# Patient Record
Sex: Female | Born: 1990 | Race: Black or African American | Hispanic: No | Marital: Single | Smoking: Never smoker
Health system: Southern US, Community
[De-identification: ages and names within clinical notes are randomized; demographics above are authoritative.]

## PROBLEM LIST (undated history)

## (undated) ENCOUNTER — Ambulatory Visit (HOSPITAL_COMMUNITY): Admission: EM | Payer: Medicaid Other | Source: Home / Self Care

## (undated) ENCOUNTER — Ambulatory Visit (HOSPITAL_COMMUNITY): Admission: EM | Source: Home / Self Care

## (undated) ENCOUNTER — Inpatient Hospital Stay (HOSPITAL_COMMUNITY): Payer: Self-pay

## (undated) ENCOUNTER — Ambulatory Visit: Source: Home / Self Care

## (undated) ENCOUNTER — Emergency Department (HOSPITAL_BASED_OUTPATIENT_CLINIC_OR_DEPARTMENT_OTHER): Admission: EM | Payer: Medicaid Other | Source: Home / Self Care

## (undated) DIAGNOSIS — E876 Hypokalemia: Secondary | ICD-10-CM

## (undated) DIAGNOSIS — B9689 Other specified bacterial agents as the cause of diseases classified elsewhere: Secondary | ICD-10-CM

## (undated) DIAGNOSIS — A549 Gonococcal infection, unspecified: Secondary | ICD-10-CM

## (undated) DIAGNOSIS — Z9884 Bariatric surgery status: Secondary | ICD-10-CM

## (undated) DIAGNOSIS — R6889 Other general symptoms and signs: Secondary | ICD-10-CM

## (undated) DIAGNOSIS — K117 Disturbances of salivary secretion: Secondary | ICD-10-CM

## (undated) DIAGNOSIS — N390 Urinary tract infection, site not specified: Secondary | ICD-10-CM

## (undated) DIAGNOSIS — R002 Palpitations: Secondary | ICD-10-CM

## (undated) DIAGNOSIS — Z3490 Encounter for supervision of normal pregnancy, unspecified, unspecified trimester: Secondary | ICD-10-CM

## (undated) DIAGNOSIS — Z6841 Body Mass Index (BMI) 40.0 and over, adult: Secondary | ICD-10-CM

## (undated) DIAGNOSIS — O09899 Supervision of other high risk pregnancies, unspecified trimester: Secondary | ICD-10-CM

## (undated) DIAGNOSIS — A749 Chlamydial infection, unspecified: Secondary | ICD-10-CM

## (undated) DIAGNOSIS — N83209 Unspecified ovarian cyst, unspecified side: Secondary | ICD-10-CM

## (undated) DIAGNOSIS — G35 Multiple sclerosis: Secondary | ICD-10-CM

## (undated) DIAGNOSIS — R42 Dizziness and giddiness: Secondary | ICD-10-CM

## (undated) DIAGNOSIS — R200 Anesthesia of skin: Secondary | ICD-10-CM

## (undated) DIAGNOSIS — Z658 Other specified problems related to psychosocial circumstances: Secondary | ICD-10-CM

## (undated) DIAGNOSIS — N760 Acute vaginitis: Secondary | ICD-10-CM

## (undated) DIAGNOSIS — E669 Obesity, unspecified: Secondary | ICD-10-CM

## (undated) DIAGNOSIS — Z2839 Other underimmunization status: Secondary | ICD-10-CM

## (undated) HISTORY — DX: Disturbances of salivary secretion: K11.7

## (undated) HISTORY — DX: Hypokalemia: E87.6

## (undated) HISTORY — DX: Body Mass Index (BMI) 40.0 and over, adult: Z684

## (undated) HISTORY — PX: WISDOM TOOTH EXTRACTION: SHX21

## (undated) HISTORY — DX: Other specified problems related to psychosocial circumstances: Z65.8

## (undated) HISTORY — DX: Other underimmunization status: Z28.39

## (undated) HISTORY — DX: Anesthesia of skin: R20.0

## (undated) HISTORY — DX: Dizziness and giddiness: R42

## (undated) HISTORY — DX: Unspecified ovarian cyst, unspecified side: N83.209

## (undated) HISTORY — DX: Other general symptoms and signs: R68.89

## (undated) HISTORY — DX: Bariatric surgery status: Z98.84

## (undated) HISTORY — DX: Encounter for supervision of normal pregnancy, unspecified, unspecified trimester: Z34.90

## (undated) HISTORY — DX: Palpitations: R00.2

## (undated) HISTORY — DX: Supervision of other high risk pregnancies, unspecified trimester: O09.899

## (undated) HISTORY — PX: GASTRIC BYPASS: SHX52

## (undated) HISTORY — DX: Multiple sclerosis: G35

---
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
## 2011-02-07 LAB — URINE MICROSCOPIC-ADD ON

## 2011-02-07 LAB — CBC
Hemoglobin: 13.2
MCHC: 33.9
Platelets: 249
RDW: 13

## 2011-02-07 LAB — RAPID STREP SCREEN (MED CTR MEBANE ONLY): Streptococcus, Group A Screen (Direct): NEGATIVE

## 2011-02-07 LAB — INFLUENZA A+B VIRUS AG-DIRECT(RAPID)
Inflenza A Ag: NEGATIVE
Influenza B Ag: NEGATIVE

## 2011-02-07 LAB — URINALYSIS, ROUTINE W REFLEX MICROSCOPIC
Glucose, UA: NEGATIVE
Hgb urine dipstick: NEGATIVE
Ketones, ur: 40 — AB
Protein, ur: NEGATIVE
pH: 5.5

## 2011-02-07 LAB — I-STAT 8, (EC8 V) (CONVERTED LAB)
Acid-base deficit: 2
Bicarbonate: 21.6
HCT: 42
Hemoglobin: 14.3
Operator id: 234501
Potassium: 3.7
Sodium: 138
TCO2: 23

## 2011-02-07 LAB — POCT PREGNANCY, URINE
Operator id: 294501
Preg Test, Ur: NEGATIVE

## 2011-02-25 LAB — URINE MICROSCOPIC-ADD ON

## 2011-02-25 LAB — GC/CHLAMYDIA PROBE AMP, GENITAL
Chlamydia, DNA Probe: NEGATIVE
GC Probe Amp, Genital: NEGATIVE

## 2011-02-25 LAB — URINALYSIS, ROUTINE W REFLEX MICROSCOPIC
Bilirubin Urine: NEGATIVE
Ketones, ur: NEGATIVE
Nitrite: NEGATIVE
Specific Gravity, Urine: 1.014
Urobilinogen, UA: 1
pH: 8

## 2011-02-25 LAB — POCT PREGNANCY, URINE
Operator id: 146091
Preg Test, Ur: NEGATIVE

## 2011-02-25 LAB — WET PREP, GENITAL: Yeast Wet Prep HPF POC: NONE SEEN

## 2011-05-22 ENCOUNTER — Emergency Department (INDEPENDENT_AMBULATORY_CARE_PROVIDER_SITE_OTHER)
Admission: EM | Admit: 2011-05-22 | Discharge: 2011-05-22 | Disposition: A | Discharge status: Home / Self Care | Payer: Self-pay | Source: Home / Self Care | Attending: Emergency Medicine | Admitting: Emergency Medicine

## 2011-05-22 ENCOUNTER — Encounter: Payer: Self-pay | Admitting: Cardiology

## 2011-05-22 DIAGNOSIS — J029 Acute pharyngitis, unspecified: Secondary | ICD-10-CM

## 2011-05-22 NOTE — ED Notes (Signed)
Pt reports sore throat for the past 2 days. Hurts to swallow. Face swelling noted in the morning. Pt states she has had this before and it is caused smoking too much. Has been smoking daily for the past 2 weeks.

## 2011-05-22 NOTE — ED Provider Notes (Signed)
History     CSN: [id number]  Arrival date & time 05/22/11  1047   First MD Initiated Contact with Patient 05/22/11 1052      Chief Complaint  Patient presents with  . Sore Throat    (Consider location/radiation/quality/duration/timing/severity/associated sxs/prior treatment) HPI Comments: Pt with ST x 2 days. Notes whitish material on left side. No rhinorrhea, N/V, rash,cough, wheeze, SOB, abd pain, ear pain, known sick contacts. Also c/o facial swelling in am. No eye complaints. Similar sx before when she was smoking. Started smoking daily over past 2 weeks. Has not tried anything for this.  Patient is a 21 y.o. female presenting with pharyngitis. The history is provided by the patient.  Sore Throat This is a new problem. The current episode started more than 2 days ago. The problem occurs constantly. The problem has not changed since onset.Pertinent negatives include no chest pain, no abdominal pain, no headaches and no shortness of breath. The symptoms are aggravated by swallowing. The symptoms are relieved by nothing. She has tried nothing for the symptoms.    History reviewed. No pertinent past medical history.  History reviewed. No pertinent past surgical history.  History reviewed. No pertinent family history.  History  Substance Use Topics  . Smoking status: Current Everyday Smoker    Types: Cigarettes  . Smokeless tobacco: Not on file  . Alcohol Use: No    OB History    Grav Para Term Preterm Abortions TAB SAB Ect Mult Living                  Review of Systems  Constitutional: Negative for fever.  HENT: Negative for congestion, rhinorrhea, mouth sores, trouble swallowing, neck pain and voice change.   Eyes: Negative.   Respiratory: Negative for shortness of breath and wheezing.   Cardiovascular: Negative for chest pain.  Gastrointestinal: Negative for nausea, vomiting and abdominal pain.  Neurological: Negative for headaches.    Allergies   Penicillins  Home Medications  No current outpatient prescriptions on file.  BP 119/70  Pulse 82  Temp(Src) 99.1 F (37.3 C) (Oral)  Resp 18  SpO2 100%  LMP 04/30/2011  Physical Exam  Nursing note and vitals reviewed. Constitutional: She is oriented to person, place, and time. She appears well-developed and well-nourished. No distress.  HENT:  Head: Normocephalic and atraumatic.  Right Ear: Tympanic membrane normal.  Left Ear: Tympanic membrane normal.  Nose: Mucosal edema and rhinorrhea present. Right sinus exhibits no maxillary sinus tenderness and no frontal sinus tenderness. Left sinus exhibits no maxillary sinus tenderness and no frontal sinus tenderness.  Mouth/Throat: Uvula is midline, oropharynx is clear and moist and mucous membranes are normal. No tonsillar abscesses.       Irritated, erythematous tonsils, tonsillith on L side which was removed with sterile qtip.   Eyes: EOM are normal. Pupils are equal, round, and reactive to light.  Neck: Normal range of motion.  Cardiovascular: Normal rate and regular rhythm.   Pulmonary/Chest: Effort normal and breath sounds normal.  Abdominal: She exhibits no distension.  Musculoskeletal: Normal range of motion.  Lymphadenopathy:       Shotty LN bilaterally  Neurological: She is alert and oriented to person, place, and time.  Skin: Skin is warm and dry.  Psychiatric: She has a normal mood and affect. Her behavior is normal. Judgment and thought content normal.    ED Course  Procedures (including critical care time)   Labs Reviewed  POCT RAPID STREP A [facility] CARE  ONLY)   No results found.   1. Pharyngitis       MDM  RAS neg. centor criteria 0/4. Most likely viral pharyngitis with further irritation with smoking. Advised pt to stop smoking. Pt states ready to quit.   Luiz Blare, MD 05/22/11 1155

## 2011-12-29 ENCOUNTER — Emergency Department (HOSPITAL_COMMUNITY)
Admission: EM | Admit: 2011-12-29 | Discharge: 2011-12-29 | Disposition: A | Discharge status: Home / Self Care | Payer: Self-pay | Attending: Emergency Medicine | Admitting: Emergency Medicine

## 2011-12-29 ENCOUNTER — Emergency Department (HOSPITAL_COMMUNITY): Payer: Self-pay

## 2011-12-29 ENCOUNTER — Encounter (HOSPITAL_COMMUNITY): Payer: Self-pay | Admitting: Emergency Medicine

## 2011-12-29 DIAGNOSIS — F17200 Nicotine dependence, unspecified, uncomplicated: Secondary | ICD-10-CM | POA: Insufficient documentation

## 2011-12-29 DIAGNOSIS — Z880 Allergy status to penicillin: Secondary | ICD-10-CM | POA: Insufficient documentation

## 2011-12-29 DIAGNOSIS — J069 Acute upper respiratory infection, unspecified: Secondary | ICD-10-CM | POA: Insufficient documentation

## 2011-12-29 DIAGNOSIS — Z881 Allergy status to other antibiotic agents status: Secondary | ICD-10-CM | POA: Insufficient documentation

## 2011-12-29 MED ORDER — GUAIFENESIN 100 MG/5ML PO LIQD: 100.0000 mg | ORAL | Status: AC | PRN

## 2011-12-29 MED ORDER — AZITHROMYCIN 250 MG PO TABS: 250.0000 mg | ORAL_TABLET | Freq: Every day | ORAL | Status: AC

## 2011-12-29 NOTE — ED Notes (Signed)
PT. REPORTS DRY COUGH WITH CHEST CONGESTION AND SOB FOR SEVERAL DAYS .

## 2011-12-29 NOTE — ED Notes (Signed)
Name called no answer X 1 

## 2011-12-29 NOTE — ED Notes (Signed)
Pt denies any questions upon discharge. 

## 2011-12-29 NOTE — ED Notes (Signed)
Pt called back but no answer from both triage tech and xray. Pt came up to nurse first desk to ask if she had been called. Xray called and notified

## 2011-12-29 NOTE — ED Notes (Signed)
Called to place in exam room, no answer x1.

## 2012-01-02 NOTE — ED Provider Notes (Signed)
History     CSN: [id number]  Arrival date & time 12/29/11  0107   First MD Initiated Contact with Patient 12/29/11 0216      Chief Complaint  Patient presents with  . Cough    (Consider location/radiation/quality/duration/timing/severity/associated sxs/prior treatment) Patient is a 21 y.o. female presenting with cough. The history is provided by the patient.  Cough This is a new problem. The current episode started more than 2 days ago. The problem occurs constantly. The problem has been gradually worsening. The cough is productive of purulent sputum. There has been no fever. Associated symptoms include chills and rhinorrhea. Pertinent negatives include no chest pain, no sweats, no weight loss, no ear pain, no sore throat, no shortness of breath, no wheezing and no eye redness. She has tried decongestants for the symptoms. The treatment provided mild relief. She is a smoker. Her past medical history is significant for pneumonia (txed for community acquired pna 2 yrs ago).    History reviewed. No pertinent past medical history.  History reviewed. No pertinent past surgical history.  No family history on file.  History  Substance Use Topics  . Smoking status: Current Everyday Smoker    Types: Cigarettes  . Smokeless tobacco: Not on file  . Alcohol Use: No    OB History    Grav Para Term Preterm Abortions TAB SAB Ect Mult Living                  Review of Systems  Constitutional: Positive for chills. Negative for fever, weight loss and unexpected weight change.  HENT: Positive for rhinorrhea. Negative for ear pain, sore throat, neck pain and neck stiffness.   Eyes: Negative for redness and visual disturbance.  Respiratory: Positive for cough. Negative for chest tightness, shortness of breath and wheezing.   Cardiovascular: Negative for chest pain, palpitations and leg swelling.  Gastrointestinal: Negative for nausea, vomiting and abdominal pain.  Neurological: Negative for  dizziness, syncope and weakness.    Allergies  Amoxicillin and Penicillins  Home Medications   Current Outpatient Rx  Name Route Sig Dispense Refill  . ASPIRIN-ACETAMINOPHEN-CAFFEINE 250-250-65 MG PO TABS Oral Take 2 tablets by mouth every 6 (six) hours as needed. toothache    . CARBOXYMETHYLCELLUL-GLYCERIN 1-0.25 % OP SOLN Both Eyes Place 1-2 drops into both eyes 3 (three) times daily as needed. Eye soreness and redness    . AZITHROMYCIN 250 MG PO TABS Oral Take 1 tablet (250 mg total) by mouth daily. Take first 2 tablets together, then 1 every day until finished. 6 tablet 0  . GUAIFENESIN 100 MG/5ML PO LIQD Oral Take 5-10 mLs (100-200 mg total) by mouth every 4 (four) hours as needed for cough. 60 mL 0    BP 120/77  Pulse 77  Temp 98.9 F (37.2 C) (Oral)  Resp 18  SpO2 100%  LMP 12/21/2011  Physical Exam  Nursing note and vitals reviewed. Constitutional: She appears well-developed and well-nourished. No distress.  HENT:  Head: Normocephalic and atraumatic.  Mouth/Throat: Oropharynx is clear and moist. No oropharyngeal exudate.       TMs nl bilat Sinuses non ttp  Eyes: EOM are normal. Pupils are equal, round, and reactive to light.  Neck: Normal range of motion. Neck supple.  Cardiovascular: Normal rate, regular rhythm and normal heart sounds.   Pulmonary/Chest: Effort normal and breath sounds normal. She exhibits no tenderness.  Abdominal: Soft. Bowel sounds are normal. There is no tenderness. There is no rebound and no guarding.  Musculoskeletal: Normal range of motion.  Lymphadenopathy:    She has no cervical adenopathy.  Neurological: She is alert.  Skin: Skin is warm and dry. She is not diaphoretic.  Psychiatric: She has a normal mood and affect.    ED Course  Procedures (including critical care time)  Labs Reviewed - No data to display No results found.   1. URI (upper respiratory infection)       MDM  Pt with hx pneumonia previously presents with  several days of productive cough. Reports that she feels similar to how she felt when dxed with pneumonia. Pt is nontoxic in appearance, is not hypoxic, tachypenic or tachycardic. CXR = mild bibasilar atelectasis. Will give coverage with zpack, antitussives. Reasons to return discussed.       Grant Fontana, PA-C 01/02/12 2125

## 2012-01-03 NOTE — ED Provider Notes (Signed)
Medical screening examination/treatment/procedure(s) were performed by non-physician practitioner and as supervising physician I was immediately available for consultation/collaboration.   Keyle Doby, MD 01/03/12 0655 

## 2012-01-19 ENCOUNTER — Emergency Department (INDEPENDENT_AMBULATORY_CARE_PROVIDER_SITE_OTHER)
Admission: EM | Admit: 2012-01-19 | Discharge: 2012-01-19 | Disposition: A | Discharge status: Home / Self Care | Payer: Self-pay | Source: Home / Self Care

## 2012-01-19 ENCOUNTER — Encounter (HOSPITAL_COMMUNITY): Payer: Self-pay | Admitting: Emergency Medicine

## 2012-01-19 DIAGNOSIS — N760 Acute vaginitis: Secondary | ICD-10-CM

## 2012-01-19 LAB — POCT URINALYSIS DIP (DEVICE)
Nitrite: NEGATIVE
Specific Gravity, Urine: 1.02 (ref 1.005–1.030)
Urobilinogen, UA: 0.2 mg/dL (ref 0.0–1.0)
pH: 6.5 (ref 5.0–8.0)

## 2012-01-19 LAB — WET PREP, GENITAL

## 2012-01-19 MED ORDER — METRONIDAZOLE 500 MG PO TABS: 500.0000 mg | ORAL_TABLET | Freq: Two times a day (BID) | ORAL | Status: AC

## 2012-01-19 NOTE — ED Provider Notes (Signed)
History     CSN: [id number]  Arrival date & time 01/19/12  1028   First MD Initiated Contact with Patient 01/19/12 1049      Chief Complaint  Patient presents with  . Vaginal Pain    vaginal pain and irritation    (Consider location/radiation/quality/duration/timing/severity/associated sxs/prior treatment) HPI Comments: C/o vaginal pain for 3-5 days, mild pelvic pain, mucosal irritation and odor. Denies vaginal discharge or itching. Feels iritated in vagina. No fever or chills, Denies GU sx's. No trauma. Sexually active.   History reviewed. No pertinent past medical history.  History reviewed. No pertinent past surgical history.  History reviewed. No pertinent family history.  History  Substance Use Topics  . Smoking status: Current Everyday Smoker    Types: Cigarettes  . Smokeless tobacco: Not on file  . Alcohol Use: No    OB History    Grav Para Term Preterm Abortions TAB SAB Ect Mult Living                  Review of Systems  Constitutional: Negative.   Respiratory: Negative.   Cardiovascular: Negative.   Gastrointestinal: Negative.   Genitourinary: Positive for vaginal discharge, genital sores, vaginal pain and pelvic pain. Negative for dysuria, frequency, hematuria, flank pain, difficulty urinating and menstrual problem.  Musculoskeletal: Negative.   Neurological: Negative.     Allergies  Amoxicillin and Penicillins  Home Medications   Current Outpatient Rx  Name Route Sig Dispense Refill  . ASPIRIN-ACETAMINOPHEN-CAFFEINE 250-250-65 MG PO TABS Oral Take 2 tablets by mouth every 6 (six) hours as needed. toothache    . CARBOXYMETHYLCELLUL-GLYCERIN 1-0.25 % OP SOLN Both Eyes Place 1-2 drops into both eyes 3 (three) times daily as needed. Eye soreness and redness    . METRONIDAZOLE 500 MG PO TABS Oral Take 1 tablet (500 mg total) by mouth 2 (two) times daily. X 7 days 14 tablet 0    BP 122/92  Pulse 72  Temp 98.7 F (37.1 C) (Oral)  Resp 16  SpO2 100%   LMP 12/21/2011  Physical Exam  Constitutional: She appears well-developed and well-nourished. No distress.  Neck: Normal range of motion. Neck supple.  Cardiovascular: Normal rate and normal heart sounds.   Pulmonary/Chest: Effort normal and breath sounds normal.  Abdominal: Soft. She exhibits no distension and no mass. There is no tenderness. There is no rebound and no guarding.  Genitourinary:       Redundant vaginal walls and cervix is positioned posteriorly beyond the length of the speculum. Could not capture it or visualize the cervix. Scant spinn and cream colored discharge. Tenderness in introitus and vagina. There are several 1mm papules on the mucosal side of labia minora. No vessicles or crops.   Skin: Skin is warm and dry.  Psychiatric: She has a normal mood and affect.    ED Course  Procedures (including critical care time)  Labs Reviewed  POCT URINALYSIS DIP (DEVICE) - Abnormal; Notable for the following:    Hgb urine dipstick TRACE (*)     Leukocytes, UA SMALL (*)  Biochemical Testing Only. Please order routine urinalysis from main lab if confirmatory testing is needed.   All other components within normal limits  WET PREP, GENITAL - Abnormal; Notable for the following:    Yeast Wet Prep HPF POC FEW (*)     Trich, Wet Prep FEW (*)     Clue Cells Wet Prep HPF POC MANY (*)     WBC, Wet Prep HPF POC MODERATE (*)  All other components within normal limits  POCT PREGNANCY, URINE  HERPES SIMPLEX VIRUS CULTURE   No results found.   1. Vaginitis and vulvovaginitis       MDM  Vaginitis instructions Flagyl 500 bid for 7 . Obtained swabs including herpes and wet prep.  Will call results and treat any ppositives. Nothing in vagina for 1 week.        Hayden Rasmussen, NP 01/19/12 1330  Hayden Rasmussen, NP 01/19/12 [phone]

## 2012-01-19 NOTE — ED Provider Notes (Signed)
Medical screening examination/treatment/procedure(s) were performed by non-physician practitioner and as supervising physician I was immediately available for consultation/collaboration.  Raynald Blend, MD 01/19/12 [phone]

## 2012-01-19 NOTE — ED Notes (Addendum)
Pt c/o vaginal pain/irritation/pain with intercourse x 3 to 5 days. Pt states she felt bumps inside of vagina, ? Out break.

## 2012-01-20 ENCOUNTER — Telehealth (HOSPITAL_COMMUNITY): Payer: Self-pay | Admitting: *Deleted

## 2012-01-20 NOTE — ED Notes (Signed)
Herpes culture pending, Wet prep: few yeast, few trich, many clue cells, mod. WBC's.  Pt. adequately treated with Flagyl.  I called and left a message for pt. to call. Vassie Moselle 01/20/2012

## 2012-01-20 NOTE — ED Notes (Signed)
Pt. called back and verified x 2. Pt. given her wet prep results and herpes was still pending.  Pt. told she was adequately treated.  You need to notify your partner to be treated with Flagyl, no sex until you have finished your medication and your partner has been treated and to practice safe sex. You can get HIV testing at the [facility] STD clinic. I told pt. I would only call back if Herpes test was positive. Vassie Moselle 01/20/2012

## 2012-01-23 LAB — HERPES SIMPLEX VIRUS CULTURE

## 2012-04-01 ENCOUNTER — Encounter (HOSPITAL_COMMUNITY): Payer: Self-pay | Admitting: *Deleted

## 2012-04-01 ENCOUNTER — Emergency Department (HOSPITAL_COMMUNITY)
Admission: EM | Admit: 2012-04-01 | Discharge: 2012-04-02 | Disposition: A | Discharge status: Home / Self Care | Payer: Self-pay | Attending: Emergency Medicine | Admitting: Emergency Medicine

## 2012-04-01 DIAGNOSIS — F17200 Nicotine dependence, unspecified, uncomplicated: Secondary | ICD-10-CM | POA: Insufficient documentation

## 2012-04-01 DIAGNOSIS — F410 Panic disorder [episodic paroxysmal anxiety] without agoraphobia: Secondary | ICD-10-CM | POA: Insufficient documentation

## 2012-04-01 NOTE — ED Notes (Signed)
Pt states "I woke up out my sleep and I just don't feel right." pt reports hx of anxiety. Reports numbness to fingers and SOB. Reports occasional "sharp pains in my stomach."

## 2012-04-02 LAB — POCT PREGNANCY, URINE: Preg Test, Ur: NEGATIVE

## 2012-04-02 MED ORDER — LORAZEPAM 1 MG PO TABS: 0.5000 mg | ORAL_TABLET | Freq: Three times a day (TID) | ORAL | Status: DC | PRN

## 2012-04-02 NOTE — ED Provider Notes (Signed)
Medical screening examination/treatment/procedure(s) were performed by non-physician practitioner and as supervising physician I was immediately available for consultation/collaboration.   Orhan Mayorga H Jovian Lembcke, MD 04/02/12 0430 

## 2012-04-02 NOTE — ED Notes (Signed)
Pt requesting pregnancy test at time of discharge

## 2012-04-02 NOTE — ED Provider Notes (Signed)
History     CSN: [id number]  Arrival date & time 04/01/12  2303   First MD Initiated Contact with Patient 04/02/12 0018      Chief Complaint  Patient presents with  . Anxiety    (Consider location/radiation/quality/duration/timing/severity/associated sxs/prior treatment) HPI Comments: Patient with a history of anxiety was awakened with her heart going fast and feeling, numbness and tingling, to her fingers, and feeling as though she had to gasp for breath.  This has since resolved.  She is, concerned because she had an anxiety attack.  At work, approximately 2 weeks, ago, and she never gets from this close together, and she thinks that it may become a problem.  She has no primary care physician at this time is back to her baseline  Patient is a 21 y.o. female presenting with anxiety. The history is provided by the patient.  Anxiety This is a recurrent problem. The current episode started today. The problem has been resolved. Associated symptoms include numbness. Pertinent negatives include no chest pain, chills, coughing, fever, nausea, rash, vertigo, vomiting or weakness. Nothing aggravates the symptoms. She has tried nothing for the symptoms.    History reviewed. No pertinent past medical history.  History reviewed. No pertinent past surgical history.  History reviewed. No pertinent family history.  History  Substance Use Topics  . Smoking status: Current Every Day Smoker    Types: Cigarettes  . Smokeless tobacco: Not on file  . Alcohol Use: No    OB History    Grav Para Term Preterm Abortions TAB SAB Ect Mult Living                  Review of Systems  Constitutional: Negative for fever and chills.  Respiratory: Negative for cough and chest tightness.   Cardiovascular: Negative for chest pain.  Gastrointestinal: Negative for nausea and vomiting.  Genitourinary: Negative.   Skin: Negative for rash.  Neurological: Positive for numbness. Negative for dizziness,  vertigo and weakness.  Psychiatric/Behavioral: The patient is nervous/anxious.     Allergies  Amoxicillin and Penicillins  Home Medications  No current outpatient prescriptions on file.  BP 119/71  Pulse 96  Temp 98.6 F (37 C) (Oral)  Resp 18  Ht 5\' 5"  (1.651 m)  Wt 200 lb (90.719 kg)  BMI 33.28 kg/m2  SpO2 98%  LMP 03/16/2012  Physical Exam  Constitutional: She is oriented to person, place, and time. She appears well-developed and well-nourished.  HENT:  Head: Normocephalic.  Eyes: Pupils are equal, round, and reactive to light.  Neck: Normal range of motion.  Cardiovascular: Normal rate.   Pulmonary/Chest: Effort normal.  Musculoskeletal: Normal range of motion.  Neurological: She is alert and oriented to person, place, and time.  Skin: Skin is warm. No rash noted.    ED Course  Procedures (including critical care time)  Labs Reviewed - No data to display No results found.   1. Panic attack       MDM   Will Dc home with 5 .5mg  Ativan thatshe can use for further panic attacks until she can be seen by Mental Health         Arman Filter, NP 04/02/12 0037

## 2012-07-26 ENCOUNTER — Encounter (HOSPITAL_COMMUNITY): Payer: Self-pay | Admitting: Emergency Medicine

## 2012-07-26 ENCOUNTER — Emergency Department (HOSPITAL_COMMUNITY)
Admission: EM | Admit: 2012-07-26 | Discharge: 2012-07-26 | Disposition: A | Discharge status: Home / Self Care | Payer: Self-pay | Attending: Emergency Medicine | Admitting: Emergency Medicine

## 2012-07-26 DIAGNOSIS — F17200 Nicotine dependence, unspecified, uncomplicated: Secondary | ICD-10-CM | POA: Insufficient documentation

## 2012-07-26 DIAGNOSIS — R197 Diarrhea, unspecified: Secondary | ICD-10-CM | POA: Insufficient documentation

## 2012-07-26 DIAGNOSIS — N949 Unspecified condition associated with female genital organs and menstrual cycle: Secondary | ICD-10-CM | POA: Insufficient documentation

## 2012-07-26 DIAGNOSIS — R109 Unspecified abdominal pain: Secondary | ICD-10-CM | POA: Insufficient documentation

## 2012-07-26 DIAGNOSIS — R1110 Vomiting, unspecified: Secondary | ICD-10-CM | POA: Insufficient documentation

## 2012-07-26 LAB — URINALYSIS, ROUTINE W REFLEX MICROSCOPIC
Bilirubin Urine: NEGATIVE
Hgb urine dipstick: NEGATIVE
Ketones, ur: NEGATIVE mg/dL
Nitrite: NEGATIVE
Specific Gravity, Urine: 1.019 (ref 1.005–1.030)
pH: 5 (ref 5.0–8.0)

## 2012-07-26 LAB — WET PREP, GENITAL
Trich, Wet Prep: NONE SEEN
Yeast Wet Prep HPF POC: NONE SEEN

## 2012-07-26 MED ORDER — DICYCLOMINE HCL 20 MG PO TABS: 20.0000 mg | ORAL_TABLET | Freq: Two times a day (BID) | ORAL | Status: DC

## 2012-07-26 MED ORDER — TRAMADOL HCL 50 MG PO TABS: 50.0000 mg | ORAL_TABLET | Freq: Four times a day (QID) | ORAL | Status: DC | PRN

## 2012-07-26 NOTE — ED Provider Notes (Signed)
History     CSN: [id number]  Arrival date & time 07/26/12  1430   First MD Initiated Contact with Patient 07/26/12 1516      Chief Complaint  Patient presents with  . Abdominal Pain    (Consider location/radiation/quality/duration/timing/severity/associated sxs/prior treatment) HPI Comments: Patient comes to the ER for evaluation of lower abdominal and pelvic pain for 2 days. Patient reports that initially she had diarrhea, but that resolved and now she feels constipated. Patient says that she did have one episode of emesis, but has not had a continued nausea. She denies fever. There has not been any urinary frequency, dysuria. She denies vaginal bleeding and discharge.  Patient is a 22 y.o. female presenting with abdominal pain.  Abdominal Pain Associated symptoms: diarrhea and vomiting   Associated symptoms: no fever     History reviewed. No pertinent past medical history.  History reviewed. No pertinent past surgical history.  No family history on file.  History  Substance Use Topics  . Smoking status: Current Every Day Smoker    Types: Cigarettes  . Smokeless tobacco: Not on file  . Alcohol Use: No    OB History   Grav Para Term Preterm Abortions TAB SAB Ect Mult Living                  Review of Systems  Constitutional: Negative for fever.  Gastrointestinal: Positive for vomiting, abdominal pain and diarrhea.  Genitourinary: Negative.   All other systems reviewed and are negative.    Allergies  Amoxicillin and Penicillins  Home Medications  No current outpatient prescriptions on file.  BP 117/79  Pulse 60  Temp(Src) 98.1 F (36.7 C) (Oral)  Resp 16  SpO2 97%  Physical Exam  Constitutional: She is oriented to person, place, and time. She appears well-developed and well-nourished. No distress.  HENT:  Head: Normocephalic and atraumatic.  Right Ear: Hearing normal.  Nose: Nose normal.  Mouth/Throat: Oropharynx is clear and moist and mucous  membranes are normal.  Eyes: Conjunctivae and EOM are normal. Pupils are equal, round, and reactive to light.  Neck: Normal range of motion. Neck supple.  Cardiovascular: Normal rate, regular rhythm, S1 normal and S2 normal.  Exam reveals no gallop and no friction rub.   No murmur heard. Pulmonary/Chest: Effort normal and breath sounds normal. No respiratory distress. She exhibits no tenderness.  Abdominal: Soft. Normal appearance and bowel sounds are normal. There is no hepatosplenomegaly. There is no tenderness. There is no rebound, no guarding, no tenderness at McBurney's point and negative Murphy's sign. No hernia.  Genitourinary: Vagina normal and uterus normal. Cervix exhibits no motion tenderness and no friability. Right adnexum displays no mass and no tenderness. Left adnexum displays no mass and no tenderness.  Musculoskeletal: Normal range of motion.  Neurological: She is alert and oriented to person, place, and time. She has normal strength. No cranial nerve deficit or sensory deficit. Coordination normal. GCS eye subscore is 4. GCS verbal subscore is 5. GCS motor subscore is 6.  Skin: Skin is warm, dry and intact. No rash noted. No cyanosis.  Psychiatric: She has a normal mood and affect. Her speech is normal and behavior is normal. Thought content normal.    ED Course  Procedures (including critical care time)  Labs Reviewed  WET PREP, GENITAL - Abnormal; Notable for the following:    WBC, Wet Prep HPF POC MODERATE (*)    All other components within normal limits  GC/CHLAMYDIA PROBE AMP  URINALYSIS,  ROUTINE W REFLEX MICROSCOPIC  POCT PREGNANCY, URINE   No results found.   Diagnosis: Abdominal pain.    MDM  Patient comes to the ER for evaluation of lower abdominal pain. Patient's abdominal exam, however is benign. She is not expressing any tenderness. There is no sign of acute surgical process. Urinalysis was performed and there was no sign of urinary tract infection.  Pregnancy was negative. All of her vital signs are entirely normal. A pelvic exam was performed to further evaluate. She does not have cervical motion tenderness. Wet prep is negative, GC and Chlamydia pending. Patient did have some diarrhea at the onset of the symptoms, cramping likely secondary to this. Will treat symptomatically.        Gilda Crease, MD 07/26/12 [phone]

## 2012-07-26 NOTE — ED Notes (Signed)
2 days of lower abd pain states it feels like lower something stabbing her in her abd pain intermit. . No vaginal bleeding, no discharge, constipated, last bm 2 days ago.

## 2012-07-27 LAB — GC/CHLAMYDIA PROBE AMP
CT Probe RNA: NEGATIVE
GC Probe RNA: NEGATIVE

## 2012-08-15 ENCOUNTER — Encounter (HOSPITAL_COMMUNITY): Payer: Self-pay | Admitting: Emergency Medicine

## 2012-08-15 ENCOUNTER — Emergency Department (HOSPITAL_COMMUNITY)
Admission: EM | Admit: 2012-08-15 | Discharge: 2012-08-15 | Discharge status: Against Medical Advice | Payer: Self-pay | Attending: Emergency Medicine | Admitting: Emergency Medicine

## 2012-08-15 DIAGNOSIS — F17200 Nicotine dependence, unspecified, uncomplicated: Secondary | ICD-10-CM | POA: Insufficient documentation

## 2012-08-15 DIAGNOSIS — R109 Unspecified abdominal pain: Secondary | ICD-10-CM | POA: Insufficient documentation

## 2012-08-15 DIAGNOSIS — N898 Other specified noninflammatory disorders of vagina: Secondary | ICD-10-CM | POA: Insufficient documentation

## 2012-08-15 NOTE — ED Notes (Signed)
Pt states that she is not due to have her cycle until the 5th and this am she had a large amount of blood it soaked through, intermit pain to lower abd took 3 tylenol pm to help with pain,

## 2012-08-15 NOTE — ED Notes (Signed)
Pt not in waiting room x 1 

## 2012-08-15 NOTE — ED Notes (Signed)
Not in waiting room x 2 

## 2012-08-22 ENCOUNTER — Emergency Department (HOSPITAL_COMMUNITY): Payer: Self-pay

## 2012-08-22 ENCOUNTER — Encounter (HOSPITAL_COMMUNITY): Payer: Self-pay | Admitting: Family Medicine

## 2012-08-22 ENCOUNTER — Emergency Department (HOSPITAL_COMMUNITY)
Admission: EM | Admit: 2012-08-22 | Discharge: 2012-08-22 | Disposition: A | Discharge status: Home / Self Care | Payer: Self-pay | Attending: Emergency Medicine | Admitting: Emergency Medicine

## 2012-08-22 DIAGNOSIS — Z3202 Encounter for pregnancy test, result negative: Secondary | ICD-10-CM | POA: Insufficient documentation

## 2012-08-22 DIAGNOSIS — R110 Nausea: Secondary | ICD-10-CM | POA: Insufficient documentation

## 2012-08-22 DIAGNOSIS — R1013 Epigastric pain: Secondary | ICD-10-CM | POA: Insufficient documentation

## 2012-08-22 DIAGNOSIS — Z880 Allergy status to penicillin: Secondary | ICD-10-CM | POA: Insufficient documentation

## 2012-08-22 DIAGNOSIS — F17200 Nicotine dependence, unspecified, uncomplicated: Secondary | ICD-10-CM | POA: Insufficient documentation

## 2012-08-22 DIAGNOSIS — R059 Cough, unspecified: Secondary | ICD-10-CM | POA: Insufficient documentation

## 2012-08-22 DIAGNOSIS — J069 Acute upper respiratory infection, unspecified: Secondary | ICD-10-CM | POA: Insufficient documentation

## 2012-08-22 DIAGNOSIS — R52 Pain, unspecified: Secondary | ICD-10-CM | POA: Insufficient documentation

## 2012-08-22 DIAGNOSIS — Z8701 Personal history of pneumonia (recurrent): Secondary | ICD-10-CM | POA: Insufficient documentation

## 2012-08-22 DIAGNOSIS — IMO0001 Reserved for inherently not codable concepts without codable children: Secondary | ICD-10-CM | POA: Insufficient documentation

## 2012-08-22 DIAGNOSIS — R05 Cough: Secondary | ICD-10-CM | POA: Insufficient documentation

## 2012-08-22 NOTE — ED Notes (Signed)
Called lab sts POCT preg negative. System not crossing over. Radiology made aware.

## 2012-08-22 NOTE — ED Provider Notes (Signed)
History     CSN: [id number]  Arrival date & time 08/22/12  1650   First MD Initiated Contact with Patient 08/22/12 1727      Chief Complaint  Patient presents with  . Cough  . Chills  . Fever    (Consider location/radiation/quality/duration/timing/severity/associated sxs/prior treatment) HPI Comments: Patient comes in complaining of cough, fever, chills, and body aches for the past 5 days. It came on suddenly and is not improving. The fever is subjective. She states her cough is bad all the time - no worse at night. When asked if the cough is productive she said "yes, I'm bringing up cold" which she describes as clear spit. There is nothing that makes her symptoms better. She has tried children's tylenol and Nyquil which she took before going to work today. These were ineffective. No vomiting or diarrhea. She admits to abdominal pain and points to her epigastric area. When asked to describe it she states "it feels like I have a boo-boo". She has a history of pneumonia and is concerned she is going to decompensate into pneumonia again.   Patient is a 22 y.o. female presenting with cough and fever. The history is provided by the patient. No language interpreter was used.  Cough Cough characteristics:  Productive Sputum characteristics:  Clear and nondescript ("cold") Severity:  Moderate Onset quality:  Gradual Duration:  5 days Timing:  Constant Progression:  Worsening Chronicity:  New Smoker: yes   Context: not sick contacts   Relieved by:  Nothing Worsened by:  Nothing tried Ineffective treatments: nyquil taken before work  Associated symptoms: chills, fever (subjective) and myalgias   Associated symptoms: no chest pain and no shortness of breath   Fever Associated symptoms: chills, cough, myalgias and nausea   Associated symptoms: no chest pain, no diarrhea and no vomiting     History reviewed. No pertinent past medical history.  History reviewed. No pertinent past surgical  history.  History reviewed. No pertinent family history.  History  Substance Use Topics  . Smoking status: Current Every Day Smoker    Types: Cigarettes  . Smokeless tobacco: Not on file  . Alcohol Use: No    OB History   Grav Para Term Preterm Abortions TAB SAB Ect Mult Living                  Review of Systems  Constitutional: Positive for fever (subjective) and chills.  Respiratory: Positive for cough. Negative for shortness of breath.   Cardiovascular: Negative for chest pain.  Gastrointestinal: Positive for nausea and abdominal pain. Negative for vomiting, diarrhea and constipation.  Musculoskeletal: Positive for myalgias.  All other systems reviewed and are negative.    Allergies  Amoxicillin and Penicillins  Home Medications   Current Outpatient Rx  Name  Route  Sig  Dispense  Refill  . acetaminophen (TYLENOL) 325 MG tablet   Oral   Take 650 mg by mouth every 6 (six) hours as needed for pain.           BP 126/66  Pulse 94  Temp(Src) 98.3 F (36.8 C) (Oral)  SpO2 96%  LMP 08/15/2012  Physical Exam  Nursing note and vitals reviewed. Constitutional: She is oriented to person, place, and time. Vital signs are normal. She appears well-developed and well-nourished. No distress.  HENT:  Head: Normocephalic and atraumatic.  Right Ear: External ear normal.  Left Ear: External ear normal.  Nose: Nose normal.  Mouth/Throat: Oropharynx is clear and moist.  Eyes:  Conjunctivae are normal.  Neck: Normal range of motion.  Cardiovascular: Normal rate, regular rhythm and normal heart sounds.   Pulmonary/Chest: Effort normal and breath sounds normal. No stridor. No respiratory distress. She has no wheezes. She has no rales.  Abdominal: Soft. Normal appearance and bowel sounds are normal. She exhibits no distension. There is tenderness in the epigastric area. There is no rigidity, no rebound and no guarding.  Musculoskeletal: Normal range of motion.  Neurological:  She is alert and oriented to person, place, and time. She has normal strength.  Skin: Skin is warm and dry. She is not diaphoretic. No erythema.  Psychiatric: She has a normal mood and affect. Her behavior is normal.    ED Course  Procedures (including critical care time)  Labs Reviewed  POCT PREGNANCY, URINE   Dg Chest 2 View  08/22/2012  *RADIOLOGY REPORT*  Clinical Data: Cough, chills, fever  CHEST - 2 VIEW  Comparison: Prior chest x-ray 12/29/2011  Findings: The lungs are well-aerated and free from pulmonary edema, focal airspace consolidation or pulmonary nodule.  Cardiac and mediastinal contours are within normal limits.  Stable nodular opacity in the right mid lung compared to 12/29/2011.  No pneumothorax, or pleural effusion. No acute osseous findings.  IMPRESSION:  No acute cardiopulmonary disease.   Original Report Authenticated By: Malachy Moan, M.D.      1. Viral upper respiratory infection       MDM  Patient presents today with cough and body aches. She is non-toxic, afebrile. CXR negative for acute pathology. Lungs CTA. O2 saturation remained 96% or greater on RA through ED course. No concern for pneumonia. Discussed that this was likely viral and antibiotics are not indicated. Discussed different OTC cough medications she can try. Follow up with PCP. Strict return precautions given. Vital signs stable for discharge. Patient / Family / Caregiver informed of clinical course, understand medical decision-making process, and agree with plan.        Mora Bellman, PA-C 08/23/12 0151

## 2012-08-22 NOTE — ED Notes (Signed)
Per pt cough, fever, chills, body aches and fever for 5 days.

## 2012-08-24 NOTE — ED Provider Notes (Signed)
Medical screening examination/treatment/procedure(s) were conducted as a shared visit with non-physician practitioner(s) and myself.  I personally evaluated the patient during the encounter  Pt well appearing, and in no distress, no need for antibiotics, stable for d/c  Joya Gaskins, MD 08/24/12 1617

## 2012-12-10 ENCOUNTER — Encounter (HOSPITAL_COMMUNITY): Payer: Self-pay | Admitting: Cardiology

## 2012-12-10 ENCOUNTER — Emergency Department (HOSPITAL_COMMUNITY)
Admission: EM | Admit: 2012-12-10 | Discharge: 2012-12-10 | Disposition: A | Discharge status: Home / Self Care | Payer: Self-pay | Attending: Emergency Medicine | Admitting: Emergency Medicine

## 2012-12-10 DIAGNOSIS — F17200 Nicotine dependence, unspecified, uncomplicated: Secondary | ICD-10-CM | POA: Insufficient documentation

## 2012-12-10 DIAGNOSIS — Z880 Allergy status to penicillin: Secondary | ICD-10-CM | POA: Insufficient documentation

## 2012-12-10 DIAGNOSIS — N760 Acute vaginitis: Secondary | ICD-10-CM

## 2012-12-10 DIAGNOSIS — Z3202 Encounter for pregnancy test, result negative: Secondary | ICD-10-CM | POA: Insufficient documentation

## 2012-12-10 LAB — CBC WITH DIFFERENTIAL/PLATELET
Basophils Absolute: 0 10*3/uL (ref 0.0–0.1)
Basophils Relative: 0 % (ref 0–1)
Eosinophils Absolute: 0.2 10*3/uL (ref 0.0–0.7)
Eosinophils Relative: 2 % (ref 0–5)
MCH: 29.5 pg (ref 26.0–34.0)
MCHC: 35.2 g/dL (ref 30.0–36.0)
MCV: 84 fL (ref 78.0–100.0)
Neutrophils Relative %: 66 % (ref 43–77)
Platelets: 274 10*3/uL (ref 150–400)
RDW: 12.9 % (ref 11.5–15.5)

## 2012-12-10 LAB — COMPREHENSIVE METABOLIC PANEL
ALT: 16 U/L (ref 0–35)
AST: 19 U/L (ref 0–37)
Albumin: 3.7 g/dL (ref 3.5–5.2)
Alkaline Phosphatase: 99 U/L (ref 39–117)
Calcium: 9.3 mg/dL (ref 8.4–10.5)
GFR calc Af Amer: >90 mL/min (ref >90)
Glucose, Bld: 75 mg/dL (ref 70–99)
Potassium: 3.8 mEq/L (ref 3.5–5.1)
Sodium: 139 mEq/L (ref 135–145)
Total Protein: 7.2 g/dL (ref 6.0–8.3)

## 2012-12-10 LAB — WET PREP, GENITAL: Trich, Wet Prep: NONE SEEN

## 2012-12-10 LAB — URINALYSIS, ROUTINE W REFLEX MICROSCOPIC
Bilirubin Urine: NEGATIVE
Glucose, UA: NEGATIVE mg/dL
Nitrite: NEGATIVE
Specific Gravity, Urine: 1.017 (ref 1.005–1.030)
pH: 6 (ref 5.0–8.0)

## 2012-12-10 LAB — POCT PREGNANCY, URINE: Preg Test, Ur: NEGATIVE

## 2012-12-10 LAB — URINE MICROSCOPIC-ADD ON

## 2012-12-10 MED ORDER — AZITHROMYCIN 250 MG PO TABS
1000.0000 mg | ORAL_TABLET | Freq: Once | ORAL | Status: AC
  Administered 2012-12-10: 1000 mg via ORAL
  Filled 2012-12-10: qty 4

## 2012-12-10 MED ORDER — ONDANSETRON 4 MG PO TBDP
4.0000 mg | ORAL_TABLET | Freq: Once | ORAL | Status: AC
  Administered 2012-12-10: 4 mg via ORAL
  Filled 2012-12-10: qty 1

## 2012-12-10 MED ORDER — ONDANSETRON HCL 4 MG PO TABS: 4.0000 mg | ORAL_TABLET | Freq: Four times a day (QID) | ORAL | Status: DC

## 2012-12-10 MED ORDER — METRONIDAZOLE 500 MG PO TABS
2000.0000 mg | ORAL_TABLET | Freq: Once | ORAL | Status: AC
  Administered 2012-12-10: 2000 mg via ORAL
  Filled 2012-12-10: qty 4

## 2012-12-10 MED ORDER — CEFTRIAXONE SODIUM 250 MG IJ SOLR
250.0000 mg | Freq: Once | INTRAMUSCULAR | Status: AC
  Administered 2012-12-10: 250 mg via INTRAMUSCULAR
  Filled 2012-12-10: qty 250

## 2012-12-10 NOTE — ED Provider Notes (Signed)
CSN: [id number]     Arrival date & time 12/10/12  1739 History     First MD Initiated Contact with Patient 12/10/12 2027     Chief Complaint  Patient presents with  . Abdominal Pain  . Abdominal Cramping   (Consider location/radiation/quality/duration/timing/severity/associated sxs/prior Treatment) HPI  Melanie Cisneros is a 22 y.o.female without any significant PMH presents to the ER with complaints of lower quadrant abd pain that started a few days ago. She reports that her period has been irregular and that she is over a week late on her period. She says that she has done an at home pregnancy test that was negative but she would like a pregnancy test and to let us know why her vagina has been irritated and period irregular. Denies pain, excessive bleeding, dysuria, hematuria, fevers, n/v/d, weakness.   History reviewed. No pertinent past medical history. History reviewed. No pertinent past surgical history. History reviewed. No pertinent family history. History  Substance Use Topics  . Smoking status: Current Every Day Smoker    Types: Cigarettes  . Smokeless tobacco: Not on file  . Alcohol Use: Yes   OB History   Grav Para Term Preterm Abortions TAB SAB Ect Mult Living                 Review of Systems  ROS is negative unless otherwise stated in the HPI  Allergies  Amoxicillin and Penicillins  Home Medications   Current Outpatient Rx  Name  Route  Sig  Dispense  Refill  . ondansetron (ZOFRAN) 4 MG tablet   Oral   Take 1 tablet (4 mg total) by mouth every 6 (six) hours.   12 tablet   0    BP 121/71  Pulse 65  Temp(Src) 98.3 F (36.8 C) (Oral)  Resp 16  SpO2 100%  LMP 10/28/2012 Physical Exam  Nursing note and vitals reviewed. Constitutional: She appears well-developed and well-nourished. No distress.  HENT:  Head: Normocephalic and atraumatic.  Eyes: Pupils are equal, round, and reactive to light.  Neck: Normal range of motion. Neck supple.   Cardiovascular: Normal rate and regular rhythm.   Pulmonary/Chest: Effort normal.  Abdominal: Soft.  Genitourinary: Uterus normal. Cervix exhibits discharge. Cervix exhibits no motion tenderness and no friability. Right adnexum displays no mass and no tenderness. Left adnexum displays no mass and no tenderness. No erythema, tenderness or bleeding around the vagina. No foreign body around the vagina. Vaginal discharge found.  Neurological: She is alert.  Skin: Skin is warm and dry.    ED Course   Procedures (including critical care time)  Labs Reviewed  WET PREP, GENITAL - Abnormal; Notable for the following:    Clue Cells Wet Prep HPF POC MANY (*)    WBC, Wet Prep HPF POC MANY (*)    All other components within normal limits  CBC WITH DIFFERENTIAL - Abnormal; Notable for the following:    WBC 13.0 (*)    Neutro Abs 8.6 (*)    All other components within normal limits  COMPREHENSIVE METABOLIC PANEL - Abnormal; Notable for the following:    Total Bilirubin 0.1 (*)    All other components within normal limits  URINALYSIS, ROUTINE W REFLEX MICROSCOPIC - Abnormal; Notable for the following:    Leukocytes, UA TRACE (*)    All other components within normal limits  GC/CHLAMYDIA PROBE AMP  URINE MICROSCOPIC-ADD ON  POCT PREGNANCY, URINE   No results found. 1. Bacterial vaginosis  MDM  + WBC and + clue cells with treat with Axithromycin, Rocephin, and Metronidazole.  [facility]  22 y.o.Kalimah D Cisneros's evaluation in the Emergency Department is complete. It has been determined that no acute conditions requiring further emergency intervention are present at this time. The patient/guardian have been advised of the diagnosis and plan. We have discussed signs and symptoms that warrant return to the ED, such as changes or worsening in symptoms.  Vital signs are stable at discharge. Filed Vitals:   12/10/12 2031  BP: 121/71  Pulse: 65  Temp: 98.3 F (36.8 C)   Resp: 16    Patient/guardian has voiced understanding and agreed to follow-up with the PCP or specialist.   Dorthula Matas, PA-C 12/10/12 2244

## 2012-12-10 NOTE — ED Notes (Signed)
Pt given gingerale, graham crackers, and a [place] sandwich.

## 2012-12-10 NOTE — ED Notes (Signed)
Pt reports lower quadrant abd pain that started a couple of days ago. Reports that she is almost a month last on her period, states that she has taken home pregnancy test that have been negative. Wants a blood test for possible pregnancy. Denies any pain with urination.

## 2012-12-10 NOTE — ED Provider Notes (Signed)
Medical screening examination/treatment/procedure(s) were performed by non-physician practitioner and as supervising physician I was immediately available for consultation/collaboration.   Christon Gallaway B. Bernette Mayers, MD 12/10/12 [phone]

## 2012-12-12 LAB — GC/CHLAMYDIA PROBE AMP
CT Probe RNA: NEGATIVE
GC Probe RNA: POSITIVE — AB

## 2012-12-13 NOTE — ED Notes (Signed)
+   Gonorrhea Patient treated with Rocephin and Zithromax. DHHS letter faxed 

## 2012-12-14 ENCOUNTER — Telehealth (HOSPITAL_COMMUNITY): Payer: Self-pay | Admitting: *Deleted

## 2012-12-14 NOTE — ED Notes (Signed)
Patient informed of positive results after id'd x 2 and informed of need to notify partner to be treated. 

## 2012-12-27 ENCOUNTER — Other Ambulatory Visit (HOSPITAL_COMMUNITY)
Admission: RE | Admit: 2012-12-27 | Discharge: 2012-12-27 | Disposition: A | Discharge status: Home / Self Care | Payer: Self-pay | Source: Ambulatory Visit | Attending: Family Medicine | Admitting: Family Medicine

## 2012-12-27 ENCOUNTER — Encounter (HOSPITAL_COMMUNITY): Payer: Self-pay | Admitting: Emergency Medicine

## 2012-12-27 ENCOUNTER — Emergency Department (INDEPENDENT_AMBULATORY_CARE_PROVIDER_SITE_OTHER)
Admission: EM | Admit: 2012-12-27 | Discharge: 2012-12-27 | Disposition: A | Discharge status: Home / Self Care | Payer: Self-pay | Source: Home / Self Care

## 2012-12-27 DIAGNOSIS — N760 Acute vaginitis: Secondary | ICD-10-CM | POA: Insufficient documentation

## 2012-12-27 DIAGNOSIS — Z113 Encounter for screening for infections with a predominantly sexual mode of transmission: Secondary | ICD-10-CM | POA: Insufficient documentation

## 2012-12-27 MED ORDER — AZITHROMYCIN 250 MG PO TABS
1000.0000 mg | ORAL_TABLET | Freq: Once | ORAL | Status: AC
  Administered 2012-12-27: 1000 mg via ORAL

## 2012-12-27 MED ORDER — CEFTRIAXONE SODIUM 250 MG IJ SOLR
INTRAMUSCULAR | Status: AC
  Filled 2012-12-27: qty 250

## 2012-12-27 MED ORDER — AZITHROMYCIN 250 MG PO TABS
ORAL_TABLET | ORAL | Status: AC
  Filled 2012-12-27: qty 4

## 2012-12-27 MED ORDER — LIDOCAINE HCL (PF) 1 % IJ SOLN
INTRAMUSCULAR | Status: AC
  Filled 2012-12-27: qty 5

## 2012-12-27 MED ORDER — CEFTRIAXONE SODIUM 250 MG IJ SOLR
250.0000 mg | Freq: Once | INTRAMUSCULAR | Status: AC
  Administered 2012-12-27: 250 mg via INTRAMUSCULAR

## 2012-12-27 NOTE — ED Notes (Signed)
Pt reports she will not wait 20 minutes after the Rocephin inj due to she has to be at work at 1550... Reports she's had this inj in the past and no problems

## 2012-12-27 NOTE — ED Notes (Signed)
Pt is here for STD exposure... Hx gonorrhea and she believes this is what she has Sxs include: itchiness, irritation... Denies: vag d/c, urinary sxs, abd pain Alert w/no signs of acute distress.

## 2012-12-27 NOTE — ED Notes (Signed)
Dr. Denyse Amass has been notified of pt's departure

## 2012-12-27 NOTE — ED Provider Notes (Signed)
Melanie Cisneros is a 22 y.o. female who presents to Urgent Care today for vaginal irritation and discharge present for the last 3 days. Patient is concerned that she has gonorrhea as her symptoms are consistent with prior episodes of gonorrhea. She has had exposure to possible STD. She denies any fevers chills abdominal pain nausea vomiting or diarrhea. She has not tried any medications yet.   PMH reviewed. Healthy otherwise History  Substance Use Topics  . Smoking status: Current Every Day Smoker    Types: Cigarettes  . Smokeless tobacco: Not on file  . Alcohol Use: Yes   ROS as above Medications reviewed. Allergic to penicillin. However patient has had ceftriaxone before without any reaction No current facility-administered medications for this encounter.   Current Outpatient Prescriptions  Medication Sig Dispense Refill  . ondansetron (ZOFRAN) 4 MG tablet Take 1 tablet (4 mg total) by mouth every 6 (six) hours.  12 tablet  0    Exam:  BP 122/81  Pulse 83  Temp(Src) 98.6 F (37 C) (Oral)  Resp 20  SpO2 97%  LMP 10/28/2012 Gen: Well NAD HEENT: EOMI,  MMM Lungs: CTABL Nl WOB Heart: RRR no MRG Abd: NABS, NT, ND Exts: Non edematous BL  LE, warm and well perfused.  GYN: Normal external genitalia. Vaginal canal with white discharge. Normal-appearing cervix.  No results found for this or any previous visit (from the past 24 hour(s)). No results found.  Assessment and Plan: 22 y.o. female with vaginitis. Possible STD. Linda treat empirically with ceftriaxone and azithromycin. Vaginal secretions cytology for gonorrhea Chlamydia and wet prep. Followup as needed. Discussed warning signs or symptoms. Please see discharge instructions. Patient expresses understanding.    Rodolph Bong, MD 12/27/12 [phone]

## 2012-12-31 NOTE — ED Notes (Signed)
Patient c/o continued perinal Per MD authorization , pt may have Diflucan 150 mg, PO, x1 . Pt requested [facility], [address], left message for pharmacist

## 2012-12-31 NOTE — ED Notes (Signed)
Call from patient to discuss test reports

## 2013-02-09 ENCOUNTER — Other Ambulatory Visit (HOSPITAL_COMMUNITY)
Admission: RE | Admit: 2013-02-09 | Discharge: 2013-02-09 | Disposition: A | Discharge status: Home / Self Care | Payer: Medicaid Other | Source: Ambulatory Visit | Attending: Emergency Medicine | Admitting: Emergency Medicine

## 2013-02-09 ENCOUNTER — Encounter (HOSPITAL_COMMUNITY): Payer: Self-pay | Admitting: Emergency Medicine

## 2013-02-09 ENCOUNTER — Emergency Department (INDEPENDENT_AMBULATORY_CARE_PROVIDER_SITE_OTHER)
Admission: EM | Admit: 2013-02-09 | Discharge: 2013-02-09 | Disposition: A | Discharge status: Home / Self Care | Payer: Medicaid Other | Source: Home / Self Care

## 2013-02-09 DIAGNOSIS — Z113 Encounter for screening for infections with a predominantly sexual mode of transmission: Secondary | ICD-10-CM | POA: Insufficient documentation

## 2013-02-09 DIAGNOSIS — R102 Pelvic and perineal pain: Secondary | ICD-10-CM

## 2013-02-09 DIAGNOSIS — N760 Acute vaginitis: Secondary | ICD-10-CM | POA: Insufficient documentation

## 2013-02-09 DIAGNOSIS — N644 Mastodynia: Secondary | ICD-10-CM

## 2013-02-09 DIAGNOSIS — N949 Unspecified condition associated with female genital organs and menstrual cycle: Secondary | ICD-10-CM

## 2013-02-09 LAB — POCT URINALYSIS DIP (DEVICE)
Bilirubin Urine: NEGATIVE
Ketones, ur: NEGATIVE mg/dL
Leukocytes, UA: NEGATIVE
Nitrite: NEGATIVE
Protein, ur: NEGATIVE mg/dL

## 2013-02-09 LAB — POCT PREGNANCY, URINE: Preg Test, Ur: NEGATIVE

## 2013-02-09 MED ORDER — IBUPROFEN 600 MG PO TABS: 600.0000 mg | ORAL_TABLET | Freq: Four times a day (QID) | ORAL | Status: DC | PRN

## 2013-02-09 NOTE — ED Provider Notes (Signed)
CSN: [id number]     Arrival date & time 02/09/13  1746 History   None    Chief Complaint  Patient presents with  . Abdominal Pain  . Breast Pain   (Consider location/radiation/quality/duration/timing/severity/associated sxs/prior Treatment) HPI  "Just don't feel right": started 7 days ago. Breasts feels larger than L and larger than nml. Feel a little uncomfortable. Vagina irritated and maybe a little itchy but unable to explain any further. Last intercourse 2 days ago. Similar feeling vaginally as at last appt here. Just feels off. No change in recent sexual habits. Denies fevers, n/v/d, CP, SOB, Palp, Szr. LMP 4-5 wks ago. No protection w/ intercourse. No dysuria. Just started walking briskly 2 wks ago and does not wear a supportive bra. ABX in mid Aug   History reviewed. No pertinent past medical history. History reviewed. No pertinent past surgical history. History reviewed. No pertinent family history. History  Substance Use Topics  . Smoking status: Never Smoker   . Smokeless tobacco: Not on file  . Alcohol Use: Yes   OB History   Grav Para Term Preterm Abortions TAB SAB Ect Mult Living                 Review of Systems  Constitutional: Negative for fever and fatigue.  Cardiovascular: Negative for chest pain.  Skin: Negative for rash and wound.  All other systems reviewed and are negative.    Allergies  Amoxicillin and Penicillins  Home Medications   Current Outpatient Rx  Name  Route  Sig  Dispense  Refill  . ibuprofen (ADVIL,MOTRIN) 600 MG tablet   Oral   Take 1 tablet (600 mg total) by mouth every 6 (six) hours as needed for pain.   30 tablet   0   . ondansetron (ZOFRAN) 4 MG tablet   Oral   Take 1 tablet (4 mg total) by mouth every 6 (six) hours.   12 tablet   0    BP 112/74  Pulse 101  Temp(Src) 98.5 F (36.9 C) (Oral)  Resp 20  SpO2 98% Physical Exam  Constitutional: She appears well-developed and well-nourished. No distress.  HENT:  Head:  Normocephalic and atraumatic.  Eyes: Pupils are equal, round, and reactive to light.  Neck: Normal range of motion. Neck supple.  Cardiovascular: Normal rate, normal heart sounds and intact distal pulses.   Pulmonary/Chest: Effort normal and breath sounds normal. No respiratory distress.  Large pendulous breasts. R slightly greater than L. Nml breast tissue. No erythema, swelling, induration. No nipple discharge  Abdominal: Soft. Bowel sounds are normal. She exhibits no distension.  Genitourinary:  Milky/cheesy vaginal discharge no cervical motion tenderness  Musculoskeletal: Normal range of motion.  Neurological: She is alert.  Skin: Skin is warm and dry. No rash noted. She is not diaphoretic. No erythema. No pallor.  Psychiatric: She has a normal mood and affect. Her behavior is normal. Judgment and thought content normal.    ED Course  Procedures (including critical care time) Labs Review Labs Reviewed  POCT URINALYSIS DIP (DEVICE)  POCT PREGNANCY, URINE  CERVICOVAGINAL ANCILLARY ONLY   Imaging Review No results found.  MDM   1. Breast tenderness in female   2. Vaginal pain    22 yo AAF w/ breast tenderness and vaginal irritation. Breast tenderness likely from typical changes w/ given menstrual cycle, vs mild trauma from significant increase in exercise routine w/o supportive bra. Not pregnant. Vaginal irritation likely secondary to yeast (given recent ABX) vs BV vs  STD. Cultures pending. 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 This may be contributing to how pt is feeling. Pt very amenable to our discussion and conclusions tonight in clinic.  - recommended supportive sports bra - Motrin 600mg  Q6 for relief - will contact if cultures/results come back positive.  - precautions given and all questions answered.  Shelly Flatten, MD Family Medicine PGY-3 02/09/2013, 8:16  PM      Ozella Rocks, MD 02/09/13 2016

## 2013-02-09 NOTE — ED Provider Notes (Signed)
Medical screening examination/treatment/procedure(s) were performed by a resident physician and as supervising physician I was immediately available for consultation/collaboration.  Leslee Home, M.D.  Reuben Likes, MD 02/09/13 2103

## 2013-02-09 NOTE — ED Notes (Signed)
Pt c/o multiple concerns Abd pain onset 2 days w/constipation Denies: fevers, diarrhea, n/v Also c/o bilateral breast discomfort/irritating onset 1 week denies nipple d/c Also c/o vag irritation onset 2-3 days Denies: vag d/c... Seen here at the [facility] for similar sxs and had neg STD screening Alert w/no signs of acute distress.

## 2013-02-12 MED ORDER — FLUCONAZOLE 150 MG PO TABS: 150.0000 mg | ORAL_TABLET | Freq: Once | ORAL | Status: DC

## 2013-02-12 NOTE — Progress Notes (Signed)
ADDENDUM  Pt called w/ Wet prep results.  Candida noted. Rx for diflucan called in and pt to pick up today  Shelly Flatten, MD Family Medicine PGY-3 02/12/2013, 12:46 PM

## 2013-02-14 NOTE — ED Notes (Signed)
GC/Chlamydia neg., Affirm: Candida pos., Gardnerella and Trich neg.  Pt. adequately treated with Diflucan. Marchelle Rinella M 02/14/2013  

## 2013-04-18 ENCOUNTER — Encounter (HOSPITAL_COMMUNITY): Payer: Self-pay | Admitting: Emergency Medicine

## 2013-04-18 DIAGNOSIS — Z3202 Encounter for pregnancy test, result negative: Secondary | ICD-10-CM | POA: Insufficient documentation

## 2013-04-18 DIAGNOSIS — B9689 Other specified bacterial agents as the cause of diseases classified elsewhere: Secondary | ICD-10-CM | POA: Insufficient documentation

## 2013-04-18 DIAGNOSIS — N72 Inflammatory disease of cervix uteri: Secondary | ICD-10-CM | POA: Insufficient documentation

## 2013-04-18 DIAGNOSIS — R0789 Other chest pain: Secondary | ICD-10-CM | POA: Insufficient documentation

## 2013-04-18 DIAGNOSIS — Z880 Allergy status to penicillin: Secondary | ICD-10-CM | POA: Insufficient documentation

## 2013-04-18 DIAGNOSIS — N760 Acute vaginitis: Secondary | ICD-10-CM | POA: Insufficient documentation

## 2013-04-18 DIAGNOSIS — J209 Acute bronchitis, unspecified: Secondary | ICD-10-CM | POA: Insufficient documentation

## 2013-04-18 DIAGNOSIS — A499 Bacterial infection, unspecified: Secondary | ICD-10-CM | POA: Insufficient documentation

## 2013-04-18 NOTE — ED Notes (Signed)
Pt. reports persistent productive cough with chest congestion for several days , denies fever or chills.  

## 2013-04-19 ENCOUNTER — Emergency Department (HOSPITAL_COMMUNITY)
Admission: EM | Admit: 2013-04-19 | Discharge: 2013-04-19 | Disposition: A | Discharge status: Home / Self Care | Payer: Self-pay | Attending: Emergency Medicine | Admitting: Emergency Medicine

## 2013-04-19 ENCOUNTER — Emergency Department (HOSPITAL_COMMUNITY): Payer: Self-pay

## 2013-04-19 DIAGNOSIS — J40 Bronchitis, not specified as acute or chronic: Secondary | ICD-10-CM

## 2013-04-19 DIAGNOSIS — B9689 Other specified bacterial agents as the cause of diseases classified elsewhere: Secondary | ICD-10-CM

## 2013-04-19 DIAGNOSIS — N72 Inflammatory disease of cervix uteri: Secondary | ICD-10-CM

## 2013-04-19 LAB — URINALYSIS W MICROSCOPIC + REFLEX CULTURE
Glucose, UA: NEGATIVE mg/dL
Ketones, ur: NEGATIVE mg/dL
Leukocytes, UA: NEGATIVE
Nitrite: NEGATIVE
Specific Gravity, Urine: 1.015 (ref 1.005–1.030)
pH: 5.5 (ref 5.0–8.0)

## 2013-04-19 LAB — WET PREP, GENITAL: Yeast Wet Prep HPF POC: NONE SEEN

## 2013-04-19 MED ORDER — AZITHROMYCIN 250 MG PO TABS
1000.0000 mg | ORAL_TABLET | Freq: Once | ORAL | Status: AC
  Administered 2013-04-19: 1000 mg via ORAL
  Filled 2013-04-19: qty 4

## 2013-04-19 MED ORDER — CEFTRIAXONE SODIUM 250 MG IJ SOLR
250.0000 mg | Freq: Once | INTRAMUSCULAR | Status: AC
  Administered 2013-04-19: 250 mg via INTRAMUSCULAR
  Filled 2013-04-19: qty 250

## 2013-04-19 MED ORDER — BENZONATATE 100 MG PO CAPS: 100.0000 mg | ORAL_CAPSULE | Freq: Three times a day (TID) | ORAL | Status: DC

## 2013-04-19 MED ORDER — STERILE WATER FOR INJECTION IJ SOLN
INTRAMUSCULAR | Status: AC
  Filled 2013-04-19: qty 10

## 2013-04-19 MED ORDER — GUAIFENESIN 100 MG/5ML PO LIQD: 100.0000 mg | ORAL | Status: DC | PRN

## 2013-04-19 MED ORDER — LIDOCAINE HCL (PF) 1 % IJ SOLN
INTRAMUSCULAR | Status: AC
  Administered 2013-04-19: 5 mL
  Filled 2013-04-19: qty 5

## 2013-04-19 MED ORDER — METRONIDAZOLE 500 MG PO TABS: 500.0000 mg | ORAL_TABLET | Freq: Two times a day (BID) | ORAL | Status: DC

## 2013-04-19 NOTE — ED Provider Notes (Signed)
Medical screening examination/treatment/procedure(s) were performed by non-physician practitioner and as supervising physician I was immediately available for consultation/collaboration.  Sari Cogan T Freja Faro, MD 04/19/13 0527 

## 2013-04-19 NOTE — ED Provider Notes (Signed)
CSN: [id number]     Arrival date & time 04/18/13  2327 History   First MD Initiated Contact with Patient 04/19/13 [phone]     Chief Complaint  Patient presents with  . Cough   (Consider location/radiation/quality/duration/timing/severity/associated sxs/prior Treatment) HPI Melanie Cisneros is a 22 y.o. female who presents emergency department complaining of cough and congestion for several days as well as possible exposure to STD. States the cough started approximately 3-4 days ago. States is productive with green sputum. States has history of pneumonia and states it feels the same. She denies any fever, chills. She denies any chest pain or shortness of breath. She's not a smoker no history of asthma. Has been taking Robitussin with no relief. Patient also states that she believes that her boyfriend is cheating on her and she has had unprotected intercourse with him. She reports increased vaginal discharge. Denies any abdominal pain or vaginal pain. States once STD check.  History reviewed. No pertinent past medical history. History reviewed. No pertinent past surgical history. No family history on file. History  Substance Use Topics  . Smoking status: Never Smoker   . Smokeless tobacco: Not on file  . Alcohol Use: Yes   OB History   Grav Para Term Preterm Abortions TAB SAB Ect Mult Living                 Review of Systems  Constitutional: Negative for fever and chills.  HENT: Positive for congestion. Negative for ear pain and sore throat.   Respiratory: Positive for cough and chest tightness. Negative for shortness of breath and wheezing.   Cardiovascular: Negative for chest pain, palpitations and leg swelling.  Gastrointestinal: Negative for nausea, vomiting, abdominal pain and diarrhea.  Genitourinary: Positive for vaginal discharge. Negative for dysuria, flank pain, vaginal bleeding, genital sores, vaginal pain and pelvic pain.  Musculoskeletal: Negative for arthralgias, myalgias,  neck pain and neck stiffness.  Skin: Negative for rash.  Neurological: Negative for dizziness, weakness and headaches.  All other systems reviewed and are negative.    Allergies  Amoxicillin and Penicillins  Home Medications   Current Outpatient Rx  Name  Route  Sig  Dispense  Refill  . fluconazole (DIFLUCAN) 150 MG tablet   Oral   Take 1 tablet (150 mg total) by mouth once. Repeat dose in 3 days   2 tablet   0   . ibuprofen (ADVIL,MOTRIN) 600 MG tablet   Oral   Take 1 tablet (600 mg total) by mouth every 6 (six) hours as needed for pain.   30 tablet   0   . ondansetron (ZOFRAN) 4 MG tablet   Oral   Take 1 tablet (4 mg total) by mouth every 6 (six) hours.   12 tablet   0    BP 122/74  Pulse 72  Temp(Src) 98.5 F (36.9 C) (Oral)  Resp 18  Ht 5\' 5"  (1.651 m)  Wt 226 lb (102.513 kg)  BMI 37.61 kg/m2  SpO2 99%  LMP 03/20/2013 Physical Exam  Nursing note and vitals reviewed. Constitutional: She is oriented to person, place, and time. She appears well-developed and well-nourished. No distress.  HENT:  Head: Normocephalic.  Eyes: Conjunctivae are normal.  Neck: Neck supple.  Cardiovascular: Normal rate, regular rhythm and normal heart sounds.   Pulmonary/Chest: Effort normal and breath sounds normal. No respiratory distress. She has no wheezes. She has no rales.  Coughing  Abdominal: Soft. Bowel sounds are normal. She exhibits no distension. There is  no tenderness. There is no rebound.  Genitourinary:  Normal external genitalia. white vaginal discharge. Cervix is normal. No cervical motion tenderness, no uterine tenderness, no adnexal tenderness  Musculoskeletal: She exhibits no edema.  Neurological: She is alert and oriented to person, place, and time.  Skin: Skin is warm and dry.  Psychiatric: She has a normal mood and affect. Her behavior is normal.    ED Course  Procedures (including critical care time) Labs Review Labs Reviewed  WET PREP, GENITAL -  Abnormal; Notable for the following:    Clue Cells Wet Prep HPF POC MODERATE (*)    WBC, Wet Prep HPF POC MANY (*)    All other components within normal limits  GC/CHLAMYDIA PROBE AMP  URINALYSIS W MICROSCOPIC + REFLEX CULTURE  POCT PREGNANCY, URINE   Imaging Review Dg Chest 2 View  04/19/2013   CLINICAL DATA:  Cough, fever, and shortness of breath for 4 days.  EXAM: CHEST  2 VIEW  COMPARISON:  08/22/2012  FINDINGS: Midline trachea. Normal heart size and mediastinal contours. No pleural effusion or pneumothorax. Clear lungs.  IMPRESSION: Normal chest.   Electronically Signed   By: Jeronimo Greaves M.D.   On: 04/19/2013 02:25    EKG Interpretation   None       MDM   1. Cervicitis   2. Bronchitis   3. BV (bacterial vaginosis)      Many white blood cells wet prep. Given patient's symptoms and possible exposure testing I will cover for gonorrhea chlamydia. Rocephin 250 mg given IM. Zithromax 1 g given by mouth. Will treat with Flagyl for BV. Chest x-ray is negative. Suspect viral bronchitis. Will treat with Robitussin and outpatient followup.  Filed Vitals:   04/19/13 0024 04/19/13 0030 04/19/13 0130 04/19/13 0324  BP:  107/71 110/68 103/50  Pulse: 72 72 86 61  Temp:    98.7 F (37.1 C)  TempSrc:    Oral  Resp: 18   16  Height:      Weight:      SpO2: 99% 100% 99% 99%      Lottie Mussel, PA-C 04/19/13 [phone]

## 2013-04-19 NOTE — ED Notes (Signed)
Pt would also like to be checked for STDs and have a pregnancy test while she is here, but her main concern is to make sure that she does not have walking pneumonia.

## 2013-06-03 ENCOUNTER — Encounter (HOSPITAL_COMMUNITY): Payer: Self-pay | Admitting: Emergency Medicine

## 2013-06-03 ENCOUNTER — Emergency Department (HOSPITAL_COMMUNITY)
Admission: EM | Admit: 2013-06-03 | Discharge: 2013-06-03 | Disposition: A | Discharge status: Home / Self Care | Payer: Self-pay | Attending: Emergency Medicine | Admitting: Emergency Medicine

## 2013-06-03 ENCOUNTER — Emergency Department (HOSPITAL_COMMUNITY): Payer: Self-pay

## 2013-06-03 DIAGNOSIS — K029 Dental caries, unspecified: Secondary | ICD-10-CM | POA: Insufficient documentation

## 2013-06-03 DIAGNOSIS — K047 Periapical abscess without sinus: Secondary | ICD-10-CM | POA: Insufficient documentation

## 2013-06-03 DIAGNOSIS — Z880 Allergy status to penicillin: Secondary | ICD-10-CM | POA: Insufficient documentation

## 2013-06-03 DIAGNOSIS — R259 Unspecified abnormal involuntary movements: Secondary | ICD-10-CM | POA: Insufficient documentation

## 2013-06-03 DIAGNOSIS — Z881 Allergy status to other antibiotic agents status: Secondary | ICD-10-CM | POA: Insufficient documentation

## 2013-06-03 DIAGNOSIS — Z79899 Other long term (current) drug therapy: Secondary | ICD-10-CM | POA: Insufficient documentation

## 2013-06-03 LAB — POCT I-STAT CREATININE: CREATININE: 0.9 mg/dL (ref 0.50–1.10)

## 2013-06-03 MED ORDER — CLINDAMYCIN HCL 150 MG PO CAPS: 300.0000 mg | ORAL_CAPSULE | Freq: Three times a day (TID) | ORAL | Status: DC

## 2013-06-03 MED ORDER — IBUPROFEN 400 MG PO TABS: 800.0000 mg | ORAL_TABLET | Freq: Once | ORAL | Status: DC

## 2013-06-03 MED ORDER — IOHEXOL 300 MG/ML  SOLN
75.0000 mL | Freq: Once | INTRAMUSCULAR | Status: AC | PRN
  Administered 2013-06-03: 75 mL via INTRAVENOUS

## 2013-06-03 MED ORDER — CLINDAMYCIN HCL 150 MG PO CAPS
300.0000 mg | ORAL_CAPSULE | Freq: Once | ORAL | Status: AC
  Administered 2013-06-03: 300 mg via ORAL
  Filled 2013-06-03: qty 2

## 2013-06-03 MED ORDER — OXYCODONE-ACETAMINOPHEN 5-325 MG PO TABS: 1.0000 | ORAL_TABLET | Freq: Four times a day (QID) | ORAL | Status: DC | PRN

## 2013-06-03 MED ORDER — OXYCODONE-ACETAMINOPHEN 5-325 MG PO TABS
2.0000 | ORAL_TABLET | Freq: Once | ORAL | Status: AC
  Administered 2013-06-03: 2 via ORAL
  Filled 2013-06-03: qty 2

## 2013-06-03 NOTE — Progress Notes (Signed)
   CARE MANAGEMENT ED NOTE 06/03/2013  Patient:  Melanie Cisneros, Melanie Cisneros   Account Number:  [id number]  Date Initiated:  06/03/2013  Documentation initiated by:  Laurena Slimmer  Subjective/Objective Assessment:   Received call from [place] regarding medications assistance. Pt was seen at [facility] ED today with dental pain with a dental abscess and was  prescribed clindamycin antibiotic.     Subjective/Objective Assessment Detail:   Pt does not have health insurance and cannot afford the antibiotic drug to treat dental abscess as prescribed by the EDP     Action/Plan:   MATCH   Action/Plan Detail:   Anticipated DC Date:  06/03/2013     Status Recommendation to Physician:   Result of Recommendation:  Agreed    Young Program    Choice offered to / List presented to:            Status of service:    ED Comments:   ED Comments Detail:  ED CM received call from [facility] on [facility] [address]. Pt reports to Pharmacist she is unable to afford the prescription for Clindaymycin 150 MG capsule Take 2 capsules (300 mg total) by mouth 3 (three) times daily . Pt eligible for MATCH. Enrolled in program, Letter printed and faxed to [employer]  [phone]. Received fax confirmation. No further ED CM need identified.

## 2013-06-03 NOTE — ED Notes (Signed)
Pt refusing to take ibuprofen and wanting something stronger.

## 2013-06-03 NOTE — Discharge Instructions (Signed)
Take clindamycin as prescribed as your CT shows multiple dental abscesses. These will need to be drained by a dentist. Refer to the resource guide for dental services in the area. You may take 600 mg ibuprofen every 6 hours as needed for pain control. Take Percocet as needed for breakthrough pain. Do not take Tylenol in addition to Percocet as there is already Tylenol and this medication. You may take 600 mg ibuprofen every 6 hours as needed for pain control. Return if symptoms worsen.  Dental Abscess A dental abscess is a collection of infected fluid (pus) from a bacterial infection in the inner part of the tooth (pulp). It usually occurs at the end of the tooth's root.  CAUSES   Severe tooth decay.  Trauma to the tooth that allows bacteria to enter into the pulp, such as a broken or chipped tooth. SYMPTOMS   Severe pain in and around the infected tooth.  Swelling and redness around the abscessed tooth or in the mouth or face.  Tenderness.  Pus drainage.  Bad breath.  Bitter taste in the mouth.  Difficulty swallowing.  Difficulty opening the mouth.  Nausea.  Vomiting.  Chills.  Swollen neck glands. DIAGNOSIS   A medical and dental history will be taken.  An examination will be performed by tapping on the abscessed tooth.  X-rays may be taken of the tooth to identify the abscess. TREATMENT The goal of treatment is to eliminate the infection. You may be prescribed antibiotic medicine to stop the infection from spreading. A root canal may be performed to save the tooth. If the tooth cannot be saved, it may be pulled (extracted) and the abscess may be drained.  HOME CARE INSTRUCTIONS  Only take over-the-counter or prescription medicines for pain, fever, or discomfort as directed by your caregiver.  Rinse your mouth (gargle) often with salt water ( tsp salt in 8 oz [250 ml] of warm water) to relieve pain or swelling.  Do not drive after taking pain medicine  (narcotics).  Do not apply heat to the outside of your face.  Return to your dentist for further treatment as directed. SEEK MEDICAL CARE IF:  Your pain is not helped by medicine.  Your pain is getting worse instead of better. SEEK IMMEDIATE MEDICAL CARE IF:  You have a fever or persistent symptoms for more than 2 3 days.  You have a fever and your symptoms suddenly get worse.  You have chills or a very bad headache.  You have problems breathing or swallowing.  You have trouble opening your mouth.  You have swelling in the neck or around the eye. Document Released: 05/02/2005 Document Revised: 01/25/2012 Document Reviewed: 08/10/2010 [facility] Patient Information 2014 [place], [place].  Emergency Department Resource Guide 1) Find a Doctor and Pay Out of Pocket Although you won't have to find out who is covered by your insurance plan, it is a good idea to ask around and get recommendations. You will then need to call the office and see if the doctor you have chosen will accept you as a new patient and what types of options they offer for patients who are self-pay. Some doctors offer discounts or will set up payment plans for their patients who do not have insurance, but you will need to ask so you aren't surprised when you get to your appointment.  2) Contact Your Local Health Department Not all health departments have doctors that can see patients for sick visits, but many do, so it is  worth a call to see if yours does. If you don't know where your local health department is, you can check in your phone book. The CDC also has a tool to help you locate your state's health department, and many state websites also have listings of all of their local health departments.  3) Find a [facility] If your illness is not likely to be very severe or complicated, you may want to try a walk in clinic. These are popping up all over the country in pharmacies, drugstores, and shopping centers.  They're usually staffed by nurse practitioners or physician assistants that have been trained to treat common illnesses and complaints. They're usually fairly quick and inexpensive. However, if you have serious medical issues or chronic medical problems, these are probably not your best option.  No Primary Care Doctor: - Call Health Connect at  [phone] - they can help you locate a primary care doctor that  accepts your insurance, provides certain services, etc. - Physician Referral Service- [phone]  Chronic Pain Problems: Organization         Address  Phone   Notes  [facility]  [phone] Patients need to be referred by their primary care doctor.   Medication Assistance: Organization         Address  Phone   Notes  [facility] Medication [facility] [address] [place], [place] [address] [phone] --Must be a resident of [facility] -- Must have NO insurance coverage whatsoever (no Medicaid/ Medicare, etc.) -- The pt. MUST have a primary care doctor that directs their care regularly and follows them in the community   MedAssist  [phone]   [employer]  [phone]    Agencies that provide inexpensive medical care: Organization         Address  Phone   Notes  [place]  [phone]   [facility]    [phone]   [facility] [place], [place] [address] [phone]   [place] [address] [place] [phone]   Planned Parenthood    [phone]   [facility]    [phone]   [place] [address], [place] Phone:  [phone], Fax:  [phone] Hours of Operation:  9 am - 6 pm, M-F.  Also accepts Medicaid/Medicare and self-pay.  [facility] for [place] [address], [address], [place] Phone: [phone], Fax: [phone]. Hours of Operation:  8:30 am - 5:30 pm, M-F.  Also accepts Medicaid and self-pay.  [facility] [address] [place] Phone: [phone]   [place], [place], [place] [phone], Ext. [phone] Mondays & Thursdays: 7-9 AM.  First 15 patients are seen on a first come, first serve basis.    [place] Providers:  Organization         Address  Phone   Notes  [facility] [address], [place] [phone] Also accepts self-pay patients.  [place], [place]  [phone]   [place], [address], [place] [phone]   [facility] [address], [place] [phone]   Lucianne Lei [address], [place]   [phone] Only accepts [employer] patients after they have their name applied to their card.   Self-Pay (no insurance) in [facility]:  Organization         Address  Phone   Notes  Sickle Cell Patients, [facility] Internal [facility] [place] [phone]   [facility] Urgent Care [place] [phone]   [facility] [place]  [place], [place], [place] [phone]   [facility]/Dr. Osei-Bonsu  [address] [place] or [address], [place] [phone] Phone number for both [place] and [place] locations is the same.  Urgent Medical and [facility] [address], [place] [phone]   [facility] [address], [place] or [address] [phone]   [facility] [address] [place] [phone], phone; [phone], fax Sees patients 1st and 3rd Saturday of every month.  Must not qualify for public or private insurance (i.e.  Medicaid, Medicare, [place] Health Choice, Veterans' Benefits)  Household income should be no more than 200% of the poverty level The clinic cannot treat you if you are pregnant or think you are pregnant  Sexually transmitted diseases are not treated at the clinic.    Dental Care: Organization         Address  Phone  Notes  [facility] Department of [facility] [place] [phone] Accepts children up to age 23 who are enrolled in [place] or [place]; pregnant women with a Medicaid card; and children who have applied for Medicaid or [place] Health Choice, but were declined, whose parents can pay a reduced fee at time of service.  [facility] Department of [facility]  [address], [place] [phone] Accepts children up to age 26 who are enrolled in [place] or [place]; pregnant women with a Medicaid card; and children who have applied for Medicaid or [place] Health Choice, but were declined, whose parents can pay a reduced fee at time of service.  [place] Adult [facility] Access PROGRAM  [place] [phone] Patients are seen by appointment only. Walk-ins are not accepted. [place] will see patients 64 years of age and older. Monday - Tuesday (8am-5pm) Most Wednesdays (8:30-5pm) $30 per visit, cash only  [facility] Adult [facility] Access PRO[facility]  [address], [facility] [phone] Patients are seen by appointment only. Walk-ins are not accepted. [place] will see patients 62 years of age and older. One Wednesday Evening (Monthly: Volunteer Based).  $30 per visit, cash only  [place]  [phone] for adults; Children under age 74, call Graduate Pediatric Dentistry at [phone]. Children aged 42-14, please call [phone] to request a pediatric application.  Dental services are provided in all areas of dental care including fillings,  crowns and bridges, complete and partial dentures, implants, gum treatment, root canals, and extractions. Preventive care is also provided. Treatment is provided to both adults and children. Patients are selected via a lottery and there is often a waiting list.   [facility] [address] [place]  [phone] [email or web address]   [facility] [address] [place], [place] [phone], Ext. [phone] Second and Fourth Thursday of  each month, opens at 6:30 AM; Clinic ends at 9 AM.  Patients are seen on a first-come first-served basis, and a limited number are seen during each clinic.   [facility]  [address] Hillard Danker [place], [place] [phone]   Eligibility Requirements You must have lived in [place], [place], or [place] counties for at least the last three months.   You cannot be eligible for state or federal sponsored [employer], including [employer], [place], or [employer].   You generally cannot be eligible for healthcare insurance through your employer.    How to apply: Eligibility screenings are held every Tuesday and Wednesday afternoon from 1:00 pm until 4:00 pm. You do not need an appointment for the interview!  [facility] [address] [place], [place]   [place]  [place] Department  [place]  [phone]    Behavioral Health Resources in the Community: Intensive Outpatient Programs Organization         Address  Phone  Notes  [place]. [address] [place], [place] [phone]   [facility] Outpatient [address] [place], [place]   ADS: Alcohol & Drug Svcs [address], [place], [place]   [place] [address],  [place], [place] or [phone]   Substance Abuse  Resources Organization         Address  Phone  Notes  Alcohol and Drug Services  [phone]   [place]  [phone]   The [place]   Chinita Pester  [phone]   Residential & Outpatient Substance Abuse Program  [phone]   Psychological Services Organization         Address  Phone  Notes  [facility]  [place]  [phone]   [place] [address], [place] or [phone]    Mobile Crisis Teams Organization         Address  Phone  Notes  [facility], Mobile Crisis Care Unit  [phone]   [facility] Psychotherapeutic Services  [address] [place], [place]   [place] Levels [address], [place] [phone]    Self-Help/Support Groups Organization         Address  Phone             Notes  [place]. of [place] - variety of support groups  [place] Call for more information  Narcotics Anonymous (NA), [facility]aring [facility] [address], [place]  2 meetings at this location   Special educational needs teacher         Address  Phone  Notes  ASAP Residential Treatment [place],    [place]  [phone]   [facility]  [address] [place] [phone], [place], [place]   [place], [place] [phone] Admissions: 8am-3pm M-F  Incentives Substance [place] [address]    [place], [place] [phone]   [facility] [address] Jadene Pierini [place], [place]   [facility] [address]  [place], [place]   [facility] Programs - Intensive Outpatient [address], [place], [place]   [facility] ([place].) [place].,  [place],  [place] [phone] or [phone]   [facility] Services (RTS) [address], [place], [place] Accepts Medicaid  [facility] [place] [address],  [place] [phone] Substance Abuse/Addiction Treatment   [facility] Organization         Address  Phone  Notes  [facility]  [phone]   Domenic Schwab, PhD [address] Arlis [place], [place]   [phone] or [phone]   [place], [place] [phone]   [address], [place], [place] [phone] Insurance/Medicaid/sponsorship through [facility] [place] and Families [address] [place]                                    [place], [place] [phone] [place] [address], [place] [phone]    Dr. Adele Schilder  [phone]   [facility] Dept. 1) [address], [place] 2) [place] 3)  [place] [address], [place] [phone]  [phone]   [place] [phone] or [phone] (After Hours)

## 2013-06-03 NOTE — ED Provider Notes (Signed)
CSN: [id number]     Arrival date & time 06/03/13  1437 History  This chart was scribed for non-physician practitioner, Antonietta Breach, PA-C,working with Babette Relic, MD, by Marlowe Kays, ED Scribe.  This patient was seen in room TR06C/TR06C and the patient's care was started at 3:55 PM.  Chief Complaint  Patient presents with  . Dental Pain   The history is provided by the patient. No language interpreter was used.   HPI Comments:  Melanie Cisneros is a 23 y.o. female who presents to the Emergency Department complaining of severe sharp, stabbing, and throbbing upper left dental pain that started last night. She states she cannot open her mouth as wide as she normally can. Pt states she has taken Tylenol, Ibuprofen, and BC Powder with no relief. She denies any ear discharge, neck stiffness, or inability to swallow. She reports h/o dental abscesses. She states she does not have a dentist.   History reviewed. No pertinent past medical history. History reviewed. No pertinent past surgical history. No family history on file. History  Substance Use Topics  . Smoking status: Never Smoker   . Smokeless tobacco: Not on file  . Alcohol Use: Yes   OB History   Grav Para Term Preterm Abortions TAB SAB Ect Mult Living                 Review of Systems  Constitutional: Negative for fever.  HENT: Positive for dental problem. Negative for ear discharge and trouble swallowing.   Musculoskeletal: Negative for neck stiffness.  All other systems reviewed and are negative.    Allergies  Amoxicillin and Penicillins  Home Medications   Current Outpatient Rx  Name  Route  Sig  Dispense  Refill  . Ascorbic Acid Buffered (BUFFERED C POWDER PO)   Oral   Take 1 packet by mouth every 6 (six) hours as needed (pain).         Marland Kitchen ibuprofen (ADVIL,MOTRIN) 200 MG tablet   Oral   Take 600 mg by mouth every 6 (six) hours as needed for moderate pain.         . clindamycin (CLEOCIN) 150 MG capsule  Oral   Take 2 capsules (300 mg total) by mouth 3 (three) times daily. May dispense as 150mg  capsules   60 capsule   0   . oxyCODONE-acetaminophen (PERCOCET/ROXICET) 5-325 MG per tablet   Oral   Take 1-2 tablets by mouth every 6 (six) hours as needed for severe pain.   11 tablet   0    Triage Vitals: BP 146/99  Pulse 95  Temp(Src) 98 F (36.7 C)  Resp 22  SpO2 97%  Physical Exam  Nursing note and vitals reviewed. Constitutional: She is oriented to person, place, and time. She appears well-developed and well-nourished. No distress.  HENT:  Head: Normocephalic and atraumatic.  Right Ear: External ear normal. No mastoid tenderness.  Left Ear: External ear normal. No mastoid tenderness.  Nose: Nose normal.  Mouth/Throat: Uvula is midline, oropharynx is clear and moist and mucous membranes are normal. No oral lesions. There is trismus in the jaw. Abnormal dentition. No uvula swelling.  +gingival erythema of R upper dentition extending from 1st premolar to 1st molar without swelling with TTP. Swelling of R upper buccal mucosa. +Trismus. Uvula midline and oropharynx clear. Patient tolerating secretions without difficulty.  Eyes: Conjunctivae and EOM are normal. Pupils are equal, round, and reactive to light. No scleral icterus.  Neck: Normal range of motion.  Neck supple.  No nuchal rigidity or meningismus.  Pulmonary/Chest: Effort normal. No respiratory distress.  Musculoskeletal: Normal range of motion.  Lymphadenopathy:    She has no cervical adenopathy.  Neurological: She is alert and oriented to person, place, and time.  Skin: Skin is warm and dry. No rash noted. She is not diaphoretic. No erythema. No pallor.  Psychiatric: She has a normal mood and affect. Her behavior is normal.    ED Course  Procedures (including critical care time) DIAGNOSTIC STUDIES: Oxygen Saturation is 97% on RA, normal by my interpretation.   COORDINATION OF CARE: 3:58 PM- Will X-Ray mouth and give  pain medication. Pt verbalizes understanding and agrees to plan.  Medications  oxyCODONE-acetaminophen (PERCOCET/ROXICET) 5-325 MG per tablet 2 tablet (2 tablets Oral Given 06/03/13 1612)  clindamycin (CLEOCIN) capsule 300 mg (300 mg Oral Given 06/03/13 1612)  iohexol (OMNIPAQUE) 300 MG/ML solution 75 mL (75 mLs Intravenous Contrast Given 06/03/13 1645)    Labs Review Labs Reviewed  POCT I-STAT CREATININE   Imaging Review Ct Maxillofacial W/cm  06/03/2013   CLINICAL DATA:  Severe upper left dental pain beginning 1 day ago.  EXAM: CT MAXILLOFACIAL WITH CONTRAST  TECHNIQUE: Multidetector CT imaging of the maxillofacial structures was performed with intravenous contrast. Multiplanar CT image reconstructions were also generated. A small metallic BB was placed on the right temple in order to reliably differentiate right from left.  CONTRAST:  75 mL OMNIPAQUE IOHEXOL 300 MG/ML  SOLN  COMPARISON:  None.  FINDINGS: Periapical lucencies are seen about 3 upper left teeth, numbers 13, 14 and 15. There is dehiscence of bone overlying tooth number 14 with overlying mucosal thickening in the left maxillary sinus. No facial abscess is identified. There is no fracture. Mandibular condyles are located. Mastoid air cells and middle ears are clear. Visualized intracranial contents appear normal.  IMPRESSION: Periapical abscesses teeth number 13,14 and 15 with dehiscence of bone superior to tooth number 14. This is likely the cause the patient's left maxillary sinus disease.   Electronically Signed   By: Inge Rise M.D.   On: 06/03/2013 17:29    EKG Interpretation   None       MDM   1. Dental abscess    Uncomplicated dental abscesses. Patient well and nontoxic appearing, hemodynamically stable, and afebrile. No nuchal rigidity or meningismus appreciated. Uvula midline without evidence of peritonsillar abscess. Patient tolerating secretions without difficulty or drooling. No oral bleeding or lesions. CT  imaging obtained as patient with mild trismus on exam. CT imaging significant for periapical abscesses of teeth 13-15 without osteomyelitis. No mastoid swelling, erythema, or tenderness bilaterally. Patient stable for discharge with dental followup, course of clindamycin, and percocet for breakthrough pain control. Return precautions provided and patient agreeable to plan with no unaddressed concerns.  I personally performed the services described in this documentation, which was scribed in my presence. The recorded information has been reviewed and is accurate.    Antonietta Breach, PA-C 06/03/13 1754

## 2013-06-04 ENCOUNTER — Encounter (HOSPITAL_COMMUNITY): Payer: Self-pay | Admitting: Emergency Medicine

## 2013-06-04 DIAGNOSIS — R221 Localized swelling, mass and lump, neck: Principal | ICD-10-CM

## 2013-06-04 DIAGNOSIS — R220 Localized swelling, mass and lump, head: Secondary | ICD-10-CM | POA: Insufficient documentation

## 2013-06-04 DIAGNOSIS — R51 Headache: Secondary | ICD-10-CM | POA: Insufficient documentation

## 2013-06-04 DIAGNOSIS — K047 Periapical abscess without sinus: Secondary | ICD-10-CM | POA: Insufficient documentation

## 2013-06-04 NOTE — ED Notes (Addendum)
Pt. reports progressing left facial swelling with pain , pt. seen here yesterday CT maxillofacial shows dental abscess , discharged with prescription Clindamycin and Percocet with no improvement . Airway intact / respirations unlabored.

## 2013-06-05 ENCOUNTER — Emergency Department (HOSPITAL_COMMUNITY)
Admission: EM | Admit: 2013-06-05 | Discharge: 2013-06-05 | Disposition: A | Discharge status: Home / Self Care | Payer: Self-pay | Attending: Emergency Medicine | Admitting: Emergency Medicine

## 2013-06-05 ENCOUNTER — Emergency Department (HOSPITAL_COMMUNITY): Payer: Self-pay

## 2013-06-05 ENCOUNTER — Emergency Department (HOSPITAL_COMMUNITY)
Admission: EM | Admit: 2013-06-05 | Discharge: 2013-06-05 | Discharge status: Against Medical Advice | Payer: Medicaid Other | Attending: Emergency Medicine | Admitting: Emergency Medicine

## 2013-06-05 ENCOUNTER — Encounter (HOSPITAL_COMMUNITY): Payer: Self-pay | Admitting: Emergency Medicine

## 2013-06-05 DIAGNOSIS — R509 Fever, unspecified: Secondary | ICD-10-CM | POA: Insufficient documentation

## 2013-06-05 DIAGNOSIS — K047 Periapical abscess without sinus: Secondary | ICD-10-CM | POA: Insufficient documentation

## 2013-06-05 DIAGNOSIS — Z880 Allergy status to penicillin: Secondary | ICD-10-CM | POA: Insufficient documentation

## 2013-06-05 DIAGNOSIS — Z792 Long term (current) use of antibiotics: Secondary | ICD-10-CM | POA: Insufficient documentation

## 2013-06-05 LAB — CBC WITH DIFFERENTIAL/PLATELET
BASOS ABS: 0 10*3/uL (ref 0.0–0.1)
BASOS PCT: 0 % (ref 0–1)
Eosinophils Absolute: 0 10*3/uL (ref 0.0–0.7)
Eosinophils Relative: 0 % (ref 0–5)
HCT: 39.2 % (ref 36.0–46.0)
HEMOGLOBIN: 13.3 g/dL (ref 12.0–15.0)
Lymphocytes Relative: 10 % — ABNORMAL LOW (ref 12–46)
Lymphs Abs: 1.8 10*3/uL (ref 0.7–4.0)
MCH: 28.5 pg (ref 26.0–34.0)
MCHC: 33.9 g/dL (ref 30.0–36.0)
MCV: 83.9 fL (ref 78.0–100.0)
Monocytes Absolute: 2.7 10*3/uL — ABNORMAL HIGH (ref 0.1–1.0)
Monocytes Relative: 15 % — ABNORMAL HIGH (ref 3–12)
NEUTROS ABS: 13.6 10*3/uL — AB (ref 1.7–7.7)
NEUTROS PCT: 75 % (ref 43–77)
Platelets: 279 10*3/uL (ref 150–400)
RBC: 4.67 MIL/uL (ref 3.87–5.11)
RDW: 13 % (ref 11.5–15.5)
WBC: 18.2 10*3/uL — ABNORMAL HIGH (ref 4.0–10.5)

## 2013-06-05 LAB — POCT I-STAT, CHEM 8
BUN: 4 mg/dL — AB (ref 6–23)
CHLORIDE: 102 meq/L (ref 96–112)
Calcium, Ion: 1.16 mmol/L (ref 1.12–1.23)
Creatinine, Ser: 0.9 mg/dL (ref 0.50–1.10)
GLUCOSE: 105 mg/dL — AB (ref 70–99)
HCT: 44 % (ref 36.0–46.0)
HEMOGLOBIN: 15 g/dL (ref 12.0–15.0)
POTASSIUM: 3.5 meq/L — AB (ref 3.7–5.3)
Sodium: 139 mEq/L (ref 137–147)
TCO2: 24 mmol/L (ref 0–100)

## 2013-06-05 MED ORDER — SODIUM CHLORIDE 0.9 % IV SOLN
Freq: Once | INTRAVENOUS | Status: AC
Start: 2013-06-05 — End: 2013-06-05
  Administered 2013-06-05: 06:00:00 via INTRAVENOUS

## 2013-06-05 MED ORDER — KETOROLAC TROMETHAMINE 30 MG/ML IJ SOLN
30.0000 mg | Freq: Once | INTRAMUSCULAR | Status: AC
  Administered 2013-06-05: 30 mg via INTRAVENOUS
  Filled 2013-06-05: qty 1

## 2013-06-05 MED ORDER — HYDROMORPHONE HCL PF 1 MG/ML IJ SOLN
1.0000 mg | Freq: Once | INTRAMUSCULAR | Status: AC
  Administered 2013-06-05: 1 mg via INTRAVENOUS
  Filled 2013-06-05: qty 1

## 2013-06-05 MED ORDER — CLINDAMYCIN PHOSPHATE 900 MG/50ML IV SOLN
900.0000 mg | Freq: Once | INTRAVENOUS | Status: AC
  Administered 2013-06-05: 900 mg via INTRAVENOUS
  Filled 2013-06-05: qty 50

## 2013-06-05 MED ORDER — MICONAZOLE NITRATE 2 % EX CREA
TOPICAL_CREAM | Freq: Two times a day (BID) | CUTANEOUS | Status: DC
  Administered 2013-06-05: 09:00:00 via TOPICAL
  Filled 2013-06-05: qty 14

## 2013-06-05 MED ORDER — CLINDAMYCIN PHOSPHATE 300 MG/50ML IV SOLN
300.0000 mg | Freq: Once | INTRAVENOUS | Status: AC
  Administered 2013-06-05: 300 mg via INTRAVENOUS
  Filled 2013-06-05: qty 50

## 2013-06-05 MED ORDER — SODIUM CHLORIDE 0.9 % IV SOLN
Freq: Once | INTRAVENOUS | Status: AC
Start: 2013-06-05 — End: 2013-06-05
  Administered 2013-06-05: 02:00:00 via INTRAVENOUS

## 2013-06-05 NOTE — ED Provider Notes (Signed)
CSN: [id number]     Arrival date & time 06/05/13  0021 History   First MD Initiated Contact with Patient 06/05/13 0034     Chief Complaint  Patient presents with  . Dental Pain  . Facial Swelling   (Consider location/radiation/quality/duration/timing/severity/associated sxs/prior Treatment) HPI Comments: She was seen on January 19, and diagnosed with apical abscesses in 4 locations.  She was started on clindamycin and Percocet, with a dental referral.  Presents tonight with worsening swelling, and pain, despite the use of the prescribed antibiotic, and pain.  Regime. Denies any visual changes, or pain with movement of her eye  Patient is a 23 y.o. female presenting with tooth pain. The history is provided by the patient.  Dental Pain Location:  Upper Quality:  Pulsating Severity:  Severe Onset quality:  Gradual Timing:  Constant Progression:  Worsening Chronicity:  Recurrent Context: abscess   Relieved by:  Nothing Ineffective treatments:  None tried Associated symptoms: facial pain, facial swelling and fever   Associated symptoms: no headaches     History reviewed. No pertinent past medical history. History reviewed. No pertinent past surgical history. History reviewed. No pertinent family history. History  Substance Use Topics  . Smoking status: Never Smoker   . Smokeless tobacco: Not on file  . Alcohol Use: Yes   OB History   Grav Para Term Preterm Abortions TAB SAB Ect Mult Living                 Review of Systems  Constitutional: Positive for fever.  HENT: Positive for dental problem and facial swelling.   Eyes: Negative for pain and visual disturbance.  Skin: Negative for wound.  Neurological: Negative for dizziness and headaches.  All other systems reviewed and are negative.    Allergies  Amoxicillin and Penicillins  Home Medications   Current Outpatient Rx  Name  Route  Sig  Dispense  Refill  . Aspirin-Caffeine 845-65 MG PACK   Oral   Take 1  Package by mouth every 4 (four) hours as needed (pain).         . clindamycin (CLEOCIN) 150 MG capsule   Oral   Take 300 mg by mouth 3 (three) times daily.         Marland Kitchen ibuprofen (ADVIL,MOTRIN) 200 MG tablet   Oral   Take 400 mg by mouth every 6 (six) hours as needed for moderate pain.          Marland Kitchen oxyCODONE-acetaminophen (PERCOCET/ROXICET) 5-325 MG per tablet   Oral   Take 1 tablet by mouth every 4 (four) hours as needed for severe pain.          BP 127/76  Pulse 95  Temp(Src) 100.8 F (38.2 C) (Oral)  Resp 20  SpO2 99%  LMP 04/29/2013 Physical Exam  Nursing note and vitals reviewed. Constitutional: She is oriented to person, place, and time. She appears well-developed and well-nourished.  HENT:  Head: Normocephalic.    Right Ear: External ear normal.  Left Ear: External ear normal.  Erythema and swelling  Eyes: Pupils are equal, round, and reactive to light.  Neck: Normal range of motion.  Cardiovascular: Normal rate and regular rhythm.   Pulmonary/Chest: Effort normal and breath sounds normal.  Musculoskeletal: Normal range of motion.  Lymphadenopathy:    She has cervical adenopathy.  Neurological: She is alert and oriented to person, place, and time.  Skin: Skin is warm. There is erythema.    ED Course  Procedures (including critical care  time) Labs Review Labs Reviewed  CBC WITH DIFFERENTIAL - Abnormal; Notable for the following:    WBC 18.2 (*)    Neutro Abs 13.6 (*)    Lymphocytes Relative 10 (*)    Monocytes Relative 15 (*)    Monocytes Absolute 2.7 (*)    All other components within normal limits  POCT I-STAT, CHEM 8 - Abnormal; Notable for the following:    Potassium 3.5 (*)    BUN 4 (*)    Glucose, Bld 105 (*)    All other components within normal limits   Imaging Review Ct Maxillofacial W/cm  06/03/2013   CLINICAL DATA:  Severe upper left dental pain beginning 1 day ago.  EXAM: CT MAXILLOFACIAL WITH CONTRAST  TECHNIQUE: Multidetector CT  imaging of the maxillofacial structures was performed with intravenous contrast. Multiplanar CT image reconstructions were also generated. A small metallic BB was placed on the right temple in order to reliably differentiate right from left.  CONTRAST:  75 mL OMNIPAQUE IOHEXOL 300 MG/ML  SOLN  COMPARISON:  None.  FINDINGS: Periapical lucencies are seen about 3 upper left teeth, numbers 13, 14 and 15. There is dehiscence of bone overlying tooth number 14 with overlying mucosal thickening in the left maxillary sinus. No facial abscess is identified. There is no fracture. Mandibular condyles are located. Mastoid air cells and middle ears are clear. Visualized intracranial contents appear normal.  IMPRESSION: Periapical abscesses teeth number 13,14 and 15 with dehiscence of bone superior to tooth number 14. This is likely the cause the patient's left maxillary sinus disease.   Electronically Signed   By: Inge Rise M.D.   On: 06/03/2013 17:29   Ct Maxillofacial Wo Cm  06/05/2013   CLINICAL DATA:  Left facial swelling.  Known periapical abscesses.  EXAM: CT MAXILLOFACIAL WITHOUT CONTRAST  TECHNIQUE: Multidetector CT imaging of the maxillofacial structures was performed. Multiplanar CT image reconstructions were also generated. A small metallic BB was placed on the right temple in order to reliably differentiate right from left.  COMPARISON:  CT MAXILLOFACIAL W/CM dated 06/03/2013  FINDINGS: Significant soft tissue swelling is noted overlying the left side of the maxilla and mandible, with diffuse soft tissue edema. Additional soft tissue swelling is seen about the left lower eyelid. No underlying soft tissue abscess is seen. Mildly prominent bilateral cervical nodes are seen, nonspecific in appearance.  Periapical abscesses are again seen involving the left second maxillary premolar and left first maxillary molar, with mildly worsened significant mucosal thickening at the left maxillary sinus. No definite  cortical breakthrough is characterized.  There is no evidence of fracture or dislocation. The maxilla and mandible appear intact. The nasal bone is unremarkable in appearance.  The orbits are intact bilaterally. The remaining visualized paranasal sinuses and mastoid air cells are well-aerated.  No significant soft tissue abnormalities are seen. The parapharyngeal fat planes are preserved. The nasopharynx, oropharynx and hypopharynx are unremarkable in appearance. The visualized portions of the valleculae and piriform sinuses are grossly unremarkable. The parotid glands are grossly unremarkable in appearance. The visualized portions of the brain are unremarkable in appearance.  IMPRESSION: 1. Significant soft tissue swelling overlying the left side of the maxilla and mandible, with diffuse soft tissue edema. Additional soft tissue swelling is seen about the left lower eyelid. No soft tissue abscess seen. Mildly prominent bilateral cervical nodes seen. 2. Periapical abscess is again noted involving the left second maxillary premolar and left first maxillary molar, with mildly worsened significant mucosal thickening at  the left maxillary sinus. No definite cortical breakthrough characterized.   Electronically Signed   By: Garald Balding M.D.   On: 06/05/2013 05:01    EKG Interpretation   None       MDM   1. Dental abscess     I discussed this patient with Dr. Buelah Manis, her request, that she call the office, at 8 AM to be seen.  She is to be kept n.p.o.. I will keep the patient.  In the emergency department at his now 6 AM I will give her additional IV fluids, and a second dose of clindamycin IV.  Just prior to her discharge to Dr. Dorian Heckle office.    Garald Balding, NP 06/05/13 [phone]

## 2013-06-05 NOTE — Discharge Instructions (Signed)
Go directly to Dr. Dorian Heckle  office.  Do not have anything to eat or drink during transportation

## 2013-06-05 NOTE — ED Provider Notes (Signed)
Medical screening examination/treatment/procedure(s) were conducted as a shared visit with non-physician practitioner(s) and myself.  I personally evaluated the patient during the encounter.  EKG Interpretation   None       Worsening pain and swelling.  Repeat CT scan demonstrates possible worsening of the periapical abscess but now with evidence of facial swelling likely facial cellulitis.  Patient's case was discussed with Dr. Erline Hau of oral surgery.  He will see the patient promptly in the office this morning.  N.p.o.  Ct Maxillofacial W/cm  06/03/2013   CLINICAL DATA:  Severe upper left dental pain beginning 1 day ago.  EXAM: CT MAXILLOFACIAL WITH CONTRAST  TECHNIQUE: Multidetector CT imaging of the maxillofacial structures was performed with intravenous contrast. Multiplanar CT image reconstructions were also generated. A small metallic BB was placed on the right temple in order to reliably differentiate right from left.  CONTRAST:  75 mL OMNIPAQUE IOHEXOL 300 MG/ML  SOLN  COMPARISON:  None.  FINDINGS: Periapical lucencies are seen about 3 upper left teeth, numbers 13, 14 and 15. There is dehiscence of bone overlying tooth number 14 with overlying mucosal thickening in the left maxillary sinus. No facial abscess is identified. There is no fracture. Mandibular condyles are located. Mastoid air cells and middle ears are clear. Visualized intracranial contents appear normal.  IMPRESSION: Periapical abscesses teeth number 13,14 and 15 with dehiscence of bone superior to tooth number 14. This is likely the cause the patient's left maxillary sinus disease.   Electronically Signed   By: Inge Rise M.D.   On: 06/03/2013 17:29   Ct Maxillofacial Wo Cm  06/05/2013   CLINICAL DATA:  Left facial swelling.  Known periapical abscesses.  EXAM: CT MAXILLOFACIAL WITHOUT CONTRAST  TECHNIQUE: Multidetector CT imaging of the maxillofacial structures was performed. Multiplanar CT image reconstructions were also  generated. A small metallic BB was placed on the right temple in order to reliably differentiate right from left.  COMPARISON:  CT MAXILLOFACIAL W/CM dated 06/03/2013  FINDINGS: Significant soft tissue swelling is noted overlying the left side of the maxilla and mandible, with diffuse soft tissue edema. Additional soft tissue swelling is seen about the left lower eyelid. No underlying soft tissue abscess is seen. Mildly prominent bilateral cervical nodes are seen, nonspecific in appearance.  Periapical abscesses are again seen involving the left second maxillary premolar and left first maxillary molar, with mildly worsened significant mucosal thickening at the left maxillary sinus. No definite cortical breakthrough is characterized.  There is no evidence of fracture or dislocation. The maxilla and mandible appear intact. The nasal bone is unremarkable in appearance.  The orbits are intact bilaterally. The remaining visualized paranasal sinuses and mastoid air cells are well-aerated.  No significant soft tissue abnormalities are seen. The parapharyngeal fat planes are preserved. The nasopharynx, oropharynx and hypopharynx are unremarkable in appearance. The visualized portions of the valleculae and piriform sinuses are grossly unremarkable. The parotid glands are grossly unremarkable in appearance. The visualized portions of the brain are unremarkable in appearance.  IMPRESSION: 1. Significant soft tissue swelling overlying the left side of the maxilla and mandible, with diffuse soft tissue edema. Additional soft tissue swelling is seen about the left lower eyelid. No soft tissue abscess seen. Mildly prominent bilateral cervical nodes seen. 2. Periapical abscess is again noted involving the left second maxillary premolar and left first maxillary molar, with mildly worsened significant mucosal thickening at the left maxillary sinus. No definite cortical breakthrough characterized.   Electronically Signed   By:  Garald Balding M.D.   On: 06/05/2013 05:01  I personally reviewed the imaging tests through PACS system I reviewed available ER/hospitalization records through the [facility], MD 06/05/13 [phone]

## 2013-06-05 NOTE — ED Notes (Signed)
Clindamycin Abx will be hung immediately prior to patient transport to periodontist, per request.

## 2013-06-05 NOTE — ED Notes (Signed)
Pt waiting to go to CT

## 2013-06-05 NOTE — ED Notes (Signed)
Pt returned from CT °

## 2013-06-05 NOTE — ED Notes (Signed)
Pt transported to CT ?

## 2013-06-05 NOTE — ED Notes (Signed)
Patient is alert and oriented x3.  She is complaining of dental pain and facial swelling on the left  Side of her face.  Currently she rates her pain 10 of 10.

## 2013-06-06 NOTE — ED Provider Notes (Signed)
Medical screening examination/treatment/procedure(s) were performed by non-physician practitioner and as supervising physician I was immediately available for consultation/collaboration.  Babette Relic, MD 06/06/13 [phone]

## 2013-11-21 ENCOUNTER — Encounter (HOSPITAL_COMMUNITY): Payer: Self-pay | Admitting: Emergency Medicine

## 2013-11-21 ENCOUNTER — Emergency Department (HOSPITAL_COMMUNITY)
Admission: EM | Admit: 2013-11-21 | Discharge: 2013-11-21 | Discharge status: Against Medical Advice | Payer: Self-pay | Attending: Emergency Medicine | Admitting: Emergency Medicine

## 2013-11-21 DIAGNOSIS — L293 Anogenital pruritus, unspecified: Secondary | ICD-10-CM | POA: Insufficient documentation

## 2013-11-21 DIAGNOSIS — N898 Other specified noninflammatory disorders of vagina: Secondary | ICD-10-CM | POA: Insufficient documentation

## 2013-11-21 NOTE — ED Notes (Signed)
Patient called x 1, but no answer.

## 2013-11-21 NOTE — ED Notes (Signed)
Called second time.  No answer.

## 2013-11-21 NOTE — ED Notes (Addendum)
Pt wearing sunglasses and talking on the phone while this writer is talking.  When asked what brought her to the ER, pt states, "Ain't you read the note."  Pt states that she has been having vaginal itching x 3 days and "brown stuff" that came out on a tissue this morning. Pt's friend is listening to loud rap music on her phone.  States that she "gotta be [place] here by 1530".  Pt then started singing rap music.

## 2013-11-21 NOTE — ED Notes (Signed)
Pt nowhere to be found.

## 2014-03-01 ENCOUNTER — Emergency Department (HOSPITAL_COMMUNITY)
Admission: EM | Admit: 2014-03-01 | Discharge: 2014-03-01 | Disposition: A | Discharge status: Home / Self Care | Payer: Self-pay | Attending: Emergency Medicine | Admitting: Emergency Medicine

## 2014-03-01 ENCOUNTER — Encounter (HOSPITAL_COMMUNITY): Payer: Self-pay | Admitting: Emergency Medicine

## 2014-03-01 DIAGNOSIS — Z880 Allergy status to penicillin: Secondary | ICD-10-CM | POA: Insufficient documentation

## 2014-03-01 DIAGNOSIS — B9689 Other specified bacterial agents as the cause of diseases classified elsewhere: Secondary | ICD-10-CM

## 2014-03-01 DIAGNOSIS — N760 Acute vaginitis: Secondary | ICD-10-CM | POA: Insufficient documentation

## 2014-03-01 DIAGNOSIS — L259 Unspecified contact dermatitis, unspecified cause: Secondary | ICD-10-CM | POA: Insufficient documentation

## 2014-03-01 DIAGNOSIS — Z3202 Encounter for pregnancy test, result negative: Secondary | ICD-10-CM | POA: Insufficient documentation

## 2014-03-01 LAB — URINALYSIS, ROUTINE W REFLEX MICROSCOPIC
BILIRUBIN URINE: NEGATIVE
Glucose, UA: NEGATIVE mg/dL
Hgb urine dipstick: NEGATIVE
KETONES UR: NEGATIVE mg/dL
NITRITE: NEGATIVE
PROTEIN: NEGATIVE mg/dL
Specific Gravity, Urine: 1.015 (ref 1.005–1.030)
Urobilinogen, UA: 0.2 mg/dL (ref 0.0–1.0)
pH: 5.5 (ref 5.0–8.0)

## 2014-03-01 LAB — WET PREP, GENITAL
TRICH WET PREP: NONE SEEN
Yeast Wet Prep HPF POC: NONE SEEN

## 2014-03-01 LAB — URINE MICROSCOPIC-ADD ON

## 2014-03-01 LAB — POC URINE PREG, ED: Preg Test, Ur: NEGATIVE

## 2014-03-01 MED ORDER — METRONIDAZOLE 500 MG PO TABS: 500.0000 mg | ORAL_TABLET | Freq: Two times a day (BID) | ORAL | Status: DC

## 2014-03-01 MED ORDER — DIPHENHYDRAMINE HCL 25 MG PO TABS: 25.0000 mg | ORAL_TABLET | Freq: Four times a day (QID) | ORAL | Status: DC | PRN

## 2014-03-01 MED ORDER — CETIRIZINE HCL 10 MG PO TABS: 10.0000 mg | ORAL_TABLET | Freq: Every day | ORAL | Status: DC

## 2014-03-01 MED ORDER — DIPHENHYDRAMINE HCL 25 MG PO CAPS
25.0000 mg | ORAL_CAPSULE | Freq: Once | ORAL | Status: AC
  Administered 2014-03-01: 25 mg via ORAL
  Filled 2014-03-01: qty 1

## 2014-03-01 NOTE — ED Notes (Signed)
Pt reports itching rash to vaginal area with discharge x 2 days. Denies pain. Pt also concerned she could possibly be pregnant. Denies urinary symptoms. Denies abdominal pain. LMP 10/1.

## 2014-03-01 NOTE — ED Provider Notes (Signed)
CSN: [id number]     Arrival date & time 03/01/14  1555 History   First MD Initiated Contact with Patient 03/01/14 1745     Chief Complaint  Patient presents with  . Vaginal Discharge     (Consider location/radiation/quality/duration/timing/severity/associated sxs/prior Treatment) HPI Comments: Patient presents to the emergency department with chief complaint of rash. She states that she noticed a rash on her chest, back, and around her vagina that started approximately 2 days ago. Contrary nursing note, she denies new vaginal discharge, but states that she has normal vaginal discharge. She denies any fevers, chills, nausea, or vomiting. She denies any dysuria. She states that she recently changed her living situation, and borrowed a friend's soap and shampoo. There are no aggravating or alleviating factors. She has not tried taking anything to alleviate her symptoms.  The history is provided by the patient. No language interpreter was used.    No past medical history on file. History reviewed. No pertinent past surgical history. No family history on file. History  Substance Use Topics  . Smoking status: Never Smoker   . Smokeless tobacco: Not on file  . Alcohol Use: Yes   OB History   Grav Para Term Preterm Abortions TAB SAB Ect Mult Living                 Review of Systems  Constitutional: Negative for fever and chills.  Respiratory: Negative for shortness of breath.   Cardiovascular: Negative for chest pain.  Gastrointestinal: Negative for nausea, vomiting, diarrhea and constipation.  Genitourinary: Negative for dysuria.  Skin: Positive for rash.  All other systems reviewed and are negative.     Allergies  Amoxicillin and Penicillins  Home Medications   Prior to Admission medications   Not on File   BP 121/67  Pulse 96  Temp(Src) 99.5 F (37.5 C) (Oral)  Resp 18  Ht 5\' 5"  (1.651 m)  Wt 220 lb (99.791 kg)  BMI 36.61 kg/m2  SpO2 97%  LMP 02/13/2014 Physical  Exam  Nursing note and vitals reviewed. Constitutional: She is oriented to person, place, and time. She appears well-developed and well-nourished.  HENT:  Head: Normocephalic and atraumatic.  Eyes: Conjunctivae and EOM are normal. Pupils are equal, round, and reactive to light.  Neck: Normal range of motion. Neck supple.  Cardiovascular: Normal rate and regular rhythm.  Exam reveals no gallop and no friction rub.   No murmur heard. Pulmonary/Chest: Effort normal and breath sounds normal. No respiratory distress. She has no wheezes. She has no rales. She exhibits no tenderness.  Abdominal: Soft. She exhibits no distension and no mass. There is no tenderness. There is no rebound and no guarding.  Genitourinary:  Pelvic exam chaperoned by female ER tech, no right or left adnexal tenderness, no uterine tenderness, moderate vaginal discharge, no bleeding, no CMT or friability, no foreign body, no injury to the external genitalia, no other significant findings   Musculoskeletal: Normal range of motion. She exhibits no edema and no tenderness.  Neurological: She is alert and oriented to person, place, and time.  Skin: Skin is warm and dry.  Psychiatric: She has a normal mood and affect. Her behavior is normal. Judgment and thought content normal.    ED Course  Procedures (including critical care time) Results for orders placed during the hospital encounter of 03/01/14  WET PREP, GENITAL      Result Value Ref Range   Yeast Wet Prep HPF POC NONE SEEN  NONE SEEN  Trich, Wet Prep NONE SEEN  NONE SEEN   Clue Cells Wet Prep HPF POC MANY (*) NONE SEEN   WBC, Wet Prep HPF POC TOO NUMEROUS TO COUNT (*) NONE SEEN  URINALYSIS, ROUTINE W REFLEX MICROSCOPIC      Result Value Ref Range   Color, Urine YELLOW  YELLOW   APPearance CLOUDY (*) CLEAR   Specific Gravity, Urine 1.015  1.005 - 1.030   pH 5.5  5.0 - 8.0   Glucose, UA NEGATIVE  NEGATIVE mg/dL   Hgb urine dipstick NEGATIVE  NEGATIVE   Bilirubin  Urine NEGATIVE  NEGATIVE   Ketones, ur NEGATIVE  NEGATIVE mg/dL   Protein, ur NEGATIVE  NEGATIVE mg/dL   Urobilinogen, UA 0.2  0.0 - 1.0 mg/dL   Nitrite NEGATIVE  NEGATIVE   Leukocytes, UA MODERATE (*) NEGATIVE  URINE MICROSCOPIC-ADD ON      Result Value Ref Range   Squamous Epithelial / LPF MANY (*) RARE   WBC, UA 0-2  <3 WBC/hpf   RBC / HPF 0-2  <3 RBC/hpf   Bacteria, UA FEW (*) RARE  POC URINE PREG, ED      Result Value Ref Range   Preg Test, Ur NEGATIVE  NEGATIVE   No results found.    EKG Interpretation None      MDM   Final diagnoses:  Bacterial vaginosis  Contact dermatitis    Patient with probable contact dermatitis, likely from new shampoo or soap, which she part from a friend. I have advised that she return to her regular soap. Will give the patient Benadryl in the ED, and discharge with antihistamine. No sign of cellulitis or infection. Patient is well-appearing. She is not in apparent distress.  Wet prep remarkable for many clue cells. Will treat for bacterial vaginosis. Will treat rash conservatively with Benadryl and Zyrtec. Recommend changing back to her regular soap. Patient is stable and ready for discharge.    Montine Circle, PA-C 03/01/14 1901

## 2014-03-01 NOTE — ED Notes (Signed)
Pt reports vaginal rash and discharge for a couple days. Pt states discharge has an odor. Pt reports last intercourse was a week ago. Pt denies any cramping or pain.

## 2014-03-01 NOTE — Discharge Instructions (Signed)
Contact Dermatitis Contact dermatitis is a reaction to certain substances that touch the skin. Contact dermatitis can be either irritant contact dermatitis or allergic contact dermatitis. Irritant contact dermatitis does not require previous exposure to the substance for a reaction to occur.Allergic contact dermatitis only occurs if you have been exposed to the substance before. Upon a repeat exposure, your body reacts to the substance.  CAUSES  Many substances can cause contact dermatitis. Irritant dermatitis is most commonly caused by repeated exposure to mildly irritating substances, such as:  Makeup.  Soaps.  Detergents.  Bleaches.  Acids.  Metal salts, such as nickel. Allergic contact dermatitis is most commonly caused by exposure to:  Poisonous plants.  Chemicals (deodorants, shampoos).  Jewelry.  Latex.  Neomycin in triple antibiotic cream.  Preservatives in products, including clothing. SYMPTOMS  The area of skin that is exposed may develop:  Dryness or flaking.  Redness.  Cracks.  Itching.  Pain or a burning sensation.  Blisters. With allergic contact dermatitis, there may also be swelling in areas such as the eyelids, mouth, or genitals.  DIAGNOSIS  Your caregiver can usually tell what the problem is by doing a physical exam. In cases where the cause is uncertain and an allergic contact dermatitis is suspected, a patch skin test may be performed to help determine the cause of your dermatitis. TREATMENT Treatment includes protecting the skin from further contact with the irritating substance by avoiding that substance if possible. Barrier creams, powders, and gloves may be helpful. Your caregiver may also recommend:  Steroid creams or ointments applied 2 times daily. For best results, soak the rash area in cool water for 20 minutes. Then apply the medicine. Cover the area with a plastic wrap. You can store the steroid cream in the refrigerator for a "chilly"  effect on your rash. That may decrease itching. Oral steroid medicines may be needed in more severe cases.  Antibiotics or antibacterial ointments if a skin infection is present.  Antihistamine lotion or an antihistamine taken by mouth to ease itching.  Lubricants to keep moisture in your skin.  Burow's solution to reduce redness and soreness or to dry a weeping rash. Mix one packet or tablet of solution in 2 cups cool water. Dip a clean washcloth in the mixture, wring it out a bit, and put it on the affected area. Leave the cloth in place for 30 minutes. Do this as often as possible throughout the day.  Taking several cornstarch or baking soda baths daily if the area is too large to cover with a washcloth. Harsh chemicals, such as alkalis or acids, can cause skin damage that is like a burn. You should flush your skin for 15 to 20 minutes with cold water after such an exposure. You should also seek immediate medical care after exposure. Bandages (dressings), antibiotics, and pain medicine may be needed for severely irritated skin.  HOME CARE INSTRUCTIONS  Avoid the substance that caused your reaction.  Keep the area of skin that is affected away from hot water, soap, sunlight, chemicals, acidic substances, or anything else that would irritate your skin.  Do not scratch the rash. Scratching may cause the rash to become infected.  You may take cool baths to help stop the itching.  Only take over-the-counter or prescription medicines as directed by your caregiver.  See your caregiver for follow-up care as directed to make sure your skin is healing properly. SEEK MEDICAL CARE IF:   Your condition is not better after 3  days of treatment.  You seem to be getting worse.  You see signs of infection such as swelling, tenderness, redness, soreness, or warmth in the affected area.  You have any problems related to your medicines. Document Released: 04/29/2000 Document Revised: 07/25/2011  Document Reviewed: 10/05/2010 [facility] Patient Information 2015 [place], [place]. This information is not intended to replace advice given to you by your health care provider. Make sure you discuss any questions you have with your health care provider. Bacterial Vaginosis Bacterial vaginosis is a vaginal infection that occurs when the normal balance of bacteria in the vagina is disrupted. It results from an overgrowth of certain bacteria. This is the most common vaginal infection in women of childbearing age. Treatment is important to prevent complications, especially in pregnant women, as it can cause a premature delivery. CAUSES  Bacterial vaginosis is caused by an increase in harmful bacteria that are normally present in smaller amounts in the vagina. Several different kinds of bacteria can cause bacterial vaginosis. However, the reason that the condition develops is not fully understood. RISK FACTORS Certain activities or behaviors can put you at an increased risk of developing bacterial vaginosis, including:  Having a new sex partner or multiple sex partners.  Douching.  Using an intrauterine device (IUD) for contraception. Women do not get bacterial vaginosis from toilet seats, bedding, swimming pools, or contact with objects around them. SIGNS AND SYMPTOMS  Some women with bacterial vaginosis have no signs or symptoms. Common symptoms include:  Grey vaginal discharge.  A fishlike odor with discharge, especially after sexual intercourse.  Itching or burning of the vagina and vulva.  Burning or pain with urination. DIAGNOSIS  Your health care provider will take a medical history and examine the vagina for signs of bacterial vaginosis. A sample of vaginal fluid may be taken. Your health care provider will look at this sample under a microscope to check for bacteria and abnormal cells. A vaginal pH test may also be done.  TREATMENT  Bacterial vaginosis may be treated with antibiotic  medicines. These may be given in the form of a pill or a vaginal cream. A second round of antibiotics may be prescribed if the condition comes back after treatment.  HOME CARE INSTRUCTIONS   Only take over-the-counter or prescription medicines as directed by your health care provider.  If antibiotic medicine was prescribed, take it as directed. Make sure you finish it even if you start to feel better.  Do not have sex until treatment is completed.  Tell all sexual partners that you have a vaginal infection. They should see their health care provider and be treated if they have problems, such as a mild rash or itching.  Practice safe sex by using condoms and only having one sex partner. SEEK MEDICAL CARE IF:   Your symptoms are not improving after 3 days of treatment.  You have increased discharge or pain.  You have a fever. MAKE SURE YOU:   Understand these instructions.  Will watch your condition.  Will get help right away if you are not doing well or get worse. FOR MORE INFORMATION  Centers for Disease Control and Prevention, Division of STD Prevention: Appraiser[employer]raud[employer] American Sexual Health Association (ASHA): www.ashastd.org  Document Released: 05/02/2005 Document Revised: 02/20/2013 Document Reviewed: 12/12/2012 [facility] Patient Information 2015 [place], [place]. This information is not intended to replace advice given to you by your health care provider. Make sure you discuss any questions you have with your health care provider.

## 2014-03-01 NOTE — ED Notes (Signed)
Discharge instructions reviewed with pt. Verbalized understanding.

## 2014-03-02 NOTE — ED Provider Notes (Signed)
Medical screening examination/treatment/procedure(s) were conducted as a shared visit with non-physician practitioner(s) and myself.  I personally evaluated the patient during the encounter.   EKG Interpretation None      I interviewed and examined the patient. Lungs are CTAB. Cardiac exam wnl. Abdomen soft.  PA to perform pelvic. Will tx for BV.    Pamella Pert, MD 03/02/14 0002

## 2014-03-03 LAB — GC/CHLAMYDIA PROBE AMP
CT PROBE, AMP APTIMA: POSITIVE — AB
GC Probe RNA: NEGATIVE

## 2014-03-04 ENCOUNTER — Telehealth (HOSPITAL_COMMUNITY): Payer: Self-pay

## 2014-03-04 NOTE — ED Notes (Signed)
Positive for chlamydia. Chart sent to edp office for review

## 2014-03-06 ENCOUNTER — Telehealth (HOSPITAL_COMMUNITY): Payer: Self-pay

## 2014-03-10 ENCOUNTER — Telehealth (HOSPITAL_BASED_OUTPATIENT_CLINIC_OR_DEPARTMENT_OTHER): Payer: Self-pay | Admitting: Emergency Medicine

## 2014-03-27 ENCOUNTER — Telehealth (HOSPITAL_COMMUNITY): Payer: Self-pay

## 2014-03-27 NOTE — ED Notes (Signed)
Unable to contact pt by mail or telephone. Unable to communicate lab results or treatment changes. 

## 2014-07-17 ENCOUNTER — Encounter (HOSPITAL_COMMUNITY): Payer: Self-pay | Admitting: Emergency Medicine

## 2014-07-17 ENCOUNTER — Emergency Department (HOSPITAL_COMMUNITY)
Admission: EM | Admit: 2014-07-17 | Discharge: 2014-07-17 | Discharge status: Against Medical Advice | Payer: Self-pay | Attending: Emergency Medicine | Admitting: Emergency Medicine

## 2014-07-17 DIAGNOSIS — N898 Other specified noninflammatory disorders of vagina: Secondary | ICD-10-CM | POA: Insufficient documentation

## 2014-07-17 DIAGNOSIS — Z202 Contact with and (suspected) exposure to infections with a predominantly sexual mode of transmission: Secondary | ICD-10-CM | POA: Insufficient documentation

## 2014-07-17 DIAGNOSIS — Z3202 Encounter for pregnancy test, result negative: Secondary | ICD-10-CM | POA: Insufficient documentation

## 2014-07-17 LAB — URINALYSIS, ROUTINE W REFLEX MICROSCOPIC
Bilirubin Urine: NEGATIVE
Glucose, UA: NEGATIVE mg/dL
KETONES UR: NEGATIVE mg/dL
Nitrite: NEGATIVE
PH: 5.5 (ref 5.0–8.0)
Protein, ur: NEGATIVE mg/dL
Specific Gravity, Urine: 1.013 (ref 1.005–1.030)
UROBILINOGEN UA: 0.2 mg/dL (ref 0.0–1.0)

## 2014-07-17 LAB — URINE MICROSCOPIC-ADD ON

## 2014-07-17 LAB — PREGNANCY, URINE: PREG TEST UR: NEGATIVE

## 2014-07-17 LAB — WET PREP, GENITAL: Trich, Wet Prep: NONE SEEN

## 2014-07-17 MED ORDER — AZITHROMYCIN 250 MG PO TABS
1000.0000 mg | ORAL_TABLET | Freq: Once | ORAL | Status: AC
  Administered 2014-07-17: 1000 mg via ORAL
  Filled 2014-07-17: qty 4

## 2014-07-17 MED ORDER — CEFTRIAXONE SODIUM 250 MG IJ SOLR
250.0000 mg | Freq: Once | INTRAMUSCULAR | Status: AC
  Administered 2014-07-17: 250 mg via INTRAMUSCULAR
  Filled 2014-07-17: qty 250

## 2014-07-17 MED ORDER — LIDOCAINE HCL (PF) 1 % IJ SOLN
5.0000 mL | Freq: Once | INTRAMUSCULAR | Status: AC
  Administered 2014-07-17: 5 mL via INTRADERMAL
  Filled 2014-07-17: qty 5

## 2014-07-17 NOTE — ED Notes (Signed)
Pt presents with lower abdominal pain for the past 2 days that occasionally radiates into her back.  Denies pain with urination, admits to some vaginal discharge.  Pt is concerned about having an STI.

## 2014-07-17 NOTE — ED Provider Notes (Signed)
CSN: [id number]     Arrival date & time 07/17/14  2104 History   First MD Initiated Contact with Patient 07/17/14 2135     Chief Complaint  Patient presents with  . Abdominal Pain     (Consider location/radiation/quality/duration/timing/severity/associated sxs/prior Treatment) HPI Comments: Patient is a 24 year old female presenting to the emergency department for evaluation of lower abdominal pain with occasional radiation to her low back over the last 2 days. She endorses associated white vaginal discharge and is concerned about possibly having a sexually transmitted disease. No medications tried prior to arrival. No modifying factors identified. Denies any fevers, chills, nausea, vomiting, diarrhea, urinary symptoms. Last menstrual period was 07/09/2014. No abdominal surgical history.   History reviewed. No pertinent past medical history. History reviewed. No pertinent past surgical history. No family history on file. History  Substance Use Topics  . Smoking status: Never Smoker   . Smokeless tobacco: Not on file  . Alcohol Use: Yes   OB History    No data available     Review of Systems  Gastrointestinal: Positive for abdominal pain. Negative for nausea, vomiting and diarrhea.  Genitourinary: Positive for vaginal discharge. Negative for vaginal bleeding.  Musculoskeletal: Positive for back pain.  All other systems reviewed and are negative.     Allergies  Amoxicillin and Penicillins  Home Medications   Prior to Admission medications   Medication Sig Start Date End Date Taking? Authorizing Provider  cetirizine (ZYRTEC ALLERGY) 10 MG tablet Take 1 tablet (10 mg total) by mouth daily. Patient not taking: Reported on 07/17/2014 03/01/14   Montine Circle, PA-C  diphenhydrAMINE (BENADRYL) 25 MG tablet Take 1 tablet (25 mg total) by mouth every 6 (six) hours as needed for itching (Rash). Patient not taking: Reported on 07/17/2014 03/01/14   Montine Circle, PA-C  metroNIDAZOLE  (FLAGYL) 500 MG tablet Take 1 tablet (500 mg total) by mouth 2 (two) times daily. Patient not taking: Reported on 07/17/2014 03/01/14   Montine Circle, PA-C   BP 135/92 mmHg  Pulse 75  Temp(Src) 98.4 F (36.9 C) (Oral)  Resp 19  Ht 5\' 5"  (1.651 m)  Wt 210 lb (95.255 kg)  BMI 34.95 kg/m2  SpO2 98%  LMP 07/09/2014 Physical Exam  Constitutional: She is oriented to person, place, and time. She appears well-developed and well-nourished. No distress.  HENT:  Head: Normocephalic and atraumatic.  Right Ear: External ear normal.  Left Ear: External ear normal.  Nose: Nose normal.  Mouth/Throat: Oropharynx is clear and moist.  Eyes: Conjunctivae are normal.  Neck: Normal range of motion. Neck supple.  Cardiovascular: Normal rate, regular rhythm and normal heart sounds.   Pulmonary/Chest: Effort normal and breath sounds normal. No respiratory distress.  Abdominal: Soft. Bowel sounds are normal. There is no tenderness.  Musculoskeletal: Normal range of motion.  Neurological: She is alert and oriented to person, place, and time.  Skin: Skin is warm and dry. She is not diaphoretic.  Psychiatric: She has a normal mood and affect.  Nursing note and vitals reviewed.  Exam performed by Baron Sane L,  exam chaperoned Date: 07/17/2014 Pelvic exam: normal external genitalia without evidence of trauma. VULVA: normal appearing vulva with no masses, tenderness or lesion. VAGINA: normal appearing vagina with normal color and discharge, no lesions. CERVIX: normal appearing cervix without lesions, cervical motion tenderness absent, cervical os closed with out purulent discharge; vaginal discharge - white, Wet prep and DNA probe for chlamydia and GC obtained.   ADNEXA: normal adnexa in size,  nontender and no masses UTERUS: uterus is normal size, shape, consistency and nontender.   ED Course  Procedures (including critical care time) Medications  cefTRIAXone (ROCEPHIN) injection 250 mg (250  mg Intramuscular Given 07/17/14 2255)  azithromycin (ZITHROMAX) tablet 1,000 mg (1,000 mg Oral Given 07/17/14 2255)  lidocaine (PF) (XYLOCAINE) 1 % injection 5 mL (5 mLs Intradermal Given 07/17/14 2255)    Labs Review Labs Reviewed  WET PREP, GENITAL - Abnormal; Notable for the following:    Yeast Wet Prep HPF POC FEW (*)    Clue Cells Wet Prep HPF POC FEW (*)    WBC, Wet Prep HPF POC MODERATE (*)    All other components within normal limits  URINALYSIS, ROUTINE W REFLEX MICROSCOPIC - Abnormal; Notable for the following:    Hgb urine dipstick TRACE (*)    Leukocytes, UA MODERATE (*)    All other components within normal limits  URINE MICROSCOPIC-ADD ON - Abnormal; Notable for the following:    Squamous Epithelial / LPF FEW (*)    All other components within normal limits  PREGNANCY, URINE  GC/CHLAMYDIA PROBE AMP ([place])    Imaging Review No results found.   EKG Interpretation None      MDM   Final diagnoses:  STD exposure    Filed Vitals:   07/17/14 2109  BP: 135/92  Pulse: 75  Temp: 98.4 F (36.9 C)  Resp: 19   Afebrile, NAD, non-toxic appearing, AAOx4.  Patient to be discharged with instructions to follow up with OBGYN. Pt understands GC/Chlamydia cultures pending and that they will need to inform all sexual partners within the last 6 months if results return positive. Pt has been treated prophylacticly with azithromycin and rocephin due to pts history, pelvic exam, and wet prep with increased WBCs. Pt advised that she will receive a call in 48 hours if the test is positive and to refrain from sexual activity for 48 hours. If the test is positive, pt is advised to refrain from sexual activity for 10 days for the medicine to take effect.  Pt not concerning for PID because hemodynamically stable and no cervical motion tenderness on pelvic exam.   Patient demanding to leave the emergency department. She does not want to wait for her wet prep results would like to leave.  She is living prior to completion of her workup. She was advised to stay. Patient understands this and still wishes to leave. Patient is stable at time of discharge          Harlow Mares, PA-C 07/18/14 [place]. Alvino Chapel, MD 07/21/14 [phone]

## 2014-07-18 LAB — GC/CHLAMYDIA PROBE AMP (~~LOC~~) NOT AT ARMC
CHLAMYDIA, DNA PROBE: NEGATIVE
Neisseria Gonorrhea: NEGATIVE

## 2014-07-19 ENCOUNTER — Encounter (HOSPITAL_COMMUNITY): Payer: Self-pay | Admitting: Emergency Medicine

## 2014-07-19 ENCOUNTER — Emergency Department (HOSPITAL_COMMUNITY)
Admission: EM | Admit: 2014-07-19 | Discharge: 2014-07-19 | Discharge status: Against Medical Advice | Payer: Self-pay | Attending: Emergency Medicine | Admitting: Emergency Medicine

## 2014-07-19 ENCOUNTER — Emergency Department (INDEPENDENT_AMBULATORY_CARE_PROVIDER_SITE_OTHER)
Admission: EM | Admit: 2014-07-19 | Discharge: 2014-07-19 | Disposition: A | Discharge status: Home / Self Care | Payer: Self-pay | Source: Home / Self Care | Attending: Family Medicine | Admitting: Family Medicine

## 2014-07-19 ENCOUNTER — Other Ambulatory Visit (HOSPITAL_COMMUNITY)
Admission: RE | Admit: 2014-07-19 | Discharge: 2014-07-19 | Disposition: A | Discharge status: Home / Self Care | Payer: Self-pay | Source: Ambulatory Visit | Attending: Family Medicine | Admitting: Family Medicine

## 2014-07-19 ENCOUNTER — Telehealth (HOSPITAL_BASED_OUTPATIENT_CLINIC_OR_DEPARTMENT_OTHER): Payer: Self-pay | Admitting: Emergency Medicine

## 2014-07-19 ENCOUNTER — Encounter (HOSPITAL_COMMUNITY): Payer: Self-pay | Admitting: Nurse Practitioner

## 2014-07-19 DIAGNOSIS — N760 Acute vaginitis: Secondary | ICD-10-CM | POA: Insufficient documentation

## 2014-07-19 DIAGNOSIS — L293 Anogenital pruritus, unspecified: Secondary | ICD-10-CM | POA: Insufficient documentation

## 2014-07-19 MED ORDER — FLUCONAZOLE 150 MG PO TABS: 150.0000 mg | ORAL_TABLET | Freq: Once | ORAL | Status: DC

## 2014-07-19 MED ORDER — METRONIDAZOLE 500 MG PO TABS: 500.0000 mg | ORAL_TABLET | Freq: Two times a day (BID) | ORAL | Status: DC

## 2014-07-19 NOTE — ED Notes (Signed)
She reports she was just here for STD check and treated with abx but continues to have vaginal irritation and itching. She spoke with someone at the hospital about her test results and states they told her to come get tested for herpes.

## 2014-07-19 NOTE — Discharge Instructions (Signed)

## 2014-07-19 NOTE — ED Notes (Addendum)
Patient c/o yeast infection following her antibiotic treatment for STDs and a small outbreak of bumps that she is worried are herpes. She has a discharge and vaginal itching. Patient is in NAD. Has not been sexually active since her last exam.

## 2014-07-19 NOTE — ED Provider Notes (Signed)
CSN: [id number]     Arrival date & time 07/19/14  1414 History   First MD Initiated Contact with Patient 07/19/14 1516     Chief Complaint  Patient presents with  . Vaginitis   (Consider location/radiation/quality/duration/timing/severity/associated sxs/prior Treatment) HPI      24 year old female presents complaining of vaginal itching and discharge. She notes that she was seen in the emergency department 3 days ago for possible STD. All of her tests are negative. She was treated with ceftriaxone and azithromycin. Yesterday she started to get some vaginal itching. She called the emergency department the told her to come here recent she did have a yeast infection. She also has some bumps on her vagina, she is worried about possibility of herpes and will like to be tested for that. No abdominal or pelvic pain or vaginal bleeding. She has not been sexually active since she was tested in the emergency department a few days ago  History reviewed. No pertinent past medical history. History reviewed. No pertinent past surgical history. No family history on file. History  Substance Use Topics  . Smoking status: Never Smoker   . Smokeless tobacco: Not on file  . Alcohol Use: Yes   OB History    No data available     Review of Systems  Genitourinary: Positive for vaginal discharge (and itching ). Negative for vaginal bleeding, vaginal pain and pelvic pain.  All other systems reviewed and are negative.   Allergies  Amoxicillin and Penicillins  Home Medications   Prior to Admission medications   Medication Sig Start Date End Date Taking? Authorizing Provider  cetirizine (ZYRTEC ALLERGY) 10 MG tablet Take 1 tablet (10 mg total) by mouth daily. Patient not taking: Reported on 07/17/2014 03/01/14   Montine Circle, PA-C  diphenhydrAMINE (BENADRYL) 25 MG tablet Take 1 tablet (25 mg total) by mouth every 6 (six) hours as needed for itching (Rash). Patient not taking: Reported on 07/17/2014 03/01/14    Montine Circle, PA-C  fluconazole (DIFLUCAN) 150 MG tablet Take 1 tablet (150 mg total) by mouth once. Pick up the refill and and take second dose in 5 days if symptoms have not resolved 07/19/14   Liam Graham, PA-C  metroNIDAZOLE (FLAGYL) 500 MG tablet Take 1 tablet (500 mg total) by mouth 2 (two) times daily. Patient not taking: Reported on 07/17/2014 03/01/14   Montine Circle, PA-C  metroNIDAZOLE (FLAGYL) 500 MG tablet Take 1 tablet (500 mg total) by mouth 2 (two) times daily. 07/19/14   Freeman Caldron Annalyssa Thune, PA-C   BP 123/73 mmHg  Pulse 75  Temp(Src) 97.8 F (36.6 C) (Oral)  Resp 18  SpO2 100%  LMP 07/09/2014 Physical Exam  Constitutional: She is oriented to person, place, and time. Vital signs are normal. She appears well-developed and well-nourished. No distress.  HENT:  Head: Normocephalic and atraumatic.  Pulmonary/Chest: Effort normal. No respiratory distress.  Genitourinary: There is no lesion on the right labia. There is no lesion on the left labia. No erythema or bleeding in the vagina. Vaginal discharge (thin white with clumps) found.  Neurological: She is alert and oriented to person, place, and time. She has normal strength. Coordination normal.  Skin: Skin is warm and dry. No rash noted. She is not diaphoretic.  Psychiatric: She has a normal mood and affect. Judgment normal.  Nursing note and vitals reviewed.   ED Course  Procedures (including critical care time) Labs Review Labs Reviewed  CERVICOVAGINAL ANCILLARY ONLY    Imaging Review No  results found.   MDM   1. Vaginitis    Vaginitis, no signs of herpes infection. Treat with Diflucan and metronidazole. Follow-up when necessary  Meds ordered this encounter  Medications  . fluconazole (DIFLUCAN) 150 MG tablet    Sig: Take 1 tablet (150 mg total) by mouth once. Pick up the refill and and take second dose in 5 days if symptoms have not resolved    Dispense:  1 tablet    Refill:  1  . metroNIDAZOLE (FLAGYL)  500 MG tablet    Sig: Take 1 tablet (500 mg total) by mouth 2 (two) times daily.    Dispense:  14 tablet    Refill:  0       Liam Graham, PA-C 07/19/14 1540

## 2014-07-21 LAB — CERVICOVAGINAL ANCILLARY ONLY
WET PREP (BD AFFIRM): NEGATIVE
WET PREP (BD AFFIRM): NEGATIVE
WET PREP (BD AFFIRM): POSITIVE — AB

## 2014-07-22 NOTE — ED Notes (Addendum)
Affirm: Candida pos., Gardnerella and Trich neg.  Pt. adequately treated with Diflucan. .07/22/2014

## 2014-09-14 ENCOUNTER — Encounter (HOSPITAL_COMMUNITY): Payer: Self-pay | Admitting: Emergency Medicine

## 2014-09-14 ENCOUNTER — Emergency Department (HOSPITAL_COMMUNITY)
Admission: EM | Admit: 2014-09-14 | Discharge: 2014-09-14 | Disposition: A | Discharge status: Home / Self Care | Payer: Self-pay | Attending: Emergency Medicine | Admitting: Emergency Medicine

## 2014-09-14 DIAGNOSIS — Z7952 Long term (current) use of systemic steroids: Secondary | ICD-10-CM | POA: Insufficient documentation

## 2014-09-14 DIAGNOSIS — Z3202 Encounter for pregnancy test, result negative: Secondary | ICD-10-CM | POA: Insufficient documentation

## 2014-09-14 DIAGNOSIS — Z880 Allergy status to penicillin: Secondary | ICD-10-CM | POA: Insufficient documentation

## 2014-09-14 DIAGNOSIS — N760 Acute vaginitis: Secondary | ICD-10-CM | POA: Insufficient documentation

## 2014-09-14 DIAGNOSIS — Z792 Long term (current) use of antibiotics: Secondary | ICD-10-CM | POA: Insufficient documentation

## 2014-09-14 HISTORY — DX: Other specified bacterial agents as the cause of diseases classified elsewhere: B96.89

## 2014-09-14 HISTORY — DX: Other specified bacterial agents as the cause of diseases classified elsewhere: N76.0

## 2014-09-14 LAB — URINE MICROSCOPIC-ADD ON

## 2014-09-14 LAB — WET PREP, GENITAL
Clue Cells Wet Prep HPF POC: NONE SEEN
Trich, Wet Prep: NONE SEEN
YEAST WET PREP: NONE SEEN

## 2014-09-14 LAB — URINALYSIS, ROUTINE W REFLEX MICROSCOPIC
Bilirubin Urine: NEGATIVE
Glucose, UA: NEGATIVE mg/dL
HGB URINE DIPSTICK: NEGATIVE
Ketones, ur: NEGATIVE mg/dL
NITRITE: NEGATIVE
Protein, ur: NEGATIVE mg/dL
SPECIFIC GRAVITY, URINE: 1.016 (ref 1.005–1.030)
UROBILINOGEN UA: 0.2 mg/dL (ref 0.0–1.0)
pH: 5.5 (ref 5.0–8.0)

## 2014-09-14 LAB — POC URINE PREG, ED: PREG TEST UR: NEGATIVE

## 2014-09-14 MED ORDER — HYDROCORTISONE 1 % EX CREA: TOPICAL_CREAM | CUTANEOUS | Status: DC

## 2014-09-14 MED ORDER — HYDROCORTISONE 1 % EX CREA
TOPICAL_CREAM | Freq: Once | CUTANEOUS | Status: AC
  Administered 2014-09-14: 10:00:00 via TOPICAL
  Filled 2014-09-14: qty 28

## 2014-09-14 NOTE — ED Notes (Signed)
Left patient so she could get dressed, saw patient take washcloths out of the stocked pile in the cabinet and put them in her purse.

## 2014-09-14 NOTE — ED Notes (Signed)
Called lab to check on status of wet prep. Lab states it will be resulted in about 5 minutes.

## 2014-09-14 NOTE — Discharge Instructions (Signed)

## 2014-09-14 NOTE — ED Notes (Addendum)
PT had hx of bacterial and yeast infection. Pt reports she is having oral sex and then vaginal sex but was instructed by MD not to do so bc she is "sensitve". States very irritated and itchy. Denies discharge.

## 2014-09-14 NOTE — ED Provider Notes (Signed)
CSN: [id number]     Arrival date & time 09/14/14  9357 History   First MD Initiated Contact with Patient 09/14/14 [phone]     Chief Complaint  Patient presents with  . Vaginitis     (Consider location/radiation/quality/duration/timing/severity/associated sxs/prior Treatment) HPI   Melanie Cisneros is a 24 y.o. female presenting for vaginal itching and odor x 4 days. Pt engaged in oral and vaginal intercourse last night with her boyfriend before the itchiness worsened. Pt reports yeast and bacterial infections in March and doesn't feel like they cleared because she wasn't compliant with prescribed medications because she wanted to drink alcohol. Pt has had intermittent episodes of itching since she was last seen but never this intense. No vaginal discharge, vaginal pain, pelvic pain, vaginal bleeding, pain during vaginal intercourse, fever, nausea, vomiting or unexpected weight loss. Pt reports going to the health department every 6 months to be tested for STI's and declines testing for HIV and syphilis. Pt does not have a gynecologist. LMP beginning of April but pt would still like to be tested for possible pregnancy.   Past Medical History  Diagnosis Date  . Bacterial vaginosis    No past surgical history on file. No family history on file. History  Substance Use Topics  . Smoking status: Never Smoker   . Smokeless tobacco: Not on file  . Alcohol Use: Yes   OB History    No data available     Review of Systems  10 Systems reviewed and are negative for acute change except as noted in the HPI.     Allergies  Amoxicillin and Penicillins  Home Medications   Prior to Admission medications   Medication Sig Start Date End Date Taking? Authorizing Provider  cetirizine (ZYRTEC ALLERGY) 10 MG tablet Take 1 tablet (10 mg total) by mouth daily. Patient not taking: Reported on 07/17/2014 03/01/14   Montine Circle, PA-C  diphenhydrAMINE (BENADRYL) 25 MG tablet Take 1 tablet (25 mg total) by  mouth every 6 (six) hours as needed for itching (Rash). Patient not taking: Reported on 07/17/2014 03/01/14   Montine Circle, PA-C  fluconazole (DIFLUCAN) 150 MG tablet Take 1 tablet (150 mg total) by mouth once. Pick up the refill and and take second dose in 5 days if symptoms have not resolved 07/19/14   Liam Graham, PA-C  hydrocortisone cream 1 % Apply to affected area 3-4 times daily for itching. 09/14/14   Torry Istre Carlota Raspberry, PA-C  metroNIDAZOLE (FLAGYL) 500 MG tablet Take 1 tablet (500 mg total) by mouth 2 (two) times daily. Patient not taking: Reported on 07/17/2014 03/01/14   Montine Circle, PA-C  metroNIDAZOLE (FLAGYL) 500 MG tablet Take 1 tablet (500 mg total) by mouth 2 (two) times daily. 07/19/14   Freeman Caldron Baker, PA-C   BP 111/60 mmHg  Pulse 72  Temp(Src) 97.8 F (36.6 C) (Oral)  Resp 18  Ht 5\' 5"  (1.651 m)  Wt 215 lb (97.523 kg)  BMI 35.78 kg/m2  SpO2 99%  LMP 08/15/2014 Physical Exam  Constitutional: She appears well-developed and well-nourished. No distress.  HENT:  Head: Normocephalic and atraumatic.  Eyes: Pupils are equal, round, and reactive to light.  Neck: Normal range of motion. Neck supple.  Cardiovascular: Normal rate and regular rhythm.   Pulmonary/Chest: Effort normal.  Abdominal: Soft.  Genitourinary: Uterus normal. Cervix exhibits no motion tenderness. No tenderness or bleeding in the vagina.  Vaginal wall inflammation; thick white discharge  Neurological: She is alert.  Skin: Skin is warm  and dry.  Nursing note and vitals reviewed.   ED Course  Procedures (including critical care time) Labs Review Labs Reviewed  WET PREP, GENITAL - Abnormal; Notable for the following:    WBC, Wet Prep HPF POC FEW (*)    All other components within normal limits  URINALYSIS, ROUTINE W REFLEX MICROSCOPIC - Abnormal; Notable for the following:    Leukocytes, UA TRACE (*)    All other components within normal limits  URINE MICROSCOPIC-ADD ON - Abnormal; Notable for the  following:    Bacteria, UA FEW (*)    All other components within normal limits  WET PREP, GENITAL  POC URINE PREG, ED  GC/CHLAMYDIA PROBE AMP ([place])    Imaging Review No results found.   EKG Interpretation None      MDM   Final diagnoses:  Vaginitis    Pt given hydrocortisone cream with relief.  Neg urine pregnancy test  The patients wet prep does not show signs of infection. The patient is having vaginal irritation of unknown cause. She has been given patient education on appropriate hygiene, and ideas to reduce irritation. Referral to [facility], pt HIGHLY encouraged to follow-up.  24 y.o.Melanie Cisneros's evaluation in the Emergency Department is complete. It has been determined that no acute conditions requiring further emergency intervention are present at this time. The patient/guardian have been advised of the diagnosis and plan. We have discussed signs and symptoms that warrant return to the ED, such as changes or worsening in symptoms.  Vital signs are stable at discharge. Filed Vitals:   09/14/14 0957  BP: 111/60  Pulse: 72  Temp:   Resp:     Patient/guardian has voiced understanding and agreed to follow-up with the PCP or specialist.   Delos Haring, PA-C 09/14/14 [place], MD 09/14/14 [phone]

## 2014-09-14 NOTE — ED Notes (Signed)
Lab results Negative of upreg reported to [facility].

## 2014-09-15 LAB — GC/CHLAMYDIA PROBE AMP (~~LOC~~) NOT AT ARMC
Chlamydia: NEGATIVE
Neisseria Gonorrhea: NEGATIVE

## 2014-09-16 ENCOUNTER — Encounter (HOSPITAL_COMMUNITY): Payer: Self-pay | Admitting: Emergency Medicine

## 2014-09-16 ENCOUNTER — Emergency Department (HOSPITAL_COMMUNITY)
Admission: EM | Admit: 2014-09-16 | Discharge: 2014-09-16 | Disposition: A | Discharge status: Home / Self Care | Payer: Self-pay | Attending: Emergency Medicine | Admitting: Emergency Medicine

## 2014-09-16 DIAGNOSIS — N898 Other specified noninflammatory disorders of vagina: Secondary | ICD-10-CM | POA: Insufficient documentation

## 2014-09-16 DIAGNOSIS — Z792 Long term (current) use of antibiotics: Secondary | ICD-10-CM | POA: Insufficient documentation

## 2014-09-16 DIAGNOSIS — Z8742 Personal history of other diseases of the female genital tract: Secondary | ICD-10-CM | POA: Insufficient documentation

## 2014-09-16 DIAGNOSIS — Z880 Allergy status to penicillin: Secondary | ICD-10-CM | POA: Insufficient documentation

## 2014-09-16 DIAGNOSIS — Z8619 Personal history of other infectious and parasitic diseases: Secondary | ICD-10-CM | POA: Insufficient documentation

## 2014-09-16 DIAGNOSIS — Z7952 Long term (current) use of systemic steroids: Secondary | ICD-10-CM | POA: Insufficient documentation

## 2014-09-16 MED ORDER — CEFTRIAXONE SODIUM 250 MG IJ SOLR
250.0000 mg | Freq: Once | INTRAMUSCULAR | Status: AC
  Administered 2014-09-16: 250 mg via INTRAMUSCULAR
  Filled 2014-09-16: qty 250

## 2014-09-16 MED ORDER — METRONIDAZOLE 500 MG PO TABS: 500.0000 mg | ORAL_TABLET | Freq: Two times a day (BID) | ORAL | Status: DC

## 2014-09-16 MED ORDER — FLUCONAZOLE 100 MG PO TABS
200.0000 mg | ORAL_TABLET | Freq: Once | ORAL | Status: AC
  Administered 2014-09-16: 200 mg via ORAL
  Filled 2014-09-16: qty 2

## 2014-09-16 MED ORDER — IBUPROFEN 800 MG PO TABS: 800.0000 mg | ORAL_TABLET | Freq: Three times a day (TID) | ORAL | Status: DC | PRN

## 2014-09-16 MED ORDER — OXYCODONE-ACETAMINOPHEN 5-325 MG PO TABS
1.0000 | ORAL_TABLET | Freq: Once | ORAL | Status: AC
  Administered 2014-09-16: 1 via ORAL
  Filled 2014-09-16: qty 1

## 2014-09-16 MED ORDER — DOXYCYCLINE HYCLATE 100 MG PO CAPS
100.0000 mg | ORAL_CAPSULE | Freq: Two times a day (BID) | ORAL | Status: DC
Start: 2014-09-16 — End: 2015-09-27

## 2014-09-16 MED ORDER — STERILE WATER FOR INJECTION IJ SOLN
INTRAMUSCULAR | Status: AC
  Administered 2014-09-16: 0.9 mL
  Filled 2014-09-16: qty 10

## 2014-09-16 NOTE — ED Provider Notes (Signed)
CSN: [id number]     Arrival date & time 09/16/14  0617 History   First MD Initiated Contact with Patient 09/16/14 [phone]     Chief Complaint  Patient presents with  . Vaginal Pain     (Consider location/radiation/quality/duration/timing/severity/associated sxs/prior Treatment) HPI Patient presents to the emergency department with worsening vaginal irritation and discharge.  The patient states she was told to use hydrocortisone cream in the vaginal area and she has feels like this is maintained her symptoms worse.  The patient's complaining of pain in the vaginal area.  The patient denies nausea, vomiting, weakness, dizziness, headache, blurred vision, back pain, dysuria, incontinence, bloody stool, hematuria, pain with urination, abdominal pain, rash, or syncope.  The patient states that she was told that her results from the other day were negative.  Patient states nothing seems make her condition better or worse Past Medical History  Diagnosis Date  . Bacterial vaginosis    History reviewed. No pertinent past surgical history. No family history on file. History  Substance Use Topics  . Smoking status: Never Smoker   . Smokeless tobacco: Not on file  . Alcohol Use: Yes   OB History    No data available     Review of Systems All other systems negative except as documented in the HPI. All pertinent positives and negatives as reviewed in the HPI.   Allergies  Amoxicillin and Penicillins  Home Medications   Prior to Admission medications   Medication Sig Start Date End Date Taking? Authorizing Provider  cetirizine (ZYRTEC ALLERGY) 10 MG tablet Take 1 tablet (10 mg total) by mouth daily. Patient not taking: Reported on 07/17/2014 03/01/14   Montine Circle, PA-C  diphenhydrAMINE (BENADRYL) 25 MG tablet Take 1 tablet (25 mg total) by mouth every 6 (six) hours as needed for itching (Rash). Patient not taking: Reported on 07/17/2014 03/01/14   Montine Circle, PA-C  fluconazole  (DIFLUCAN) 150 MG tablet Take 1 tablet (150 mg total) by mouth once. Pick up the refill and and take second dose in 5 days if symptoms have not resolved 07/19/14   Liam Graham, PA-C  hydrocortisone cream 1 % Apply to affected area 3-4 times daily for itching. 09/14/14   Tiffany Carlota Raspberry, PA-C  metroNIDAZOLE (FLAGYL) 500 MG tablet Take 1 tablet (500 mg total) by mouth 2 (two) times daily. Patient not taking: Reported on 07/17/2014 03/01/14   Montine Circle, PA-C  metroNIDAZOLE (FLAGYL) 500 MG tablet Take 1 tablet (500 mg total) by mouth 2 (two) times daily. 07/19/14   Freeman Caldron Baker, PA-C   BP 103/67 mmHg  Pulse 71  Temp(Src) 97.7 F (36.5 C) (Oral)  Resp 18  SpO2 98%  LMP 08/15/2014 (Exact Date) Physical Exam  Constitutional: She is oriented to person, place, and time. She appears well-developed and well-nourished. No distress.  HENT:  Head: Normocephalic and atraumatic.  Mouth/Throat: Oropharynx is clear and moist.  Eyes: Pupils are equal, round, and reactive to light.  Neck: Normal range of motion. Neck supple.  Cardiovascular: Normal rate, regular rhythm and normal heart sounds.  Exam reveals no gallop and no friction rub.   No murmur heard. Pulmonary/Chest: Effort normal and breath sounds normal. No respiratory distress.  Genitourinary: Vaginal discharge found.  Neurological: She is alert and oriented to person, place, and time.  Skin: Skin is warm and dry. No rash noted. No erythema.  Nursing note and vitals reviewed.   ED Course  Procedures (including critical care time)  I reviewed the  patient's laboratory testing from 2 days ago when she had a vaginal exam at that time.  I will treat her as though she has an STD.  She is told to return here as needed.  She does have a large amount of thick discharge.  Patient is advised to return here as needed.  Told to follow up with a primary care doctor.  She agrees to plan and all questions were answered MDM   Final diagnoses:  None        Dalia Heading, PA-C 09/16/14 [place], MD 09/16/14 [phone]

## 2014-09-16 NOTE — Discharge Instructions (Signed)
Return here as needed.  [facility]

## 2014-09-16 NOTE — ED Notes (Signed)
Pt seen Sunday for vaginal irriataion and given hydrocorisone, states itching/pain has gotten worse since. Positive discharge.

## 2015-01-07 ENCOUNTER — Encounter (HOSPITAL_COMMUNITY): Payer: Self-pay

## 2015-01-07 ENCOUNTER — Emergency Department (HOSPITAL_COMMUNITY)
Admission: EM | Admit: 2015-01-07 | Discharge: 2015-01-07 | Disposition: A | Discharge status: Home / Self Care | Payer: Self-pay | Attending: Emergency Medicine | Admitting: Emergency Medicine

## 2015-01-07 DIAGNOSIS — Z792 Long term (current) use of antibiotics: Secondary | ICD-10-CM | POA: Insufficient documentation

## 2015-01-07 DIAGNOSIS — Z3202 Encounter for pregnancy test, result negative: Secondary | ICD-10-CM | POA: Insufficient documentation

## 2015-01-07 DIAGNOSIS — N760 Acute vaginitis: Secondary | ICD-10-CM | POA: Insufficient documentation

## 2015-01-07 DIAGNOSIS — B9689 Other specified bacterial agents as the cause of diseases classified elsewhere: Secondary | ICD-10-CM

## 2015-01-07 DIAGNOSIS — Z880 Allergy status to penicillin: Secondary | ICD-10-CM | POA: Insufficient documentation

## 2015-01-07 DIAGNOSIS — Z7951 Long term (current) use of inhaled steroids: Secondary | ICD-10-CM | POA: Insufficient documentation

## 2015-01-07 LAB — URINALYSIS, ROUTINE W REFLEX MICROSCOPIC
Bilirubin Urine: NEGATIVE
GLUCOSE, UA: NEGATIVE mg/dL
KETONES UR: NEGATIVE mg/dL
Nitrite: NEGATIVE
Protein, ur: NEGATIVE mg/dL
Specific Gravity, Urine: 1.014 (ref 1.005–1.030)
Urobilinogen, UA: 0.2 mg/dL (ref 0.0–1.0)
pH: 5.5 (ref 5.0–8.0)

## 2015-01-07 LAB — URINE MICROSCOPIC-ADD ON

## 2015-01-07 LAB — POC URINE PREG, ED: Preg Test, Ur: NEGATIVE

## 2015-01-07 LAB — WET PREP, GENITAL
TRICH WET PREP: NONE SEEN
Yeast Wet Prep HPF POC: NONE SEEN

## 2015-01-07 MED ORDER — METRONIDAZOLE 500 MG PO TABS: 500.0000 mg | ORAL_TABLET | Freq: Two times a day (BID) | ORAL | Status: DC

## 2015-01-07 MED ORDER — METRONIDAZOLE 500 MG PO TABS
500.0000 mg | ORAL_TABLET | Freq: Once | ORAL | Status: AC
  Administered 2015-01-07: 500 mg via ORAL
  Filled 2015-01-07: qty 1

## 2015-01-07 NOTE — ED Provider Notes (Signed)
CSN: [id number]     Arrival date & time 01/07/15  2012 History  This chart was scribed for non-physician practitioner, Evalee Jefferson, PA-C, working with Orpah Greek, MD, by Helane Gunther ED Scribe. This patient was seen in room TR01C/TR01C and the patient's care was started at 9:14 PM    Chief Complaint  Patient presents with  . Vaginal Itching  . Vaginal Discharge   The history is provided by the patient. No language interpreter was used.   HPI Comments: Melanie Cisneros is a 24 y.o. female who presents to the Emergency Department complaining of vaginal itching and vaginal discharge onset 4 days ago, mild dysuria. Pt states she was wearing jean shorts without undergarments 5 days ago and states she was very sweaty that day. She theorizes this probably gave her a uti.  She has a PMHx of bacterial vaginosis, but states this does not feel quite the same. She is sexually active with 1 partner who does not have any symptoms. Her LNMP was on 7/19, but she states that they are always irregular. She denies smoking. Pt denies urinary frequency, hematuria, n/v, fever, abdominal pain, and back pain.  Past Medical History  Diagnosis Date  . Bacterial vaginosis    History reviewed. No pertinent past surgical history. History reviewed. No pertinent family history. Social History  Substance Use Topics  . Smoking status: Never Smoker   . Smokeless tobacco: None  . Alcohol Use: Yes   OB History    No data available     Review of Systems  Constitutional: Negative for fever.  HENT: Negative for congestion and sore throat.   Eyes: Negative.   Respiratory: Negative for chest tightness and shortness of breath.   Cardiovascular: Negative for chest pain.  Gastrointestinal: Negative for nausea, vomiting and abdominal pain.  Genitourinary: Positive for dysuria, vaginal discharge and vaginal pain (itching). Negative for frequency.  Musculoskeletal: Negative for back pain, joint swelling,  arthralgias and neck pain.  Skin: Negative.  Negative for rash and wound.  Neurological: Negative for dizziness, weakness, light-headedness, numbness and headaches.  Psychiatric/Behavioral: Negative.     Allergies  Amoxicillin and Penicillins  Home Medications   Prior to Admission medications   Medication Sig Start Date End Date Taking? Authorizing Provider  cetirizine (ZYRTEC ALLERGY) 10 MG tablet Take 1 tablet (10 mg total) by mouth daily. Patient not taking: Reported on 07/17/2014 03/01/14   Montine Circle, PA-C  diphenhydrAMINE (BENADRYL) 25 MG tablet Take 1 tablet (25 mg total) by mouth every 6 (six) hours as needed for itching (Rash). Patient not taking: Reported on 07/17/2014 03/01/14   Montine Circle, PA-C  doxycycline (VIBRAMYCIN) 100 MG capsule Take 1 capsule (100 mg total) by mouth 2 (two) times daily. 09/16/14   Dalia Heading, PA-C  fluconazole (DIFLUCAN) 150 MG tablet Take 1 tablet (150 mg total) by mouth once. Pick up the refill and and take second dose in 5 days if symptoms have not resolved 07/19/14   Liam Graham, PA-C  hydrocortisone cream 1 % Apply to affected area 3-4 times daily for itching. 09/14/14   Tiffany Carlota Raspberry, PA-C  ibuprofen (ADVIL,MOTRIN) 800 MG tablet Take 1 tablet (800 mg total) by mouth every 8 (eight) hours as needed. 09/16/14   Dalia Heading, PA-C  metroNIDAZOLE (FLAGYL) 500 MG tablet Take 1 tablet (500 mg total) by mouth 2 (two) times daily. 01/07/15   Evalee Jefferson, PA-C   BP 116/82 mmHg  Pulse 80  Temp(Src) 97.6 F (36.4 C) (Oral)  Resp 18  Ht 5\' 5"  (1.651 m)  Wt 215 lb (97.523 kg)  BMI 35.78 kg/m2  SpO2 99%  LMP 11/28/2014 Physical Exam  Constitutional: She appears well-developed and well-nourished.  HENT:  Head: Normocephalic and atraumatic.  Eyes: Conjunctivae are normal.  Neck: Normal range of motion.  Cardiovascular: Normal rate, regular rhythm, normal heart sounds and intact distal pulses.   Pulmonary/Chest: Effort normal and breath  sounds normal. She has no wheezes.  Abdominal: Soft. Bowel sounds are normal. There is no tenderness.  Genitourinary: Vaginal discharge found.  Pt has a moderate, white vaginal discharge. She has no bleeding and no cervical motion tenderness. No adnexal tenderness or mass. No rash or lesions.  Musculoskeletal: Normal range of motion.  Neurological: She is alert.  Skin: Skin is warm and dry.  Psychiatric: She has a normal mood and affect.  Nursing note and vitals reviewed.   ED Course  Procedures  DIAGNOSTIC STUDIES: Oxygen Saturation is 99% on RA, normal by my interpretation.    COORDINATION OF CARE: 9:19 PM - Discussed plans to order diagnostic studies and possibly a pelvic exam. Pt advised of plan for treatment and pt agrees.  9:36 PM - Performed pelvic exam with chaperone present. Pt advised of plan for treatment and pt agrees.   Labs Review Labs Reviewed  WET PREP, GENITAL - Abnormal; Notable for the following:    Clue Cells Wet Prep HPF POC MANY (*)    WBC, Wet Prep HPF POC MANY (*)    All other components within normal limits  URINALYSIS, ROUTINE W REFLEX MICROSCOPIC (NOT AT [facility]) - Abnormal; Notable for the following:    APPearance CLOUDY (*)    Hgb urine dipstick TRACE (*)    Leukocytes, UA SMALL (*)    All other components within normal limits  URINE MICROSCOPIC-ADD ON - Abnormal; Notable for the following:    Squamous Epithelial / LPF FEW (*)    Bacteria, UA FEW (*)    All other components within normal limits  URINE CULTURE  POC URINE PREG, ED  GC/CHLAMYDIA PROBE AMP ([place]) NOT AT [facility]      MDM   Final diagnoses:  Bacterial vaginosis    Patients labs reviewed.   Results were also discussed with patient.  Urine cx sent,  Pt tx for bacterial vaginosis. Discussed coverage for gc/chlamydia.  Pt defers pending cultures.  Pt also deferred on blood testing today for syphilis, hiv testing. Prn f/u anticipated. Referral to women's health to establish gyn  care.  The patient appears reasonably screened and/or stabilized for discharge and I doubt any other medical condition or other [facility] requiring further screening, evaluation, or treatment in the ED at this time prior to discharge.   I personally performed the services described in this documentation, which was scribed in my presence. The recorded information has been reviewed and is accurate.   Evalee Jefferson, PA-C 01/08/15 1413  Orpah Greek, MD 01/09/15 1550

## 2015-01-07 NOTE — Discharge Instructions (Signed)
Bacterial Vaginosis Bacterial vaginosis is a vaginal infection that occurs when the normal balance of bacteria in the vagina is disrupted. It results from an overgrowth of certain bacteria. This is the most common vaginal infection in women of childbearing age. Treatment is important to prevent complications, especially in pregnant women, as it can cause a premature delivery. CAUSES  Bacterial vaginosis is caused by an increase in harmful bacteria that are normally present in smaller amounts in the vagina. Several different kinds of bacteria can cause bacterial vaginosis. However, the reason that the condition develops is not fully understood. RISK FACTORS Certain activities or behaviors can put you at an increased risk of developing bacterial vaginosis, including:  Having a new sex partner or multiple sex partners.  Douching.  Using an intrauterine device (IUD) for contraception. Women do not get bacterial vaginosis from toilet seats, bedding, swimming pools, or contact with objects around them. SIGNS AND SYMPTOMS  Some women with bacterial vaginosis have no signs or symptoms. Common symptoms include:  Grey vaginal discharge.  A fishlike odor with discharge, especially after sexual intercourse.  Itching or burning of the vagina and vulva.  Burning or pain with urination. DIAGNOSIS  Your health care provider will take a medical history and examine the vagina for signs of bacterial vaginosis. A sample of vaginal fluid may be taken. Your health care provider will look at this sample under a microscope to check for bacteria and abnormal cells. A vaginal pH test may also be done.  TREATMENT  Bacterial vaginosis may be treated with antibiotic medicines. These may be given in the form of a pill or a vaginal cream. A second round of antibiotics may be prescribed if the condition comes back after treatment.  HOME CARE INSTRUCTIONS   Only take over-the-counter or prescription medicines as  directed by your health care provider.  If antibiotic medicine was prescribed, take it as directed. Make sure you finish it even if you start to feel better.  Do not have sex until treatment is completed.  Tell all sexual partners that you have a vaginal infection. They should see their health care provider and be treated if they have problems, such as a mild rash or itching.  Practice safe sex by using condoms and only having one sex partner. SEEK MEDICAL CARE IF:   Your symptoms are not improving after 3 days of treatment.  You have increased discharge or pain.  You have a fever. MAKE SURE YOU:   Understand these instructions.  Will watch your condition.  Will get help right away if you are not doing well or get worse. FOR MORE INFORMATION  Centers for Disease Control and Prevention, Division of STD Prevention: www.cdc.gov/std American Sexual Health Association (ASHA): www.ashastd.org  Document Released: 05/02/2005 Document Revised: 02/20/2013 Document Reviewed: 12/12/2012 Exit[employer] Patient Information 2015 [employer]. This information is not intended to replace advice given to you by your health care provider. Make sure you discuss any questions you have with your health care provider.  

## 2015-01-07 NOTE — ED Notes (Signed)
Pt refused labdraw

## 2015-01-07 NOTE — ED Notes (Signed)
Called for triage, no answer in lobby. 

## 2015-01-07 NOTE — ED Notes (Signed)
Pt here for vaginal itching about 3 days ago. Some discharge with odor. No urinary sx.

## 2015-01-08 LAB — GC/CHLAMYDIA PROBE AMP (~~LOC~~) NOT AT ARMC
Chlamydia: NEGATIVE
Neisseria Gonorrhea: NEGATIVE

## 2015-01-09 LAB — URINE CULTURE

## 2015-06-26 DIAGNOSIS — Z8742 Personal history of other diseases of the female genital tract: Secondary | ICD-10-CM | POA: Insufficient documentation

## 2015-06-26 DIAGNOSIS — Z880 Allergy status to penicillin: Secondary | ICD-10-CM | POA: Insufficient documentation

## 2015-06-26 DIAGNOSIS — E860 Dehydration: Secondary | ICD-10-CM | POA: Insufficient documentation

## 2015-06-26 DIAGNOSIS — Z7952 Long term (current) use of systemic steroids: Secondary | ICD-10-CM | POA: Insufficient documentation

## 2015-06-26 DIAGNOSIS — R5383 Other fatigue: Secondary | ICD-10-CM | POA: Insufficient documentation

## 2015-06-26 DIAGNOSIS — Z792 Long term (current) use of antibiotics: Secondary | ICD-10-CM | POA: Insufficient documentation

## 2015-06-27 ENCOUNTER — Encounter (HOSPITAL_COMMUNITY): Payer: Self-pay | Admitting: *Deleted

## 2015-06-27 ENCOUNTER — Emergency Department (HOSPITAL_COMMUNITY)
Admission: EM | Admit: 2015-06-27 | Discharge: 2015-06-27 | Disposition: A | Discharge status: Home / Self Care | Payer: Self-pay | Attending: Emergency Medicine | Admitting: Emergency Medicine

## 2015-06-27 DIAGNOSIS — R5383 Other fatigue: Secondary | ICD-10-CM

## 2015-06-27 DIAGNOSIS — E860 Dehydration: Secondary | ICD-10-CM

## 2015-06-27 NOTE — ED Notes (Signed)
Symptoms started a week ago and tonight she came in because of ringing in her ears and when she got in car earlier today she felt like it was going backwards but it was in park

## 2015-06-27 NOTE — ED Provider Notes (Signed)
CSN: [id number]     Arrival date & time 06/26/15  2359 History   First MD Initiated Contact with Patient 06/27/15 0029     Chief Complaint  Patient presents with  . Headache     (Consider location/radiation/quality/duration/timing/severity/associated sxs/prior Treatment) HPI Comments: This is a young woman who states that she started working 2 jobs.  She works from [employer] comes home rest for several hours and then works from 3:49 in the morning, at which time she comes home rest for a few hours and goes back to work.  She been doing this for the past month.  She works consistently and his pattern for 4 days out of the week.  The other 3 days.  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  Patient is a 25 y.o. female presenting with headaches. The history is provided by the patient.  Headache Pain location:  Frontal Quality:  Dull Severity currently:  2/10 Severity at highest:  4/10 Onset quality:  Unable to specify Timing:  Intermittent Progression:  Unchanged Chronicity:  Recurrent Similar to prior headaches: yes   Relieved by:  Aspirin Worsened by:  Nothing Ineffective treatments:  None tried Associated symptoms: no diarrhea, no dizziness, no eye pain, no fever, no nausea, no photophobia, no sinus pressure, no sore throat, no vomiting and no weakness     Past Medical History  Diagnosis Date  . Bacterial vaginosis    History reviewed. No pertinent past surgical history. No family history on file. Social History  Substance Use Topics  . Smoking status: Never Smoker   . Smokeless tobacco: None  . Alcohol Use: Yes   OB History    No data available     Review of Systems  Constitutional: Negative for fever and chills.  HENT: Negative for rhinorrhea, sinus pressure and sore throat.   Eyes: Negative for photophobia and pain.  Respiratory: Negative for shortness of breath.   Cardiovascular: Negative for chest pain.   Gastrointestinal: Negative for nausea, vomiting and diarrhea.  Neurological: Positive for headaches. Negative for dizziness and weakness.  All other systems reviewed and are negative.     Allergies  Amoxicillin and Penicillins  Home Medications   Prior to Admission medications   Medication Sig Start Date End Date Taking? Authorizing Provider  cetirizine (ZYRTEC ALLERGY) 10 MG tablet Take 1 tablet (10 mg total) by mouth daily. Patient not taking: Reported on 07/17/2014 03/01/14   Montine Circle, PA-C  diphenhydrAMINE (BENADRYL) 25 MG tablet Take 1 tablet (25 mg total) by mouth every 6 (six) hours as needed for itching (Rash). Patient not taking: Reported on 07/17/2014 03/01/14   Montine Circle, PA-C  doxycycline (VIBRAMYCIN) 100 MG capsule Take 1 capsule (100 mg total) by mouth 2 (two) times daily. 09/16/14   Dalia Heading, PA-C  fluconazole (DIFLUCAN) 150 MG tablet Take 1 tablet (150 mg total) by mouth once. Pick up the refill and and take second dose in 5 days if symptoms have not resolved 07/19/14   Liam Graham, PA-C  hydrocortisone cream 1 % Apply to affected area 3-4 times daily for itching. 09/14/14   Tiffany Carlota Raspberry, PA-C  ibuprofen (ADVIL,MOTRIN) 800 MG tablet Take 1 tablet (800 mg total) by mouth every 8 (eight) hours as needed. 09/16/14   Dalia Heading, PA-C  metroNIDAZOLE (FLAGYL) 500 MG tablet Take 1 tablet (500 mg total) by mouth 2 (two) times daily. 01/07/15   Evalee Jefferson, PA-C  BP 131/91 mmHg  Pulse 67  Temp(Src) 97.5 F (36.4 C) (Oral)  Resp 14  Ht 5\' 5"  (1.651 m)  Wt 99.791 kg  BMI 36.61 kg/m2  SpO2 99%  LMP 05/29/2015 Physical Exam  Constitutional: She appears well-developed and well-nourished.  HENT:  Head: Normocephalic.  Right Ear: External ear normal.  Left Ear: External ear normal.  Eyes: Pupils are equal, round, and reactive to light.  Cardiovascular: Normal rate and regular rhythm.   Pulmonary/Chest: Effort normal.  Abdominal: Soft.   Musculoskeletal: Normal range of motion. She exhibits no edema or tenderness.  Neurological: She is alert.  Skin: Skin is warm and dry.  Nursing note and vitals reviewed.   ED Course  Procedures (including critical care time) Labs Review Labs Reviewed - No data to display  Imaging Review No results found. I have personally reviewed and evaluated these images and lab results as part of my medical decision-making.   EKG Interpretation None     Long talk with this patient about consistency.  She needs to make sure that she is sleeping for longer periods of time.  She can adjust her work hours, which she states she will try.  She is to eat a regular basis and I also feel that she has slightly dehydrated.  She states that she will go entire shaft without urinating or drinking any fluids.  I think this is contributing to most of her symptoms MDM   Final diagnoses:  Dehydration  Exhaustion         Junius Creamer, NP 06/27/15 [id number]  Malvin Johns, MD 06/27/15 [id number]

## 2015-06-27 NOTE — Discharge Instructions (Signed)
It appears as though your exhausted ED to create a stable pattern for your sleep wake routine as well as regular meals.  He also appears to be slightly dehydrated which can give you symptom to experience of headache and slight weakness.  He should be drinking enough water, juice that you're urinating every 2-3 hours.

## 2015-09-27 ENCOUNTER — Emergency Department (HOSPITAL_COMMUNITY)
Admission: EM | Admit: 2015-09-27 | Discharge: 2015-09-27 | Disposition: A | Discharge status: Home / Self Care | Payer: Medicaid Other | Attending: Emergency Medicine | Admitting: Emergency Medicine

## 2015-09-27 ENCOUNTER — Encounter (HOSPITAL_COMMUNITY): Payer: Self-pay | Admitting: *Deleted

## 2015-09-27 DIAGNOSIS — Z792 Long term (current) use of antibiotics: Secondary | ICD-10-CM | POA: Insufficient documentation

## 2015-09-27 DIAGNOSIS — Z331 Pregnant state, incidental: Secondary | ICD-10-CM | POA: Diagnosis not present

## 2015-09-27 DIAGNOSIS — Z880 Allergy status to penicillin: Secondary | ICD-10-CM | POA: Insufficient documentation

## 2015-09-27 DIAGNOSIS — B9689 Other specified bacterial agents as the cause of diseases classified elsewhere: Secondary | ICD-10-CM

## 2015-09-27 DIAGNOSIS — Z7952 Long term (current) use of systemic steroids: Secondary | ICD-10-CM | POA: Insufficient documentation

## 2015-09-27 DIAGNOSIS — N898 Other specified noninflammatory disorders of vagina: Secondary | ICD-10-CM | POA: Diagnosis present

## 2015-09-27 DIAGNOSIS — Z3200 Encounter for pregnancy test, result unknown: Secondary | ICD-10-CM

## 2015-09-27 DIAGNOSIS — N760 Acute vaginitis: Secondary | ICD-10-CM | POA: Insufficient documentation

## 2015-09-27 LAB — BASIC METABOLIC PANEL
Anion gap: 11 (ref 5–15)
BUN: 10 mg/dL (ref 6–20)
CO2: 21 mmol/L — ABNORMAL LOW (ref 22–32)
Calcium: 8.9 mg/dL (ref 8.9–10.3)
Chloride: 106 mmol/L (ref 101–111)
Creatinine, Ser: 0.69 mg/dL (ref 0.44–1.00)
GFR calc Af Amer: >60 mL/min (ref >60)
GFR calc non Af Amer: >60 mL/min (ref >60)
Glucose, Bld: 93 mg/dL (ref 65–99)
Potassium: 4 mmol/L (ref 3.5–5.1)
Sodium: 138 mmol/L (ref 135–145)

## 2015-09-27 LAB — URINALYSIS, ROUTINE W REFLEX MICROSCOPIC
Bilirubin Urine: NEGATIVE
Glucose, UA: NEGATIVE mg/dL
Hgb urine dipstick: NEGATIVE
Ketones, ur: NEGATIVE mg/dL
Leukocytes, UA: NEGATIVE
Nitrite: NEGATIVE
Protein, ur: NEGATIVE mg/dL
Specific Gravity, Urine: 1.015 (ref 1.005–1.030)
pH: 6 (ref 5.0–8.0)

## 2015-09-27 LAB — CBC
HCT: 37.9 % (ref 36.0–46.0)
Hemoglobin: 12.3 g/dL (ref 12.0–15.0)
MCH: 27.6 pg (ref 26.0–34.0)
MCHC: 32.5 g/dL (ref 30.0–36.0)
MCV: 85 fL (ref 78.0–100.0)
Platelets: 298 10*3/uL (ref 150–400)
RBC: 4.46 MIL/uL (ref 3.87–5.11)
RDW: 13.5 % (ref 11.5–15.5)
WBC: 14.4 10*3/uL — ABNORMAL HIGH (ref 4.0–10.5)

## 2015-09-27 LAB — I-STAT BETA HCG BLOOD, ED (MC, WL, AP ONLY): I-stat hCG, quantitative: 18.1 m[IU]/mL — ABNORMAL HIGH (ref <5)

## 2015-09-27 LAB — WET PREP, GENITAL
SPERM: NONE SEEN
TRICH WET PREP: NONE SEEN
Yeast Wet Prep HPF POC: NONE SEEN

## 2015-09-27 MED ORDER — METRONIDAZOLE 500 MG PO TABS: 500.0000 mg | ORAL_TABLET | Freq: Two times a day (BID) | ORAL | Status: DC

## 2015-09-27 MED ORDER — LIDOCAINE HCL (PF) 1 % IJ SOLN
INTRAMUSCULAR | Status: AC
  Administered 2015-09-27: 5 mL
  Filled 2015-09-27: qty 5

## 2015-09-27 MED ORDER — AZITHROMYCIN 250 MG PO TABS
1000.0000 mg | ORAL_TABLET | Freq: Once | ORAL | Status: AC
  Administered 2015-09-27: 1000 mg via ORAL
  Filled 2015-09-27: qty 4

## 2015-09-27 MED ORDER — CEFTRIAXONE SODIUM 250 MG IJ SOLR
250.0000 mg | Freq: Once | INTRAMUSCULAR | Status: AC
  Administered 2015-09-27: 250 mg via INTRAMUSCULAR
  Filled 2015-09-27: qty 250

## 2015-09-27 NOTE — Discharge Instructions (Signed)
Please read and follow all provided instructions.  Your diagnoses today include:  1. BV (bacterial vaginosis)   2. Possible pregnancy    Tests performed today include:  Vital signs. See below for your results today.   Medications prescribed:   Take as prescribed   Home care instructions:  Follow any educational materials contained in this packet.  Follow-up instructions: Please follow-up with [facility] in 48 hours for further evaluation of pregnancy. Tell them your hCG quant in the ED on 09-27-15 was "18"  Return instructions:   Please return to the Emergency Department if you do not get better, if you get worse, or new symptoms OR  - Fever (temperature greater than 101.13F)  - Bleeding that does not stop with holding pressure to the area    -Severe pain (please note that you may be more sore the day after your accident)  - Chest Pain  - Difficulty breathing  - Severe nausea or vomiting  - Inability to tolerate food and liquids  - Passing out  - Skin becoming red around your wounds  - Change in mental status (confusion or lethargy)  - New numbness or weakness     Please return if you have any other emergent concerns.  Additional Information:  Your vital signs today were: BP 124/84 mmHg   Pulse 63   Temp(Src) 97.3 F (36.3 C) (Oral)   Resp 20   Ht 5\' 5"  (1.651 m)   Wt 95.255 kg   BMI 34.95 kg/m2   SpO2 100%   LMP 08/14/2015 (LMP Unknown) If your blood pressure (BP) was elevated above 135/85 this visit, please have this repeated by your doctor within one month. ---------------

## 2015-09-27 NOTE — ED Notes (Signed)
Pt verbalized understanding of d/c instructions, prescriptions, and follow-up care. No further questions/concerns, VSS, ambulatory w/ steady gait (refused wheelchair) 

## 2015-09-27 NOTE — ED Provider Notes (Signed)
CSN: [id number]     Arrival date & time 09/27/15  [id number] History   First MD Initiated Contact with Patient 09/27/15 0745     Chief Complaint  Patient presents with  . Dizziness  . Vaginal Discharge   (Consider location/radiation/quality/duration/timing/severity/associated sxs/prior Treatment) HPI 25 y.o. female  presents to the Emergency Department today with two complaints: 1) Pt has been having white vaginal discharge x 4 days. Notes no pain. No vaginal bleeding. States she thinks it might be a yeast infection. Notes similar in the past. Has been taking Monistat on and off for the past few days. No dysuria. LMP end of March. 2) Pt also notes chest tightness for the past 3 months. Notes discomfort without aggravating factors. States pain is sharp and last less that a few seconds. Has been intermittent. No risk factors for ACS. No hx MI. No FH. Has tried no OTC medication. No fevers. No SOB/ABD pain. No N/V/D. No diaphoresis. No headaches. No numbness/tingling. No other symptoms noted   Past Medical History  Diagnosis Date  . Bacterial vaginosis    History reviewed. No pertinent past surgical history. No family history on file. Social History  Substance Use Topics  . Smoking status: Never Smoker   . Smokeless tobacco: None  . Alcohol Use: Yes   OB History    No data available     Review of Systems ROS reviewed and all are negative for acute change except as noted in the HPI.  Allergies  Amoxicillin and Penicillins  Home Medications   Prior to Admission medications   Medication Sig Start Date End Date Taking? Authorizing Provider  cetirizine (ZYRTEC ALLERGY) 10 MG tablet Take 1 tablet (10 mg total) by mouth daily. Patient not taking: Reported on 07/17/2014 03/01/14   Montine Circle, PA-C  diphenhydrAMINE (BENADRYL) 25 MG tablet Take 1 tablet (25 mg total) by mouth every 6 (six) hours as needed for itching (Rash). Patient not taking: Reported on 07/17/2014 03/01/14   Montine Circle, PA-C  doxycycline (VIBRAMYCIN) 100 MG capsule Take 1 capsule (100 mg total) by mouth 2 (two) times daily. 09/16/14   Dalia Heading, PA-C  fluconazole (DIFLUCAN) 150 MG tablet Take 1 tablet (150 mg total) by mouth once. Pick up the refill and and take second dose in 5 days if symptoms have not resolved 07/19/14   Liam Graham, PA-C  hydrocortisone cream 1 % Apply to affected area 3-4 times daily for itching. 09/14/14   Tiffany Carlota Raspberry, PA-C  ibuprofen (ADVIL,MOTRIN) 800 MG tablet Take 1 tablet (800 mg total) by mouth every 8 (eight) hours as needed. 09/16/14   Dalia Heading, PA-C  metroNIDAZOLE (FLAGYL) 500 MG tablet Take 1 tablet (500 mg total) by mouth 2 (two) times daily. 01/07/15   Evalee Jefferson, PA-C   BP 124/84 mmHg  Pulse 63  Temp(Src) 97.3 F (36.3 C) (Oral)  Resp 20  Ht 5\' 5"  (1.651 m)  Wt 95.255 kg  BMI 34.95 kg/m2  SpO2 100%  LMP 08/14/2015 (LMP Unknown)   Physical Exam  Constitutional: She is oriented to person, place, and time. She appears well-developed and well-nourished.  HENT:  Head: Normocephalic and atraumatic.  Eyes: EOM are normal. Pupils are equal, round, and reactive to light.  Neck: Normal range of motion. Neck supple. No tracheal deviation present.  Cardiovascular: Normal rate, regular rhythm, normal heart sounds and intact distal pulses.   No murmur heard. Pulmonary/Chest: Effort normal and breath sounds normal. No respiratory distress. She has no wheezes.  She has no rales. She exhibits no tenderness.  Abdominal: Soft. Normal appearance and bowel sounds are normal. There is no tenderness.  Musculoskeletal: Normal range of motion.  Neurological: She is alert and oriented to person, place, and time.  Skin: Skin is warm and dry.  Psychiatric: She has a normal mood and affect. Her behavior is normal. Thought content normal.  Nursing note and vitals reviewed.  Exam performed by Ozella Rocks,  exam chaperoned Date: 09/27/2015 Pelvic exam: normal  external genitalia without evidence of trauma. VULVA: normal appearing vulva with no masses, tenderness or lesion. VAGINA: normal appearing vagina with normal color and discharge, no lesions. CERVIX: normal appearing cervix without lesions, cervical motion tenderness absent, cervical os closed with out purulent discharge; vaginal discharge - clear, white and thin, Wet prep and DNA probe for chlamydia and GC obtained.   ADNEXA: normal adnexa in size, nontender and no masses UTERUS: uterus is normal size, shape, consistency and nontender.    ED Course  Procedures (including critical care time)  Labs Review Labs Reviewed  WET PREP, GENITAL - Abnormal; Notable for the following:    Clue Cells Wet Prep HPF POC PRESENT (*)    WBC, Wet Prep HPF POC MANY (*)    All other components within normal limits  BASIC METABOLIC PANEL - Abnormal; Notable for the following:    CO2 21 (*)    All other components within normal limits  CBC - Abnormal; Notable for the following:    WBC 14.4 (*)    All other components within normal limits  I-STAT BETA HCG BLOOD, ED ([facility], [facility], [facility] ONLY) - Abnormal; Notable for the following:    I-stat hCG, quantitative 18.1 (*)    All other components within normal limits  URINALYSIS, ROUTINE W REFLEX MICROSCOPIC (NOT AT [facility])  GC/CHLAMYDIA PROBE AMP ([place]) NOT AT [facility]   Imaging Review No results found. I have personally reviewed and evaluated these images and lab results as part of my medical decision-making.   EKG Interpretation None      MDM  I have reviewed and evaluated the relevant laboratory values I have reviewed the relevant previous healthcare records. I obtained HPI from historian.  ED Course:  Assessment: Pt is a 25yF who presents with vaginal discharge and chest discomfort. Discharge for the past 4 days. No bleeding. No abdominal pain. Chest discomfort for that past 3 months. No risk factors for ACS. No FH. No Hx MI. Episodes are less than a few  seconds and intermittent with no inciting factors. On exam, pt in NAD. Nontoxic/nonseptic appearing. VSS. Afebrile. Lungs CTA. Heart RRR. Abdomen nontender soft. Pelvic exam with clearish white discharge noted. No CMT. No Adnexal. hCG Quant 18. Clue cells present on wet prep with WBC. Given Rocephin and Azithro. Plan is to DC home with Flagyl. Will have pt follow up in 48 hours for recheck on hCG quant. CP most likely anxiety related. Pt endorses increase stress at work. Labs unremarkable today. At time of discharge, Patient is in no acute distress. Vital Signs are stable. Patient is able to ambulate. Patient able to tolerate PO.    Disposition/Plan:  DC Home Additional Verbal discharge instructions given and discussed with patient.  Pt Instructed to f/u with PCP in the next week for evaluation and treatment of symptoms. Return precautions given Pt acknowledges and agrees with plan  Supervising Physician Virgel Manifold, MD   Final diagnoses:  BV (bacterial vaginosis)  Possible pregnancy  Shary Decamp, PA-C 09/27/15 [id number]  Virgel Manifold, MD 09/27/15 [phone]

## 2015-09-27 NOTE — ED Notes (Addendum)
Pt has multiple complaints.  Initially c/o vaginal dryness and white, non-foul smelling discharge.  Then she stated also she's been dizzy for 2 days and has been experiencing chest heaviness for 1 week.  Denies cough.  Denies abdominal pian.  LMP end of march.

## 2015-09-28 ENCOUNTER — Inpatient Hospital Stay (HOSPITAL_COMMUNITY)
Admission: AD | Admit: 2015-09-28 | Discharge: 2015-09-28 | Disposition: A | Discharge status: Home / Self Care | Payer: Self-pay | Source: Ambulatory Visit | Attending: Family Medicine | Admitting: Family Medicine

## 2015-09-28 ENCOUNTER — Telehealth (HOSPITAL_BASED_OUTPATIENT_CLINIC_OR_DEPARTMENT_OTHER): Payer: Self-pay | Admitting: Emergency Medicine

## 2015-09-28 DIAGNOSIS — Z3200 Encounter for pregnancy test, result unknown: Secondary | ICD-10-CM | POA: Insufficient documentation

## 2015-09-28 LAB — GC/CHLAMYDIA PROBE AMP (~~LOC~~) NOT AT ARMC
CHLAMYDIA, DNA PROBE: POSITIVE — AB
Neisseria Gonorrhea: NEGATIVE

## 2015-09-29 ENCOUNTER — Inpatient Hospital Stay (HOSPITAL_COMMUNITY)
Admission: AD | Admit: 2015-09-29 | Discharge: 2015-09-29 | Disposition: A | Discharge status: Home / Self Care | Payer: Medicaid Other | Source: Ambulatory Visit | Attending: Obstetrics & Gynecology | Admitting: Obstetrics & Gynecology

## 2015-09-29 ENCOUNTER — Telehealth (HOSPITAL_BASED_OUTPATIENT_CLINIC_OR_DEPARTMENT_OTHER): Payer: Self-pay | Admitting: Emergency Medicine

## 2015-09-29 DIAGNOSIS — O0281 Inappropriate change in quantitative human chorionic gonadotropin (hCG) in early pregnancy: Secondary | ICD-10-CM

## 2015-09-29 DIAGNOSIS — O26891 Other specified pregnancy related conditions, first trimester: Secondary | ICD-10-CM | POA: Insufficient documentation

## 2015-09-29 DIAGNOSIS — O3680X0 Pregnancy with inconclusive fetal viability, not applicable or unspecified: Secondary | ICD-10-CM

## 2015-09-29 LAB — HCG, QUANTITATIVE, PREGNANCY: hCG, Beta Chain, Quant, S: 55 m[IU]/mL — ABNORMAL HIGH (ref <5)

## 2015-09-29 NOTE — MAU Note (Signed)
Was seen at [facility] ED on the 14th.  +blood preg test, was told to follow up in 48 hrs.  Pt denies pain or bleeding.  Is very nervous

## 2015-09-29 NOTE — MAU Provider Note (Signed)
Melanie Cisneros  is a 25 y.o. No obstetric history on file. at Unknown who presents to the [facility] today for follow-up quant hCG after 48 hours. The patient was seen in ED on 5/14 and had quant hCG of 18. She denies pain, vaginal bleeding or fever today.  OB History  No data available    Past Medical History  Diagnosis Date  . Bacterial vaginosis      BP 139/82 mmHg  Pulse 80  Temp(Src) 97.8 F (36.6 C)  Resp 18  Ht 5\' 5"  (1.651 m)  Wt 241 lb 9.6 oz (109.589 kg)  BMI 40.20 kg/m2  LMP 08/14/2015 (LMP Unknown)  CONSTITUTIONAL: Well-developed, well-nourished female in no acute distress.  ENT: External right and left ear normal.  EYES: EOM intact, conjunctivae normal.  MUSCULOSKELETAL: Normal range of motion.  CARDIOVASCULAR: Regular heart rate RESPIRATORY: Normal effort NEUROLOGICAL: Alert and oriented to person, place, and time.  SKIN: Skin is warm and dry. No rash noted. Not diaphoretic. No erythema. No pallor. PSYCH: Normal mood and affect. Normal behavior. Normal judgment and thought content.  Results for orders placed or performed during the hospital encounter of 09/29/15 (from the past 24 hour(s))  hCG, quantitative, pregnancy     Status: Abnormal   Collection Time: 09/29/15  7:30 AM  Result Value Ref Range   hCG, Beta Chain, Quant, S 55 (H) <5 mIU/mL    A: Appropriate rise in quant hCG after 48 hours  P: Discharge home First trimester/ectopic precautions discussed Patient will return for follow-up US in 1 week. Order placed and RN to schedule. Patient will return to [facility] for results following [place].  Patient may return to MAU as needed or if her condition were to change or worsen   Larey Days, CNM 09/29/2015 9:38 AM

## 2015-09-29 NOTE — Discharge Instructions (Signed)
First Trimester of Pregnancy  The first trimester of pregnancy is from week 1 until the end of week 12 (months 1 through 3). A week after a sperm fertilizes an egg, the egg will implant on the wall of the uterus. This embryo will begin to develop into a baby. Genes from you and your partner are forming the baby. The female genes determine whether the baby is a boy or a girl. At 6-8 weeks, the eyes and face are formed, and the heartbeat can be seen on ultrasound. At the end of 12 weeks, all the baby's organs are formed.   Now that you are pregnant, you will want to do everything you can to have a healthy baby. Two of the most important things are to get good prenatal care and to follow your health care provider's instructions. Prenatal care is all the medical care you receive before the baby's birth. This care will help prevent, find, and treat any problems during the pregnancy and childbirth.  BODY CHANGES  Your body goes through many changes during pregnancy. The changes vary from woman to woman.   · You may gain or lose a couple of pounds at first.  · You may feel sick to your stomach (nauseous) and throw up (vomit). If the vomiting is uncontrollable, call your health care provider.  · You may tire easily.  · You may develop headaches that can be relieved by medicines approved by your health care provider.  · You may urinate more often. Painful urination may mean you have a bladder infection.  · You may develop heartburn as a result of your pregnancy.  · You may develop constipation because certain hormones are causing the muscles that push waste through your intestines to slow down.  · You may develop hemorrhoids or swollen, bulging veins (varicose veins).  · Your breasts may begin to grow larger and become tender. Your nipples may stick out more, and the tissue that surrounds them (areola) may become darker.  · Your gums may bleed and may be sensitive to brushing and flossing.   · Dark spots or blotches (chloasma, mask of pregnancy) may develop on your face. This will likely fade after the baby is born.  · Your menstrual periods will stop.  · You may have a loss of appetite.  · You may develop cravings for certain kinds of food.  · You may have changes in your emotions from day to day, such as being excited to be pregnant or being concerned that something may go wrong with the pregnancy and baby.  · You may have more vivid and strange dreams.  · You may have changes in your hair. These can include thickening of your hair, rapid growth, and changes in texture. Some women also have hair loss during or after pregnancy, or hair that feels dry or thin. Your hair will most likely return to normal after your baby is born.  WHAT TO EXPECT AT YOUR PRENATAL VISITS  During a routine prenatal visit:  · You will be weighed to make sure you and the baby are growing normally.  · Your blood pressure will be taken.  · Your abdomen will be measured to track your baby's growth.  · The fetal heartbeat will be listened to starting around week 10 or 12 of your pregnancy.  · Test results from any previous visits will be discussed.  Your health care provider may ask you:  · How you are feeling.  · If you   including cigarettes, chewing tobacco, and electronic cigarettes.  If you have any questions. Other tests that may be performed during your first trimester include:  Blood tests to find your blood type and to check for the presence of any previous infections. They will also be used to check for low iron levels (anemia) and Rh antibodies. Later in the pregnancy, blood tests for diabetes will be done along with other tests if problems develop.  Urine tests to check for infections, diabetes, or protein in the urine.  An ultrasound to confirm the proper growth  and development of the baby.  An amniocentesis to check for possible genetic problems.  Fetal screens for spina bifida and Down syndrome.  You may need other tests to make sure you and the baby are doing well.  HIV (human immunodeficiency virus) testing. Routine prenatal testing includes screening for HIV, unless you choose not to have this test. HOME CARE INSTRUCTIONS  Medicines  Follow your health care provider's instructions regarding medicine use. Specific medicines may be either safe or unsafe to take during pregnancy.  Take your prenatal vitamins as directed.  If you develop constipation, try taking a stool softener if your health care provider approves. Diet  Eat regular, well-balanced meals. Choose a variety of foods, such as meat or vegetable-based protein, fish, milk and low-fat dairy products, vegetables, fruits, and whole grain breads and cereals. Your health care provider will help you determine the amount of weight gain that is right for you.  Avoid raw meat and uncooked cheese. These carry germs that can cause birth defects in the baby.  Eating four or five small meals rather than three large meals a day may help relieve nausea and vomiting. If you start to feel nauseous, eating a few soda crackers can be helpful. Drinking liquids between meals instead of during meals also seems to help nausea and vomiting.  If you develop constipation, eat more high-fiber foods, such as fresh vegetables or fruit and whole grains. Drink enough fluids to keep your urine clear or pale yellow. Activity and Exercise  Exercise only as directed by your health care provider. Exercising will help you:  Control your weight.  Stay in shape.  Be prepared for labor and delivery.  Experiencing pain or cramping in the lower abdomen or low back is a good sign that you should stop exercising. Check with your health care provider before continuing normal exercises.  Try to avoid standing for long  periods of time. Move your legs often if you must stand in one place for a long time.  Avoid heavy lifting.  Wear low-heeled shoes, and practice good posture.  You may continue to have sex unless your health care provider directs you otherwise. Relief of Pain or Discomfort  Wear a good support bra for breast tenderness.   Take warm sitz baths to soothe any pain or discomfort caused by hemorrhoids. Use hemorrhoid cream if your health care provider approves.   Rest with your legs elevated if you have leg cramps or low back pain.  If you develop varicose veins in your legs, wear support hose. Elevate your feet for 15 minutes, 3-4 times a day. Limit salt in your diet. Prenatal Care  Schedule your prenatal visits by the twelfth week of pregnancy. They are usually scheduled monthly at first, then more often in the last 2 months before delivery.  Write down your questions. Take them to your prenatal visits.  Keep all your prenatal visits as directed by your  health care provider. Safety  Wear your seat belt at all times when driving.  Make a list of emergency phone numbers, including numbers for family, friends, the hospital, and police and fire departments. General Tips  Ask your health care provider for a referral to a local prenatal education class. Begin classes no later than at the beginning of month 6 of your pregnancy.  Ask for help if you have counseling or nutritional needs during pregnancy. Your health care provider can offer advice or refer you to specialists for help with various needs.  Do not use hot tubs, steam rooms, or saunas.  Do not douche or use tampons or scented sanitary pads.  Do not cross your legs for long periods of time.  Avoid cat litter boxes and soil used by cats. These carry germs that can cause birth defects in the baby and possibly loss of the fetus by miscarriage or stillbirth.  Avoid all smoking, herbs, alcohol, and medicines  not prescribed by your health care provider. Chemicals in these affect the formation and growth of the baby.  Do not use any tobacco products, including cigarettes, chewing tobacco, and electronic cigarettes. If you need help quitting, ask your health care provider. You may receive counseling support and other resources to help you quit.  Schedule a dentist appointment. At home, brush your teeth with a soft toothbrush and be gentle when you floss. SEEK MEDICAL CARE IF:   You have dizziness.  You have mild pelvic cramps, pelvic pressure, or nagging pain in the abdominal area.  You have persistent nausea, vomiting, or diarrhea.  You have a bad smelling vaginal discharge.  You have pain with urination.  You notice increased swelling in your face, hands, legs, or ankles. SEEK IMMEDIATE MEDICAL CARE IF:   You have a fever.  You are leaking fluid from your vagina.  You have spotting or bleeding from your vagina.  You have severe abdominal cramping or pain.  You have rapid weight gain or loss.  You vomit blood or material that looks like coffee grounds.  You are exposed to German measles and have never had them.  You are exposed to fifth disease or chickenpox.  You develop a severe headache.  You have shortness of breath.  You have any kind of trauma, such as from a fall or a car accident.   This information is not intended to replace advice given to you by your health care provider. Make sure you discuss any questions you have with your health care provider.   Document Released: 04/26/2001 Document Revised: 05/23/2014 Document Reviewed: 03/12/2013 Elsevier Interactive Patient Education 2016 [employer]  Safe Medications in Pregnancy   Acne: Benzoyl Peroxide Salicylic Acid  Backache/Headache: Tylenol: 2 regular strength every 4 hours OR              2 Extra strength every 6 hours  Colds/Coughs/Allergies: Benadryl (alcohol free) 25 mg every 6 hours as  needed Breath right strips Claritin Cepacol throat lozenges Chloraseptic throat spray Cold-Eeze- up to three times per day Cough drops, alcohol free Flonase (by prescription only) Guaifenesin Mucinex Robitussin DM (plain only, alcohol free) Saline nasal spray/drops Sudafed (pseudoephedrine) & Actifed ** use only after [redacted] weeks gestation and if you do not have high blood pressure Tylenol Vicks Vaporub Zinc lozenges Zyrtec   Constipation: Colace Ducolax suppositories Fleet enema Glycerin suppositories Metamucil Milk of magnesia Miralax Senokot Smooth move tea  Diarrhea: Kaopectate Imodium A-D  *NO pepto Bismol  Hemorrhoids: Anusol Anusol   Preparation H Tucks  Indigestion: Tums Maalox Mylanta Zantac  Pepcid  Insomnia: Benadryl (alcohol free) 25mg  every 6 hours as needed Tylenol PM Unisom, no Gelcaps  Leg Cramps: Tums MagGel  Nausea/Vomiting:  Bonine Dramamine Emetrol Ginger extract Sea bands Meclizine  Nausea medication to take during pregnancy:  Unisom (doxylamine succinate 25 mg tablets) Take one tablet daily at bedtime. If symptoms are not adequately controlled, the dose can be increased to a maximum recommended dose of two tablets daily (1/2 tablet in the morning, 1/2 tablet mid-afternoon and one at bedtime). Vitamin B6 100mg  tablets. Take one tablet twice a day (up to 200 mg per day).  Skin Rashes: Aveeno products Benadryl cream or 25mg  every 6 hours as needed Calamine Lotion 1% cortisone cream  Yeast infection: Gyne-lotrimin 7 Monistat 7   **If taking multiple medications, please check labels to avoid duplicating the same active ingredients **take medication as directed on the label ** Do not exceed 4000 mg of tylenol in 24 hours **Do not take medications that contain aspirin or ibuprofen

## 2015-10-01 ENCOUNTER — Encounter (HOSPITAL_COMMUNITY): Payer: Self-pay | Admitting: *Deleted

## 2015-10-01 ENCOUNTER — Inpatient Hospital Stay (HOSPITAL_COMMUNITY)
Admission: AD | Admit: 2015-10-01 | Discharge: 2015-10-01 | Disposition: A | Discharge status: Home / Self Care | Payer: Medicaid Other | Source: Ambulatory Visit | Attending: Obstetrics & Gynecology | Admitting: Obstetrics & Gynecology

## 2015-10-01 DIAGNOSIS — E349 Endocrine disorder, unspecified: Secondary | ICD-10-CM

## 2015-10-01 DIAGNOSIS — O26891 Other specified pregnancy related conditions, first trimester: Secondary | ICD-10-CM | POA: Diagnosis not present

## 2015-10-01 DIAGNOSIS — O0281 Inappropriate change in quantitative human chorionic gonadotropin (hCG) in early pregnancy: Secondary | ICD-10-CM | POA: Diagnosis not present

## 2015-10-01 DIAGNOSIS — Z3A01 Less than 8 weeks gestation of pregnancy: Secondary | ICD-10-CM | POA: Insufficient documentation

## 2015-10-01 DIAGNOSIS — O3680X0 Pregnancy with inconclusive fetal viability, not applicable or unspecified: Secondary | ICD-10-CM

## 2015-10-01 LAB — HCG, QUANTITATIVE, PREGNANCY: hCG, Beta Chain, Quant, S: 116 m[IU]/mL — ABNORMAL HIGH (ref <5)

## 2015-10-01 NOTE — MAU Note (Signed)
Pt presents to MAU for repeat BHCG. Denies any pain of bleeding

## 2015-10-01 NOTE — Discharge Instructions (Signed)

## 2015-10-01 NOTE — MAU Provider Note (Signed)
S:  Ms.Melanie Cisneros is a 25 y.o. female G1P0 @ [redacted]w[redacted]d presenting to MAU for a follow up beta hcg level. She denies pain or bleeding at this time.    O:  GENERAL: Well-developed, well-nourished female in no acute distress.  LUNGS: Effort normal SKIN: Warm, dry and without erythema PSYCH: Normal mood and affect  Filed Vitals:   10/01/15 0914  BP: 135/80  Pulse: 70  Temp: 98.3 F (36.8 C)  Resp: 18   MDM:  Beta hcg level 5/14: 18 Beta hcg level 5/16: 55 Beta hcg level 5/18: 116   A:  1. Elevated serum human chorionic gonadotropin (hCG) level   2. Pregnancy of unknown anatomic location     P:  Discharge home in stable condition  [place] in 7-10 days- [place] to call and schedule Ectopic precautions Return to MAU if symptoms worsen  Pelvic rest  Lezlie Lye, NP 10/01/2015 10:22 AM

## 2015-10-06 ENCOUNTER — Encounter (HOSPITAL_COMMUNITY): Payer: Self-pay | Admitting: *Deleted

## 2015-10-06 ENCOUNTER — Inpatient Hospital Stay (HOSPITAL_COMMUNITY)
Admission: AD | Admit: 2015-10-06 | Discharge: 2015-10-06 | Disposition: A | Discharge status: Home / Self Care | Payer: Medicaid Other | Source: Ambulatory Visit | Attending: Family Medicine | Admitting: Family Medicine

## 2015-10-06 DIAGNOSIS — Z202 Contact with and (suspected) exposure to infections with a predominantly sexual mode of transmission: Secondary | ICD-10-CM

## 2015-10-06 DIAGNOSIS — Z331 Pregnant state, incidental: Secondary | ICD-10-CM | POA: Insufficient documentation

## 2015-10-06 DIAGNOSIS — Z880 Allergy status to penicillin: Secondary | ICD-10-CM | POA: Insufficient documentation

## 2015-10-06 LAB — URINALYSIS, ROUTINE W REFLEX MICROSCOPIC
BILIRUBIN URINE: NEGATIVE
Glucose, UA: NEGATIVE mg/dL
Hgb urine dipstick: NEGATIVE
KETONES UR: NEGATIVE mg/dL
NITRITE: NEGATIVE
Protein, ur: NEGATIVE mg/dL
Specific Gravity, Urine: 1.02 (ref 1.005–1.030)
pH: 5.5 (ref 5.0–8.0)

## 2015-10-06 LAB — URINE MICROSCOPIC-ADD ON: WBC UA: NONE SEEN WBC/hpf (ref 0–5)

## 2015-10-06 MED ORDER — AZITHROMYCIN 250 MG PO TABS
1000.0000 mg | ORAL_TABLET | Freq: Every day | ORAL | Status: DC
  Administered 2015-10-06: 1000 mg via ORAL
  Filled 2015-10-06: qty 4

## 2015-10-06 NOTE — MAU Note (Addendum)
Had some pains this weekend during and after dancing.  Got scared, went to the ER (in [facility])  Was told she had a UTI.  Denies any urinary symptoms-unsure if she really needs to take the rx- is currently finishing rx for BV.  Also had relations with her FOB, did use a condom; but was treated last week for Chlamydia.

## 2015-10-06 NOTE — Discharge Instructions (Signed)
Pregnancy and Sexually Transmitted Diseases A sexually transmitted disease (STD) is a disease or infection that may be passed (transmitted) from person to person, usually during sexual activity. This may happen by way of saliva, semen, blood, vaginal mucus, or urine. An STD can be caused by bacteria, viruses, or parasites.  During pregnancy, STDs can be dangerous for both you and your unborn baby. It is important to take steps to reduce your chances of getting an STD. Also, you need to be looked at by your health care provider right away if you think you may have an STD or may have been exposed to an STD. Diagnosis and treatment will depend on the type of STD. If you are already pregnant, you will be screened for HIV (human immunodeficiency virus) early in your pregnancy. If you are at high risk for HIV, this test may be repeated during your third trimester of pregnancy. WHAT ARE SOME COMMON STDs? There are different types of STDs. Some STDs that cause problems in pregnancy include:  Gonorrhea.   Chlamydia.   Syphilis.   HIV and AIDS.   Genital herpes.   Hepatitis.   Genital warts.   Human papillomavirus (HPV). STDs that do not affect the baby include:   Trichomonas.   Pubic lice.  WHAT ARE THE POSSIBLE EFFECTS OF STDs DURING PREGNANCY? STDs can have various effects during pregnancy. STDs can cause:   Stillbirth.   Miscarriage.   Premature labor.   Premature rupture of the membranes.   Serious birth defects or deformities.   Infection of the amniotic sac.   Infections that occur after birth (postpartum) in you and the baby.   Slowed growth of the baby before birth.   Illnesses in newborns.  WHAT ARE COMMON SYMPTOMS OF STDs? Different STDs have different symptoms. Some women may not have any symptoms. If symptoms are present, they may include:  Painful or bloody urination.   Pain in the pelvis, abdomen, vagina, anus, throat, or eyes.  A skin  rash, itching, or irritation.   Growths, ulcerations, blisters, or sores in the genital and anal areas.   Fever.   Abnormal vaginal discharge with or without bad odor.   Pain or bleeding during sexual intercourse.   Yellow skin and eyes (jaundice). This is seen with hepatitis.   Swollen glands in the groin area.  Even if symptoms are not present, an STD can still be passed to another person during sexual contact.  HOW ARE STDs DIAGNOSED? Your health care provider can determine if you have an STD through different tests. These can include blood tests, urine tests, and tests performed during a pelvic exam. You should be screened for sexually transmitted illnesses (STIs), including gonorrhea and chlamydia if:   You are sexually active and are younger than 24 years old.  You are older than 24 years old and your health care provider tells you that you are at risk for this type of infection.  Your sexual activity has changed since you were last screened, and you are at an increased risk for chlamydia or gonorrhea. Ask your health care provider if you are at risk. HOW CAN I REDUCE MY RISK OF GETTING AN STD?  Take these steps to reduce your risk of getting an STD:  Use a latex condom or female condom during sexual intercourse.   Use dental dams and water-soluble lubricants during sexual activity. Do not use petroleum jelly or oils.  Avoid having multiple sex partners.  Do not have sex  with someone who has other sex partners.  Do not have sex with anyone you do not know or who is at high risk for an STD.   Avoid risky sex acts that can break the skin.  Do not have sex if you have open sores on your mouth or skin.  Avoid engaging in oral and anal sex acts.   Get the hepatitis vaccine. It is safe for pregnant women.  WHAT SHOULD I DO IF I THINK I HAVE AN STD?  See your health care provider.  Tell your sexual partner(s). They should be tested and treated for any  STDs.  Do not have sex until your health care provider says it is okay. WHEN SHOULD I GET IMMEDIATE MEDICAL CARE? Contact your health care provider right away if:   You have any symptoms of an STD.  You think you or your sex partner has an STD, even if there are no symptoms.  You think you may have been exposed to an STD.   This information is not intended to replace advice given to you by your health care provider. Make sure you discuss any questions you have with your health care provider.   Document Released: 06/09/2004 Document Revised: 05/23/2014 Document Reviewed: 11/29/2012 Elsevier Interactive Patient Education [employer].

## 2015-10-06 NOTE — MAU Provider Note (Signed)
Chief Complaint: Abdominal Pain   First Provider Initiated Contact with Patient 10/06/15 1048     SUBJECTIVE HPI: Melanie Cisneros is a 25 y.o. G1P0 at [redacted]w[redacted]d by LMP who presents to [facility] concerned that she may have been reexposed to chlamydia and with questions about whether she needs to take an antibiotic for bladder infection diagnosed by an urgent care outside of [facility].   States she had intercourse last night with her partner who recently gave her chlamydia. States they were both treated last week and they used condom last night after intercourse he was spitting in her vaginal area and she is worried that she may have been reexposed.  Patient is also concerned because she was told that she had a UTI because she had white blood cells in her urine, but she is not having any urinary complaints and doesn't want to take any unnecessary medicine.  Associated signs and symptoms: Negative for fever, chills, dysuria, urgency, frequency, hematuria, flank pain, vaginal discharge or vaginal bleeding.  Has had [facility] followed in ED in MAU since 09/27/2015, but has not had any pain or vaginal bleeding. Viability scan has been ordered. Patient plans to get prenatal care at [facility] outpatient clinic.  Past Medical History  Diagnosis Date  . Bacterial vaginosis    OB History  Gravida Para Term Preterm AB SAB TAB Ectopic Multiple Living  1             # Outcome Date GA Lbr Len/2nd Weight Sex Delivery Anes PTL Lv  1 Current              Past Surgical History  Procedure Laterality Date  . No past surgeries     Social History   Social History  . Marital Status: Single    Spouse Name: N/A  . Number of Children: N/A  . Years of Education: N/A   Occupational History  . Not on file.   Social History Main Topics  . Smoking status: Never Smoker   . Smokeless tobacco: Not on file  . Alcohol Use: Yes  . Drug Use: No  . Sexual Activity: Yes    Birth Control/  Protection: None   Other Topics Concern  . Not on file   Social History Narrative   No current facility-administered medications on file prior to encounter.   Current Outpatient Prescriptions on File Prior to Encounter  Medication Sig Dispense Refill  . metroNIDAZOLE (FLAGYL) 500 MG tablet Take 1 tablet (500 mg total) by mouth 2 (two) times daily. 14 tablet 0   Allergies  Allergen Reactions  . Amoxicillin Hives  . Penicillins Hives    Has patient had a PCN reaction causing immediate rash, facial/tongue/throat swelling, SOB or lightheadedness with hypotension: yes Has patient had a PCN reaction causing severe rash involving mucus membranes or skin necrosis: Yes Has patient had a PCN reaction that required hospitalization Yes Has patient had a PCN reaction occurring within the last 10 years: No If all of the above answers are "NO", then may proceed with Cephalosporin use.     I have reviewed the past Medical Hx, Surgical Hx, Social Hx, Allergies and Medications.   Review of Systems  Constitutional: Negative for fever and chills.  Gastrointestinal: Negative for abdominal pain.  Genitourinary: Negative for dysuria, urgency, frequency, hematuria, flank pain, vaginal bleeding and vaginal discharge.    OBJECTIVE Patient Vitals for the past 24 hrs:  BP Temp Temp src Pulse Resp  10/06/15  0901 124/81 mmHg 98.2 F (36.8 C) Oral 79 18   Constitutional: Well-developed, well-nourished female in no acute distress.  Cardiovascular: normal rate Respiratory: normal rate and effort.  GI: Abd soft, non-tender Neurologic: Alert and oriented x 4.  GU: Deferred  LAB RESULTS Results for orders placed or performed during the hospital encounter of 10/06/15 (from the past 24 hour(s))  Urinalysis, Routine w reflex microscopic (not at [facility])     Status: Abnormal   Collection Time: 10/06/15  9:05 AM  Result Value Ref Range   Color, Urine YELLOW YELLOW   APPearance CLEAR CLEAR   Specific  Gravity, Urine 1.020 1.005 - 1.030   pH 5.5 5.0 - 8.0   Glucose, UA NEGATIVE NEGATIVE mg/dL   Hgb urine dipstick NEGATIVE NEGATIVE   Bilirubin Urine NEGATIVE NEGATIVE   Ketones, ur NEGATIVE NEGATIVE mg/dL   Protein, ur NEGATIVE NEGATIVE mg/dL   Nitrite NEGATIVE NEGATIVE   Leukocytes, UA TRACE (A) NEGATIVE  Urine microscopic-add on     Status: Abnormal   Collection Time: 10/06/15  9:05 AM  Result Value Ref Range   Squamous Epithelial / LPF 0-5 (A) NONE SEEN   WBC, UA NONE SEEN 0 - 5 WBC/hpf   RBC / HPF 0-5 0 - 5 RBC/hpf   Bacteria, UA RARE (A) NONE SEEN   Urine-Other MUCOUS PRESENT     IMAGING No results found.  MAU COURSE/MDM UA repeated. Migraine negative. Will send culture, but instructed patient that she does not need to take additional antibiotic for UTI at this time. Will call if culture comes back positive. Discussed that it is unlikely that she would contract Chlamydia from her partner given the type of contact and limited time since treatment, but will offer retreatment to be sure and at patient's request. Azithromycin given.  ASSESSMENT 1. STD exposure   2. Pregnancy, incidental     PLAN Discharge home in stable condition. First trimester Precautions Monistat when necessary if you develop yeast infection symptoms. Discussed safe sex practices. Follow-up Information    Follow up with [facility].   Specialty:  Radiology   Why:  Will call you to schedule a follow-up ultrasound   Contact information:   [id number] [place] [phone]      Follow up with [facility].   Why:  As needed in emergencies   Contact information:   [id number] [place] [phone]      Follow up with [facility].   Specialty:  Obstetrics and Gynecology   Why:  Routine prenatal visit after  ultrasound   Contact information:   [place] [phone]       Medication List    TAKE these medications        HAIR/SKIN/NAILS 1250-7.5-7.5 MCG-MG-UNT Chew  Generic drug:  Biotin w/ Vitamins C & E  Chew 1 tablet by mouth 2 (two) times daily.     metroNIDAZOLE 500 MG tablet  Commonly known as:  FLAGYL  Take 1 tablet (500 mg total) by mouth 2 (two) times daily.     prenatal multivitamin Tabs tablet  Take 1 tablet by mouth daily at 12 noon.        [place], CNM 10/06/2015  11:01 AM

## 2015-10-08 ENCOUNTER — Other Ambulatory Visit (HOSPITAL_COMMUNITY): Payer: Self-pay | Admitting: Obstetrics and Gynecology

## 2015-10-14 ENCOUNTER — Ambulatory Visit (HOSPITAL_COMMUNITY)
Admission: RE | Admit: 2015-10-14 | Discharge: 2015-10-14 | Disposition: A | Discharge status: Home / Self Care | Payer: Medicaid Other | Source: Ambulatory Visit | Attending: Obstetrics and Gynecology | Admitting: Obstetrics and Gynecology

## 2015-10-14 ENCOUNTER — Ambulatory Visit (INDEPENDENT_AMBULATORY_CARE_PROVIDER_SITE_OTHER): Payer: Medicaid Other | Admitting: Certified Nurse Midwife

## 2015-10-14 DIAGNOSIS — Z3A01 Less than 8 weeks gestation of pregnancy: Secondary | ICD-10-CM | POA: Diagnosis not present

## 2015-10-14 DIAGNOSIS — O3680X0 Pregnancy with inconclusive fetal viability, not applicable or unspecified: Secondary | ICD-10-CM | POA: Insufficient documentation

## 2015-10-14 DIAGNOSIS — E349 Endocrine disorder, unspecified: Secondary | ICD-10-CM

## 2015-10-14 DIAGNOSIS — O3680X1 Pregnancy with inconclusive fetal viability, fetus 1: Secondary | ICD-10-CM

## 2015-10-14 NOTE — Patient Instructions (Signed)
First Trimester of Pregnancy The first trimester of pregnancy is from week 1 until the end of week 12 (months 1 through 3). A week after a sperm fertilizes an egg, the egg will implant on the wall of the uterus. This embryo will begin to develop into a baby. Genes from you and your partner are forming the baby. The female genes determine whether the baby is a boy or a girl. At 6-8 weeks, the eyes and face are formed, and the heartbeat can be seen on ultrasound. At the end of 12 weeks, all the baby's organs are formed.  Now that you are pregnant, you will want to do everything you can to have a healthy baby. Two of the most important things are to get good prenatal care and to follow your health care provider's instructions. Prenatal care is all the medical care you receive before the baby's birth. This care will help prevent, find, and treat any problems during the pregnancy and childbirth. BODY CHANGES Your body goes through many changes during pregnancy. The changes vary from woman to woman.   You may gain or lose a couple of pounds at first.  You may feel sick to your stomach (nauseous) and throw up (vomit). If the vomiting is uncontrollable, call your health care provider.  You may tire easily.  You may develop headaches that can be relieved by medicines approved by your health care provider.  You may urinate more often. Painful urination may mean you have a bladder infection.  You may develop heartburn as a result of your pregnancy.  You may develop constipation because certain hormones are causing the muscles that push waste through your intestines to slow down.  You may develop hemorrhoids or swollen, bulging veins (varicose veins).  Your breasts may begin to grow larger and become tender. Your nipples may stick out more, and the tissue that surrounds them (areola) may become darker.  Your gums may bleed and may be sensitive to brushing and flossing.  Dark spots or blotches (chloasma,  mask of pregnancy) may develop on your face. This will likely fade after the baby is born.  Your menstrual periods will stop.  You may have a loss of appetite.  You may develop cravings for certain kinds of food.  You may have changes in your emotions from day to day, such as being excited to be pregnant or being concerned that something may go wrong with the pregnancy and baby.  You may have more vivid and strange dreams.  You may have changes in your hair. These can include thickening of your hair, rapid growth, and changes in texture. Some women also have hair loss during or after pregnancy, or hair that feels dry or thin. Your hair will most likely return to normal after your baby is born. WHAT TO EXPECT AT YOUR PRENATAL VISITS During a routine prenatal visit:  You will be weighed to make sure you and the baby are growing normally.  Your blood pressure will be taken.  Your abdomen will be measured to track your baby's growth.  The fetal heartbeat will be listened to starting around week 10 or 12 of your pregnancy.  Test results from any previous visits will be discussed. Your health care provider may ask you:  How you are feeling.  If you are feeling the baby move.  If you have had any abnormal symptoms, such as leaking fluid, bleeding, severe headaches, or abdominal cramping.  If you are using any tobacco products,   including cigarettes, chewing tobacco, and electronic cigarettes.  If you have any questions. Other tests that may be performed during your first trimester include:  Blood tests to find your blood type and to check for the presence of any previous infections. They will also be used to check for low iron levels (anemia) and Rh antibodies. Later in the pregnancy, blood tests for diabetes will be done along with other tests if problems develop.  Urine tests to check for infections, diabetes, or protein in the urine.  An ultrasound to confirm the proper growth  and development of the baby.  An amniocentesis to check for possible genetic problems.  Fetal screens for spina bifida and Down syndrome.  You may need other tests to make sure you and the baby are doing well.  HIV (human immunodeficiency virus) testing. Routine prenatal testing includes screening for HIV, unless you choose not to have this test. HOME CARE INSTRUCTIONS  Medicines  Follow your health care provider's instructions regarding medicine use. Specific medicines may be either safe or unsafe to take during pregnancy.  Take your prenatal vitamins as directed.  If you develop constipation, try taking a stool softener if your health care provider approves. Diet  Eat regular, well-balanced meals. Choose a variety of foods, such as meat or vegetable-based protein, fish, milk and low-fat dairy products, vegetables, fruits, and whole grain breads and cereals. Your health care provider will help you determine the amount of weight gain that is right for you.  Avoid raw meat and uncooked cheese. These carry germs that can cause birth defects in the baby.  Eating four or five small meals rather than three large meals a day may help relieve nausea and vomiting. If you start to feel nauseous, eating a few soda crackers can be helpful. Drinking liquids between meals instead of during meals also seems to help nausea and vomiting.  If you develop constipation, eat more high-fiber foods, such as fresh vegetables or fruit and whole grains. Drink enough fluids to keep your urine clear or pale yellow. Activity and Exercise  Exercise only as directed by your health care provider. Exercising will help you:  Control your weight.  Stay in shape.  Be prepared for labor and delivery.  Experiencing pain or cramping in the lower abdomen or low back is a good sign that you should stop exercising. Check with your health care provider before continuing normal exercises.  Try to avoid standing for long  periods of time. Move your legs often if you must stand in one place for a long time.  Avoid heavy lifting.  Wear low-heeled shoes, and practice good posture.  You may continue to have sex unless your health care provider directs you otherwise. Relief of Pain or Discomfort  Wear a good support bra for breast tenderness.   Take warm sitz baths to soothe any pain or discomfort caused by hemorrhoids. Use hemorrhoid cream if your health care provider approves.   Rest with your legs elevated if you have leg cramps or low back pain.  If you develop varicose veins in your legs, wear support hose. Elevate your feet for 15 minutes, 3-4 times a day. Limit salt in your diet. Prenatal Care  Schedule your prenatal visits by the twelfth week of pregnancy. They are usually scheduled monthly at first, then more often in the last 2 months before delivery.  Write down your questions. Take them to your prenatal visits.  Keep all your prenatal visits as directed by your   health care provider. Safety  Wear your seat belt at all times when driving.  Make a list of emergency phone numbers, including numbers for family, friends, the hospital, and police and fire departments. General Tips  Ask your health care provider for a referral to a local prenatal education class. Begin classes no later than at the beginning of month 6 of your pregnancy.  Ask for help if you have counseling or nutritional needs during pregnancy. Your health care provider can offer advice or refer you to specialists for help with various needs.  Do not use hot tubs, steam rooms, or saunas.  Do not douche or use tampons or scented sanitary pads.  Do not cross your legs for long periods of time.  Avoid cat litter boxes and soil used by cats. These carry germs that can cause birth defects in the baby and possibly loss of the fetus by miscarriage or stillbirth.  Avoid all smoking, herbs, alcohol, and medicines not prescribed by  your health care provider. Chemicals in these affect the formation and growth of the baby.  Do not use any tobacco products, including cigarettes, chewing tobacco, and electronic cigarettes. If you need help quitting, ask your health care provider. You may receive counseling support and other resources to help you quit.  Schedule a dentist appointment. At home, brush your teeth with a soft toothbrush and be gentle when you floss. SEEK MEDICAL CARE IF:   You have dizziness.  You have mild pelvic cramps, pelvic pressure, or nagging pain in the abdominal area.  You have persistent nausea, vomiting, or diarrhea.  You have a bad smelling vaginal discharge.  You have pain with urination.  You notice increased swelling in your face, hands, legs, or ankles. SEEK IMMEDIATE MEDICAL CARE IF:   You have a fever.  You are leaking fluid from your vagina.  You have spotting or bleeding from your vagina.  You have severe abdominal cramping or pain.  You have rapid weight gain or loss.  You vomit blood or material that looks like coffee grounds.  You are exposed to German measles and have never had them.  You are exposed to fifth disease or chickenpox.  You develop a severe headache.  You have shortness of breath.  You have any kind of trauma, such as from a fall or a car accident.   This information is not intended to replace advice given to you by your health care provider. Make sure you discuss any questions you have with your health care provider.   Document Released: 04/26/2001 Document Revised: 05/23/2014 Document Reviewed: 03/12/2013 Elsevier Interactive Patient Education 2016 [employer]  

## 2015-10-14 NOTE — Progress Notes (Signed)
Ultrasounds Results Note  SUBJECTIVE HPI: Pt presents to clinic for ultrasound results Melanie Cisneros is a 25 y.o. G1P0 at [redacted]w[redacted]d by LMP who presents to the [facility] for follow up ultrasound results. The patient denies abdominal pain or vaginal bleeding.  Ultrasound was performed earlier today.   Past Medical History  Diagnosis Date  . Bacterial vaginosis    Past Surgical History  Procedure Laterality Date  . No past surgeries     Social History   Social History  . Marital Status: Single    Spouse Name: N/A  . Number of Children: N/A  . Years of Education: N/A   Occupational History  . Not on file.   Social History Main Topics  . Smoking status: Never Smoker   . Smokeless tobacco: Not on file  . Alcohol Use: Yes  . Drug Use: No  . Sexual Activity: Yes    Birth Control/ Protection: None   Other Topics Concern  . Not on file   Social History Narrative   Current Outpatient Prescriptions on File Prior to Visit  Medication Sig Dispense Refill  . Biotin w/ Vitamins C & E (HAIR/SKIN/NAILS) 1250-7.5-7.5 MCG-MG-UNT CHEW Chew 1 tablet by mouth 2 (two) times daily.    . metroNIDAZOLE (FLAGYL) 500 MG tablet Take 1 tablet (500 mg total) by mouth 2 (two) times daily. 14 tablet 0  . Prenatal Vit-Fe Fumarate-FA (PRENATAL MULTIVITAMIN) TABS tablet Take 1 tablet by mouth daily at 12 noon.     No current facility-administered medications on file prior to visit.   Allergies  Allergen Reactions  . Amoxicillin Hives  . Penicillins Hives    Has patient had a PCN reaction causing immediate rash, facial/tongue/throat swelling, SOB or lightheadedness with hypotension: yes Has patient had a PCN reaction causing severe rash involving mucus membranes or skin necrosis: Yes Has patient had a PCN reaction that required hospitalization Yes Has patient had a PCN reaction occurring within the last 10 years: No If all of the above answers are "NO", then may proceed with  Cephalosporin use.     I have reviewed patient's Past Medical Hx, Surgical Hx, Family Hx, Social Hx, medications and allergies.   Review of Systems Review of Systems  Constitutional: Negative for fever and chills.  Gastrointestinal: Negative for nausea, vomiting, abdominal pain, diarrhea and constipation.  Genitourinary: Negative for dysuria.  Musculoskeletal: Negative for back pain.  Neurological: Negative for dizziness and weakness.    Physical Exam  LMP 08/23/2015  GENERAL: Well-developed, well-nourished female in no acute distress.  HEENT: Normocephalic, atraumatic.   LUNGS: Effort normal ABDOMEN: soft, non-tender HEART: Regular rate  SKIN: Warm, dry and without erythema PSYCH: Normal mood and affect NEURO: Alert and oriented x 4  LAB RESULTS                  08/22/2012 17:51 12/10/2012 18:12 12/10/2012 18:40 02/09/2013 19:47 04/19/2013 01:25 06/03/2013 16:25 06/05/2013 02:00 06/05/2013 02:10 03/01/2014 16:24 07/17/2014 21:12 09/14/2014 09:21 01/07/2015 20:48 09/27/2015 07:54 09/29/2015 07:30 10/01/2015 09:14                               55 (H) 116 (H)   IMAGING US Ob Comp Less 14 Wks  10/14/2015  CLINICAL DATA:  Early pregnancy.  Evaluate viability. EXAM: OBSTETRIC <14 WK [place] AND TRANSVAGINAL OB US TECHNIQUE: Both transabdominal and transvaginal ultrasound examinations were performed for complete evaluation of the gestation as  well as the maternal uterus, adnexal regions, and pelvic cul-de-sac. Transvaginal technique was performed to assess early pregnancy. COMPARISON:  None. FINDINGS: Intrauterine gestational sac: Single Yolk sac:  Yes Embryo:  No Cardiac Activity: No Heart Rate:   bpm MSD: 12.9  mm   6 w   1  d Maternal uterus/adnexae: Subchorionic hemorrhage: None Right ovary: Normal Left ovary: Normal Other :None Free fluid:  None IMPRESSION: 1. Single intrauterine gestational sac containing yolk sac but no fetal pole, or cardiac activity yet visualized. Recommend follow-up quantitative  B-HCG levels and follow-up US in 14 days to confirm and assess viability. This recommendation follows SRU consensus guidelines: Diagnostic Criteria for Nonviable Pregnancy Early in the First Trimester. [place] Med [id number]. Electronically Signed   By: Kerby Moors M.D.   On: 10/14/2015 09:39   US Ob Transvaginal  10/14/2015  CLINICAL DATA:  Early pregnancy.  Evaluate viability. EXAM: OBSTETRIC <14 WK [place] AND TRANSVAGINAL OB US TECHNIQUE: Both transabdominal and transvaginal ultrasound examinations were performed for complete evaluation of the gestation as well as the maternal uterus, adnexal regions, and pelvic cul-de-sac. Transvaginal technique was performed to assess early pregnancy. COMPARISON:  None. FINDINGS: Intrauterine gestational sac: Single Yolk sac:  Yes Embryo:  No Cardiac Activity: No Heart Rate:   bpm MSD: 12.9  mm   6 w   1  d Maternal uterus/adnexae: Subchorionic hemorrhage: None Right ovary: Normal Left ovary: Normal Other :None Free fluid:  None IMPRESSION: 1. Single intrauterine gestational sac containing yolk sac but no fetal pole, or cardiac activity yet visualized. Recommend follow-up quantitative B-HCG levels and follow-up US in 14 days to confirm and assess viability. This recommendation follows SRU consensus guidelines: Diagnostic Criteria for Nonviable Pregnancy Early in the First Trimester. [employer] Med [id number]. Electronically Signed   By: Kerby Moors M.D.   On: 10/14/2015 09:39    ASSESSMENT No diagnosis found.  PLAN  Patient advised to start/continue taking prenatal vitamins Pregnancy confirmation letter given Scheduled viability ultrasound in 10 days Larey Days, CNM  10/14/2015  10:28 AM

## 2015-10-20 ENCOUNTER — Inpatient Hospital Stay (HOSPITAL_COMMUNITY)
Admission: AD | Admit: 2015-10-20 | Discharge: 2015-10-20 | Disposition: A | Discharge status: Home / Self Care | Payer: Medicaid Other | Source: Ambulatory Visit | Attending: Obstetrics and Gynecology | Admitting: Obstetrics and Gynecology

## 2015-10-20 ENCOUNTER — Inpatient Hospital Stay (HOSPITAL_COMMUNITY): Payer: Medicaid Other

## 2015-10-20 ENCOUNTER — Encounter (HOSPITAL_COMMUNITY): Payer: Self-pay | Admitting: *Deleted

## 2015-10-20 DIAGNOSIS — R102 Pelvic and perineal pain: Secondary | ICD-10-CM | POA: Insufficient documentation

## 2015-10-20 DIAGNOSIS — Z3A08 8 weeks gestation of pregnancy: Secondary | ICD-10-CM | POA: Insufficient documentation

## 2015-10-20 DIAGNOSIS — Z880 Allergy status to penicillin: Secondary | ICD-10-CM | POA: Insufficient documentation

## 2015-10-20 DIAGNOSIS — R109 Unspecified abdominal pain: Secondary | ICD-10-CM | POA: Diagnosis present

## 2015-10-20 DIAGNOSIS — O219 Vomiting of pregnancy, unspecified: Secondary | ICD-10-CM

## 2015-10-20 DIAGNOSIS — O210 Mild hyperemesis gravidarum: Secondary | ICD-10-CM | POA: Insufficient documentation

## 2015-10-20 DIAGNOSIS — Z3491 Encounter for supervision of normal pregnancy, unspecified, first trimester: Secondary | ICD-10-CM

## 2015-10-20 DIAGNOSIS — O26891 Other specified pregnancy related conditions, first trimester: Secondary | ICD-10-CM

## 2015-10-20 HISTORY — DX: Urinary tract infection, site not specified: N39.0

## 2015-10-20 HISTORY — DX: Chlamydial infection, unspecified: A74.9

## 2015-10-20 HISTORY — DX: Gonococcal infection, unspecified: A54.9

## 2015-10-20 LAB — URINALYSIS, ROUTINE W REFLEX MICROSCOPIC
BILIRUBIN URINE: NEGATIVE
Glucose, UA: NEGATIVE mg/dL
Hgb urine dipstick: NEGATIVE
KETONES UR: 15 mg/dL — AB
LEUKOCYTES UA: NEGATIVE
NITRITE: NEGATIVE
PROTEIN: NEGATIVE mg/dL
Specific Gravity, Urine: 1.025 (ref 1.005–1.030)
pH: 5.5 (ref 5.0–8.0)

## 2015-10-20 LAB — COMPREHENSIVE METABOLIC PANEL
ALK PHOS: 78 U/L (ref 38–126)
ALT: 21 U/L (ref 14–54)
ANION GAP: 8 (ref 5–15)
AST: 17 U/L (ref 15–41)
Albumin: 4.2 g/dL (ref 3.5–5.0)
BUN: 7 mg/dL (ref 6–20)
CALCIUM: 9.2 mg/dL (ref 8.9–10.3)
CO2: 22 mmol/L (ref 22–32)
CREATININE: 0.59 mg/dL (ref 0.44–1.00)
Chloride: 106 mmol/L (ref 101–111)
Glucose, Bld: 80 mg/dL (ref 65–99)
Potassium: 3.6 mmol/L (ref 3.5–5.1)
SODIUM: 136 mmol/L (ref 135–145)
TOTAL PROTEIN: 7.4 g/dL (ref 6.5–8.1)
Total Bilirubin: 0.8 mg/dL (ref 0.3–1.2)

## 2015-10-20 LAB — CBC
HCT: 38.3 % (ref 36.0–46.0)
HEMOGLOBIN: 13.2 g/dL (ref 12.0–15.0)
MCH: 27.8 pg (ref 26.0–34.0)
MCHC: 34.5 g/dL (ref 30.0–36.0)
MCV: 80.6 fL (ref 78.0–100.0)
PLATELETS: 313 10*3/uL (ref 150–400)
RBC: 4.75 MIL/uL (ref 3.87–5.11)
RDW: 13.2 % (ref 11.5–15.5)
WBC: 15.2 10*3/uL — AB (ref 4.0–10.5)

## 2015-10-20 MED ORDER — PROMETHAZINE HCL 25 MG PO TABS: 12.5000 mg | ORAL_TABLET | Freq: Four times a day (QID) | ORAL | Status: DC | PRN

## 2015-10-20 MED ORDER — DEXTROSE IN LACTATED RINGERS 5 % IV SOLN
25.0000 mg | Freq: Once | INTRAVENOUS | Status: AC
  Administered 2015-10-20: 25 mg via INTRAVENOUS
  Filled 2015-10-20: qty 1

## 2015-10-20 MED ORDER — DOXYLAMINE-PYRIDOXINE 10-10 MG PO TBEC: DELAYED_RELEASE_TABLET | ORAL | Status: DC

## 2015-10-20 NOTE — MAU Provider Note (Signed)
Chief Complaint: Emesis During Pregnancy and Abdominal Pain   First Provider Initiated Contact with Patient 10/20/15 1202      SUBJECTIVE HPI: PARMIS Cisneros is a 25 y.o. G1P0 at [redacted]w[redacted]d by LMP who presents to maternity admissions reporting abdominal pain and nausea with vomiting x 5 days, reporting she cannot keep down any food or fluids.  Her pain is intermittent, made worse with coughing, sharp and low in her pelvis radiating into her rectum.  She has not tried any treatments for her pain.  For nausea, she has tried ginger ale and saltine crackers which helped at first but she is not keeping these down now.  She is not taking any medications except her prenatal vitamin.  She had [place] on 5/31 showing gestational sac and yolk sac but no fetal pole.  She is concerned about the pregnancy because of her vomiting and pain. She denies vaginal bleeding, vaginal itching/burning, urinary symptoms, h/a, dizziness, or fever/chills.     HPI  Past Medical History  Diagnosis Date  . Bacterial vaginosis   . Chlamydia   . Gonorrhea   . UTI (lower urinary tract infection)    Past Surgical History  Procedure Laterality Date  . Wisdom tooth extraction     Social History   Social History  . Marital Status: Single    Spouse Name: N/A  . Number of Children: N/A  . Years of Education: N/A   Occupational History  . Not on file.   Social History Main Topics  . Smoking status: Never Smoker   . Smokeless tobacco: Not on file  . Alcohol Use: No  . Drug Use: No  . Sexual Activity: Yes    Birth Control/ Protection: None   Other Topics Concern  . Not on file   Social History Narrative   No current facility-administered medications on file prior to encounter.   Current Outpatient Prescriptions on File Prior to Encounter  Medication Sig Dispense Refill  . metroNIDAZOLE (FLAGYL) 500 MG tablet Take 1 tablet (500 mg total) by mouth 2 (two) times daily. (Patient not taking: Reported on 10/20/2015) 14  tablet 0  . Prenatal Vit-Fe Fumarate-FA (PRENATAL MULTIVITAMIN) TABS tablet Take 1 tablet by mouth daily at 12 noon.     Allergies  Allergen Reactions  . Amoxicillin Hives    Has patient had a PCN reaction causing immediate rash, facial/tongue/throat swelling, SOB or lightheadedness with hypotension: Yes Has patient had a PCN reaction causing severe rash involving mucus membranes or skin necrosis: Yes Has patient had a PCN reaction that required hospitalization Yes Has patient had a PCN reaction occurring within the last 10 years: No If all of the above answers are "NO", then may proceed with Cephalosporin use.  Marland Kitchen Penicillins Hives    Has patient had a PCN reaction causing immediate rash, facial/tongue/throat swelling, SOB or lightheadedness with hypotension: yes Has patient had a PCN reaction causing severe rash involving mucus membranes or skin necrosis: Yes Has patient had a PCN reaction that required hospitalization Yes Has patient had a PCN reaction occurring within the last 10 years: No If all of the above answers are "NO", then may proceed with Cephalosporin use.     ROS:  Review of Systems  Constitutional: Negative for fever, chills and fatigue.  Respiratory: Negative for shortness of breath.   Cardiovascular: Negative for chest pain.  Gastrointestinal: Positive for nausea, vomiting and abdominal pain.  Genitourinary: Positive for pelvic pain. Negative for dysuria, flank pain, vaginal bleeding, vaginal  discharge, difficulty urinating and vaginal pain.  Neurological: Negative for dizziness and headaches.  Psychiatric/Behavioral: Negative.      I have reviewed patient's Past Medical Hx, Surgical Hx, Family Hx, Social Hx, medications and allergies.   Physical Exam   Patient Vitals for the past 24 hrs:  BP Temp Temp src Pulse Resp Height Weight  10/20/15 1028 121/75 mmHg 98.3 F (36.8 C) Oral 84 18 5\' 5"  (1.651 m) 236 lb (107.049 kg)   Constitutional: Well-developed,  well-nourished female in no acute distress.  Cardiovascular: normal rate Respiratory: normal effort GI: Abd soft, non-tender. Pos BS x 4 MS: Extremities nontender, no edema, normal ROM Neurologic: Alert and oriented x 4.  GU: Neg CVAT.  LAB RESULTS Results for orders placed or performed during the hospital encounter of 10/20/15 (from the past 24 hour(s))  Urinalysis, Routine w reflex microscopic (not at [facility])     Status: Abnormal   Collection Time: 10/20/15 10:33 AM  Result Value Ref Range   Color, Urine YELLOW YELLOW   APPearance CLEAR CLEAR   Specific Gravity, Urine 1.025 1.005 - 1.030   pH 5.5 5.0 - 8.0   Glucose, UA NEGATIVE NEGATIVE mg/dL   Hgb urine dipstick NEGATIVE NEGATIVE   Bilirubin Urine NEGATIVE NEGATIVE   Ketones, ur 15 (A) NEGATIVE mg/dL   Protein, ur NEGATIVE NEGATIVE mg/dL   Nitrite NEGATIVE NEGATIVE   Leukocytes, UA NEGATIVE NEGATIVE  CBC     Status: Abnormal   Collection Time: 10/20/15 12:42 PM  Result Value Ref Range   WBC 15.2 (H) 4.0 - 10.5 K/uL   RBC 4.75 3.87 - 5.11 MIL/uL   Hemoglobin 13.2 12.0 - 15.0 g/dL   HCT 38.3 36.0 - 46.0 %   MCV 80.6 78.0 - 100.0 fL   MCH 27.8 26.0 - 34.0 pg   MCHC 34.5 30.0 - 36.0 g/dL   RDW 13.2 11.5 - 15.5 %   Platelets 313 150 - 400 K/uL  Comprehensive metabolic panel     Status: None   Collection Time: 10/20/15 12:42 PM  Result Value Ref Range   Sodium 136 135 - 145 mmol/L   Potassium 3.6 3.5 - 5.1 mmol/L   Chloride 106 101 - 111 mmol/L   CO2 22 22 - 32 mmol/L   Glucose, Bld 80 65 - 99 mg/dL   BUN 7 6 - 20 mg/dL   Creatinine, Ser 0.59 0.44 - 1.00 mg/dL   Calcium 9.2 8.9 - 10.3 mg/dL   Total Protein 7.4 6.5 - 8.1 g/dL   Albumin 4.2 3.5 - 5.0 g/dL   AST 17 15 - 41 U/L   ALT 21 14 - 54 U/L   Alkaline Phosphatase 78 38 - 126 U/L   Total Bilirubin 0.8 0.3 - 1.2 mg/dL   GFR calc non Af Amer >60 >60 mL/min   GFR calc Af Amer >60 >60 mL/min   Anion gap 8 5 - 15       IMAGING US Ob Comp Less 14 Wks  10/14/2015   CLINICAL DATA:  Early pregnancy.  Evaluate viability. EXAM: OBSTETRIC <14 WK [place] AND TRANSVAGINAL OB US TECHNIQUE: Both transabdominal and transvaginal ultrasound examinations were performed for complete evaluation of the gestation as well as the maternal uterus, adnexal regions, and pelvic cul-de-sac. Transvaginal technique was performed to assess early pregnancy. COMPARISON:  None. FINDINGS: Intrauterine gestational sac: Single Yolk sac:  Yes Embryo:  No Cardiac Activity: No Heart Rate:   bpm MSD: 12.9  mm   6 w  1  d Maternal uterus/adnexae: Subchorionic hemorrhage: None Right ovary: Normal Left ovary: Normal Other :None Free fluid:  None IMPRESSION: 1. Single intrauterine gestational sac containing yolk sac but no fetal pole, or cardiac activity yet visualized. Recommend follow-up quantitative B-HCG levels and follow-up US in 14 days to confirm and assess viability. This recommendation follows SRU consensus guidelines: Diagnostic Criteria for Nonviable Pregnancy Early in the First Trimester. [place] Med [id number]. Electronically Signed   By: Kerby Moors M.D.   On: 10/14/2015 09:39   US Ob Transvaginal  10/20/2015  CLINICAL DATA:  Pregnant, cramping x5 days EXAM: TRANSVAGINAL OB ULTRASOUND TECHNIQUE: Transvaginal ultrasound was performed for complete evaluation of the gestation as well as the maternal uterus, adnexal regions, and pelvic cul-de-sac. COMPARISON:  None. FINDINGS: Intrauterine gestational sac: Single Yolk sac:  Present Embryo:  Present Cardiac Activity: Present Heart Rate: 123 bpm CRL:   8  mm   6 w 5 d                  [place] EDC: 06/09/2016 Subchorionic hemorrhage:  None visualized. Maternal uterus/adnexae: Bilateral ovaries are within normal limits. No free fluid. IMPRESSION: Single live intrauterine gestation, with estimated gestational age [redacted] weeks 5 days by crown-rump length. Electronically Signed   By: Julian Hy M.D.   On: 10/20/2015 17:10   US Ob Transvaginal  10/14/2015   CLINICAL DATA:  Early pregnancy.  Evaluate viability. EXAM: OBSTETRIC <14 WK [place] AND TRANSVAGINAL OB US TECHNIQUE: Both transabdominal and transvaginal ultrasound examinations were performed for complete evaluation of the gestation as well as the maternal uterus, adnexal regions, and pelvic cul-de-sac. Transvaginal technique was performed to assess early pregnancy. COMPARISON:  None. FINDINGS: Intrauterine gestational sac: Single Yolk sac:  Yes Embryo:  No Cardiac Activity: No Heart Rate:   bpm MSD: 12.9  mm   6 w   1  d Maternal uterus/adnexae: Subchorionic hemorrhage: None Right ovary: Normal Left ovary: Normal Other :None Free fluid:  None IMPRESSION: 1. Single intrauterine gestational sac containing yolk sac but no fetal pole, or cardiac activity yet visualized. Recommend follow-up quantitative B-HCG levels and follow-up US in 14 days to confirm and assess viability. This recommendation follows SRU consensus guidelines: Diagnostic Criteria for Nonviable Pregnancy Early in the First Trimester. [employer] Med [id number]. Electronically Signed   By: Kerby Moors M.D.   On: 10/14/2015 09:39    MAU Management/MDM: Ordered labs and reviewed results.  Pt reports improvement after Phenergan 25 mg in 1000 ml of D5.  Viable IUP on [place].  D/C home with Phenergan Rx and Diclegis Rx.  Keep scheduled appt in [facility].  Pt stable at time of discharge.  ASSESSMENT 1. Nausea and vomiting during pregnancy prior to [redacted] weeks gestation   2. Pelvic pain affecting pregnancy in first trimester, antepartum   3. Normal IUP (intrauterine pregnancy) on prenatal ultrasound, first trimester     PLAN Discharge home   Medication List    STOP taking these medications        metroNIDAZOLE 500 MG tablet  Commonly known as:  FLAGYL      TAKE these medications        Doxylamine-Pyridoxine 10-10 MG Tbec  Commonly known as:  DICLEGIS  Take 2 tabs at bedtime. If needed, add another tab in the morning. If needed, add another  tab in the afternoon, up to 4 tabs/day.     prenatal multivitamin Tabs tablet  Take 1 tablet by  mouth daily at 12 noon.     promethazine 25 MG tablet  Commonly known as:  PHENERGAN  Take 0.5-1 tablets (12.5-25 mg total) by mouth every 6 (six) hours as needed for nausea.       Follow-up Information    Follow up with [facility].   Specialty:  Obstetrics and Gynecology   Why:  As scheduled on Friday, Return to MAU as needed for emergencies   Contact information:   [place] Certified Nurse-Midwife 10/20/2015  5:47 PM

## 2015-10-20 NOTE — Discharge Instructions (Signed)

## 2015-10-20 NOTE — MAU Note (Addendum)
Pt states she hasn't been able to eat for the past 5 days, unable to hold liquids down.  States lower abd pain continues, also pelvic pain.  Denies bleeding, no diarrhea.

## 2015-10-20 NOTE — MAU Note (Signed)
Pt insisting on having ice chips.  Explained to pt that she should not take anything by mouth @ this time due to her C/O vomiting everything, including liquids.  Pt states she has to have something, she cannot do this.  Ice chips given.

## 2015-10-25 ENCOUNTER — Inpatient Hospital Stay (HOSPITAL_COMMUNITY)
Admission: AD | Admit: 2015-10-25 | Discharge: 2015-10-25 | Disposition: A | Discharge status: Home / Self Care | Payer: Medicaid Other | Source: Ambulatory Visit | Attending: Obstetrics and Gynecology | Admitting: Obstetrics and Gynecology

## 2015-10-25 ENCOUNTER — Encounter (HOSPITAL_COMMUNITY): Payer: Self-pay | Admitting: *Deleted

## 2015-10-25 DIAGNOSIS — O210 Mild hyperemesis gravidarum: Secondary | ICD-10-CM | POA: Diagnosis not present

## 2015-10-25 DIAGNOSIS — Z91199 Patient's noncompliance with other medical treatment and regimen due to unspecified reason: Secondary | ICD-10-CM

## 2015-10-25 DIAGNOSIS — Z9119 Patient's noncompliance with other medical treatment and regimen: Secondary | ICD-10-CM | POA: Insufficient documentation

## 2015-10-25 DIAGNOSIS — E876 Hypokalemia: Secondary | ICD-10-CM | POA: Insufficient documentation

## 2015-10-25 DIAGNOSIS — Z3A01 Less than 8 weeks gestation of pregnancy: Secondary | ICD-10-CM | POA: Diagnosis not present

## 2015-10-25 DIAGNOSIS — O09891 Supervision of other high risk pregnancies, first trimester: Secondary | ICD-10-CM

## 2015-10-25 DIAGNOSIS — Z880 Allergy status to penicillin: Secondary | ICD-10-CM | POA: Diagnosis not present

## 2015-10-25 LAB — COMPREHENSIVE METABOLIC PANEL
ALT: 15 U/L (ref 14–54)
AST: 14 U/L — ABNORMAL LOW (ref 15–41)
Albumin: 4.1 g/dL (ref 3.5–5.0)
Alkaline Phosphatase: 76 U/L (ref 38–126)
Anion gap: 9 (ref 5–15)
BUN: 6 mg/dL (ref 6–20)
CHLORIDE: 106 mmol/L (ref 101–111)
CO2: 22 mmol/L (ref 22–32)
CREATININE: 0.61 mg/dL (ref 0.44–1.00)
Calcium: 9.5 mg/dL (ref 8.9–10.3)
Glucose, Bld: 83 mg/dL (ref 65–99)
Potassium: 3.4 mmol/L — ABNORMAL LOW (ref 3.5–5.1)
Sodium: 137 mmol/L (ref 135–145)
Total Bilirubin: 0.9 mg/dL (ref 0.3–1.2)
Total Protein: 8.2 g/dL — ABNORMAL HIGH (ref 6.5–8.1)

## 2015-10-25 LAB — URINALYSIS, ROUTINE W REFLEX MICROSCOPIC
GLUCOSE, UA: NEGATIVE mg/dL
HGB URINE DIPSTICK: NEGATIVE
KETONES UR: 40 mg/dL — AB
Nitrite: NEGATIVE
PROTEIN: NEGATIVE mg/dL
Specific Gravity, Urine: 1.025 (ref 1.005–1.030)
pH: 6 (ref 5.0–8.0)

## 2015-10-25 LAB — URINE MICROSCOPIC-ADD ON

## 2015-10-25 MED ORDER — PROMETHAZINE HCL 25 MG PO TABS: 12.5000 mg | ORAL_TABLET | Freq: Four times a day (QID) | ORAL | Status: DC | PRN

## 2015-10-25 MED ORDER — POTASSIUM CHLORIDE 2 MEQ/ML IV SOLN
Freq: Once | INTRAVENOUS | Status: AC
  Administered 2015-10-25: 11:00:00 via INTRAVENOUS
  Filled 2015-10-25: qty 1000

## 2015-10-25 MED ORDER — FAMOTIDINE IN NACL 20-0.9 MG/50ML-% IV SOLN
20.0000 mg | Freq: Once | INTRAVENOUS | Status: AC
  Administered 2015-10-25: 20 mg via INTRAVENOUS
  Filled 2015-10-25: qty 50

## 2015-10-25 MED ORDER — RANITIDINE HCL 150 MG PO TABS
150.0000 mg | ORAL_TABLET | Freq: Two times a day (BID) | ORAL | Status: DC
Start: 2015-10-25 — End: 2016-03-30

## 2015-10-25 MED ORDER — DEXTROSE IN LACTATED RINGERS 5 % IV SOLN
25.0000 mg | Freq: Once | INTRAVENOUS | Status: AC
  Administered 2015-10-25: 25 mg via INTRAVENOUS
  Filled 2015-10-25: qty 1

## 2015-10-25 NOTE — Discharge Instructions (Signed)
Eating Plan for Hyperemesis Gravidarum Severe cases of hyperemesis gravidarum can lead to dehydration and malnutrition. The hyperemesis eating plan is one way to lessen the symptoms of nausea and vomiting. It is often used with prescribed medicines to control your symptoms.  WHAT CAN I DO TO RELIEVE MY SYMPTOMS? Listen to your body. Everyone is different and has different preferences. Find what works best for you. Some of the following things may help:  Eat and drink slowly.  Eat 5-6 small meals daily instead of 3 large meals.   Eat crackers before you get out of bed in the morning.   Starchy foods are usually well tolerated (such as cereal, toast, bread, potatoes, pasta, rice, and pretzels).   Ginger may help with nausea. Add  tsp ground ginger to hot tea or choose ginger tea.   Try drinking 100% fruit juice or an electrolyte drink.  Continue to take your prenatal vitamins as directed by your health care provider. If you are having trouble taking your prenatal vitamins, talk with your health care provider about different options.  Include at least 1 serving of protein with your meals and snacks (such as meats or poultry, beans, nuts, eggs, or yogurt). Try eating a protein-rich snack before bed (such as cheese and crackers or a half [place] or peanut butter sandwich). WHAT THINGS SHOULD I AVOID TO REDUCE MY SYMPTOMS? The following things may help reduce your symptoms:  Avoid foods with strong smells. Try eating meals in well-ventilated areas that are free of odors.  Avoid drinking water or other beverages with meals. Try not to drink anything less than 30 minutes before and after meals.  Avoid drinking more than 1 cup of fluid at a time.  Avoid fried or high-fat foods, such as butter and cream sauces.  Avoid spicy foods.  Avoid skipping meals the best you can. Nausea can be more intense on an empty stomach. If you cannot tolerate food at that time, do not force it. Try sucking on  ice chips or other frozen items and make up the calories later.  Avoid lying down within 2 hours after eating.   This information is not intended to replace advice given to you by your health care provider. Make sure you discuss any questions you have with your health care provider.   Document Released: 02/27/2007 Document Revised: 05/07/2013 Document Reviewed: 03/06/2013 Elsevier Interactive Patient Education 2016 [place] Medications in Pregnancy   Acne:  Benzoyl Peroxide  Salicylic Acid   Backache/Headache:  Tylenol: 2 regular strength every 4 hours OR        2 Extra strength every 6 hours   Colds/Coughs/Allergies:  Benadryl (alcohol free) 25 mg every 6 hours as needed  Breath right strips  Claritin  Cepacol throat lozenges  Chloraseptic throat spray  Cold-Eeze- up to three times per day  Cough drops, alcohol free  Flonase (by prescription only)  Guaifenesin  Mucinex  Robitussin DM (plain only, alcohol free)  Saline nasal spray/drops  Sudafed (pseudoephedrine) & Actifed * use only after [redacted] weeks gestation and if you do not have high blood pressure  Tylenol  Vicks Vaporub  Zinc lozenges  Zyrtec   Constipation:  Colace  Ducolax suppositories  Fleet enema  Glycerin suppositories  Metamucil  Milk of magnesia  Miralax  Senokot  Smooth move tea   Diarrhea:  Kaopectate  Imodium A-D   *NO pepto Bismol   Hemorrhoids:  Anusol  Anusol HC  Preparation H  Tucks  Indigestion:  Tums  Maalox  Mylanta  Zantac  Pepcid   Insomnia:  Benadryl (alcohol free) 25mg  every 6 hours as needed  Tylenol PM  Unisom, no Gelcaps   Leg Cramps:  Tums  MagGel   Nausea/Vomiting:  Bonine  Dramamine  Emetrol  Ginger extract  Sea bands  Meclizine  Nausea medication to take during pregnancy:  Unisom (doxylamine succinate 25 mg tablets) Take one tablet daily at bedtime. If symptoms are not adequately controlled, the dose can be increased to a  maximum recommended dose of two tablets daily (1/2 tablet in the morning, 1/2 tablet mid-afternoon and one at bedtime).  Vitamin B6 100mg  tablets. Take one tablet twice a day (up to 200 mg per day).   Skin Rashes:  Aveeno products  Benadryl cream or 25mg  every 6 hours as needed  Calamine Lotion  1% cortisone cream   Yeast infection:  Gyne-lotrimin 7  Monistat 7    **If taking multiple medications, please check labels to avoid duplicating the same active ingredients  **take medication as directed on the label  ** Do not exceed 4000 mg of tylenol in 24 hours  **Do not take medications that contain aspirin or ibuprofen

## 2015-10-25 NOTE — MAU Provider Note (Signed)
History     CSN: [id number]  Arrival date and time: 10/25/15 [id number]   First Provider Initiated Contact with Patient 10/25/15 [phone]      Chief Complaint  Patient presents with  . Emesis During Pregnancy   HPI   Ms.Melanie Cisneros is a 25 y.o. female G1P0 @ [redacted]w[redacted]d presenting to MAU with worsening N/V/ She was seen here on 6/6 for the same symptoms and sent home with Diglegis and phenergan. She did not fill these medications due to no insurance, says she cannot afford to pay for them. She has applied for medicaid, however she has not received her card.   Patient had recent US with confirmed IUP.  She has vomited 3-5 times in the last 24 hours.  Chart review: Weight 6/6: 236 lbs  6/11: 228 lbs  OB History    Gravida Para Term Preterm AB TAB SAB Ectopic Multiple Living   1               Past Medical History  Diagnosis Date  . Bacterial vaginosis   . Chlamydia   . Gonorrhea   . UTI (lower urinary tract infection)     Past Surgical History  Procedure Laterality Date  . Wisdom tooth extraction      History reviewed. No pertinent family history.  Social History  Substance Use Topics  . Smoking status: Never Smoker   . Smokeless tobacco: None  . Alcohol Use: No    Allergies:  Allergies  Allergen Reactions  . Amoxicillin Hives    Has patient had a PCN reaction causing immediate rash, facial/tongue/throat swelling, SOB or lightheadedness with hypotension: Yes Has patient had a PCN reaction causing severe rash involving mucus membranes or skin necrosis: Yes Has patient had a PCN reaction that required hospitalization Yes Has patient had a PCN reaction occurring within the last 10 years: No If all of the above answers are "NO", then may proceed with Cephalosporin use.  Marland Kitchen Penicillins Hives    Has patient had a PCN reaction causing immediate rash, facial/tongue/throat swelling, SOB or lightheadedness with hypotension: yes Has patient had a PCN reaction causing severe rash  involving mucus membranes or skin necrosis: Yes Has patient had a PCN reaction that required hospitalization Yes Has patient had a PCN reaction occurring within the last 10 years: No If all of the above answers are "NO", then may proceed with Cephalosporin use.     Prescriptions prior to admission  Medication Sig Dispense Refill Last Dose  . Doxylamine-Pyridoxine (DICLEGIS) 10-10 MG TBEC Take 2 tabs at bedtime. If needed, add another tab in the morning. If needed, add another tab in the afternoon, up to 4 tabs/day. 100 tablet 5   . promethazine (PHENERGAN) 25 MG tablet Take 0.5-1 tablets (12.5-25 mg total) by mouth every 6 (six) hours as needed for nausea. 30 tablet 0    No results found for this or any previous visit (from the past 48 hour(s)).   Review of Systems  Constitutional: Negative for fever.  Gastrointestinal: Positive for heartburn, nausea and vomiting.   Physical Exam   Blood pressure 117/70, pulse 57, temperature 97.3 F (36.3 C), temperature source Oral, resp. rate 18, last menstrual period 08/23/2015.  Physical Exam  Constitutional: She is oriented to person, place, and time. She appears well-developed and well-nourished.  Respiratory: Effort normal.  Musculoskeletal: Normal range of motion.  Neurological: She is alert and oriented to person, place, and time.  Skin: Skin is warm. She is not  diaphoretic.  Psychiatric: Her behavior is normal.    MAU Course  Procedures  None  MDM  MVI X 1 Phenergan infusion in D5 X 1 Potassium 10 meq X 1 Pepcid 20 mg X 1 bag  PO challenge: pass.    Assessment and Plan   A:  1. Hyperemesis arising during pregnancy   2. Hypokalemia   3. Non-compliant pregnant patient, first trimester     P:  Discharge home in stable condition Changed diclegis to Vitamin B6 and unisome Rx: Phenergan ($4 at [facility]) Small, frequent meals No fried foods.  Return to MAU if symptoms worsen  Start prenatal care.     Lezlie Lye, NP 10/25/2015 8:44 AM

## 2015-10-25 NOTE — MAU Note (Signed)
Pt came in by EMS with C/O nausea & vomiting, states she has some abd pain with vomiting.  Feels lightheaded & dehydrated.  Was prescribed meds at last visit, has not been able to get them filled because of issues with medicaid.

## 2015-10-28 ENCOUNTER — Encounter: Payer: Self-pay | Admitting: *Deleted

## 2015-11-02 ENCOUNTER — Inpatient Hospital Stay (HOSPITAL_COMMUNITY)
Admission: AD | Admit: 2015-11-02 | Discharge: 2015-11-02 | Disposition: A | Discharge status: Home / Self Care | Payer: Medicaid Other | Source: Ambulatory Visit | Attending: Obstetrics & Gynecology | Admitting: Obstetrics & Gynecology

## 2015-11-02 ENCOUNTER — Encounter (HOSPITAL_COMMUNITY): Payer: Self-pay | Admitting: *Deleted

## 2015-11-02 DIAGNOSIS — O99611 Diseases of the digestive system complicating pregnancy, first trimester: Secondary | ICD-10-CM | POA: Diagnosis not present

## 2015-11-02 DIAGNOSIS — Z880 Allergy status to penicillin: Secondary | ICD-10-CM | POA: Insufficient documentation

## 2015-11-02 DIAGNOSIS — O99281 Endocrine, nutritional and metabolic diseases complicating pregnancy, first trimester: Secondary | ICD-10-CM | POA: Insufficient documentation

## 2015-11-02 DIAGNOSIS — O219 Vomiting of pregnancy, unspecified: Secondary | ICD-10-CM

## 2015-11-02 DIAGNOSIS — O26891 Other specified pregnancy related conditions, first trimester: Secondary | ICD-10-CM

## 2015-11-02 DIAGNOSIS — E876 Hypokalemia: Secondary | ICD-10-CM | POA: Diagnosis not present

## 2015-11-02 DIAGNOSIS — O210 Mild hyperemesis gravidarum: Secondary | ICD-10-CM | POA: Diagnosis not present

## 2015-11-02 DIAGNOSIS — Z3A08 8 weeks gestation of pregnancy: Secondary | ICD-10-CM | POA: Diagnosis not present

## 2015-11-02 DIAGNOSIS — R12 Heartburn: Secondary | ICD-10-CM | POA: Diagnosis not present

## 2015-11-02 DIAGNOSIS — K117 Disturbances of salivary secretion: Secondary | ICD-10-CM | POA: Diagnosis not present

## 2015-11-02 LAB — COMPREHENSIVE METABOLIC PANEL
ALT: 13 U/L — AB (ref 14–54)
AST: 16 U/L (ref 15–41)
Albumin: 4.5 g/dL (ref 3.5–5.0)
Alkaline Phosphatase: 74 U/L (ref 38–126)
Anion gap: 10 (ref 5–15)
BILIRUBIN TOTAL: 0.9 mg/dL (ref 0.3–1.2)
BUN: 6 mg/dL (ref 6–20)
CHLORIDE: 104 mmol/L (ref 101–111)
CO2: 24 mmol/L (ref 22–32)
CREATININE: 0.65 mg/dL (ref 0.44–1.00)
Calcium: 9.9 mg/dL (ref 8.9–10.3)
Glucose, Bld: 87 mg/dL (ref 65–99)
POTASSIUM: 3.2 mmol/L — AB (ref 3.5–5.1)
Sodium: 138 mmol/L (ref 135–145)
TOTAL PROTEIN: 8.5 g/dL — AB (ref 6.5–8.1)

## 2015-11-02 LAB — CBC
HCT: 41.4 % (ref 36.0–46.0)
Hemoglobin: 15.1 g/dL — ABNORMAL HIGH (ref 12.0–15.0)
MCH: 28.8 pg (ref 26.0–34.0)
MCHC: 36.5 g/dL — ABNORMAL HIGH (ref 30.0–36.0)
MCV: 78.9 fL (ref 78.0–100.0)
PLATELETS: 304 10*3/uL (ref 150–400)
RBC: 5.25 MIL/uL — ABNORMAL HIGH (ref 3.87–5.11)
RDW: 12.9 % (ref 11.5–15.5)
WBC: 15 10*3/uL — ABNORMAL HIGH (ref 4.0–10.5)

## 2015-11-02 LAB — URINALYSIS, ROUTINE W REFLEX MICROSCOPIC
Glucose, UA: NEGATIVE mg/dL
HGB URINE DIPSTICK: NEGATIVE
Ketones, ur: >80 mg/dL — AB
Nitrite: NEGATIVE
PH: 5.5 (ref 5.0–8.0)
Protein, ur: NEGATIVE mg/dL
SPECIFIC GRAVITY, URINE: 1.02 (ref 1.005–1.030)

## 2015-11-02 LAB — URINE MICROSCOPIC-ADD ON

## 2015-11-02 MED ORDER — DEXTROSE IN LACTATED RINGERS 5 % IV SOLN
Freq: Once | INTRAVENOUS | Status: AC
  Administered 2015-11-02: 16:00:00 via INTRAVENOUS
  Filled 2015-11-02: qty 10

## 2015-11-02 MED ORDER — DOCUSATE SODIUM 100 MG PO CAPS: 100.0000 mg | ORAL_CAPSULE | Freq: Two times a day (BID) | ORAL | Status: DC

## 2015-11-02 MED ORDER — DOXYLAMINE-PYRIDOXINE 10-10 MG PO TBEC: DELAYED_RELEASE_TABLET | ORAL | Status: DC

## 2015-11-02 MED ORDER — POTASSIUM CHLORIDE CRYS ER 20 MEQ PO TBCR
30.0000 meq | EXTENDED_RELEASE_TABLET | Freq: Once | ORAL | Status: AC
  Administered 2015-11-02: 30 meq via ORAL
  Filled 2015-11-02: qty 1

## 2015-11-02 MED ORDER — GLYCOPYRROLATE 0.2 MG/ML IJ SOLN
0.1000 mg | Freq: Once | INTRAMUSCULAR | Status: AC
  Administered 2015-11-02: 0.2 mg via INTRAVENOUS
  Filled 2015-11-02: qty 0.5

## 2015-11-02 MED ORDER — FAMOTIDINE IN NACL 20-0.9 MG/50ML-% IV SOLN
20.0000 mg | Freq: Once | INTRAVENOUS | Status: AC
  Administered 2015-11-02: 20 mg via INTRAVENOUS
  Filled 2015-11-02: qty 50

## 2015-11-02 MED ORDER — LACTATED RINGERS IV BOLUS (SEPSIS)
1000.0000 mL | Freq: Once | INTRAVENOUS | Status: AC
  Administered 2015-11-02: 1000 mL via INTRAVENOUS

## 2015-11-02 MED ORDER — GLYCOPYRROLATE 1 MG PO TABS: 1.0000 mg | ORAL_TABLET | Freq: Three times a day (TID) | ORAL | Status: DC

## 2015-11-02 MED ORDER — PROMETHAZINE HCL 25 MG/ML IJ SOLN
25.0000 mg | Freq: Once | INTRAMUSCULAR | Status: AC
  Administered 2015-11-02: 25 mg via INTRAVENOUS
  Filled 2015-11-02: qty 1

## 2015-11-02 NOTE — Discharge Instructions (Signed)
Eating Plan for Hyperemesis Gravidarum Severe cases of hyperemesis gravidarum can lead to dehydration and malnutrition. The hyperemesis eating plan is one way to lessen the symptoms of nausea and vomiting. It is often used with prescribed medicines to control your symptoms.  WHAT CAN I DO TO RELIEVE MY SYMPTOMS? Listen to your body. Everyone is different and has different preferences. Find what works best for you. Some of the following things may help:  Eat and drink slowly.  Eat 5-6 small meals daily instead of 3 large meals.   Eat crackers before you get out of bed in the morning.   Starchy foods are usually well tolerated (such as cereal, toast, bread, potatoes, pasta, rice, and pretzels).   Ginger may help with nausea. Add  tsp ground ginger to hot tea or choose ginger tea.   Try drinking 100% fruit juice or an electrolyte drink.  Continue to take your prenatal vitamins as directed by your health care provider. If you are having trouble taking your prenatal vitamins, talk with your health care provider about different options.  Include at least 1 serving of protein with your meals and snacks (such as meats or poultry, beans, nuts, eggs, or yogurt). Try eating a protein-rich snack before bed (such as cheese and crackers or a half [place] or peanut butter sandwich). WHAT THINGS SHOULD I AVOID TO REDUCE MY SYMPTOMS? The following things may help reduce your symptoms:  Avoid foods with strong smells. Try eating meals in well-ventilated areas that are free of odors.  Avoid drinking water or other beverages with meals. Try not to drink anything less than 30 minutes before and after meals.  Avoid drinking more than 1 cup of fluid at a time.  Avoid fried or high-fat foods, such as butter and cream sauces.  Avoid spicy foods.  Avoid skipping meals the best you can. Nausea can be more intense on an empty stomach. If you cannot tolerate food at that time, do not force it. Try sucking on  ice chips or other frozen items and make up the calories later.  Avoid lying down within 2 hours after eating.   This information is not intended to replace advice given to you by your health care provider. Make sure you discuss any questions you have with your health care provider.   Document Released: 02/27/2007 Document Revised: 05/07/2013 Document Reviewed: 03/06/2013 Elsevier Interactive Patient Education 2016 [place] Medications in Pregnancy   Acne: Benzoyl Peroxide Salicylic Acid  Backache/Headache: Tylenol: 2 regular strength every 4 hours OR              2 Extra strength every 6 hours  Colds/Coughs/Allergies: Benadryl (alcohol free) 25 mg every 6 hours as needed Breath right strips Claritin Cepacol throat lozenges Chloraseptic throat spray Cold-Eeze- up to three times per day Cough drops, alcohol free Flonase (by prescription only) Guaifenesin Mucinex Robitussin DM (plain only, alcohol free) Saline nasal spray/drops Sudafed (pseudoephedrine) & Actifed ** use only after [redacted] weeks gestation and if you do not have high blood pressure Tylenol Vicks Vaporub Zinc lozenges Zyrtec   Constipation: Colace Ducolax suppositories Fleet enema Glycerin suppositories Metamucil Milk of magnesia Miralax Senokot Smooth move tea  Diarrhea: Kaopectate Imodium A-D  *NO pepto Bismol  Hemorrhoids: Anusol Anusol HC Preparation H Tucks  Indigestion: Tums Maalox Mylanta Zantac  Pepcid  Insomnia: Benadryl (alcohol free) 25mg  every 6 hours as needed Tylenol PM Unisom, no Gelcaps  Leg Cramps: Tums MagGel  Nausea/Vomiting:  Bonine Dramamine Emetrol Ginger extract Sea bands Meclizine  Nausea medication to take during pregnancy:  Unisom (doxylamine succinate 25 mg tablets) Take one tablet daily at bedtime. If symptoms are not adequately controlled, the dose can be increased to a maximum recommended dose of two tablets daily (1/2 tablet  in the morning, 1/2 tablet mid-afternoon and one at bedtime). Vitamin B6 100mg  tablets. Take one tablet twice a day (up to 200 mg per day).  Skin Rashes: Aveeno products Benadryl cream or 25mg  every 6 hours as needed Calamine Lotion 1% cortisone cream  Yeast infection: Gyne-lotrimin 7 Monistat 7  Gum/tooth pain: Anbesol  **If taking multiple medications, please check labels to avoid duplicating the same active ingredients **take medication as directed on the label ** Do not exceed 4000 mg of tylenol in 24 hours **Do not take medications that contain aspirin or ibuprofen

## 2015-11-02 NOTE — MAU Note (Signed)
Pt was unable to void even after pt states she got up and squatted at bedside.  Pt states she can not urinate at this time.  She states is hurts to bad.  Informed pt she needs to use bathroom next to her room to obtain clean catch specimen and wash her hands.  Treatment plan discussed with Pt and Mom.

## 2015-11-02 NOTE — MAU Note (Signed)
Arrived by EMS continuous N/V without relief with antiemetics.  Last taken last night.  Can't keep anything down.

## 2015-11-02 NOTE — MAU Provider Note (Signed)
History     CSN: [id number]  Arrival date and time: 11/02/15 1233   First Provider Initiated Contact with Patient 11/02/15 1316      Chief Complaint  Patient presents with  . Emesis During Pregnancy   HPI  Melanie Cisneros is a 25 y.o. G1P0 at [redacted]w[redacted]d who presents with n/v. Has had n/v throughout pregnancy. Taking phenergan & zantac at home. States the phenergan is not working. Last took the phenergan yesterday morning; has been taking orally. Reports vomiting 5 times today. Reports increase salivation & heartburn.  Denies abdominal pain or vaginal bleeding.   OB History    Gravida Para Term Preterm AB TAB SAB Ectopic Multiple Living   1         0      Past Medical History  Diagnosis Date  . Bacterial vaginosis   . Chlamydia   . Gonorrhea   . UTI (lower urinary tract infection)     Past Surgical History  Procedure Laterality Date  . Wisdom tooth extraction      History reviewed. No pertinent family history.  Social History  Substance Use Topics  . Smoking status: Never Smoker   . Smokeless tobacco: None  . Alcohol Use: No    Allergies:  Allergies  Allergen Reactions  . Amoxicillin Hives    Has patient had a PCN reaction causing immediate rash, facial/tongue/throat swelling, SOB or lightheadedness with hypotension: Yes Has patient had a PCN reaction causing severe rash involving mucus membranes or skin necrosis: Yes Has patient had a PCN reaction that required hospitalization Yes Has patient had a PCN reaction occurring within the last 10 years: No If all of the above answers are "NO", then may proceed with Cephalosporin use.  Marland Kitchen Penicillins Hives    Has patient had a PCN reaction causing immediate rash, facial/tongue/throat swelling, SOB or lightheadedness with hypotension: yes Has patient had a PCN reaction causing severe rash involving mucus membranes or skin necrosis: Yes Has patient had a PCN reaction that required hospitalization Yes Has patient had a PCN  reaction occurring within the last 10 years: No If all of the above answers are "NO", then may proceed with Cephalosporin use.     Prescriptions prior to admission  Medication Sig Dispense Refill Last Dose  . promethazine (PHENERGAN) 25 MG tablet Take 0.5-1 tablets (12.5-25 mg total) by mouth every 6 (six) hours as needed for nausea. 30 tablet 0   . ranitidine (ZANTAC) 150 MG tablet Take 1 tablet (150 mg total) by mouth 2 (two) times daily. 60 tablet 1     Review of Systems  Constitutional: Positive for weight loss. Negative for fever and chills.  HENT: Negative.   Gastrointestinal: Positive for heartburn, nausea, vomiting and constipation. Negative for abdominal pain and diarrhea.  Genitourinary: Negative.   Neurological: Negative for weakness.   Physical Exam   Blood pressure 128/80, pulse 99, temperature 98.3 F (36.8 C), temperature source Oral, resp. rate 18, weight 220 lb 3.2 oz (99.882 kg), last menstrual period 08/23/2015, SpO2 100 %.  Physical Exam  Nursing note and vitals reviewed. Constitutional: She is oriented to person, place, and time. She appears well-developed and well-nourished. No distress.  HENT:  Head: Normocephalic and atraumatic.  Eyes: Conjunctivae are normal. Right eye exhibits no discharge. Left eye exhibits no discharge. No scleral icterus.  Neck: Normal range of motion.  Cardiovascular: Normal rate, regular rhythm and normal heart sounds.   No murmur heard. Respiratory: Effort normal and breath sounds  normal. No respiratory distress. She has no wheezes.  GI: Soft. Bowel sounds are normal. She exhibits no distension. There is no tenderness. There is no rebound.  Neurological: She is alert and oriented to person, place, and time.  Skin: Skin is warm and dry. She is not diaphoretic.  Psychiatric: She has a normal mood and affect. Her behavior is normal. Judgment and thought content normal.    MAU Course  Procedures Results for orders placed or  performed during the hospital encounter of 11/02/15 (from the past 24 hour(s))  Urinalysis, Routine w reflex microscopic (not at [facility])     Status: Abnormal   Collection Time: 11/02/15  1:24 PM  Result Value Ref Range   Color, Urine YELLOW YELLOW   APPearance CLEAR CLEAR   Specific Gravity, Urine 1.020 1.005 - 1.030   pH 5.5 5.0 - 8.0   Glucose, UA NEGATIVE NEGATIVE mg/dL   Hgb urine dipstick NEGATIVE NEGATIVE   Bilirubin Urine SMALL (A) NEGATIVE   Ketones, ur >80 (A) NEGATIVE mg/dL   Protein, ur NEGATIVE NEGATIVE mg/dL   Nitrite NEGATIVE NEGATIVE   Leukocytes, UA MODERATE (A) NEGATIVE  Urine microscopic-add on     Status: Abnormal   Collection Time: 11/02/15  1:24 PM  Result Value Ref Range   Squamous Epithelial / LPF 0-5 (A) NONE SEEN   WBC, UA 0-5 0 - 5 WBC/hpf   RBC / HPF 0-5 0 - 5 RBC/hpf   Bacteria, UA FEW (A) NONE SEEN  CBC     Status: Abnormal   Collection Time: 11/02/15  1:54 PM  Result Value Ref Range   WBC 15.0 (H) 4.0 - 10.5 K/uL   RBC 5.25 (H) 3.87 - 5.11 MIL/uL   Hemoglobin 15.1 (H) 12.0 - 15.0 g/dL   HCT 41.4 36.0 - 46.0 %   MCV 78.9 78.0 - 100.0 fL   MCH 28.8 26.0 - 34.0 pg   MCHC 36.5 (H) 30.0 - 36.0 g/dL   RDW 12.9 11.5 - 15.5 %   Platelets 304 150 - 400 K/uL  Comprehensive metabolic panel     Status: Abnormal   Collection Time: 11/02/15  1:54 PM  Result Value Ref Range   Sodium 138 135 - 145 mmol/L   Potassium 3.2 (L) 3.5 - 5.1 mmol/L   Chloride 104 101 - 111 mmol/L   CO2 24 22 - 32 mmol/L   Glucose, Bld 87 65 - 99 mg/dL   BUN 6 6 - 20 mg/dL   Creatinine, Ser 0.65 0.44 - 1.00 mg/dL   Calcium 9.9 8.9 - 10.3 mg/dL   Total Protein 8.5 (H) 6.5 - 8.1 g/dL   Albumin 4.5 3.5 - 5.0 g/dL   AST 16 15 - 41 U/L   ALT 13 (L) 14 - 54 U/L   Alkaline Phosphatase 74 38 - 126 U/L   Total Bilirubin 0.9 0.3 - 1.2 mg/dL   GFR calc non Af Amer >60 >60 mL/min   GFR calc Af Amer >60 >60 mL/min   Anion gap 10 5 - 15    MDM IV fluids -- LR bolus x 1 liter followed by  MVI in D5LR x 1 liter Phenergan 25 mg IV Pepcid 20 mg IV Robinul 0.1 mg IV Pt able to tolerate crackers & ginger ale K dur 30 meq PO Pt requesting to be discharged home  Assessment and Plan  A: 1. Nausea and vomiting during pregnancy prior to [redacted] weeks gestation   2. Ptyalism   3. Heartburn  during pregnancy in first trimester   4. Hypokalemia     P: Discharge home Prenatal care scheduled in [place] Rx diclegis, colace, & robinul Prior auth for diclegis faxed in [facility] diet emphasized Discussed taking medications on schedule Discussed reasons to return to [place] 11/02/2015, 1:15 PM

## 2015-11-02 NOTE — MAU Note (Signed)
Precious Gilding, RN Registered Nurse Signed  MAU Note 11/02/2015 12:54 PM    Expand All Collapse All   Asked pt to get clean catch urine. Pt states she needs water to urinate. NT informed pt we only needed a small sample to see how dehydrated she is and can not give her oral po at this time since she hasn't been tolerated them. Pt given cup and pt urinated in bed to obtain non clean sample before tech could say something.

## 2015-11-04 ENCOUNTER — Encounter: Payer: Self-pay | Admitting: General Practice

## 2015-11-09 ENCOUNTER — Telehealth: Payer: Self-pay

## 2015-11-09 NOTE — Telephone Encounter (Signed)
Patient left a message stating she is still waiting on her medication to be approved. [facility] has sent a prescription refill request as well. According to the chart a message was sent to my chart for patient to contact the company for more information.  Please advise

## 2015-11-10 NOTE — Telephone Encounter (Signed)
Called patient stating I am returning your phone call. Provided [place] to patient for her to call. Patient verbalized understanding & states someone told her since she couldn't keep pills down she could insert the phenergan into her vagina. Patient states since doing so she has an odor and a thick clumpy d/c. Recommended to patient she go to her pharmacy & try monistat 7 OTC. Patient verbalized understanding & asked how long it would take to get the new nausea medication. Told patient the pharmacy usually moves pretty quickly we have found. Told patient to call us back if she has any issues. Patient verbalized understanding & had no questions

## 2015-11-11 ENCOUNTER — Telehealth: Payer: Self-pay | Admitting: General Practice

## 2015-11-11 DIAGNOSIS — B9689 Other specified bacterial agents as the cause of diseases classified elsewhere: Secondary | ICD-10-CM

## 2015-11-11 DIAGNOSIS — N760 Acute vaginitis: Principal | ICD-10-CM

## 2015-11-11 MED ORDER — METRONIDAZOLE 500 MG PO TABS: 500.0000 mg | ORAL_TABLET | Freq: Two times a day (BID) | ORAL | Status: DC

## 2015-11-11 NOTE — Telephone Encounter (Signed)
Received call from patient that she has one pill of phenergan left & she called the pharmacy but was told they cant help her and she needed to contact us. Called medicaid and pre-auth diclegis. Called patient & informed her. Patient verbalized understanding & states her d/c has increased and is now green & she occasional has sharp pains in her belly. Told patient to not insert any more pills in her vagina & informed her of flagyl Rx for BV. Reviewed medication directions for flagyl & diclegis with patient. Patient verbalized understanding to all & had no questions

## 2015-11-20 ENCOUNTER — Other Ambulatory Visit (HOSPITAL_COMMUNITY)
Admission: RE | Admit: 2015-11-20 | Discharge: 2015-11-20 | Disposition: A | Discharge status: Home / Self Care | Payer: Medicaid Other | Source: Ambulatory Visit | Attending: Family Medicine | Admitting: Family Medicine

## 2015-11-20 ENCOUNTER — Encounter: Payer: Self-pay | Admitting: Family Medicine

## 2015-11-20 ENCOUNTER — Ambulatory Visit (INDEPENDENT_AMBULATORY_CARE_PROVIDER_SITE_OTHER): Payer: Medicaid Other | Admitting: Family Medicine

## 2015-11-20 VITALS — BP 113/72 | HR 113 | Wt 227.4 lb

## 2015-11-20 DIAGNOSIS — Z01419 Encounter for gynecological examination (general) (routine) without abnormal findings: Secondary | ICD-10-CM | POA: Diagnosis present

## 2015-11-20 DIAGNOSIS — Z3401 Encounter for supervision of normal first pregnancy, first trimester: Secondary | ICD-10-CM | POA: Diagnosis not present

## 2015-11-20 DIAGNOSIS — D126 Benign neoplasm of colon, unspecified: Secondary | ICD-10-CM | POA: Diagnosis present

## 2015-11-20 DIAGNOSIS — O210 Mild hyperemesis gravidarum: Secondary | ICD-10-CM

## 2015-11-20 DIAGNOSIS — O0993 Supervision of high risk pregnancy, unspecified, third trimester: Secondary | ICD-10-CM | POA: Insufficient documentation

## 2015-11-20 LAB — POCT URINALYSIS DIP (DEVICE)
Glucose, UA: NEGATIVE mg/dL
HGB URINE DIPSTICK: NEGATIVE
Ketones, ur: >=160 mg/dL — AB
NITRITE: NEGATIVE
PH: 5.5 (ref 5.0–8.0)
Protein, ur: NEGATIVE mg/dL
SPECIFIC GRAVITY, URINE: 1.025 (ref 1.005–1.030)
Urobilinogen, UA: 0.2 mg/dL (ref 0.0–1.0)

## 2015-11-20 NOTE — Progress Notes (Signed)
Pt feels very tired. She has a lot of cramps in her calves. Pts nausea and vomiting are getting better on Diclegis. Needs rx for PNV.  Pt could not do glucose test unable to finish in time.

## 2015-11-20 NOTE — Progress Notes (Signed)
   Subjective:    Melanie Cisneros is a G65P0 [redacted]w[redacted]d being seen today for her first obstetrical visit.  Her obstetrical history is significant for obesity. Patient does intend to breast feed. Pregnancy history fully reviewed.  Patient reports nausea and vomiting.  Filed Vitals:   11/20/15 0925  BP: 113/72  Pulse: 113  Weight: 227 lb 6.4 oz (103.148 kg)    HISTORY: OB History  Gravida Para Term Preterm AB SAB TAB Ectopic Multiple Living  1         0    # Outcome Date GA Lbr Len/2nd Weight Sex Delivery Anes PTL Lv  1 Current              Past Medical History  Diagnosis Date  . Bacterial vaginosis   . Chlamydia   . Gonorrhea   . UTI (lower urinary tract infection)    Past Surgical History  Procedure Laterality Date  . Wisdom tooth extraction     History reviewed. No pertinent family history.   Exam    Uterus:     Pelvic Exam:    Perineum: No Hemorrhoids, Normal Perineum   Vulva: normal, Bartholin's, Urethra, Skene's normal   Vagina:  normal mucosa, normal discharge   Cervix: no cervical motion tenderness, no lesions and nulliparous appearance   Adnexa: normal adnexa and no mass, fullness, tenderness   Bony Pelvis: gynecoid  System:     Skin: normal coloration and turgor, no rashes    Neurologic: gait normal; reflexes normal and symmetric   Extremities: normal strength, tone, and muscle mass   HEENT PERRLA and extra ocular movement intact   Mouth/Teeth mucous membranes moist, pharynx normal without lesions   Neck supple and no masses   Cardiovascular: regular rate and rhythm, no murmurs or gallops   Respiratory:  appears well, vitals normal, no respiratory distress, acyanotic, normal RR, ear and throat exam is normal, neck free of mass or lymphadenopathy, chest clear, no wheezing, crepitations, rhonchi, normal symmetric air entry   Abdomen: soft, non-tender; bowel sounds normal; no masses,  no organomegaly   Urinary: urethral meatus normal      Assessment:     Pregnancy: G1P0 Patient Active Problem List   Diagnosis Date Noted  . Supervision of normal first pregnancy 11/20/2015        Plan:      1. Encounter for supervision of normal first pregnancy in first trimester Quad screen between 14-23 weeks.   [place] - Prenatal Profile - Culture, OB Urine - GC/Chlamydia probe amp ([place])not at [facility] - Pain Mgmt, Profile 6 Conf w/o mM, U - Cytology - PAP - Glucose Tolerance, 1 HR (50g) w/o Fasting  2.  Hyperemesis -  Continue diclygis, robinul.   100% of 30 min visit spent on counseling and coordination of care.    Melanie Cisneros 11/20/2015

## 2015-11-22 LAB — CULTURE, OB URINE
Colony Count: NO GROWTH
Organism ID, Bacteria: NO GROWTH

## 2015-11-23 LAB — PRENATAL PROFILE (SOLSTAS)
Antibody Screen: NEGATIVE
BASOS PCT: 0 %
Basophils Absolute: 0 cells/uL (ref 0–200)
Eosinophils Absolute: 0 cells/uL — ABNORMAL LOW (ref 15–500)
Eosinophils Relative: 0 %
HEMATOCRIT: 36 % (ref 35.0–45.0)
HEMOGLOBIN: 12 g/dL (ref 11.7–15.5)
HEP B S AG: NEGATIVE
HIV: NONREACTIVE
LYMPHS ABS: 1416 {cells}/uL (ref 850–3900)
Lymphocytes Relative: 12 %
MCH: 27.5 pg (ref 27.0–33.0)
MCHC: 33.3 g/dL (ref 32.0–36.0)
MCV: 82.6 fL (ref 80.0–100.0)
MONO ABS: 826 {cells}/uL (ref 200–950)
MPV: 8.8 fL (ref 7.5–12.5)
Monocytes Relative: 7 %
NEUTROS PCT: 81 %
Neutro Abs: 9558 cells/uL — ABNORMAL HIGH (ref 1500–7800)
Platelets: 312 10*3/uL (ref 140–400)
RBC: 4.36 MIL/uL (ref 3.80–5.10)
RDW: 13.9 % (ref 11.0–15.0)
Rh Type: POSITIVE
Rubella: <0.9 Index (ref <0.90)
WBC: 11.8 10*3/uL — AB (ref 3.8–10.8)

## 2015-11-23 LAB — GC/CHLAMYDIA PROBE AMP (~~LOC~~) NOT AT ARMC
CHLAMYDIA, DNA PROBE: NEGATIVE
NEISSERIA GONORRHEA: NEGATIVE

## 2015-11-23 LAB — CYTOLOGY - PAP

## 2015-11-24 LAB — PAIN MGMT, PROFILE 6 CONF W/O MM, U
6 Acetylmorphine: NEGATIVE ng/mL (ref <10)
AMPHETAMINES: NEGATIVE ng/mL (ref <500)
Alcohol Metabolites: NEGATIVE ng/mL (ref <500)
BENZODIAZEPINES: NEGATIVE ng/mL (ref <100)
Barbiturates: NEGATIVE ng/mL (ref <300)
CREATININE: 255 mg/dL (ref >=20.0)
Cocaine Metabolite: NEGATIVE ng/mL (ref <150)
Marijuana Metabolite: 15 ng/mL — ABNORMAL HIGH (ref <5)
Marijuana Metabolite: POSITIVE ng/mL — AB (ref <20)
Methadone Metabolite: NEGATIVE ng/mL (ref <100)
OPIATES: NEGATIVE ng/mL (ref <100)
OXIDANT: NEGATIVE ug/mL (ref <200)
OXYCODONE: NEGATIVE ng/mL (ref <100)
Phencyclidine: NEGATIVE ng/mL (ref <25)
Please note:: 0
pH: 6.32 (ref 4.5–9.0)

## 2015-12-12 ENCOUNTER — Inpatient Hospital Stay (HOSPITAL_COMMUNITY)
Admission: AD | Admit: 2015-12-12 | Discharge: 2015-12-12 | Disposition: A | Discharge status: Home / Self Care | Payer: Medicaid Other | Source: Ambulatory Visit | Attending: Family Medicine | Admitting: Family Medicine

## 2015-12-12 ENCOUNTER — Encounter (HOSPITAL_COMMUNITY): Payer: Self-pay

## 2015-12-12 DIAGNOSIS — O219 Vomiting of pregnancy, unspecified: Secondary | ICD-10-CM | POA: Diagnosis not present

## 2015-12-12 DIAGNOSIS — Z3A14 14 weeks gestation of pregnancy: Secondary | ICD-10-CM | POA: Insufficient documentation

## 2015-12-12 DIAGNOSIS — K117 Disturbances of salivary secretion: Secondary | ICD-10-CM

## 2015-12-12 LAB — URINALYSIS, ROUTINE W REFLEX MICROSCOPIC
BILIRUBIN URINE: NEGATIVE
Glucose, UA: NEGATIVE mg/dL
Hgb urine dipstick: NEGATIVE
NITRITE: NEGATIVE
PH: 6 (ref 5.0–8.0)
Protein, ur: NEGATIVE mg/dL
Specific Gravity, Urine: 1.02 (ref 1.005–1.030)

## 2015-12-12 LAB — URINE MICROSCOPIC-ADD ON: RBC / HPF: NONE SEEN RBC/hpf (ref 0–5)

## 2015-12-12 MED ORDER — ONDANSETRON 8 MG PO TBDP: 8.0000 mg | ORAL_TABLET | Freq: Two times a day (BID) | ORAL | 3 refills | Status: DC

## 2015-12-12 MED ORDER — HYDROXYZINE PAMOATE 50 MG PO CAPS: 50.0000 mg | ORAL_CAPSULE | Freq: Three times a day (TID) | ORAL | 3 refills | Status: DC | PRN

## 2015-12-12 MED ORDER — ONDANSETRON 8 MG PO TBDP
8.0000 mg | ORAL_TABLET | Freq: Two times a day (BID) | ORAL | Status: DC
  Administered 2015-12-12: 8 mg via ORAL
  Filled 2015-12-12: qty 1

## 2015-12-12 MED ORDER — HYDROXYZINE HCL 50 MG/ML IM SOLN
50.0000 mg | Freq: Once | INTRAMUSCULAR | Status: AC
  Administered 2015-12-12: 50 mg via INTRAMUSCULAR
  Filled 2015-12-12: qty 1

## 2015-12-12 NOTE — MAU Note (Signed)
Pt arrived via [facility].  Pt states she has been having some nausea and vomiting her entire pregnancy.  Pt states the medications she has been prescribed are not working.  Pt states she felt like she needed to come in because today she started being unable to keep any water down.  Pt is spitting up in a water bottle.  Pt states she has not eaten in 48 hours.  Pt states she started feeling depressed during her first month and a half of pregnancy when her morning sickness got ready bad.  She states she feels down and out and is crying all the time.  Pt states she does not think about hurting herself she is just real sad.  Pt states that she just doesn't want to be in denial about how she is feeling because she wants to deal with it now before she has the baby and ends up with post-partum depressions.  Pt states she has a lot to be thankful for and her mom is a big part of her support system.  Pt states that even though she has a supportive family she feels like she is all alone.  Pt asked if maybe this can be part of the reason she is not eating.  Pt states that a few weeks ago she bought a coloring book and it allowed her to focus on the book and it helped keep her mind occupied so she could feel a little better.  Pt states she is not working right now because of how she is feeling so she is not used to not being busy.  Pt states when she has to cry she just allows herself to cry it out and she starts to feel better.  Pt is in tearing crying while talking to the nurse.

## 2015-12-12 NOTE — Discharge Instructions (Signed)
Hyperemesis Gravidarum  Hyperemesis gravidarum is a severe form of nausea and vomiting that happens during pregnancy. Hyperemesis is worse than morning sickness. It may cause you to have nausea or vomiting all day for many days. It may keep you from eating and drinking enough food and liquids. Hyperemesis usually occurs during the first half (the first 20 weeks) of pregnancy. It often goes away once a woman is in her second half of pregnancy. However, sometimes hyperemesis continues through an entire pregnancy.   CAUSES   The cause of this condition is not completely known but is thought to be related to changes in the body's hormones when pregnant. It could be from the high level of the pregnancy hormone or an increase in estrogen in the body.   SIGNS AND SYMPTOMS    Severe nausea and vomiting.   Nausea that does not go away.   Vomiting that does not allow you to keep any food down.   Weight loss and body fluid loss (dehydration).   Having no desire to eat or not liking food you have previously enjoyed.  DIAGNOSIS   Your health care provider will do a physical exam and ask you about your symptoms. He or she may also order blood tests and urine tests to make sure something else is not causing the problem.   TREATMENT   You may only need medicine to control the problem. If medicines do not control the nausea and vomiting, you will be treated in the hospital to prevent dehydration, increased acid in the blood (acidosis), weight loss, and changes in the electrolytes in your body that may harm the unborn baby (fetus). You may need IV fluids.   HOME CARE INSTRUCTIONS    Only take over-the-counter or prescription medicines as directed by your health care provider.   Try eating a couple of dry crackers or toast in the morning before getting out of bed.   Avoid foods and smells that upset your stomach.   Avoid fatty and spicy foods.   Eat 5-6 small meals a day.   Do not drink when eating meals. Drink between  meals.   For snacks, eat high-protein foods, such as cheese.   Eat or suck on things that have ginger in them. Ginger helps nausea.   Avoid food preparation. The smell of food can spoil your appetite.   Avoid iron pills and iron in your multivitamins until after 3-4 months of being pregnant. However, consult with your health care provider before stopping any prescribed iron pills.  SEEK MEDICAL CARE IF:    Your abdominal pain increases.   You have a severe headache.   You have vision problems.   You are losing weight.  SEEK IMMEDIATE MEDICAL CARE IF:    You are unable to keep fluids down.   You vomit blood.   You have constant nausea and vomiting.   You have excessive weakness.   You have extreme thirst.   You have dizziness or fainting.   You have a fever or persistent symptoms for more than 2-3 days.   You have a fever and your symptoms suddenly get worse.  MAKE SURE YOU:    Understand these instructions.   Will watch your condition.   Will get help right away if you are not doing well or get worse.     This information is not intended to replace advice given to you by your health care provider. Make sure you discuss any questions you have with 

## 2015-12-12 NOTE — MAU Provider Note (Signed)
History   G1 @ 14.2 wks in with c/o nausea and vomiting x 2 days and persistent spitting that started with the pregancy.  CSN: [id number]  Arrival date & time 12/12/15  [id number]   First Provider Initiated Contact with Patient 12/12/15 1908      Chief Complaint  Patient presents with  . Nausea  . Emesis    HPI  Past Medical History:  Diagnosis Date  . Bacterial vaginosis   . Chlamydia   . Gonorrhea   . UTI (lower urinary tract infection)     Past Surgical History:  Procedure Laterality Date  . WISDOM TOOTH EXTRACTION      No family history on file.  Social History  Substance Use Topics  . Smoking status: Never Smoker  . Smokeless tobacco: Not on file  . Alcohol use No    OB History    Gravida Para Term Preterm AB Living   1         0   SAB TAB Ectopic Multiple Live Births                  Review of Systems  Constitutional: Negative.   HENT: Negative.   Eyes: Negative.   Respiratory: Negative.   Cardiovascular: Negative.   Gastrointestinal: Positive for nausea and vomiting.  Endocrine: Negative.   Genitourinary: Negative.   Musculoskeletal: Negative.   Skin: Negative.   Allergic/Immunologic: Negative.   Neurological: Negative.   Hematological: Negative.   Psychiatric/Behavioral: Negative.     Allergies  Amoxicillin and Penicillins  Home Medications    BP 117/85 (BP Location: Right Arm)   Pulse 87   Temp 98.8 F (37.1 C) (Oral)   Resp 16   LMP 08/23/2015   Physical Exam  Constitutional: She is oriented to person, place, and time. She appears well-developed and well-nourished.  HENT:  Head: Normocephalic.  Neck: Normal range of motion.  Cardiovascular: Normal rate, regular rhythm, normal heart sounds and intact distal pulses.   Pulmonary/Chest: Effort normal and breath sounds normal.  Abdominal: Soft. Bowel sounds are normal.  Musculoskeletal: Normal range of motion.  Neurological: She is alert and oriented to person, place, and time. She  has normal reflexes.  Skin: Skin is warm and dry.  Psychiatric: She has a normal mood and affect. Her behavior is normal. Judgment and thought content normal.    MAU Course  Procedures (including critical care time)  Labs Reviewed  URINALYSIS, [place] (NOT AT [facility])   No results found.   No diagnosis found.  Dx: nausea and vomiting of pregnancy    MDM  FHR st and reg with doppler. Pt toleration po's well and requesting d/c home. Will d/c home

## 2015-12-23 ENCOUNTER — Ambulatory Visit (INDEPENDENT_AMBULATORY_CARE_PROVIDER_SITE_OTHER): Payer: Medicaid Other | Admitting: Obstetrics & Gynecology

## 2015-12-23 VITALS — BP 107/63 | HR 82 | Wt 230.6 lb

## 2015-12-23 DIAGNOSIS — Z3401 Encounter for supervision of normal first pregnancy, first trimester: Secondary | ICD-10-CM

## 2015-12-23 DIAGNOSIS — Z3402 Encounter for supervision of normal first pregnancy, second trimester: Secondary | ICD-10-CM

## 2015-12-23 DIAGNOSIS — Z1389 Encounter for screening for other disorder: Secondary | ICD-10-CM

## 2015-12-23 LAB — POCT URINALYSIS DIP (DEVICE)
Bilirubin Urine: NEGATIVE
GLUCOSE, UA: NEGATIVE mg/dL
HGB URINE DIPSTICK: NEGATIVE
Ketones, ur: NEGATIVE mg/dL
NITRITE: NEGATIVE
Protein, ur: NEGATIVE mg/dL
SPECIFIC GRAVITY, URINE: 1.015 (ref 1.005–1.030)
UROBILINOGEN UA: 0.2 mg/dL (ref 0.0–1.0)
pH: 5.5 (ref 5.0–8.0)

## 2015-12-23 NOTE — Progress Notes (Signed)
Subjective:  Melanie Cisneros is a 25 y.o. S AA  G1P0 at [redacted]w[redacted]d being seen today for ongoing prenatal care.  She is currently monitored for the following issues for this low-risk pregnancy and has Supervision of normal first pregnancy and Hyperemesis affecting pregnancy, antepartum on her problem list.  Patient reports no complaints.  Contractions: Not present. Vag. Bleeding: None.  Movement: Present. Denies leaking of fluid.   The following portions of the patient's history were reviewed and updated as appropriate: allergies, current medications, past family history, past medical history, past social history, past surgical history and problem list. Problem list updated.  Objective:   Vitals:   12/23/15 1244  BP: 107/63  Pulse: 82  Weight: 230 lb 9.6 oz (104.6 kg)    Fetal Status: Fetal Heart Rate (bpm): 142   Movement: Present     General:  Alert, oriented and cooperative. Patient is in no acute distress.  Skin: Skin is warm and dry. No rash noted.   Cardiovascular: Normal heart rate noted  Respiratory: Normal respiratory effort, no problems with respiration noted  Abdomen: Soft, gravid, appropriate for gestational age. Pain/Pressure: Absent     Pelvic:  Cervical exam deferred        Extremities: Normal range of motion.  Edema: None  Mental Status: Normal mood and affect. Normal behavior. Normal judgment and thought content.   Urinalysis:      Assessment and Plan:  Pregnancy: G1P0 at [redacted]w[redacted]d  1. Encounter for routine screening for malformation using ultrasonics  - [place] MFM OB COMP + 14 WK; Future  2. Encounter for supervision of normal first pregnancy in first trimester - Early glucola in 1 week  Preterm labor symptoms and general obstetric precautions including but not limited to vaginal bleeding, contractions, leaking of fluid and fetal movement were reviewed in detail with the patient. Please refer to After Visit Summary for other counseling recommendations.  Return in about  1 week (around 12/30/2015), or for glucola, 4 weeks for provider visit.   Emily Filbert, MD

## 2015-12-24 ENCOUNTER — Encounter: Payer: Self-pay | Admitting: Family Medicine

## 2015-12-29 ENCOUNTER — Other Ambulatory Visit: Payer: Medicaid Other

## 2015-12-31 ENCOUNTER — Encounter: Payer: Self-pay | Admitting: Family Medicine

## 2016-01-11 ENCOUNTER — Encounter (HOSPITAL_COMMUNITY): Payer: Self-pay | Admitting: Obstetrics & Gynecology

## 2016-01-20 ENCOUNTER — Ambulatory Visit (HOSPITAL_COMMUNITY)
Admission: RE | Admit: 2016-01-20 | Discharge: 2016-01-20 | Disposition: A | Discharge status: Home / Self Care | Payer: Medicaid Other | Source: Ambulatory Visit | Attending: Obstetrics & Gynecology | Admitting: Obstetrics & Gynecology

## 2016-01-20 ENCOUNTER — Other Ambulatory Visit: Payer: Self-pay | Admitting: Obstetrics & Gynecology

## 2016-01-20 DIAGNOSIS — Z36 Encounter for antenatal screening of mother: Secondary | ICD-10-CM | POA: Insufficient documentation

## 2016-01-20 DIAGNOSIS — Z3A19 19 weeks gestation of pregnancy: Secondary | ICD-10-CM | POA: Insufficient documentation

## 2016-01-20 DIAGNOSIS — Z1389 Encounter for screening for other disorder: Secondary | ICD-10-CM

## 2016-01-20 NOTE — Addendum Note (Signed)
Encounter addended by: Hilda Lias, RT on: 01/20/2016  3:30 PM<BR>    Actions taken: Imaging Exam ended

## 2016-01-27 ENCOUNTER — Ambulatory Visit (INDEPENDENT_AMBULATORY_CARE_PROVIDER_SITE_OTHER): Payer: Self-pay | Admitting: Obstetrics and Gynecology

## 2016-01-27 VITALS — BP 120/78 | HR 91 | Wt 242.5 lb

## 2016-01-27 DIAGNOSIS — Z283 Underimmunization status: Secondary | ICD-10-CM

## 2016-01-27 DIAGNOSIS — Z3402 Encounter for supervision of normal first pregnancy, second trimester: Secondary | ICD-10-CM

## 2016-01-27 DIAGNOSIS — O9989 Other specified diseases and conditions complicating pregnancy, childbirth and the puerperium: Secondary | ICD-10-CM

## 2016-01-27 DIAGNOSIS — O09899 Supervision of other high risk pregnancies, unspecified trimester: Secondary | ICD-10-CM

## 2016-01-27 DIAGNOSIS — O99322 Drug use complicating pregnancy, second trimester: Secondary | ICD-10-CM | POA: Insufficient documentation

## 2016-01-27 LAB — POCT URINALYSIS DIP (DEVICE)
BILIRUBIN URINE: NEGATIVE
GLUCOSE, UA: NEGATIVE mg/dL
Hgb urine dipstick: NEGATIVE
KETONES UR: NEGATIVE mg/dL
Nitrite: NEGATIVE
PH: 7 (ref 5.0–8.0)
PROTEIN: NEGATIVE mg/dL
SPECIFIC GRAVITY, URINE: 1.02 (ref 1.005–1.030)
Urobilinogen, UA: 0.2 mg/dL (ref 0.0–1.0)

## 2016-01-27 NOTE — Progress Notes (Signed)
Declined Flu 

## 2016-01-27 NOTE — Progress Notes (Signed)
    PRENATAL VISIT NOTE  Subjective:  Melanie Cisneros is a 25 y.o. G1P0 at [redacted]w[redacted]d being seen today for ongoing prenatal care.  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  Patient reports no complaints.  Contractions: Not present. Vag. Bleeding: None.  Movement: Present. Denies leaking of fluid.   Patient concerned about food sources. Says she doesn't have a lot of access to food at home. She would like some resources for local food banks.   The following portions of the patient's history were reviewed and updated as appropriate: allergies, current medications, past family history, past medical history, past social history, past surgical history and problem list. Problem list updated.  Objective:   Vitals:   01/27/16 1036  BP: 120/78  Pulse: 91  Weight: 242 lb 8 oz (110 kg)    Fetal Status: Fetal Heart Rate (bpm): 140   Movement: Present     General:  Alert, oriented and cooperative. Patient is in no acute distress.  Skin: Skin is warm and dry. No rash noted.   Cardiovascular: Normal heart rate noted  Respiratory: Normal respiratory effort, no problems with respiration noted  Abdomen: Soft, gravid, appropriate for gestational age. Pain/Pressure: Absent     Pelvic:  Cervical exam deferred        Extremities: Normal range of motion.  Edema: None  Mental Status: Normal mood and affect. Normal behavior. Normal judgment and thought content.   Urinalysis:      Assessment and Plan:  Pregnancy: G1P0 at [redacted]w[redacted]d  1. 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 Encounter for supervision of normal first pregnancy in second trimester  - 1 hour GTT today  - Quad screen; patient called back and would like  Quad screening. Message sent     to clinical pool for lab draw appointment only. Patient aware that the [facility] will    call her for an appointment.  -  social work to see patient for [employer] bank resources.   Preterm labor symptoms and general obstetric precautions including but not limited to vaginal bleeding, contractions, leaking of fluid and fetal movement were reviewed in detail with the patient. Please refer to After Visit Summary for other counseling recommendations.  Return in about 4 weeks (around 02/24/2016).  Lezlie Lye, NP

## 2016-01-28 LAB — GLUCOSE TOLERANCE, 1 HOUR (50G) W/O FASTING: GLUCOSE, 1 HR, GESTATIONAL: 74 mg/dL (ref <140)

## 2016-02-02 ENCOUNTER — Other Ambulatory Visit: Payer: Medicaid Other

## 2016-02-25 ENCOUNTER — Ambulatory Visit: Payer: Medicaid Other | Admitting: Clinical

## 2016-02-25 ENCOUNTER — Encounter: Payer: Self-pay | Admitting: Family Medicine

## 2016-02-25 ENCOUNTER — Ambulatory Visit (INDEPENDENT_AMBULATORY_CARE_PROVIDER_SITE_OTHER): Payer: Medicaid Other | Admitting: Obstetrics and Gynecology

## 2016-02-25 VITALS — BP 116/78 | HR 89 | Wt 251.6 lb

## 2016-02-25 DIAGNOSIS — Z658 Other specified problems related to psychosocial circumstances: Secondary | ICD-10-CM

## 2016-02-25 DIAGNOSIS — Z3402 Encounter for supervision of normal first pregnancy, second trimester: Secondary | ICD-10-CM

## 2016-02-25 DIAGNOSIS — O210 Mild hyperemesis gravidarum: Secondary | ICD-10-CM

## 2016-02-25 DIAGNOSIS — O9989 Other specified diseases and conditions complicating pregnancy, childbirth and the puerperium: Secondary | ICD-10-CM

## 2016-02-25 DIAGNOSIS — K117 Disturbances of salivary secretion: Secondary | ICD-10-CM | POA: Insufficient documentation

## 2016-02-25 DIAGNOSIS — Z283 Underimmunization status: Secondary | ICD-10-CM

## 2016-02-25 DIAGNOSIS — Z2839 Other underimmunization status: Secondary | ICD-10-CM

## 2016-02-25 NOTE — Progress Notes (Signed)
rou

## 2016-02-25 NOTE — BH Specialist Note (Signed)
Session Start time: 3:30   End Time: 3:40pm Total Time:  1min Type of Service: [place]: No.   Interpreter Name & Language: n/a # [facility] Visits July 2017-June 2018: 1st   SUBJECTIVE: Melanie Cisneros is a 25 y.o. female  Pt. was referred by Arlina Robes, MD for:  psychosocial Pt. reports the following symptoms/concerns: Pt states that she does not need resources for food, she just needs to eat now; transportation sometimes an issue. Duration of problem:  Today Severity: mild Previous treatment: none   OBJECTIVE: Mood: Appropriate & Affect: Appropriate Risk of harm to self or others: no known risk of harm to self or others Assessments administered: none   GOALS ADDRESSED:  -Educate about community resources  INTERVENTIONS: Supportive   ASSESSMENT:  Pt currently experiencing Psychosocial stressors.  Pt may benefit from community resources.   PLAN: 1. F/U with behavioral health clinician: as needed 2. Behavioral Health meds: none 3. Behavioral recommendations:  -Consider Medicaid transportation and/or [place] bus [place] reduced fare transportation 4. Referral: [facility]  Geraldine Contras: .Geraldine Contras

## 2016-02-25 NOTE — Progress Notes (Signed)
Subjective:  Melanie Cisneros is a 25 y.o. G1P0 at [redacted]w[redacted]d being seen today for ongoing prenatal care.  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  Patient reports no complaints.  Contractions: Not present. Vag. Bleeding: None.  Movement: Present. Denies leaking of fluid.   The following portions of the patient's history were reviewed and updated as appropriate: allergies, current medications, past family history, past medical history, past social history, past surgical history and problem list. Problem list updated.  Objective:   Vitals:   02/25/16 1430  BP: 116/78  Pulse: 89  Weight: 251 lb 9.6 oz (114.1 kg)    Fetal Status: Fetal Heart Rate (bpm): 140 Fundal Height: 25 cm Movement: Present     General:  Alert, oriented and cooperative. Patient is in no acute distress.  Skin: Skin is warm and dry. No rash noted.   Cardiovascular: Normal heart rate noted  Respiratory: Normal respiratory effort, no problems with respiration noted  Abdomen: Soft, gravid, appropriate for gestational age. Pain/Pressure: Absent     Pelvic:  Cervical exam deferred        Extremities: Normal range of motion.  Edema: Trace  Mental Status: Normal mood and affect. Normal behavior. Normal judgment and thought content.   Urinalysis:      Assessment and Plan:  Pregnancy: G1P0 at [redacted]w[redacted]d  1. Encounter for supervision of normal first pregnancy in second trimester Decline flu vaccine  2. Hyperemesis affecting pregnancy, antepartum Stable. PRN Phenergan  3. Rubella non-immune status, antepartum Vaccine postpartum  4. Psychosocial stressors SW to see today  5. Ptyalism Stable  Preterm labor symptoms and general obstetric  precautions including but not limited to vaginal bleeding, contractions, leaking of fluid and fetal movement were reviewed in detail with the patient. Please refer to After Visit Summary for other counseling recommendations.  No Follow-up on file.   Chancy Milroy, MD

## 2016-03-17 ENCOUNTER — Encounter: Payer: Self-pay | Admitting: Obstetrics and Gynecology

## 2016-03-30 ENCOUNTER — Ambulatory Visit (INDEPENDENT_AMBULATORY_CARE_PROVIDER_SITE_OTHER): Payer: Medicaid Other | Admitting: Family Medicine

## 2016-03-30 VITALS — BP 129/73 | HR 84 | Wt 267.0 lb

## 2016-03-30 DIAGNOSIS — R12 Heartburn: Secondary | ICD-10-CM

## 2016-03-30 DIAGNOSIS — O26893 Other specified pregnancy related conditions, third trimester: Secondary | ICD-10-CM

## 2016-03-30 DIAGNOSIS — Z3403 Encounter for supervision of normal first pregnancy, third trimester: Secondary | ICD-10-CM

## 2016-03-30 MED ORDER — FAMOTIDINE 20 MG PO TABS: 20.0000 mg | ORAL_TABLET | Freq: Two times a day (BID) | ORAL | 3 refills | Status: DC

## 2016-03-30 NOTE — Progress Notes (Signed)
   PRENATAL VISIT NOTE  Subjective:  Melanie Cisneros is a 25 y.o. G1P0 at [redacted]w[redacted]d being seen today for ongoing prenatal care.  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  Patient reports heartburn.  Contractions: Not present. Vag. Bleeding: None.  Movement: Present. Denies leaking of fluid.   The following portions of the patient's history were reviewed and updated as appropriate: allergies, current medications, past family history, past medical history, past social history, past surgical history and problem list. Problem list updated.  Objective:   Vitals:   03/30/16 1528  BP: 129/73  Pulse: 84  Weight: 267 lb (121.1 kg)    Fetal Status: Fetal Heart Rate (bpm): 142   Movement: Present     General:  Alert, oriented and cooperative. Patient is in no acute distress.  Skin: Skin is warm and dry. No rash noted.   Cardiovascular: Normal heart rate noted  Respiratory: Normal respiratory effort, no problems with respiration noted  Abdomen: Soft, gravid, appropriate for gestational age. Pain/Pressure: Absent     Pelvic:  Cervical exam deferred        Extremities: Normal range of motion.  Edema: Trace  Mental Status: Normal mood and affect. Normal behavior. Normal judgment and thought content.   Assessment and Plan:  Pregnancy: G1P0 at [redacted]w[redacted]d  1. Encounter for supervision of normal first pregnancy in third trimester FHT and FH normal. Repeat US to complete fetal survey. Had moved to [place] and was there for a few weeks. Had GTT there. Will request records.   2. Heartburn in pregnancy in third trimester Pepcid prescribed.  Preterm labor symptoms and general obstetric precautions including but not limited to vaginal bleeding, contractions, leaking  of fluid and fetal movement were reviewed in detail with the patient. Please refer to After Visit Summary for other counseling recommendations.  Return in about 2 weeks (around 04/13/2016) for OB f/u.   Truett Mainland, DO

## 2016-03-30 NOTE — Progress Notes (Signed)
Pt had glucose test in [place] on 03/16/16 per patient was normal.

## 2016-04-04 ENCOUNTER — Ambulatory Visit (HOSPITAL_COMMUNITY)
Admission: RE | Admit: 2016-04-04 | Discharge: 2016-04-04 | Disposition: A | Discharge status: Home / Self Care | Payer: Medicaid Other | Source: Ambulatory Visit | Attending: Family Medicine | Admitting: Family Medicine

## 2016-04-04 ENCOUNTER — Other Ambulatory Visit: Payer: Self-pay | Admitting: Family Medicine

## 2016-04-04 DIAGNOSIS — Z362 Encounter for other antenatal screening follow-up: Secondary | ICD-10-CM

## 2016-04-04 DIAGNOSIS — Z363 Encounter for antenatal screening for malformations: Secondary | ICD-10-CM | POA: Diagnosis not present

## 2016-04-04 DIAGNOSIS — Z3A20 20 weeks gestation of pregnancy: Secondary | ICD-10-CM | POA: Diagnosis not present

## 2016-04-04 DIAGNOSIS — Z3403 Encounter for supervision of normal first pregnancy, third trimester: Secondary | ICD-10-CM

## 2016-04-04 DIAGNOSIS — Z3A30 30 weeks gestation of pregnancy: Secondary | ICD-10-CM

## 2016-04-13 ENCOUNTER — Other Ambulatory Visit: Payer: Self-pay | Admitting: Obstetrics and Gynecology

## 2016-04-14 ENCOUNTER — Ambulatory Visit (INDEPENDENT_AMBULATORY_CARE_PROVIDER_SITE_OTHER): Payer: Medicaid Other | Admitting: Advanced Practice Midwife

## 2016-04-14 VITALS — BP 130/69 | HR 108 | Wt 266.4 lb

## 2016-04-14 DIAGNOSIS — Z3403 Encounter for supervision of normal first pregnancy, third trimester: Secondary | ICD-10-CM

## 2016-04-14 DIAGNOSIS — O210 Mild hyperemesis gravidarum: Secondary | ICD-10-CM

## 2016-04-14 MED ORDER — ONDANSETRON 8 MG PO TBDP: 8.0000 mg | ORAL_TABLET | Freq: Three times a day (TID) | ORAL | 3 refills | Status: DC | PRN

## 2016-04-14 MED ORDER — HYDROXYZINE PAMOATE 50 MG PO CAPS: 50.0000 mg | ORAL_CAPSULE | Freq: Three times a day (TID) | ORAL | 3 refills | Status: DC | PRN

## 2016-04-14 MED ORDER — DOCUSATE SODIUM 100 MG PO CAPS: 100.0000 mg | ORAL_CAPSULE | Freq: Two times a day (BID) | ORAL | 2 refills | Status: DC | PRN

## 2016-04-14 NOTE — Patient Instructions (Addendum)
For Childbirth Classes: Childbirth Education at [phone] or [phone]  All classes at [facility]: [email or web address]  Third Trimester of Pregnancy The third trimester is from week 29 through week 40 (months 7 through 9). The third trimester is a time when the unborn baby (fetus) is growing rapidly. At the end of the ninth month, the fetus is about 20 inches in length and weighs 6-10 pounds. Body changes during your third trimester Your body goes through many changes during pregnancy. The changes vary from woman to woman. During the third trimester:  Your weight will continue to increase. You can expect to gain 25-35 pounds (11-16 kg) by the end of the pregnancy.  You may begin to get stretch marks on your hips, abdomen, and breasts.  You may urinate more often because the fetus is moving lower into your pelvis and pressing on your bladder.  You may develop or continue to have heartburn. This is caused by increased hormones that slow down muscles in the digestive tract.  You may develop or continue to have constipation because increased hormones slow digestion and cause the muscles that push waste through your intestines to relax.  You may develop hemorrhoids. These are swollen veins (varicose veins) in the rectum that can itch or be painful.  You may develop swollen, bulging veins (varicose veins) in your legs.  You may have increased body aches in the pelvis, back, or thighs. This is due to weight gain and increased hormones that are relaxing your joints.  You may have changes in your hair. These can include thickening of your hair, rapid growth, and changes in texture. Some women also have hair loss during or after pregnancy, or hair that feels dry or thin. Your hair will most likely return to normal after your baby is born.  Your breasts will continue to grow and they will continue to become tender.  A yellow fluid (colostrum) may leak from your breasts. This is the first milk you are producing for your baby.  Your belly button may stick out.  You may notice more swelling in your hands, face, or ankles.  You may have increased tingling or numbness in your hands, arms, and legs. The skin on your belly may also feel numb.  You may feel short of breath because of your expanding uterus.  You may have more problems sleeping. This can be caused by the size of your belly, increased need to urinate, and an increase in your body's metabolism.  You may notice the fetus "dropping," or moving lower in your abdomen.  You may have increased vaginal discharge.  Your cervix becomes thin and soft (effaced) near your due date. What to expect at prenatal visits You will have prenatal exams every 2 weeks until week 36. Then you will have weekly prenatal exams. During a routine prenatal visit:  You will be weighed to make sure you and the fetus are growing normally.  Your blood pressure will be taken.  Your abdomen will be measured to track your baby's growth.  The fetal heartbeat will be listened to.  Any test results from the previous visit will be discussed.  You may have a cervical check near your due date to see if you have effaced. At around 36 weeks, your health care provider will check your cervix. At the same time, your health care provider will also perform a test on the secretions of the vaginal tissue. This test is to determine if a type of bacteria, Group B streptococcus,  is present. Your health care provider will explain this further. Your health care provider may ask you:  What your birth plan is.  How you are feeling.  If you are feeling the baby move.  If you have had any abnormal symptoms, such as leaking fluid, bleeding, severe headaches, or abdominal cramping.  If you are using any tobacco products, including cigarettes, chewing tobacco, and electronic cigarettes.  If  you have any questions. Other tests or screenings that may be performed during your third trimester include:  Blood tests that check for low iron levels (anemia).  Fetal testing to check the health, activity level, and growth of the fetus. Testing is done if you have certain medical conditions or if there are problems during the pregnancy.  Nonstress test (NST). This test checks the health of your baby to make sure there are no signs of problems, such as the baby not getting enough oxygen. During this test, a belt is placed around your belly. The baby is made to move, and its heart rate is monitored during movement. What is false labor? False labor is a condition in which you feel small, irregular tightenings of the muscles in the womb (contractions) that eventually go away. These are called Braxton Hicks contractions. Contractions may last for hours, days, or even weeks before true labor sets in. If contractions come at regular intervals, become more frequent, increase in intensity, or become painful, you should see your health care provider. What are the signs of labor?  Abdominal cramps.  Regular contractions that start at 10 minutes apart and become stronger and more frequent with time.  Contractions that start on the top of the uterus and spread down to the lower abdomen and back.  Increased pelvic pressure and dull back pain.  A watery or bloody mucus discharge that comes from the vagina.  Leaking of amniotic fluid. This is also known as your "water breaking." It could be a slow trickle or a gush. Let your doctor know if it has a color or strange odor. If you have any of these signs, call your health care provider right away, even if it is before your due date. Follow these instructions at home: Eating and drinking  Continue to eat regular, healthy meals.  Do not eat:  Raw meat or meat spreads.  Unpasteurized milk or cheese.  Unpasteurized juice.  Store-made  salad.  Refrigerated smoked seafood.  Hot dogs or deli meat, unless they are piping hot.  More than 6 ounces of albacore tuna a week.  Shark, swordfish, king mackerel, or tile fish.  Store-made salads.  Raw sprouts, such as mung bean or alfalfa sprouts.  Take prenatal vitamins as told by your health care provider.  Take 1000 mg of calcium daily as told by your health care provider.  If you develop constipation:  Take over-the-counter or prescription medicines.  Drink enough fluid to keep your urine clear or pale yellow.  Eat foods that are high in fiber, such as fresh fruits and vegetables, whole grains, and beans.  Limit foods that are high in fat and processed sugars, such as fried and sweet foods. Activity  Exercise only as directed by your health care provider. Healthy pregnant women should aim for 2 hours and 30 minutes of moderate exercise per week. If you experience any pain or discomfort while exercising, stop.  Avoid heavy lifting.  Do not exercise in extreme heat or humidity, or at high altitudes.  Wear low-heel, comfortable shoes.  Practice  good posture.  Do not travel far distances unless it is absolutely necessary and only with the approval of your health care provider.  Wear your seat belt at all times while in a car, on a bus, or on a plane.  Take frequent breaks and rest with your legs elevated if you have leg cramps or low back pain.  Do not use hot tubs, steam rooms, or saunas.  You may continue to have sex unless your health care provider tells you otherwise. Lifestyle  Do not use any products that contain nicotine or tobacco, such as cigarettes and e-cigarettes. If you need help quitting, ask your health care provider.  Do not drink alcohol.  Do not use any medicinal herbs or unprescribed drugs. These chemicals affect the formation and growth of the baby.  If you develop varicose veins:  Wear support pantyhose or compression stockings as  told by your healthcare provider.  Elevate your feet for 15 minutes, 3-4 times a day.  Wear a supportive maternity bra to help with breast tenderness. General instructions  Take over-the-counter and prescription medicines only as told by your health care provider. There are medicines that are either safe or unsafe to take during pregnancy.  Take warm sitz baths to soothe any pain or discomfort caused by hemorrhoids. Use hemorrhoid cream or witch hazel if your health care provider approves.  Avoid cat litter boxes and soil used by cats. These carry germs that can cause birth defects in the baby. If you have a cat, ask someone to clean the litter box for you.  To prepare for the arrival of your baby:  Take prenatal classes to understand, practice, and ask questions about the labor and delivery.  Make a trial run to the hospital.  Visit the hospital and tour the maternity area.  Arrange for maternity or paternity leave through employers.  Arrange for family and friends to take care of pets while you are in the hospital.  Purchase a rear-facing car seat and make sure you know how to install it in your car.  Pack your hospital bag.  Prepare the baby's nursery. Make sure to remove all pillows and stuffed animals from the baby's crib to prevent suffocation.  Visit your dentist if you have not gone during your pregnancy. Use a soft toothbrush to brush your teeth and be gentle when you floss.  Keep all prenatal follow-up visits as told by your health care provider. This is important. Contact a health care provider if:  You are unsure if you are in labor or if your water has broken.  You become dizzy.  You have mild pelvic cramps, pelvic pressure, or nagging pain in your abdominal area.  You have lower back pain.  You have persistent nausea, vomiting, or diarrhea.  You have an unusual or bad smelling vaginal discharge.  You have pain when you urinate. Get help right away  if:  You have a fever.  You are leaking fluid from your vagina.  You have spotting or bleeding from your vagina.  You have severe abdominal pain or cramping.  You have rapid weight loss or weight gain.  You have shortness of breath with chest pain.  You notice sudden or extreme swelling of your face, hands, ankles, feet, or legs.  Your baby makes fewer than 10 movements in 2 hours.  You have severe headaches that do not go away with medicine.  You have vision changes. Summary  The third trimester is from week 29  through week 40, months 7 through 9. The third trimester is a time when the unborn baby (fetus) is growing rapidly.  During the third trimester, your discomfort may increase as you and your baby continue to gain weight. You may have abdominal, leg, and back pain, sleeping problems, and an increased need to urinate.  During the third trimester your breasts will keep growing and they will continue to become tender. A yellow fluid (colostrum) may leak from your breasts. This is the first milk you are producing for your baby.  False labor is a condition in which you feel small, irregular tightenings of the muscles in the womb (contractions) that eventually go away. These are called Braxton Hicks contractions. Contractions may last for hours, days, or even weeks before true labor sets in.  Signs of labor can include: abdominal cramps; regular contractions that start at 10 minutes apart and become stronger and more frequent with time; watery or bloody mucus discharge that comes from the vagina; increased pelvic pressure and dull back pain; and leaking of amniotic fluid. This information is not intended to replace advice given to you by your health care provider. Make sure you discuss any questions you have with your health care provider.  Eating Plan for Hyperemesis Gravidarum Hyperemesis gravidarum is a severe form of morning sickness. Because this condition causes severe nausea  and vomiting, it can lead to dehydration, malnutrition, and weight loss. One way to lessen the symptoms of nausea and vomiting is to follow the eating plan for hyperemesis gravidarum. It is often used along with prescribed medicines to control your symptoms. What can I do to relieve my symptoms? Listen to your body. Everyone is different and has different preferences. Find what works best for you. Take any of the following actions that are helpful to you:  Eat and drink slowly.  Eat 5-6 small meals daily instead of 3 large meals.  Eat crackers before you get out of bed in the morning.  Try having a snack in the middle of the night.  Starchy foods are usually tolerated well. Examples include cereal, toast, bread, potatoes, pasta, rice, and pretzels.  Ginger may help with nausea. Add  tsp ground ginger to hot tea or choose ginger tea.  Try drinking 100% fruit juice or an electrolyte drink. An electrolyte drink contains sodium, potassium, and chloride.  Continue to take your prenatal vitamins as told by your health care provider. If you are having trouble taking your prenatal vitamins, talk with your health care provider about different options.  Include at least 1 serving of protein with your meals and snacks. Protein options include meats or poultry, beans, nuts, eggs, and yogurt. Try eating a protein-rich snack before bed. Examples of these snacks include cheese and crackers or half of a peanut butter or [place] sandwich.  Consider eliminating foods that trigger your symptoms. These may include spicy foods, coffee, high-fat foods, very sweet foods, and acidic foods.  Try meals that have more protein combined with bland, salty, lower-fat, and dry foods, such as nuts, seeds, pretzels, crackers, and cereal.  Talk with your healthcare provider about starting a supplement of vitamin B6.  Have fluids that are cold, clear, and carbonated or sour. Examples include lemonade, ginger ale, lemon-lime  soda, ice water, and sparkling water.  Try lemon or mint tea.  Try brushing your teeth or using a mouth rinse after meals. What should I avoid to reduce my symptoms? Avoiding some of the following things may help reduce  your symptoms.  Foods with strong smells. Try eating meals in well-ventilated areas that are free of odors.  Drinking water or other beverages with meals. Try not to drink anything during the 30 minutes before and after your meals.  Drinking more than 1 cup of fluid at a time. Sometimes using a straw helps.  Fried or high-fat foods, such as butter and cream sauces.  Spicy foods.  Skipping meals as best as you can. Nausea can be more intense on an empty stomach. If you cannot tolerate food at that time, do not force it. Try sucking on ice chips or other frozen items, and make up for missed calories later.  Lying down within 2 hours after eating.  Environmental triggers. These may include smoky rooms, closed spaces, rooms with strong smells, warm or humid places, overly loud and noisy rooms, and rooms with motion or flickering lights.  Quick and sudden changes in your movement. This information is not intended to replace advice given to you by your health care provider. Make sure you discuss any questions you have with your health care provider. Document Released: 02/27/2007 Document Revised: 12/30/2015 Document Reviewed: 12/01/2015 Elsevier Interactive Patient Education  2017 [employer] Released: 04/26/2001 Document Revised: 10/08/2015 Document Reviewed: 07/03/2012 Elsevier Interactive Patient Education  2017 Else[employer] Inc.   High-Fiber Diet Fiber, also called dietary fiber, is a type of carbohydrate found in fruits, vegetables, whole grains, and beans. A high-fiber diet can have many health benefits. Your health care provider may recommend a high-fiber diet to help:  Prevent constipation. Fiber can make your bowel movements more regular.  Lower your  cholesterol.  Relieve hemorrhoids, uncomplicated diverticulosis, or irritable bowel syndrome.  Prevent overeating as part of a weight-loss plan.  Prevent heart disease, type 2 diabetes, and certain cancers. What is my plan? The recommended daily intake of fiber includes:  38 grams for men under age 34.  48 grams for men over age 38.  61 grams for women under age 53.  56 grams for women over age 80. You can get the recommended daily intake of dietary fiber by eating a variety of fruits, vegetables, grains, and beans. Your health care provider may also recommend a fiber supplement if it is not possible to get enough fiber through your diet. What do I need to know about a high-fiber diet?  Fiber supplements have not been widely studied for their effectiveness, so it is better to get fiber through food sources.  Always check the fiber content on thenutrition facts label of any prepackaged food. Look for foods that contain at least 5 grams of fiber per serving.  Ask your dietitian if you have questions about specific foods that are related to your condition, especially if those foods are not listed in the following section.  Increase your daily fiber consumption gradually. Increasing your intake of dietary fiber too quickly may cause bloating, cramping, or gas.  Drink plenty of water. Water helps you to digest fiber. What foods can I eat? Grains  Whole-grain breads. Multigrain cereal. Oats and oatmeal. Brown rice. Barley. Bulgur wheat. [place]. Bran muffins. Popcorn. Rye wafer crackers. Vegetables  Sweet potatoes. Spinach. Kale. Artichokes. Cabbage. Broccoli. Green peas. Carrots. Squash. Fruits  Berries. Pears. Apples. Oranges. Avocados. Prunes and raisins. Dried figs. Meats and Other Protein Sources  [place], kidney, pinto, and soy beans. Split peas. Lentils. Nuts and seeds. Dairy  Fiber-fortified yogurt. Beverages  Fiber-fortified soy milk. Fiber-fortified orange juice. Other    Fiber bars.  The items listed above may not be a complete list of recommended foods or beverages. Contact your dietitian for more options.  What foods are not recommended? Grains  White bread. Pasta made with refined flour. White rice. Vegetables  Fried potatoes. Canned vegetables. Well-cooked vegetables. Fruits  Fruit juice. Cooked, strained fruit. Meats and Other Protein Sources  Fatty cuts of meat. Fried Sales executive or fried fish. Dairy  Milk. Yogurt. Cream cheese. Sour cream. Beverages  Soft drinks. Other  Cakes and pastries. Butter and oils. The items listed above may not be a complete list of foods and beverages to avoid. Contact your dietitian for more information.  What are some tips for including high-fiber foods in my diet?  Eat a wide variety of high-fiber foods.  Make sure that half of all grains consumed each day are whole grains.  Replace breads and cereals made from refined flour or white flour with whole-grain breads and cereals.  Replace white rice with brown rice, bulgur wheat, or millet.  Start the day with a breakfast that is high in fiber, such as a cereal that contains at least 5 grams of fiber per serving.  Use beans in place of meat in soups, salads, or pasta.  Eat high-fiber snacks, such as berries, raw vegetables, nuts, or popcorn. This information is not intended to replace advice given to you by your health care provider. Make sure you discuss any questions you have with your health care provider. Document Released: 05/02/2005 Document Revised: 10/08/2015 Document Reviewed: 10/15/2013 Elsevier Interactive Patient Education  2017 [employer].

## 2016-04-14 NOTE — Progress Notes (Signed)
   PRENATAL VISIT NOTE  Subjective:  Melanie Cisneros is a 25 y.o. G1P0 at [redacted]w[redacted]d being seen today for ongoing prenatal care.  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  Patient reports nausea, vomiting, and spitting, improved with medications.  Contractions: Not present. Vag. Bleeding: None.  Movement: Present. Denies leaking of fluid.   The following portions of the patient's history were reviewed and updated as appropriate: allergies, current medications, past family history, past medical history, past social history, past surgical history and problem list. Problem list updated.  Objective:   Vitals:   04/14/16 1554  BP: 130/69  Pulse: (!) 108  Weight: 266 lb 6.4 oz (120.8 kg)    Fetal Status: Fetal Heart Rate (bpm): 150 Fundal Height: 34 cm Movement: Present     General:  Alert, oriented and cooperative. Patient is in no acute distress.  Skin: Skin is warm and dry. No rash noted.   Cardiovascular: Normal heart rate noted  Respiratory: Normal respiratory effort, no problems with respiration noted  Abdomen: Soft, gravid, appropriate for gestational age. Pain/Pressure: Present     Pelvic:  Cervical exam deferred        Extremities: Normal range of motion.  Edema: None  Mental Status: Normal mood and affect. Normal behavior. Normal judgment and thought content.   Assessment and Plan:  Pregnancy: G1P0 at [redacted]w[redacted]d  1. Encounter for supervision of normal first pregnancy in third trimester --Anticipatory guidance about next visits and next weeks of pregnancy given. --Info given on signing up for childbirth classes/breastfeeding classes.  2. Hyperemesis affecting pregnancy, antepartum --Renewed Rx for Vistaril and Zofran. Pt tried Robinul in past  and did not help spitting.  Preterm labor symptoms and general obstetric precautions including but not limited to vaginal bleeding, contractions, leaking of fluid and fetal movement were reviewed in detail with the patient. Please refer to After Visit Summary for other counseling recommendations.  Return in about 2 weeks (around 04/28/2016).   Elvera Maria, CNM

## 2016-04-14 NOTE — Progress Notes (Signed)
Pt wants refills on hydroxizine, and zofran

## 2016-04-20 ENCOUNTER — Inpatient Hospital Stay (HOSPITAL_COMMUNITY)
Admission: AD | Admit: 2016-04-20 | Discharge: 2016-04-20 | Disposition: A | Discharge status: Home / Self Care | Payer: Medicaid Other | Source: Ambulatory Visit | Attending: Family Medicine | Admitting: Family Medicine

## 2016-04-20 ENCOUNTER — Encounter (HOSPITAL_COMMUNITY): Payer: Self-pay | Admitting: *Deleted

## 2016-04-20 DIAGNOSIS — Z880 Allergy status to penicillin: Secondary | ICD-10-CM | POA: Diagnosis not present

## 2016-04-20 DIAGNOSIS — R0981 Nasal congestion: Secondary | ICD-10-CM | POA: Insufficient documentation

## 2016-04-20 DIAGNOSIS — J029 Acute pharyngitis, unspecified: Secondary | ICD-10-CM | POA: Insufficient documentation

## 2016-04-20 DIAGNOSIS — Z3A32 32 weeks gestation of pregnancy: Secondary | ICD-10-CM | POA: Insufficient documentation

## 2016-04-20 DIAGNOSIS — J069 Acute upper respiratory infection, unspecified: Secondary | ICD-10-CM | POA: Diagnosis not present

## 2016-04-20 DIAGNOSIS — O26893 Other specified pregnancy related conditions, third trimester: Secondary | ICD-10-CM | POA: Diagnosis not present

## 2016-04-20 HISTORY — DX: Obesity, unspecified: E66.9

## 2016-04-20 LAB — URINALYSIS, ROUTINE W REFLEX MICROSCOPIC
Bilirubin Urine: NEGATIVE
GLUCOSE, UA: NEGATIVE mg/dL
HGB URINE DIPSTICK: NEGATIVE
Ketones, ur: NEGATIVE mg/dL
NITRITE: NEGATIVE
PH: 8 (ref 5.0–8.0)
Protein, ur: NEGATIVE mg/dL
SPECIFIC GRAVITY, URINE: 1.011 (ref 1.005–1.030)

## 2016-04-20 NOTE — Discharge Instructions (Signed)
SAFE MEDICATIONS IN PREGNANCY  Acne:  Benzoyl Peroxide  Salicylic Acid   Backache/Headache:  Tylenol: 2 regular strength every 4 hours OR        2 Extra strength every 6 hours   Colds/Coughs/Allergies:  Benadryl (alcohol free) 25 mg every 6 hours as needed  Breath right strips  Claritin  Cepacol throat lozenges  Chloraseptic throat spray  Cold-Eeze- up to three times per day  Cough drops, alcohol free  Flonase (by prescription only)  Guaifenesin  Mucinex  Robitussin DM (plain only, alcohol free)  Saline nasal spray/drops  Sudafed (pseudoephedrine) & Actifed * use only after [redacted] weeks gestation and if you do not have high blood pressure  Tylenol  Vicks Vaporub  Zinc lozenges  Zyrtec   Constipation:  Colace  Ducolax suppositories  Fleet enema  Glycerin suppositories  Metamucil  Milk of magnesia  Miralax  Senokot  Smooth move tea   Diarrhea:  Kaopectate  Imodium A-D   *NO pepto Bismol   Hemorrhoids:  Anusol  Anusol HC  Preparation H  Tucks   Indigestion:  Tums  Maalox  Mylanta  Zantac  Pepcid   Insomnia:  Benadryl (alcohol free) 25mg  every 6 hours as needed  Tylenol PM  Unisom, no Gelcaps   Leg Cramps:  Tums  MagGel   Nausea/Vomiting:  Bonine  Dramamine  Emetrol  Ginger extract  Sea bands  Meclizine  Nausea medication to take during pregnancy:  Unisom (doxylamine succinate 25 mg tablets) Take one tablet daily at bedtime. If symptoms are not adequately controlled, the dose can be increased to a maximum recommended dose of two tablets daily (1/2 tablet in the morning, 1/2 tablet mid-afternoon and one at bedtime).  Vitamin B6 100mg  tablets. Take one tablet twice a day (up to 200 mg per day).   Skin Rashes:  Aveeno products  Benadryl cream or 25mg  every 6 hours as needed  Calamine Lotion  1% cortisone cream   Yeast infection:  Gyne-lotrimin 7  Monistat 7    **If taking multiple medications, please check labels to avoid  duplicating the same active ingredients  **take medication as directed on the label  ** Do not exceed 4000 mg of tylenol in 24 hours  **Do not take medications that contain aspirin or ibuprofen          Upper Respiratory Infection, Adult Most upper respiratory infections (URIs) are caused by a virus. A URI affects the nose, throat, and upper air passages. The most common type of URI is often called "the common cold." Follow these instructions at home:  Take medicines only as told by your doctor.  Gargle warm saltwater or take cough drops to comfort your throat as told by your doctor.  Use a warm mist humidifier or inhale steam from a shower to increase air moisture. This may make it easier to breathe.  Drink enough fluid to keep your pee (urine) clear or pale yellow.  Eat soups and other clear broths.  Have a healthy diet.  Rest as needed.  Go back to work when your fever is gone or your doctor says it is okay.  You may need to stay home longer to avoid giving your URI to others.  You can also wear a face mask and wash your hands often to prevent spread of the virus.  Use your inhaler more if you have asthma.  Do not use any tobacco products, including cigarettes, chewing tobacco, or electronic cigarettes. If you need help quitting, ask  your doctor. Contact a doctor if:  You are getting worse, not better.  Your symptoms are not helped by medicine.  You have chills.  You are getting more short of breath.  You have brown or red mucus.  You have yellow or brown discharge from your nose.  You have pain in your face, especially when you bend forward.  You have a fever.  You have puffy (swollen) neck glands.  You have pain while swallowing.  You have white areas in the back of your throat. Get help right away if:  You have very bad or constant:  Headache.  Ear pain.  Pain in your forehead, behind your eyes, and over your cheekbones (sinus pain).  Chest  pain.  You have long-lasting (chronic) lung disease and any of the following:  Wheezing.  Long-lasting cough.  Coughing up blood.  A change in your usual mucus.  You have a stiff neck.  You have changes in your:  Vision.  Hearing.  Thinking.  Mood. This information is not intended to replace advice given to you by your health care provider. Make sure you discuss any questions you have with your health care provider. Document Released: 10/19/2007 Document Revised: 01/03/2016 Document Reviewed: 08/07/2013 Elsevier Interactive Patient Education  2017 [employer].

## 2016-04-20 NOTE — MAU Provider Note (Signed)
History    CSN: [id number]  Arrival date and time: 04/20/16 [id number]   First Provider Initiated Contact with Patient 04/20/16 1028      Chief Complaint  Patient presents with  . "Not feeling well"    Patient is a 25 year old G1PO at 32+6 who presents with malaise, sore throat, and nasal congestion starting last night. No fevers. Denies cough or SOB. Denies ear pain. Nasal congestion has some yellowish color to it. She sought care because it is her first pregnancy and she was concerned out her illness affecting baby. She denies contractions, vaginal bleeding, and loss of fluid.     OB History    Gravida Para Term Preterm AB Living   1         0   SAB TAB Ectopic Multiple Live Births                  Past Medical History:  Diagnosis Date  . Bacterial vaginosis   . Chlamydia   . Gonorrhea   . Obesity   . UTI (lower urinary tract infection)     Past Surgical History:  Procedure Laterality Date  . WISDOM TOOTH EXTRACTION      Family History  Problem Relation Age of Onset  . Hypertension Mother     Social History  Substance Use Topics  . Smoking status: Never Smoker  . Smokeless tobacco: Never Used  . Alcohol use No    Allergies:  Allergies  Allergen Reactions  . Amoxicillin Hives    Has patient had a PCN reaction causing immediate rash, facial/tongue/throat swelling, SOB or lightheadedness with hypotension: Yes Has patient had a PCN reaction causing severe rash involving mucus membranes or skin necrosis: Yes Has patient had a PCN reaction that required hospitalization Yes Has patient had a PCN reaction occurring within the last 10 years: No If all of the above answers are "NO", then may proceed with Cephalosporin use.  Marland Kitchen Penicillins Hives    Has patient had a PCN reaction causing immediate rash, facial/tongue/throat swelling, SOB or lightheadedness with hypotension: yes Has patient had a PCN reaction causing severe rash involving mucus membranes or skin necrosis:  Yes Has patient had a PCN reaction that required hospitalization Yes Has patient had a PCN reaction occurring within the last 10 years: No If all of the above answers are "NO", then may proceed with Cephalosporin use.     Prescriptions Prior to Admission  Medication Sig Dispense Refill Last Dose  . docusate sodium (COLACE) 100 MG capsule Take 1 capsule (100 mg total) by mouth 2 (two) times daily as needed for mild constipation. 60 capsule 2   . famotidine (PEPCID) 20 MG tablet Take 1 tablet (20 mg total) by mouth 2 (two) times daily. (Patient not taking: Reported on 04/14/2016) 60 tablet 3 Not Taking  . hydrOXYzine (VISTARIL) 50 MG capsule Take 1 capsule (50 mg total) by mouth 3 (three) times daily as needed. 90 capsule 3   . ondansetron (ZOFRAN-ODT) 8 MG disintegrating tablet Take 1 tablet (8 mg total) by mouth every 8 (eight) hours as needed for nausea or vomiting. 40 tablet 3   . Prenatal Vit-Fe Fumarate-FA (PRENATAL MULTIVITAMIN) TABS tablet Take 1 tablet by mouth daily at 12 noon.   Taking    Review of Systems  Constitutional: Positive for malaise/fatigue. Negative for chills and fever.  HENT: Positive for congestion and sore throat. Negative for ear discharge and ear pain.   Eyes: Positive for redness.  Respiratory: Negative for cough and sputum production.   Gastrointestinal: Negative for nausea and vomiting.  Genitourinary: Positive for frequency. Negative for dysuria.   Physical Exam   Blood pressure 105/76, pulse 110, temperature 98.2 F (36.8 C), temperature source Oral, resp. rate 18, height 5\' 5"  (1.651 m), weight 269 lb (122 kg), last menstrual period 08/23/2015.  Physical Exam  Constitutional: No distress.  HENT:  Head: Normocephalic and atraumatic.  Right Ear: Tympanic membrane, external ear and ear canal normal.  Left Ear: Tympanic membrane, external ear and ear canal normal.  Mouth/Throat: Oropharynx is clear and moist. No oropharyngeal exudate.  Eyes: Right eye  exhibits no discharge. Left eye exhibits no discharge. No scleral icterus.  Neck: No thyromegaly present.  Cardiovascular: Normal rate, regular rhythm and normal heart sounds.   No murmur heard. Respiratory: Effort normal and breath sounds normal. No respiratory distress. She has no wheezes.  GI: She exhibits no distension. There is no tenderness. There is no guarding.  Lymphadenopathy:    She has no cervical adenopathy.  Skin: Skin is warm and dry. No rash noted. No erythema.    MAU Course  Procedures EFM showed baseline of 135 with moderate variability. No decels. No contractions.    Assessment and Plan   Patient is a 25 year old G1PO at 32+6 who presents with malaise, sore throat, and nasal congestion starting last night. This likely represents a viral URI. Patient was offered reassurance and guidance to rest and stay hydrated, using conservative measures such as warm saltwater gargle for sore throat.   Melanie Cisneros 04/20/2016, 11:09 AM    OB FELLOW MAU DISCHARGE ATTESTATION  I have seen and examined this patient; I agree with above documentation in the resident's note. Examined the patient myself after the resident. Agree that this is likely URI, encouraged increase fluid intake, symptomatic relief (safe meds in pregnancy list given). Return if fevers begin and worsening symptoms.    I personally reviewed the patient's NST today, found to be REACTIVE. 135 bpm, mod var, +accels, no decels. CTX: None.   Melanie Basset, DO OB Fellow

## 2016-04-20 NOTE — MAU Note (Signed)
States she woke up with a sore throat, eyes burning and "I feel like when you know you are getting sick."

## 2016-04-27 ENCOUNTER — Ambulatory Visit (INDEPENDENT_AMBULATORY_CARE_PROVIDER_SITE_OTHER): Payer: Medicaid Other | Admitting: Medical

## 2016-04-27 VITALS — BP 110/77 | HR 105 | Wt 269.6 lb

## 2016-04-27 DIAGNOSIS — Z3403 Encounter for supervision of normal first pregnancy, third trimester: Secondary | ICD-10-CM

## 2016-04-27 NOTE — Patient Instructions (Signed)
Introduction Patient Name: ________________________________________________ Patient Due Date: ____________________ What is a fetal movement count? A fetal movement count is the number of times that you feel your baby move during a certain amount of time. This may also be called a fetal kick count. A fetal movement count is recommended for every pregnant woman. You may be asked to start counting fetal movements as early as week 28 of your pregnancy. Pay attention to when your baby is most active. You may notice your baby's sleep and wake cycles. You may also notice things that make your baby move more. You should do a fetal movement count:  When your baby is normally most active.  At the same time each day. A good time to count movements is while you are resting, after having something to eat and drink. How do I count fetal movements? 1. Find a quiet, comfortable area. Sit, or lie down on your side. 2. Write down the date, the start time and stop time, and the number of movements that you felt between those two times. Take this information with you to your health care visits. 3. For 2 hours, count kicks, flutters, swishes, rolls, and jabs. You should feel at least 10 movements during 2 hours. 4. You may stop counting after you have felt 10 movements. 5. If you do not feel 10 movements in 2 hours, have something to eat and drink. Then, keep resting and counting for 1 hour. If you feel at least 4 movements during that hour, you may stop counting. Contact a health care provider if:  You feel fewer than 4 movements in 2 hours.  Your baby is not moving like he or she usually does. Date: ____________ Start time: ____________ Stop time: ____________ Movements: ____________ Date: ____________ Start time: ____________ Stop time: ____________ Movements: ____________ Date: ____________ Start time: ____________ Stop time: ____________ Movements: ____________ Date: ____________ Start time: ____________  Stop time: ____________ Movements: ____________ Date: ____________ Start time: ____________ Stop time: ____________ Movements: ____________ Date: ____________ Start time: ____________ Stop time: ____________ Movements: ____________ Date: ____________ Start time: ____________ Stop time: ____________ Movements: ____________ Date: ____________ Start time: ____________ Stop time: ____________ Movements: ____________ Date: ____________ Start time: ____________ Stop time: ____________ Movements: ____________ This information is not intended to replace advice given to you by your health care provider. Make sure you discuss any questions you have with your health care provider. Document Released: 06/01/2006 Document Revised: 12/30/2015 Document Reviewed: 06/11/2015 Elsevier Interactive Patient Education  2017 [employer] of the uterus can occur throughout pregnancy. Contractions are not always a sign that you are in labor.  WHAT ARE BRAXTON HICKS CONTRACTIONS?  Contractions that occur before labor are called Braxton Hicks contractions, or false labor. Toward the end of pregnancy (32-34 weeks), these contractions can develop more often and may become more forceful. This is not true labor because these contractions do not result in opening (dilatation) and thinning of the cervix. They are sometimes difficult to tell apart from true labor because these contractions can be forceful and people have different pain tolerances. You should not feel embarrassed if you go to the hospital with false labor. Sometimes, the only way to tell if you are in true labor is for your health care provider to look for changes in the cervix. If there are no prenatal problems or other health problems associated with the pregnancy, it is completely safe to be sent home with false labor and await the onset of true labor.  HOW CAN YOU TELL THE DIFFERENCE BETWEEN TRUE AND FALSE LABOR? False Labor     The contractions of false labor are usually shorter and not as hard as those of true labor.   The contractions are usually irregular.   The contractions are often felt in the front of the lower abdomen and in the groin.   The contractions may go away when you walk around or change positions while lying down.   The contractions get weaker and are shorter lasting as time goes on.   The contractions do not usually become progressively stronger, regular, and closer together as with true labor.  True Labor   Contractions in true labor last 30-70 seconds, become very regular, usually become more intense, and increase in frequency.   The contractions do not go away with walking.   The discomfort is usually felt in the top of the uterus and spreads to the lower abdomen and low back.   True labor can be determined by your health care provider with an exam. This will show that the cervix is dilating and getting thinner.  WHAT TO REMEMBER  Keep up with your usual exercises and follow other instructions given by your health care provider.   Take medicines as directed by your health care provider.   Keep your regular prenatal appointments.   Eat and drink lightly if you think you are going into labor.   If Braxton Hicks contractions are making you uncomfortable:   Change your position from lying down or resting to walking, or from walking to resting.   Sit and rest in a tub of warm water.   Drink 2-3 glasses of water. Dehydration may cause these contractions.   Do slow and deep breathing several times an hour.  WHEN SHOULD I SEEK IMMEDIATE MEDICAL CARE? Seek immediate medical care if:  Your contractions become stronger, more regular, and closer together.   You have fluid leaking or gushing from your vagina.   You have a fever.   You pass blood-tinged mucus.   You have vaginal bleeding.   You have continuous abdominal pain.   You have low back pain  that you never had before.   You feel your baby's head pushing down and causing pelvic pressure.   Your baby is not moving as much as it used to.  This information is not intended to replace advice given to you by your health care provider. Make sure you discuss any questions you have with your health care provider. Document Released: 05/02/2005 Document Revised: 08/24/2015 Document Reviewed: 02/11/2013 Elsevier Interactive Patient Education  2017 [employer].

## 2016-04-27 NOTE — Progress Notes (Signed)
   PRENATAL VISIT NOTE  Subjective:  Melanie Cisneros is a 25 y.o. G1P0 at [redacted]w[redacted]d being seen today for ongoing prenatal care.  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  Patient reports suprapubic pain with ambulation.  Contractions: Not present. Vag. Bleeding: None.  Movement: Present. Denies leaking of fluid.   The following portions of the patient's history were reviewed and updated as appropriate: allergies, current medications, past family history, past medical history, past social history, past surgical history and problem list. Problem list updated.  Objective:   Vitals:   04/27/16 1620  BP: 110/77  Pulse: (!) 105  Weight: 269 lb 9.6 oz (122.3 kg)    Fetal Status: Fetal Heart Rate (bpm): 140 Fundal Height: 34 cm Movement: Present     General:  Alert, oriented and cooperative. Patient is in no acute distress.  Skin: Skin is warm and dry. No rash noted.   Cardiovascular: Normal heart rate noted  Respiratory: Normal respiratory effort, no problems with respiration noted  Abdomen: Soft, gravid, appropriate for gestational age. Pain/Pressure: Present     Pelvic:  Cervical exam deferred        Extremities: Normal range of motion.  Edema: None  Mental Status: Normal mood and affect. Normal behavior. Normal judgment and thought content.   Assessment and Plan:  Pregnancy: G1P0 at [redacted]w[redacted]d  1. Encounter for supervision of normal first pregnancy in third trimester - Advised to trial abdominal binder for pelvic pain  Preterm labor symptoms and general obstetric precautions including but not limited to vaginal bleeding, contractions, leaking of fluid and fetal movement were reviewed in detail with the patient. Please refer to After Visit Summary for  other counseling recommendations.  Return in about 2 weeks (around 05/11/2016) for LOB.   Luvenia Redden, PA-C

## 2016-05-11 ENCOUNTER — Other Ambulatory Visit (HOSPITAL_COMMUNITY)
Admission: RE | Admit: 2016-05-11 | Discharge: 2016-05-11 | Disposition: A | Discharge status: Home / Self Care | Payer: Medicaid Other | Source: Ambulatory Visit | Attending: Obstetrics & Gynecology | Admitting: Obstetrics & Gynecology

## 2016-05-11 ENCOUNTER — Ambulatory Visit (INDEPENDENT_AMBULATORY_CARE_PROVIDER_SITE_OTHER): Payer: Medicaid Other | Admitting: Obstetrics & Gynecology

## 2016-05-11 VITALS — BP 112/78 | HR 110 | Wt 274.8 lb

## 2016-05-11 DIAGNOSIS — Z3403 Encounter for supervision of normal first pregnancy, third trimester: Secondary | ICD-10-CM

## 2016-05-11 DIAGNOSIS — O99213 Obesity complicating pregnancy, third trimester: Secondary | ICD-10-CM

## 2016-05-11 DIAGNOSIS — O368130 Decreased fetal movements, third trimester, not applicable or unspecified: Secondary | ICD-10-CM | POA: Diagnosis not present

## 2016-05-11 DIAGNOSIS — E669 Obesity, unspecified: Secondary | ICD-10-CM

## 2016-05-11 DIAGNOSIS — O99323 Drug use complicating pregnancy, third trimester: Secondary | ICD-10-CM

## 2016-05-11 DIAGNOSIS — O99322 Drug use complicating pregnancy, second trimester: Secondary | ICD-10-CM

## 2016-05-11 DIAGNOSIS — O99210 Obesity complicating pregnancy, unspecified trimester: Secondary | ICD-10-CM | POA: Insufficient documentation

## 2016-05-11 DIAGNOSIS — Z113 Encounter for screening for infections with a predominantly sexual mode of transmission: Secondary | ICD-10-CM | POA: Diagnosis present

## 2016-05-11 LAB — OB RESULTS CONSOLE GC/CHLAMYDIA: Gonorrhea: NEGATIVE

## 2016-05-11 LAB — OB RESULTS CONSOLE GBS: GBS: NEGATIVE

## 2016-05-11 NOTE — Progress Notes (Signed)
   PRENATAL VISIT NOTE  Subjective:  Melanie Cisneros is a 25 y.o.  AA G1P0 at [redacted]w[redacted]d being seen today for ongoing prenatal care.  She is currently monitored for the following issues for this low-risk pregnancy and has Supervision of normal first pregnancy; Drug use affecting pregnancy in second trimester; Rubella non-immune status, antepartum; Psychosocial stressors; and Ptyalism on her problem list.  Patient reports decreased fetal movement for the last 2 days.   .  .   . Denies leaking of fluid.   The following portions of the patient's history were reviewed and updated as appropriate: allergies, current medications, past family history, past medical history, past social history, past surgical history and problem list. Problem list updated.  Objective:  There were no vitals filed for this visit.  Fetal Status:           General:  Alert, oriented and cooperative. Patient is in no acute distress.  Skin: Skin is warm and dry. No rash noted.   Cardiovascular: Normal heart rate noted  Respiratory: Normal respiratory effort, no problems with respiration noted  Abdomen: Soft, gravid, appropriate for gestational age.       Pelvic:  Cervical exam performed       cl/th/high  Extremities: Normal range of motion.     Mental Status: Normal mood and affect. Normal behavior. Normal judgment and thought content.   Assessment and Plan:  Pregnancy: G1P0 at [redacted]w[redacted]d  1. Encounter for supervision of normal first pregnancy in third trimester - NST today for decreased FM  2. Drug use affecting pregnancy in second trimester   Preterm labor symptoms and general obstetric precautions including but not limited to vaginal bleeding, contractions, leaking of fluid and fetal movement were reviewed in detail with the patient. Please refer to After Visit Summary for other counseling recommendations.  No Follow-up on file.   Emily Filbert, MD

## 2016-05-11 NOTE — Progress Notes (Signed)
36 wk cultures

## 2016-05-12 LAB — GC/CHLAMYDIA PROBE AMP (~~LOC~~) NOT AT ARMC
CHLAMYDIA, DNA PROBE: NEGATIVE
Neisseria Gonorrhea: NEGATIVE

## 2016-05-12 LAB — CULTURE, BETA STREP (GROUP B ONLY)

## 2016-05-16 NOTE — L&D Delivery Note (Addendum)
Operative Delivery Note FHR decels with pushing to 60s.   There was good recovery between, but deep variables persisted.  Consulted Dr Roselie Awkward for Vacuum assisted delivery.  At  a viable and healthy female was delivered via .  Presentation: Direct OP with Long Arc Rotation as head delivered to LOA; Position: Left,, Occiput,, Anterior; Station: +4.  Verbal consent: obtained from patient.  Risks and benefits discussed in detail.  Risks include, but are not limited to the risks of anesthesia, bleeding, infection, damage to maternal tissues, fetal cephalhematoma.  There is also the risk of inability to effect vaginal delivery of the head, or shoulder dystocia that cannot be resolved by established maneuvers, leading to the need for emergency cesarean section.  APGAR: , ; weight  .   Placenta status: spontaneous and grossly intact with 3VC , .   with the following complications: none  Anesthesia:  epidural Instruments: Kiwi Vacuum Episiotomy:  none Lacerations:  2nd degree midline perineal Suture Repair: 3.0 Monocryl Est. Blood Loss (mL):    Mom to postpartum.  Baby to Couplet care / Skin to Skin.  Hansel Feinstein 06/13/2016, 1:40 PM

## 2016-05-23 ENCOUNTER — Encounter: Payer: Self-pay | Admitting: Family Medicine

## 2016-05-29 ENCOUNTER — Inpatient Hospital Stay (HOSPITAL_COMMUNITY)
Admission: AD | Admit: 2016-05-29 | Discharge: 2016-05-29 | Disposition: A | Discharge status: Home / Self Care | Payer: Medicaid Other | Source: Ambulatory Visit | Attending: Obstetrics & Gynecology | Admitting: Obstetrics & Gynecology

## 2016-05-29 ENCOUNTER — Encounter (HOSPITAL_COMMUNITY): Payer: Self-pay

## 2016-05-29 DIAGNOSIS — R10819 Abdominal tenderness, unspecified site: Secondary | ICD-10-CM | POA: Insufficient documentation

## 2016-05-29 DIAGNOSIS — R109 Unspecified abdominal pain: Secondary | ICD-10-CM | POA: Diagnosis present

## 2016-05-29 DIAGNOSIS — Z8249 Family history of ischemic heart disease and other diseases of the circulatory system: Secondary | ICD-10-CM | POA: Insufficient documentation

## 2016-05-29 DIAGNOSIS — Z3A38 38 weeks gestation of pregnancy: Secondary | ICD-10-CM | POA: Diagnosis not present

## 2016-05-29 DIAGNOSIS — R10815 Periumbilic abdominal tenderness: Secondary | ICD-10-CM | POA: Diagnosis not present

## 2016-05-29 DIAGNOSIS — Z880 Allergy status to penicillin: Secondary | ICD-10-CM | POA: Diagnosis not present

## 2016-05-29 DIAGNOSIS — Z283 Underimmunization status: Secondary | ICD-10-CM

## 2016-05-29 DIAGNOSIS — O99210 Obesity complicating pregnancy, unspecified trimester: Secondary | ICD-10-CM

## 2016-05-29 DIAGNOSIS — O26893 Other specified pregnancy related conditions, third trimester: Secondary | ICD-10-CM | POA: Diagnosis not present

## 2016-05-29 DIAGNOSIS — O9989 Other specified diseases and conditions complicating pregnancy, childbirth and the puerperium: Secondary | ICD-10-CM

## 2016-05-29 DIAGNOSIS — O99322 Drug use complicating pregnancy, second trimester: Secondary | ICD-10-CM

## 2016-05-29 DIAGNOSIS — O09899 Supervision of other high risk pregnancies, unspecified trimester: Secondary | ICD-10-CM

## 2016-05-29 LAB — URINALYSIS, ROUTINE W REFLEX MICROSCOPIC
Bilirubin Urine: NEGATIVE
GLUCOSE, UA: NEGATIVE mg/dL
HGB URINE DIPSTICK: NEGATIVE
Ketones, ur: NEGATIVE mg/dL
Nitrite: NEGATIVE
Protein, ur: NEGATIVE mg/dL
SPECIFIC GRAVITY, URINE: 1.01 (ref 1.005–1.030)
pH: 7 (ref 5.0–8.0)

## 2016-05-29 NOTE — Discharge Instructions (Signed)
Braxton Hicks Contractions °Contractions of the uterus can occur throughout pregnancy. Contractions are not always a sign that you are in labor.  °WHAT ARE BRAXTON HICKS CONTRACTIONS?  °Contractions that occur before labor are called Braxton Hicks contractions, or false labor. Toward the end of pregnancy (32-34 weeks), these contractions can develop more often and may become more forceful. This is not true labor because these contractions do not result in opening (dilatation) and thinning of the cervix. They are sometimes difficult to tell apart from true labor because these contractions can be forceful and people have different pain tolerances. You should not feel embarrassed if you go to the hospital with false labor. Sometimes, the only way to tell if you are in true labor is for your health care provider to look for changes in the cervix. °If there are no prenatal problems or other health problems associated with the pregnancy, it is completely safe to be sent home with false labor and await the onset of true labor. °HOW CAN YOU TELL THE DIFFERENCE BETWEEN TRUE AND FALSE LABOR? °False Labor  °· The contractions of false labor are usually shorter and not as hard as those of true labor.   °· The contractions are usually irregular.   °· The contractions are often felt in the front of the lower abdomen and in the groin.   °· The contractions may go away when you walk around or change positions while lying down.   °· The contractions get weaker and are shorter lasting as time goes on.   °· The contractions do not usually become progressively stronger, regular, and closer together as with true labor.   °True Labor  °· Contractions in true labor last 30-70 seconds, become very regular, usually become more intense, and increase in frequency.   °· The contractions do not go away with walking.   °· The discomfort is usually felt in the top of the uterus and spreads to the lower abdomen and low back.   °· True labor can be  determined by your health care provider with an exam. This will show that the cervix is dilating and getting thinner.   °WHAT TO REMEMBER °· Keep up with your usual exercises and follow other instructions given by your health care provider.   °· Take medicines as directed by your health care provider.   °· Keep your regular prenatal appointments.   °· Eat and drink lightly if you think you are going into labor.   °· If Braxton Hicks contractions are making you uncomfortable:   °¨ Change your position from lying down or resting to walking, or from walking to resting.   °¨ Sit and rest in a tub of warm water.   °¨ Drink 2-3 glasses of water. Dehydration may cause these contractions.   °¨ Do slow and deep breathing several times an hour.   °WHEN SHOULD I SEEK IMMEDIATE MEDICAL CARE? °Seek immediate medical care if: °· Your contractions become stronger, more regular, and closer together.   °· You have fluid leaking or gushing from your vagina.   °· You have a fever.   °· You pass blood-tinged mucus.   °· You have vaginal bleeding.   °· You have continuous abdominal pain.   °· You have low back pain that you never had before.   °· You feel your baby's head pushing down and causing pelvic pressure.   °· Your baby is not moving as much as it used to.   °This information is not intended to replace advice given to you by your health care provider. Make sure you discuss any questions you have with your health care   provider. °Document Released: 05/02/2005 Document Revised: 08/24/2015 Document Reviewed: 02/11/2013 °Elsevier Interactive Patient Education © 2017 [employer] ° °

## 2016-05-29 NOTE — MAU Provider Note (Signed)
History     CSN: [id number]  Arrival date and time: 05/29/16 1336   None     Chief Complaint  Patient presents with  . Abdominal Pain   HPI Melanie Cisneros is a 26yo G1 @ 38.3wks who presents for eval of tenderness around umbilicus and low abd swelling that began this weekend. Denies leaking or bldg; no reg ctx. Her preg has been followed by the [facility] and has been remarkable for 1) social stressors  Pt mainly concerned because she thought that the baby's umbilical cord was internally attached to her umbilicus, and since her abd was tender she thought it indicated a fetal problem. Rev'd anatomy of the umbilical cord.  OB History    Gravida Para Term Preterm AB Living   1         0   SAB TAB Ectopic Multiple Live Births                  Past Medical History:  Diagnosis Date  . Bacterial vaginosis   . Chlamydia   . Gonorrhea   . Obesity   . UTI (lower urinary tract infection)     Past Surgical History:  Procedure Laterality Date  . WISDOM TOOTH EXTRACTION      Family History  Problem Relation Age of Onset  . Hypertension Mother     Social History  Substance Use Topics  . Smoking status: Never Smoker  . Smokeless tobacco: Never Used  . Alcohol use No    Allergies:  Allergies  Allergen Reactions  . Amoxicillin Anaphylaxis, Hives and Other (See Comments)    Has patient had a PCN reaction causing immediate rash, facial/tongue/throat swelling, SOB or lightheadedness with hypotension: Yes Has patient had a PCN reaction causing severe rash involving mucus membranes or skin necrosis: No Has patient had a PCN reaction that required hospitalization No Has patient had a PCN reaction occurring within the last 10 years: No If all of the above answers are "NO", then may proceed with Cephalosporin use.  Marland Kitchen Penicillins Anaphylaxis, Hives and Other (See Comments)    Has patient had a PCN reaction causing immediate rash, facial/tongue/throat swelling, SOB or lightheadedness  with hypotension: Yes Has patient had a PCN reaction causing severe rash involving mucus membranes or skin necrosis: No Has patient had a PCN reaction that required hospitalization No Has patient had a PCN reaction occurring within the last 10 years: No If all of the above answers are "NO", then may proceed with Cephalosporin use.    Prescriptions Prior to Admission  Medication Sig Dispense Refill Last Dose  . calcium carbonate (TUMS EX) 750 MG chewable tablet Chew 1 tablet by mouth as needed for heartburn.   05/29/2016 at Unknown time  . docusate sodium (COLACE) 100 MG capsule Take 1 capsule (100 mg total) by mouth 2 (two) times daily as needed for mild constipation. 60 capsule 2 05/28/2016 at Unknown time  . hydrOXYzine (VISTARIL) 50 MG capsule Take 50 mg by mouth 3 (three) times daily.   05/29/2016 at Unknown time  . ondansetron (ZOFRAN-ODT) 8 MG disintegrating tablet Take 1 tablet (8 mg total) by mouth every 8 (eight) hours as needed for nausea or vomiting. 40 tablet 3 05/28/2016 at Unknown time  . Prenatal MV-Min-FA-Omega-3 (PRENATAL GUMMIES/DHA & FA) 0.4-32.5 MG CHEW Chew 2 each by mouth daily.   05/28/2016 at Unknown time    Review of Systems No other pertinents other than what is listed in HPI Physical Exam  Blood pressure 107/87, pulse 104, temperature 98 F (36.7 C), temperature source Oral, resp. rate 18, last menstrual period 08/23/2015.  Physical Exam  Constitutional: She is oriented to person, place, and time. She appears well-developed.  HENT:  Head: Normocephalic.  Neck: Normal range of motion.  Cardiovascular: Normal rate.   Sl tachycardic  Respiratory: Effort normal.  GI:  EFM 130s, +accels, no decels Irreg mild ctx  At base of pannus skin has a peau d' orange appearance Around umbilicus: tender to touch No evidence of hernia  Genitourinary:  Genitourinary Comments: Cx C/L  Musculoskeletal: Normal range of motion.  Neurological: She is alert and oriented to  person, place, and time.  Skin: Skin is warm and dry.  Psychiatric: She has a normal mood and affect. Her behavior is normal. Thought content normal.   Urinalysis    Component Value Date/Time   COLORURINE YELLOW 05/29/2016 1341   APPEARANCEUR HAZY (A) 05/29/2016 1341   LABSPEC 1.010 05/29/2016 1341   PHURINE 7.0 05/29/2016 1341   GLUCOSEU NEGATIVE 05/29/2016 1341   HGBUR NEGATIVE 05/29/2016 1341   BILIRUBINUR NEGATIVE 05/29/2016 1341   KETONESUR NEGATIVE 05/29/2016 1341   PROTEINUR NEGATIVE 05/29/2016 1341   UROBILINOGEN 0.2 01/27/2016 1050   NITRITE NEGATIVE 05/29/2016 1341   LEUKOCYTESUR LARGE (A) 05/29/2016 1341   Micro: rare bacteria, 6-30 SE  MAU Course  Procedures  MDM UA ordered  Assessment and Plan  IUP@38 .3wks Abd tenderness  D/C home with labor precautions Reassured re tender abd skin F/U at next scheduled OB visit  Serita Grammes CNM 05/29/2016, 2:32 PM

## 2016-05-29 NOTE — MAU Note (Signed)
Pt presents to MAU with pain and brusing around her belly button that began about a month ago. Reports pain has increasingly gotten worse. Reports pain increases with fetal movement, getting up and getting down. Has not taken anything for pain, states she puts cold water on it and waits for the pain to stop. Pt denies any bleeding or leaking of fluid. Reports good fetal movement.

## 2016-05-30 ENCOUNTER — Encounter: Payer: Self-pay | Admitting: Family Medicine

## 2016-05-31 ENCOUNTER — Encounter: Payer: Self-pay | Admitting: Advanced Practice Midwife

## 2016-05-31 ENCOUNTER — Ambulatory Visit (INDEPENDENT_AMBULATORY_CARE_PROVIDER_SITE_OTHER): Payer: Medicaid Other | Admitting: Advanced Practice Midwife

## 2016-05-31 VITALS — BP 129/80 | HR 108 | Wt 288.3 lb

## 2016-05-31 DIAGNOSIS — Z3403 Encounter for supervision of normal first pregnancy, third trimester: Secondary | ICD-10-CM

## 2016-05-31 DIAGNOSIS — O26843 Uterine size-date discrepancy, third trimester: Secondary | ICD-10-CM

## 2016-05-31 DIAGNOSIS — Z3A39 39 weeks gestation of pregnancy: Secondary | ICD-10-CM

## 2016-05-31 NOTE — Patient Instructions (Signed)
Braxton Hicks Contractions °Contractions of the uterus can occur throughout pregnancy. Contractions are not always a sign that you are in labor.  °WHAT ARE BRAXTON HICKS CONTRACTIONS?  °Contractions that occur before labor are called Braxton Hicks contractions, or false labor. Toward the end of pregnancy (32-34 weeks), these contractions can develop more often and may become more forceful. This is not true labor because these contractions do not result in opening (dilatation) and thinning of the cervix. They are sometimes difficult to tell apart from true labor because these contractions can be forceful and people have different pain tolerances. You should not feel embarrassed if you go to the hospital with false labor. Sometimes, the only way to tell if you are in true labor is for your health care provider to look for changes in the cervix. °If there are no prenatal problems or other health problems associated with the pregnancy, it is completely safe to be sent home with false labor and await the onset of true labor. °HOW CAN YOU TELL THE DIFFERENCE BETWEEN TRUE AND FALSE LABOR? °False Labor  °· The contractions of false labor are usually shorter and not as hard as those of true labor.   °· The contractions are usually irregular.   °· The contractions are often felt in the front of the lower abdomen and in the groin.   °· The contractions may go away when you walk around or change positions while lying down.   °· The contractions get weaker and are shorter lasting as time goes on.   °· The contractions do not usually become progressively stronger, regular, and closer together as with true labor.   °True Labor  °· Contractions in true labor last 30-70 seconds, become very regular, usually become more intense, and increase in frequency.   °· The contractions do not go away with walking.   °· The discomfort is usually felt in the top of the uterus and spreads to the lower abdomen and low back.   °· True labor can be  determined by your health care provider with an exam. This will show that the cervix is dilating and getting thinner.   °WHAT TO REMEMBER °· Keep up with your usual exercises and follow other instructions given by your health care provider.   °· Take medicines as directed by your health care provider.   °· Keep your regular prenatal appointments.   °· Eat and drink lightly if you think you are going into labor.   °· If Braxton Hicks contractions are making you uncomfortable:   °¨ Change your position from lying down or resting to walking, or from walking to resting.   °¨ Sit and rest in a tub of warm water.   °¨ Drink 2-3 glasses of water. Dehydration may cause these contractions.   °¨ Do slow and deep breathing several times an hour.   °WHEN SHOULD I SEEK IMMEDIATE MEDICAL CARE? °Seek immediate medical care if: °· Your contractions become stronger, more regular, and closer together.   °· You have fluid leaking or gushing from your vagina.   °· You have a fever.   °· You pass blood-tinged mucus.   °· You have vaginal bleeding.   °· You have continuous abdominal pain.   °· You have low back pain that you never had before.   °· You feel your baby's head pushing down and causing pelvic pressure.   °· Your baby is not moving as much as it used to.   °This information is not intended to replace advice given to you by your health care provider. Make sure you discuss any questions you have with your health care   provider. °Document Released: 05/02/2005 Document Revised: 08/24/2015 Document Reviewed: 02/11/2013 °Elsevier Interactive Patient Education © 2017 [employer] ° °

## 2016-05-31 NOTE — Progress Notes (Signed)
   PRENATAL VISIT NOTE  Subjective:  Melanie Cisneros is a 26 y.o. G1P0 at [redacted]w[redacted]d being seen today for ongoing prenatal care.  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  Patient reports no complaints.  Contractions: Not present.  .  Movement: Present. Denies leaking of fluid.   The following portions of the patient's history were reviewed and updated as appropriate: allergies, current medications, past family history, past medical history, past social history, past surgical history and problem list. Problem list updated.  Objective:   Vitals:   05/31/16 1427  BP: 129/80  Pulse: (!) 108  Weight: 288 lb 4.8 oz (130.8 kg)    Fetal Status: Fetal Heart Rate (bpm): 154 Fundal Height: 42 cm Movement: Present     Vtx by [place] TWG 58 lb  General:  Alert, oriented and cooperative. Patient is in no acute distress.  Skin: Skin is warm and dry. No rash noted.   Cardiovascular: Normal heart rate noted  Respiratory: Normal respiratory effort, no problems with respiration noted  Abdomen: Soft, gravid, appropriate for gestational age. Pain/Pressure: Present     Pelvic:  Cervical exam deferred        Extremities: Normal range of motion.  Edema: None  Mental Status: Normal mood and affect. Normal behavior. Normal judgment and thought content.   Assessment and Plan:  Pregnancy: G1P0 at [redacted]w[redacted]d  1. Uterine size date discrepancy, antepartum, third trimester  - [place] MFM OB FOLLOW UP; Future  2. [redacted] weeks gestation of pregnancy  - [place] MFM OB FOLLOW UP; Future  3. Encounter for supervision of normal first pregnancy in third trimester   Term labor symptoms and general obstetric precautions including but not limited to vaginal bleeding,  contractions, leaking of fluid and fetal movement were reviewed in detail with the patient. Please refer to After Visit Summary for other counseling recommendations.  Return in 1 week (on 06/07/2016).     Manya Silvas, CNM

## 2016-06-06 ENCOUNTER — Ambulatory Visit (HOSPITAL_COMMUNITY)
Admission: RE | Admit: 2016-06-06 | Discharge: 2016-06-06 | Disposition: A | Discharge status: Home / Self Care | Payer: Medicaid Other | Source: Ambulatory Visit | Attending: Advanced Practice Midwife | Admitting: Advanced Practice Midwife

## 2016-06-06 ENCOUNTER — Ambulatory Visit (INDEPENDENT_AMBULATORY_CARE_PROVIDER_SITE_OTHER): Payer: Medicaid Other | Admitting: Obstetrics and Gynecology

## 2016-06-06 VITALS — BP 131/80 | HR 103 | Wt 290.3 lb

## 2016-06-06 DIAGNOSIS — Z3A39 39 weeks gestation of pregnancy: Secondary | ICD-10-CM | POA: Diagnosis present

## 2016-06-06 DIAGNOSIS — O99210 Obesity complicating pregnancy, unspecified trimester: Secondary | ICD-10-CM

## 2016-06-06 DIAGNOSIS — O26843 Uterine size-date discrepancy, third trimester: Secondary | ICD-10-CM | POA: Insufficient documentation

## 2016-06-06 DIAGNOSIS — Z362 Encounter for other antenatal screening follow-up: Secondary | ICD-10-CM | POA: Diagnosis present

## 2016-06-06 DIAGNOSIS — O99323 Drug use complicating pregnancy, third trimester: Secondary | ICD-10-CM | POA: Diagnosis not present

## 2016-06-06 DIAGNOSIS — Z6841 Body Mass Index (BMI) 40.0 and over, adult: Secondary | ICD-10-CM

## 2016-06-06 DIAGNOSIS — E669 Obesity, unspecified: Secondary | ICD-10-CM

## 2016-06-06 DIAGNOSIS — O0993 Supervision of high risk pregnancy, unspecified, third trimester: Secondary | ICD-10-CM

## 2016-06-06 LAB — POCT URINALYSIS DIP (DEVICE)
BILIRUBIN URINE: NEGATIVE
GLUCOSE, UA: NEGATIVE mg/dL
Hgb urine dipstick: NEGATIVE
KETONES UR: NEGATIVE mg/dL
Nitrite: NEGATIVE
Protein, ur: NEGATIVE mg/dL
Specific Gravity, Urine: 1.015 (ref 1.005–1.030)
Urobilinogen, UA: 0.2 mg/dL (ref 0.0–1.0)
pH: 5.5 (ref 5.0–8.0)

## 2016-06-06 NOTE — Progress Notes (Signed)
Prenatal Visit Note Date: 06/06/2016 Clinic: [facility]  Subjective:  Melanie Cisneros is a 26 y.o. G1P0 at [redacted]w[redacted]d being seen today for ongoing prenatal care.  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  Patient reports that she'd like a c-section b/c her belly was measuring large at her last OB visit.  Contractions: Irritability. Vag. Bleeding: None.  Movement: Present. Denies leaking of fluid.   The following portions of the patient's history were reviewed and updated as appropriate: allergies, current medications, past family history, past medical history, past social history, past surgical history and problem list. Problem list updated.  Objective:   Vitals:   06/06/16 1328 06/06/16 1348  BP: 140/82 131/80  Pulse: (!) 103   Weight: 290 lb 4.8 oz (131.7 kg)     Fetal Status: Fetal Heart Rate (bpm): 150   Movement: Present     General:  Alert, oriented and cooperative. Patient is in no acute distress.  Skin: Skin is warm and dry. No rash noted.   Cardiovascular: Normal heart rate noted  Respiratory: Normal respiratory effort, no problems with respiration noted  Abdomen: Soft, gravid, appropriate for gestational age. Pain/Pressure: Present     Pelvic:  Cervical exam deferred        Extremities: Normal range of motion.  Edema: Mild pitting, slight indentation  Mental Status: Normal mood and affect. Normal behavior. Normal judgment and thought content.   Urinalysis: Urine Protein: Negative Urine Glucose: Negative  Assessment and Plan:  Pregnancy: G1P0 at [redacted]w[redacted]d  1. BMI 45.0-49.9, adult ([place]) See below  2. Supervision of high risk pregnancy in third  trimester Routine care. Rpt BP negative and u/a negative. Pre-x precautions given.  Set up IOL next visit for post dates  3. S>D Has growth u/s later today. D/w her benefits of trying for VD.   Term labor symptoms and general obstetric precautions including but not limited to vaginal bleeding, contractions, leaking of fluid and fetal movement were reviewed in detail with the patient. Please refer to After Visit Summary for other counseling recommendations.  Return in about 3 days (around 06/09/2016) for 3-4d BP only visit. 1wk rob.   Aletha Halim, MD

## 2016-06-07 ENCOUNTER — Telehealth: Payer: Self-pay

## 2016-06-07 ENCOUNTER — Other Ambulatory Visit (HOSPITAL_COMMUNITY): Payer: Self-pay | Admitting: *Deleted

## 2016-06-07 DIAGNOSIS — O288 Other abnormal findings on antenatal screening of mother: Secondary | ICD-10-CM

## 2016-06-07 NOTE — Telephone Encounter (Signed)
Patient called stating she lost her mucous plug this am. She is concerned. I advised patient this is the body way and clearing out for the delivery of baby and there is nothing to be done. I have discuss signs of labor with patient. Patient voice understanding at this time.

## 2016-06-08 ENCOUNTER — Inpatient Hospital Stay (HOSPITAL_COMMUNITY)
Admission: AD | Admit: 2016-06-08 | Discharge: 2016-06-08 | Disposition: A | Discharge status: Home / Self Care | Payer: Medicaid Other | Source: Ambulatory Visit | Attending: Obstetrics & Gynecology | Admitting: Obstetrics & Gynecology

## 2016-06-08 ENCOUNTER — Encounter: Payer: Self-pay | Admitting: Advanced Practice Midwife

## 2016-06-08 DIAGNOSIS — Z3A40 40 weeks gestation of pregnancy: Secondary | ICD-10-CM | POA: Insufficient documentation

## 2016-06-08 DIAGNOSIS — Z3493 Encounter for supervision of normal pregnancy, unspecified, third trimester: Secondary | ICD-10-CM | POA: Insufficient documentation

## 2016-06-08 DIAGNOSIS — O2600 Excessive weight gain in pregnancy, unspecified trimester: Secondary | ICD-10-CM | POA: Insufficient documentation

## 2016-06-08 NOTE — Progress Notes (Addendum)
G1P0 @ 39.[redacted] wksga. Presents to triage for labor. States lost mucus plug yesterday and some today. States ctx stopped. Denies LOF or bleeding.   Provider in unit. Report status of pt given. Orders received to discharge pt home.   2210: Discharge instructions given with pt understanding. Ice-chips given.   2220: Pt left unit via ambulatory with family

## 2016-06-10 ENCOUNTER — Encounter (HOSPITAL_COMMUNITY): Payer: Self-pay

## 2016-06-10 ENCOUNTER — Inpatient Hospital Stay (HOSPITAL_COMMUNITY)
Admission: AD | Admit: 2016-06-10 | Discharge: 2016-06-10 | Disposition: A | Discharge status: Home / Self Care | Payer: Medicaid Other | Source: Ambulatory Visit | Attending: Obstetrics & Gynecology | Admitting: Obstetrics & Gynecology

## 2016-06-10 ENCOUNTER — Ambulatory Visit: Payer: Self-pay

## 2016-06-10 DIAGNOSIS — Z3A00 Weeks of gestation of pregnancy not specified: Secondary | ICD-10-CM | POA: Diagnosis not present

## 2016-06-10 DIAGNOSIS — Z3490 Encounter for supervision of normal pregnancy, unspecified, unspecified trimester: Secondary | ICD-10-CM | POA: Diagnosis not present

## 2016-06-10 NOTE — Discharge Instructions (Signed)
Introduction Patient Name: ________________________________________________ Patient Due Date: ____________________ What is a fetal movement count? A fetal movement count is the number of times that you feel your baby move during a certain amount of time. This may also be called a fetal kick count. A fetal movement count is recommended for every pregnant woman. You may be asked to start counting fetal movements as early as week 28 of your pregnancy. Pay attention to when your baby is most active. You may notice your baby's sleep and wake cycles. You may also notice things that make your baby move more. You should do a fetal movement count:  When your baby is normally most active.  At the same time each day. A good time to count movements is while you are resting, after having something to eat and drink. How do I count fetal movements? 1. Find a quiet, comfortable area. Sit, or lie down on your side. 2. Write down the date, the start time and stop time, and the number of movements that you felt between those two times. Take this information with you to your health care visits. 3. For 2 hours, count kicks, flutters, swishes, rolls, and jabs. You should feel at least 10 movements during 2 hours. 4. You may stop counting after you have felt 10 movements. 5. If you do not feel 10 movements in 2 hours, have something to eat and drink. Then, keep resting and counting for 1 hour. If you feel at least 4 movements during that hour, you may stop counting. Contact a health care provider if:  You feel fewer than 4 movements in 2 hours.  Your baby is not moving like he or she usually does. Date: ____________ Start time: ____________ Stop time: ____________ Movements: ____________ Date: ____________ Start time: ____________ Stop time: ____________ Movements: ____________ Date: ____________ Start time: ____________ Stop time: ____________ Movements: ____________ Date: ____________ Start time: ____________  Stop time: ____________ Movements: ____________ Date: ____________ Start time: ____________ Stop time: ____________ Movements: ____________ Date: ____________ Start time: ____________ Stop time: ____________ Movements: ____________ Date: ____________ Start time: ____________ Stop time: ____________ Movements: ____________ Date: ____________ Start time: ____________ Stop time: ____________ Movements: ____________ Date: ____________ Start time: ____________ Stop time: ____________ Movements: ____________ This information is not intended to replace advice given to you by your health care provider. Make sure you discuss any questions you have with your health care provider. Document Released: 06/01/2006 Document Revised: 12/30/2015 Document Reviewed: 06/11/2015 Elsevier Interactive Patient Education  2017 [place]. Vaginal Delivery Vaginal delivery means that you will give birth by pushing your baby out of your birth canal (vagina). A team of health care providers will help you before, during, and after vaginal delivery. Birth experiences are unique for every woman and every pregnancy, and birth experiences vary depending on where you choose to give birth. What should I do to prepare for my baby's birth? Before your baby is born, it is important to talk with your health care provider about:  Your labor and delivery preferences. These may include:  Medicines that you may be given.  How you will manage your pain. This might include non-medical pain relief techniques or injectable pain relief such as epidural analgesia.  How you and your baby will be monitored during labor and delivery.  Who may be in the labor and delivery room with you.  Your feelings about surgical delivery of your baby (cesarean delivery, or C-section) if this becomes necessary.  Your feelings about receiving donated blood through an  IV tube (blood transfusion) if this becomes necessary.  Whether you are able:  To  take pictures or videos of the birth.  To eat during labor and delivery.  To move around, walk, or change positions during labor and delivery.  What to expect after your baby is born, such as:  Whether delayed umbilical cord clamping and cutting is offered.  Who will care for your baby right after birth.  Medicines or tests that may be recommended for your baby.  Whether breastfeeding is supported in your hospital or birth center.  How long you will be in the hospital or birth center.  How any medical conditions you have may affect your baby or your labor and delivery experience. To prepare for your baby's birth, you should also:  Attend all of your health care visits before delivery (prenatal visits) as recommended by your health care provider. This is important.  Prepare your home for your baby's arrival. Make sure that you have:  Diapers.  Baby clothing.  Feeding equipment.  Safe sleeping arrangements for you and your baby.  Install a car seat in your vehicle. Have your car seat checked by a certified car seat installer to make sure that it is installed safely.  Think about who will help you with your new baby at home for at least the first several weeks after delivery. What can I expect when I arrive at the birth center or hospital? Once you are in labor and have been admitted into the hospital or birth center, your health care provider may:  Review your pregnancy history and any concerns you have.  Insert an IV tube into one of your veins. This is used to give you fluids and medicines.  Check your blood pressure, pulse, temperature, and heart rate (vital signs).  Check whether your bag of water (amniotic sac) has broken (ruptured).  Talk with you about your birth plan and discuss pain control options. Monitoring Your health care provider may monitor your contractions (uterine monitoring) and your baby's heart rate (fetal monitoring). You may need to be  monitored:  Often, but not continuously (intermittently).  All the time or for long periods at a time (continuously). Continuous monitoring may be needed if:  You are taking certain medicines, such as medicine to relieve pain or make your contractions stronger.  You have pregnancy or labor complications. Monitoring may be done by:  Placing a special stethoscope or a handheld monitoring device on your abdomen to check your baby's heartbeat, and feeling your abdomen for contractions. This method of monitoring does not continuously record your baby's heartbeat or your contractions.  Placing monitors on your abdomen (external monitors) to record your baby's heartbeat and the frequency and length of contractions. You may not have to wear external monitors all the time.  Placing monitors inside of your uterus (internal monitors) to record your baby's heartbeat and the frequency, length, and strength of your contractions.  Your health care provider may use internal monitors if he or she needs more information about the strength of your contractions or your baby's heart rate.  Internal monitors are put in place by passing a thin, flexible wire through your vagina and into your uterus. Depending on the type of monitor, it may remain in your uterus or on your baby's head until birth.  Your health care provider will discuss the benefits and risks of internal monitoring with you and will ask for your permission before inserting the monitors.  Telemetry. This is a  type of continuous monitoring that can be done with external or internal monitors. Instead of having to stay in bed, you are able to move around during telemetry. Ask your health care provider if telemetry is an option for you. Physical exam Your health care provider may perform a physical exam. This may include:  Checking whether your baby is positioned:  With the head toward your vagina (head-down). This is most common.  With the head  toward the top of your uterus (head-up or breech). If your baby is in a breech position, your health care provider may try to turn your baby to a head-down position so you can deliver vaginally. If it does not seem that your baby can be born vaginally, your provider may recommend surgery to deliver your baby. In rare cases, you may be able to deliver vaginally if your baby is head-up (breech delivery).  Lying sideways (transverse). Babies that are lying sideways cannot be delivered vaginally.  Checking your cervix to determine:  Whether it is thinning out (effacing).  Whether it is opening up (dilating).  How low your baby has moved into your birth canal. What are the three stages of labor and delivery?   Normal labor and delivery is divided into the following three stages: Stage 1  Stage 1 is the longest stage of labor, and it can last for hours or days. Stage 1 includes:  Early labor. This is when contractions may be irregular, or regular and mild. Generally, early labor contractions are more than 10 minutes apart.  Active labor. This is when contractions get longer, more regular, more frequent, and more intense.  The transition phase. This is when contractions happen very close together, are very intense, and may last longer than during any other part of labor.  Contractions generally feel mild, infrequent, and irregular at first. They get stronger, more frequent (about every 2-3 minutes), and more regular as you progress from early labor through active labor and transition.  Many women progress through stage 1 naturally, but you may need help to continue making progress. If this happens, your health care provider may talk with you about:  Rupturing your amniotic sac if it has not ruptured yet.  Giving you medicine to help make your contractions stronger and more frequent.  Stage 1 ends when your cervix is completely dilated to 4 inches (10 cm) and completely effaced. This happens  at the end of the transition phase. Stage 2  Once your cervix is completely effaced and dilated to 4 inches (10 cm), you may start to feel an urge to push. It is common for the body to naturally take a rest before feeling the urge to push, especially if you received an epidural or certain other pain medicines. This rest period may last for up to 1-2 hours, depending on your unique labor experience.  During stage 2, contractions are generally less painful, because pushing helps relieve contraction pain. Instead of contraction pain, you may feel stretching and burning pain, especially when the widest part of your baby's head passes through the vaginal opening (crowning).  Your health care provider will closely monitor your pushing progress and your baby's progress through the vagina during stage 2.  Your health care provider may massage the area of skin between your vaginal opening and anus (perineum) or apply warm compresses to your perineum. This helps it stretch as the baby's head starts to crown, which can help prevent perineal tearing.  In some cases, an incision may  be made in your perineum (episiotomy) to allow the baby to pass through the vaginal opening. An episiotomy helps to make the opening of the vagina larger to allow more room for the baby to fit through.  It is very important to breathe and focus so your health care provider can control the delivery of your baby's head. Your health care provider may have you decrease the intensity of your pushing, to help prevent perineal tearing.  After delivery of your baby's head, the shoulders and the rest of the body generally deliver very quickly and without difficulty.  Once your baby is delivered, the umbilical cord may be cut right away, or this may be delayed for 1-2 minutes, depending on your baby's health. This may vary among health care providers, hospitals, and birth centers.  If you and your baby are healthy enough, your baby may be  placed on your chest or abdomen to help maintain the baby's temperature and to help you bond with each other. Some mothers and babies start breastfeeding at this time. Your health care team will dry your baby and help keep your baby warm during this time.  Your baby may need immediate care if he or she:  Showed signs of distress during labor.  Has a medical condition.  Was born too early (prematurely).  Had a bowel movement before birth (meconium).  Shows signs of difficulty transitioning from being inside the uterus to being outside of the uterus. If you are planning to breastfeed, your health care team will help you begin a feeding. Stage 3  The third stage of labor starts immediately after the birth of your baby and ends after you deliver the placenta. The placenta is an organ that develops during pregnancy to provide oxygen and nutrients to your baby in the womb.  Delivering the placenta may require some pushing, and you may have mild contractions. Breastfeeding can stimulate contractions to help you deliver the placenta.  After the placenta is delivered, your uterus should tighten (contract) and become firm. This helps to stop bleeding in your uterus. To help your uterus contract and to control bleeding, your health care provider may:  Give you medicine by injection, through an IV tube, by mouth, or through your rectum (rectally).  Massage your abdomen or perform a vaginal exam to remove any blood clots that are left in your uterus.  Empty your bladder by placing a thin, flexible tube (catheter) into your bladder.  Encourage you to breastfeed your baby. After labor is over, you and your baby will be monitored closely to ensure that you are both healthy until you are ready to go home. Your health care team will teach you how to care for yourself and your baby. This information is not intended to replace advice given to you by your health care provider. Make sure you discuss any  questions you have with your health care provider. Document Released: 02/09/2008 Document Revised: 11/20/2015 Document Reviewed: 05/17/2015 Elsevier Interactive Patient Education  2017 [employer].

## 2016-06-10 NOTE — MAU Note (Signed)
Urine sent to lab @1539 

## 2016-06-10 NOTE — MAU Note (Signed)
Patient presents with contractions every 10-15 minutes apart today, denies vaginal bleeding, having mucus discharge.

## 2016-06-12 ENCOUNTER — Inpatient Hospital Stay (HOSPITAL_COMMUNITY): Payer: Medicaid Other | Admitting: Anesthesiology

## 2016-06-12 ENCOUNTER — Inpatient Hospital Stay (HOSPITAL_COMMUNITY): Payer: Medicaid Other

## 2016-06-12 ENCOUNTER — Encounter (HOSPITAL_COMMUNITY): Payer: Self-pay | Admitting: *Deleted

## 2016-06-12 ENCOUNTER — Inpatient Hospital Stay (HOSPITAL_COMMUNITY)
Admission: AD | Admit: 2016-06-12 | Discharge: 2016-06-15 | DRG: 775 | Disposition: A | Discharge status: Home / Self Care | Payer: Medicaid Other | Source: Ambulatory Visit | Attending: Obstetrics & Gynecology | Admitting: Obstetrics & Gynecology

## 2016-06-12 DIAGNOSIS — E669 Obesity, unspecified: Secondary | ICD-10-CM | POA: Diagnosis present

## 2016-06-12 DIAGNOSIS — Z6841 Body Mass Index (BMI) 40.0 and over, adult: Secondary | ICD-10-CM

## 2016-06-12 DIAGNOSIS — O26893 Other specified pregnancy related conditions, third trimester: Secondary | ICD-10-CM | POA: Diagnosis present

## 2016-06-12 DIAGNOSIS — O09899 Supervision of other high risk pregnancies, unspecified trimester: Secondary | ICD-10-CM

## 2016-06-12 DIAGNOSIS — Z3493 Encounter for supervision of normal pregnancy, unspecified, third trimester: Secondary | ICD-10-CM | POA: Diagnosis present

## 2016-06-12 DIAGNOSIS — O99214 Obesity complicating childbirth: Secondary | ICD-10-CM | POA: Diagnosis present

## 2016-06-12 DIAGNOSIS — O2600 Excessive weight gain in pregnancy, unspecified trimester: Secondary | ICD-10-CM

## 2016-06-12 DIAGNOSIS — Z3A40 40 weeks gestation of pregnancy: Secondary | ICD-10-CM | POA: Diagnosis not present

## 2016-06-12 DIAGNOSIS — Z8249 Family history of ischemic heart disease and other diseases of the circulatory system: Secondary | ICD-10-CM

## 2016-06-12 DIAGNOSIS — O26843 Uterine size-date discrepancy, third trimester: Secondary | ICD-10-CM

## 2016-06-12 DIAGNOSIS — O99322 Drug use complicating pregnancy, second trimester: Secondary | ICD-10-CM

## 2016-06-12 DIAGNOSIS — O9989 Other specified diseases and conditions complicating pregnancy, childbirth and the puerperium: Secondary | ICD-10-CM

## 2016-06-12 DIAGNOSIS — O0993 Supervision of high risk pregnancy, unspecified, third trimester: Secondary | ICD-10-CM

## 2016-06-12 DIAGNOSIS — R030 Elevated blood-pressure reading, without diagnosis of hypertension: Secondary | ICD-10-CM | POA: Diagnosis present

## 2016-06-12 DIAGNOSIS — O99210 Obesity complicating pregnancy, unspecified trimester: Secondary | ICD-10-CM

## 2016-06-12 DIAGNOSIS — Z283 Underimmunization status: Secondary | ICD-10-CM

## 2016-06-12 LAB — TYPE AND SCREEN
ABO/RH(D): B POS
ANTIBODY SCREEN: NEGATIVE

## 2016-06-12 LAB — CBC
HCT: 30.7 % — ABNORMAL LOW (ref 36.0–46.0)
HEMOGLOBIN: 10.2 g/dL — AB (ref 12.0–15.0)
MCH: 24.5 pg — ABNORMAL LOW (ref 26.0–34.0)
MCHC: 33.2 g/dL (ref 30.0–36.0)
MCV: 73.6 fL — ABNORMAL LOW (ref 78.0–100.0)
PLATELETS: 408 10*3/uL — AB (ref 150–400)
RBC: 4.17 MIL/uL (ref 3.87–5.11)
RDW: 15.4 % (ref 11.5–15.5)
WBC: 12.8 10*3/uL — AB (ref 4.0–10.5)

## 2016-06-12 LAB — ABO/RH: ABO/RH(D): B POS

## 2016-06-12 MED ORDER — OXYTOCIN BOLUS FROM INFUSION
500.0000 mL | Freq: Once | INTRAVENOUS | Status: AC
  Administered 2016-06-13: 500 mL via INTRAVENOUS

## 2016-06-12 MED ORDER — ONDANSETRON HCL 4 MG/2ML IJ SOLN: 4.0000 mg | Freq: Four times a day (QID) | INTRAMUSCULAR | Status: DC | PRN

## 2016-06-12 MED ORDER — PHENYLEPHRINE 40 MCG/ML (10ML) SYRINGE FOR IV PUSH (FOR BLOOD PRESSURE SUPPORT)
80.0000 ug | PREFILLED_SYRINGE | INTRAVENOUS | Status: DC | PRN
  Administered 2016-06-13: 80 ug via INTRAVENOUS
  Filled 2016-06-12: qty 5

## 2016-06-12 MED ORDER — LACTATED RINGERS IV SOLN: 500.0000 mL | Freq: Once | INTRAVENOUS | Status: DC

## 2016-06-12 MED ORDER — FLEET ENEMA 7-19 GM/118ML RE ENEM
1.0000 | ENEMA | RECTAL | Status: DC | PRN
Start: 2016-06-12 — End: 2016-06-13

## 2016-06-12 MED ORDER — LACTATED RINGERS IV SOLN
500.0000 mL | Freq: Once | INTRAVENOUS | Status: AC
  Administered 2016-06-12: 500 mL via INTRAVENOUS

## 2016-06-12 MED ORDER — PHENYLEPHRINE 40 MCG/ML (10ML) SYRINGE FOR IV PUSH (FOR BLOOD PRESSURE SUPPORT)
80.0000 ug | PREFILLED_SYRINGE | INTRAVENOUS | Status: DC | PRN
  Filled 2016-06-12: qty 10
  Filled 2016-06-12: qty 5
  Filled 2016-06-12: qty 10

## 2016-06-12 MED ORDER — EPHEDRINE 5 MG/ML INJ
10.0000 mg | INTRAVENOUS | Status: DC | PRN
  Filled 2016-06-12: qty 4

## 2016-06-12 MED ORDER — OXYCODONE-ACETAMINOPHEN 5-325 MG PO TABS: 1.0000 | ORAL_TABLET | ORAL | Status: DC | PRN

## 2016-06-12 MED ORDER — LACTATED RINGERS IV SOLN
INTRAVENOUS | Status: DC
  Administered 2016-06-12 – 2016-06-13 (×3): via INTRAVENOUS

## 2016-06-12 MED ORDER — FENTANYL CITRATE (PF) 100 MCG/2ML IJ SOLN
50.0000 ug | INTRAMUSCULAR | Status: DC | PRN
  Administered 2016-06-12 (×4): 100 ug via INTRAVENOUS
  Filled 2016-06-12 (×4): qty 2

## 2016-06-12 MED ORDER — OXYTOCIN 40 UNITS IN LACTATED RINGERS INFUSION - SIMPLE MED: 2.5000 [IU]/h | INTRAVENOUS | Status: DC

## 2016-06-12 MED ORDER — ACETAMINOPHEN 325 MG PO TABS: 650.0000 mg | ORAL_TABLET | ORAL | Status: DC | PRN

## 2016-06-12 MED ORDER — OXYTOCIN 40 UNITS IN LACTATED RINGERS INFUSION - SIMPLE MED
1.0000 m[IU]/min | INTRAVENOUS | Status: DC
  Administered 2016-06-12: 2 m[IU]/min via INTRAVENOUS
  Filled 2016-06-12: qty 1000

## 2016-06-12 MED ORDER — OXYCODONE-ACETAMINOPHEN 5-325 MG PO TABS: 2.0000 | ORAL_TABLET | ORAL | Status: DC | PRN

## 2016-06-12 MED ORDER — TERBUTALINE SULFATE 1 MG/ML IJ SOLN
0.2500 mg | Freq: Once | INTRAMUSCULAR | Status: DC | PRN
  Filled 2016-06-12: qty 1

## 2016-06-12 MED ORDER — LIDOCAINE HCL (PF) 1 % IJ SOLN
INTRAMUSCULAR | Status: DC | PRN
  Administered 2016-06-12: 4 mL via EPIDURAL

## 2016-06-12 MED ORDER — LACTATED RINGERS IV SOLN
500.0000 mL | INTRAVENOUS | Status: DC | PRN
  Administered 2016-06-13: 1000 mL via INTRAVENOUS
  Administered 2016-06-13: 500 mL via INTRAVENOUS

## 2016-06-12 MED ORDER — LIDOCAINE HCL (PF) 1 % IJ SOLN
30.0000 mL | INTRAMUSCULAR | Status: DC | PRN
  Filled 2016-06-12: qty 30

## 2016-06-12 MED ORDER — FENTANYL 2.5 MCG/ML BUPIVACAINE 1/10 % EPIDURAL INFUSION (WH - ANES)
14.0000 mL/h | INTRAMUSCULAR | Status: DC | PRN
  Administered 2016-06-12 – 2016-06-13 (×2): 14 mL/h via EPIDURAL
  Filled 2016-06-12 (×2): qty 100

## 2016-06-12 MED ORDER — SOD CITRATE-CITRIC ACID 500-334 MG/5ML PO SOLN
30.0000 mL | ORAL | Status: DC | PRN
  Administered 2016-06-12: 30 mL via ORAL
  Filled 2016-06-12: qty 15

## 2016-06-12 MED ORDER — DIPHENHYDRAMINE HCL 50 MG/ML IJ SOLN: 12.5000 mg | INTRAMUSCULAR | Status: DC | PRN

## 2016-06-12 NOTE — MAU Note (Signed)
Pt states that within the last three hours contractions have gotten to 7 minutes apart.  Pt states she is feeling the baby move.  Pt denies water breaking.

## 2016-06-12 NOTE — Progress Notes (Signed)
Provider was notified of cervical exam results, maternal vital sign,s and of pt's report of ultrasound results from last week and that she has a repeat ultrasound scheduled for tomorrow.  Notified pt reports contractions every 7 minutes but they are tracing well on the monitor but palpate about every 7 minutes as reported.  Provider states that when FHR tracing is reactive pt can be discharged home with labor precautions.

## 2016-06-12 NOTE — Anesthesia Preprocedure Evaluation (Signed)
Anesthesia Evaluation  Patient identified by MRN, date of birth, ID band Patient awake    Reviewed: Allergy & Precautions, Patient's Chart, lab work & pertinent test results  Airway Mallampati: III       Dental  (+) Teeth Intact   Pulmonary    breath sounds clear to auscultation       Cardiovascular negative cardio ROS   Rhythm:Regular Rate:Normal     Neuro/Psych PSYCHIATRIC DISORDERS    GI/Hepatic negative GI ROS, Neg liver ROS,   Endo/Other  negative endocrine ROS  Renal/GU negative Renal ROS  negative genitourinary   Musculoskeletal negative musculoskeletal ROS (+)   Abdominal   Peds negative pediatric ROS (+)  Hematology negative hematology ROS (+)   Anesthesia Other Findings   Reproductive/Obstetrics (+) Pregnancy                             Lab Results  Component Value Date   WBC 12.8 (H) 06/12/2016   HGB 10.2 (L) 06/12/2016   HCT 30.7 (L) 06/12/2016   MCV 73.6 (L) 06/12/2016   PLT 408 (H) 06/12/2016     Anesthesia Physical Anesthesia Plan  ASA: III  Anesthesia Plan: Epidural   Post-op Pain Management:    Induction:   Airway Management Planned:   Additional Equipment:   Intra-op Plan:   Post-operative Plan:   Informed Consent: I have reviewed the patients History and Physical, chart, labs and discussed the procedure including the risks, benefits and alternatives for the proposed anesthesia with the patient or authorized representative who has indicated his/her understanding and acceptance.     Plan Discussed with:   Anesthesia Plan Comments:         Anesthesia Quick Evaluation

## 2016-06-12 NOTE — Anesthesia Procedure Notes (Signed)
Epidural Patient location during procedure: OB Start time: 06/12/2016 11:35 PM End time: 06/12/2016 11:45 PM  Staffing Anesthesiologist: Suella Broad D Performed: anesthesiologist   Preanesthetic Checklist Completed: patient identified, site marked, surgical consent, pre-op evaluation, timeout performed, IV checked, risks and benefits discussed and monitors and equipment checked  Epidural Patient position: sitting Prep: ChloraPrep Patient monitoring: heart rate, continuous pulse ox and blood pressure Approach: midline Location: L3-L4 Injection technique: LOR saline  Needle:  Needle type: Tuohy  Needle gauge: 17 G Needle length: 9 cm Catheter type: closed end flexible Catheter size: 20 Guage Test dose: negative and 1.5% lidocaine  Assessment Events: blood not aspirated, injection not painful, no injection resistance and no paresthesia  Additional Notes LOR @ 8  Patient identified. Risks/Benefits/Options discussed with patient including but not limited to bleeding, infection, nerve damage, paralysis, failed block, incomplete pain control, headache, blood pressure changes, nausea, vomiting, reactions to medications, itching and postpartum back pain. Confirmed with bedside nurse the patient's most recent platelet count. Confirmed with patient that they are not currently taking any anticoagulation, have any bleeding history or any family history of bleeding disorders. Patient expressed understanding and wished to proceed. All questions were answered. Sterile technique was used throughout the entire procedure. Please see nursing notes for vital signs. Test dose was given through epidural catheter and negative prior to continuing to dose epidural or start infusion. Warning signs of high block given to the patient including shortness of breath, tingling/numbness in hands, complete motor block, or any concerning symptoms with instructions to call for help. Patient was given instructions on  fall risk and not to get out of bed. All questions and concerns addressed with instructions to call with any issues or inadequate analgesia.    Reason for block:procedure for pain

## 2016-06-12 NOTE — Progress Notes (Signed)
S: Patient seen & examined for progress of labor. Patient feeling contractions. No epidural at this time, but does desire one when appropriate.     O:  Vitals:   06/12/16 1837 06/12/16 1850 06/12/16 1914 06/12/16 2011  BP:  (!) 129/94 136/87 137/81  Pulse:  86 90 92  Resp:  18 17 18   Temp: 98.7 F (37.1 C)     TempSrc: Oral     SpO2:      Weight:      Height:        Dilation: 2 Effacement (%): 80 Station: -2 Presentation: Vertex Exam by:: Daiva Nakayama, RN   FHT: 140s bpm, mod var, +accels, no decels TOCO: q3 min   A/P: No epidural at this time, plans for one when appropriate Cytotec placed around 19:00 Pitocin started Continue expectant management Anticipate SVD   Crissie Sickles, MD PGY-2 06/12/2016 8:48 PM

## 2016-06-12 NOTE — H&P (Signed)
Melanie Cisneros is a 26 y.o. female G1  @ 40.2 wks presenting for contractions that have been going on all day.Denies ROM or vag bleeding. GBS neg OB History    Gravida Para Term Preterm AB Living   1         0   SAB TAB Ectopic Multiple Live Births                 Past Medical History:  Diagnosis Date  . Bacterial vaginosis   . Chlamydia   . Gonorrhea   . Obesity   . UTI (lower urinary tract infection)    Past Surgical History:  Procedure Laterality Date  . WISDOM TOOTH EXTRACTION     Family History: family history includes Hypertension in her mother. Social History:  reports that she has never smoked. She has never used smokeless tobacco. She reports that she does not drink alcohol or use drugs.     Maternal Diabetes: No Genetic Screening: Normal Maternal Ultrasounds/Referrals: Normal Fetal Ultrasounds or other Referrals:  None Maternal Substance Abuse:  Yes:  Type: Marijuana Significant Maternal Medications:  None Significant Maternal Lab Results:  None Other Comments:  None  ROS Maternal Medical History:  Reason for admission: Contractions.   Contractions: Onset was 6-12 hours ago.   Frequency: regular.   Perceived severity is mild.    Fetal activity: Perceived fetal activity is normal.   Last perceived fetal movement was within the past hour.    Prenatal complications: no prenatal complications Prenatal Complications - Diabetes: none.    Dilation: 2.5 Effacement (%): 50 Station: -2 Exam by:: Doug Sou, RN  Blood pressure 123/79, pulse 89, temperature 99.1 F (37.3 C), temperature source Oral, resp. rate 18, last menstrual period 08/23/2015, SpO2 96 %. Maternal Exam:  Uterine Assessment: Contraction strength is moderate.  Contraction frequency is regular.   Abdomen: Patient reports no abdominal tenderness. Fetal presentation: vertex  Introitus: Normal vulva. Normal vagina.  Ferning test: not done.  Nitrazine test: not done. Amniotic fluid  character: not assessed.  Pelvis: adequate for delivery.   Cervix: Cervix evaluated by digital exam.     Fetal Exam Fetal Monitor Review: Mode: ultrasound.   Variability: moderate (6-25 bpm).   Pattern: accelerations present and variable decelerations.    Fetal State Assessment: Category I - tracings are normal.     Physical Exam  Constitutional: She is oriented to person, place, and time. She appears well-developed and well-nourished.  HENT:  Head: Normocephalic.  Neck: Normal range of motion.  Cardiovascular: Normal rate, regular rhythm and normal heart sounds.   Respiratory: Effort normal and breath sounds normal.  GI: Soft. Bowel sounds are normal.  Genitourinary: Vagina normal and uterus normal.  Musculoskeletal: Normal range of motion.  Neurological: She is alert and oriented to person, place, and time. She has normal reflexes.  Skin: Skin is warm and dry.  Psychiatric: She has a normal mood and affect. Her behavior is normal. Judgment and thought content normal.    Prenatal labs: ABO, Rh: B/POS/-- (07/07 1038) Antibody: NEG (07/07 1038) Rubella: <0.90 (07/07 1038) RPR: NON REAC (07/07 1038)  HBsAg: NEGATIVE (07/07 1038)  HIV: NONREACTIVE (07/07 1038)  GBS:     Assessment/Plan: Preg @ 40.3, prodromal labor with occasional mild cariable decels. POC discussed with Dr. Harolyn Rutherford.Pt to be admitted and labor augmented. SVE 2/70/-2, GBS neg  Koren Shiver 06/12/2016, 5:49 PM

## 2016-06-13 ENCOUNTER — Encounter: Payer: Self-pay | Admitting: Family Medicine

## 2016-06-13 ENCOUNTER — Encounter (HOSPITAL_COMMUNITY): Payer: Self-pay

## 2016-06-13 ENCOUNTER — Encounter (HOSPITAL_COMMUNITY): Payer: Self-pay | Admitting: *Deleted

## 2016-06-13 ENCOUNTER — Ambulatory Visit (HOSPITAL_COMMUNITY): Payer: Medicaid Other

## 2016-06-13 DIAGNOSIS — Z3A40 40 weeks gestation of pregnancy: Secondary | ICD-10-CM

## 2016-06-13 LAB — COMPREHENSIVE METABOLIC PANEL
ALBUMIN: 2.7 g/dL — AB (ref 3.5–5.0)
ALT: 13 U/L — ABNORMAL LOW (ref 14–54)
ANION GAP: 9 (ref 5–15)
AST: 27 U/L (ref 15–41)
Alkaline Phosphatase: 166 U/L — ABNORMAL HIGH (ref 38–126)
BUN: 10 mg/dL (ref 6–20)
CHLORIDE: 108 mmol/L (ref 101–111)
CO2: 20 mmol/L — ABNORMAL LOW (ref 22–32)
Calcium: 8.8 mg/dL — ABNORMAL LOW (ref 8.9–10.3)
Creatinine, Ser: 0.79 mg/dL (ref 0.44–1.00)
GFR calc Af Amer: >60 mL/min (ref >60)
Glucose, Bld: 118 mg/dL — ABNORMAL HIGH (ref 65–99)
POTASSIUM: 3.8 mmol/L (ref 3.5–5.1)
Sodium: 137 mmol/L (ref 135–145)
Total Bilirubin: 0.4 mg/dL (ref 0.3–1.2)
Total Protein: 6.2 g/dL — ABNORMAL LOW (ref 6.5–8.1)

## 2016-06-13 LAB — PROTEIN / CREATININE RATIO, URINE
CREATININE, URINE: 78 mg/dL
PROTEIN CREATININE RATIO: 0.94 mg/mg{creat} — AB (ref 0.00–0.15)
Total Protein, Urine: 73 mg/dL

## 2016-06-13 LAB — RPR: RPR Ser Ql: NONREACTIVE

## 2016-06-13 MED ORDER — DIBUCAINE 1 % RE OINT: 1.0000 "application " | TOPICAL_OINTMENT | RECTAL | Status: DC | PRN

## 2016-06-13 MED ORDER — SIMETHICONE 80 MG PO CHEW: 80.0000 mg | CHEWABLE_TABLET | ORAL | Status: DC | PRN

## 2016-06-13 MED ORDER — COCONUT OIL OIL
1.0000 "application " | TOPICAL_OIL | Status: DC | PRN
  Administered 2016-06-14: 1 via TOPICAL
  Filled 2016-06-13: qty 120

## 2016-06-13 MED ORDER — ONDANSETRON HCL 4 MG PO TABS: 4.0000 mg | ORAL_TABLET | ORAL | Status: DC | PRN

## 2016-06-13 MED ORDER — WITCH HAZEL-GLYCERIN EX PADS: 1.0000 "application " | MEDICATED_PAD | CUTANEOUS | Status: DC | PRN

## 2016-06-13 MED ORDER — DIPHENHYDRAMINE HCL 25 MG PO CAPS: 25.0000 mg | ORAL_CAPSULE | Freq: Four times a day (QID) | ORAL | Status: DC | PRN

## 2016-06-13 MED ORDER — TETANUS-DIPHTH-ACELL PERTUSSIS 5-2.5-18.5 LF-MCG/0.5 IM SUSP: 0.5000 mL | Freq: Once | INTRAMUSCULAR | Status: DC

## 2016-06-13 MED ORDER — PRENATAL MULTIVITAMIN CH
1.0000 | ORAL_TABLET | Freq: Every day | ORAL | Status: DC
  Administered 2016-06-14 – 2016-06-15 (×2): 1 via ORAL
  Filled 2016-06-13 (×2): qty 1

## 2016-06-13 MED ORDER — ONDANSETRON HCL 4 MG/2ML IJ SOLN: 4.0000 mg | INTRAMUSCULAR | Status: DC | PRN

## 2016-06-13 MED ORDER — IBUPROFEN 600 MG PO TABS
600.0000 mg | ORAL_TABLET | Freq: Four times a day (QID) | ORAL | Status: DC
  Administered 2016-06-13 – 2016-06-15 (×6): 600 mg via ORAL
  Filled 2016-06-13 (×8): qty 1

## 2016-06-13 MED ORDER — ACETAMINOPHEN 325 MG PO TABS: 650.0000 mg | ORAL_TABLET | ORAL | Status: DC | PRN

## 2016-06-13 MED ORDER — BENZOCAINE-MENTHOL 20-0.5 % EX AERO
1.0000 "application " | INHALATION_SPRAY | CUTANEOUS | Status: DC | PRN
  Administered 2016-06-13: 1 via TOPICAL
  Filled 2016-06-13: qty 56

## 2016-06-13 MED ORDER — SENNOSIDES-DOCUSATE SODIUM 8.6-50 MG PO TABS
2.0000 | ORAL_TABLET | ORAL | Status: DC
  Administered 2016-06-14 – 2016-06-15 (×2): 2 via ORAL
  Filled 2016-06-13 (×2): qty 2

## 2016-06-13 MED ORDER — ZOLPIDEM TARTRATE 5 MG PO TABS: 5.0000 mg | ORAL_TABLET | Freq: Every evening | ORAL | Status: DC | PRN

## 2016-06-13 NOTE — Progress Notes (Signed)
Initial visit with Melanie Cisneros to introduce spiritual care services and offer education about advance directives.  She does not desire to complete them at this time.  Educational materials left with patient for further review.  Pt is aware of how to reach chaplain if she desires to complete during her stay.

## 2016-06-13 NOTE — Progress Notes (Signed)
Melanie Cisneros resident called about pt heart rate and B/P elevations. Orders received.

## 2016-06-13 NOTE — Anesthesia Pain Management Evaluation Note (Signed)
  [facility]NA Pain Management Visit Note  Patient: Melanie Cisneros, 26 y.o., female  "Hello I am a member of the anesthesia team at [facility]. We have an anesthesia team available at all times to provide care throughout the hospital, including epidural management and anesthesia for C-section. I don't know your plan for the delivery whether it a natural birth, water birth, IV sedation, nitrous supplementation, doula or epidural, but we want to meet your pain goals."   1.Was your pain managed to your expectations on prior hospitalizations?   No prior hospitalizations  2.What is your expectation for pain management during this hospitalization?     Epidural  3.How can we help you reach that goal? Maintain epidural  Record the patient's initial score and the patient's pain goal.   Pain: 9, but now it is 0  Pain Goal: 1 [facility] wants you to be able to say your pain was always managed very well.  Gianno Volner 06/13/2016

## 2016-06-13 NOTE — Lactation Note (Signed)
This note was copied from a baby's chart. Lactation Consultation Note  Patient Name: Melanie Cisneros [id number] Date: 06/13/2016 Reason for consult: Initial assessment  Visited with first time Mom, baby 66 hrs old.  Mom with large, heavy breasts, and short shafted large diameter nipples.  Basics of BFing reviewed.  Baby in bed showing subtle cues.  Offered to assist with a latch.  Hand expression demonstrated, colostrum easy to express.  Baby placed in football position, and baby latched easily.  Identified a nutritive suck/swallow pattern for Mom.  Encouraged her to call prn for help.  Goal is 8-12 feedings per 24 hrs, and STS as much as possible. Brochure left in room.  Informed Mom of IP and OP lactation services available to her.  Lactation to follow up in am, but to call for help prn.   Consult Status Consult Status: Follow-up Date: 06/13/16 Follow-up type: In-patient    Broadus John 06/13/2016, 6:16 PM

## 2016-06-13 NOTE — Anesthesia Postprocedure Evaluation (Signed)
Anesthesia Post Note  Patient: Melanie Cisneros  Procedure(s) Performed: * No procedures listed *  Patient location during evaluation: Mother Baby Anesthesia Type: Epidural Level of consciousness: awake and alert Pain management: satisfactory to patient Vital Signs Assessment: post-procedure vital signs reviewed and stable Respiratory status: respiratory function stable Cardiovascular status: stable Postop Assessment: no headache, no backache, epidural receding, patient able to bend at knees, no signs of nausea or vomiting and adequate PO intake Anesthetic complications: no        Last Vitals:  Vitals:   06/13/16 1637 06/13/16 2025  BP: 138/63 (!) 157/96  Pulse: 100 (!) 112  Resp: 16 20  Temp: 37.2 C 37 C    Last Pain:  Vitals:   06/13/16 2025  TempSrc: Oral  PainSc:    Pain Goal:                 Leianne Callins

## 2016-06-13 NOTE — Progress Notes (Signed)
S: Patient seen & examined for progress of labor. Patient comfortable with epidural. Following epidural patient with FHT concerning for recurrent late decelerations. Received 500cc fluid bolus, phenylephrine and repositioning. Decelerations continued. Pitocin was turned off. Patient currently with peanut, and reassuring FHT.     O:  Vitals:   06/12/16 2236 06/12/16 2342 06/13/16 0000 06/13/16 0201  BP: 127/90     Pulse: 96     Resp: 20 20 20 18   Temp:   98.4 F (36.9 C)   TempSrc:   Oral   SpO2:      Weight:      Height:        Dilation: 5.5 Effacement (%): 80, 90 Cervical Position: Posterior Station: -2 Presentation: Vertex Exam by:: [place], RN   FHT: 140 bpm, mod var, +accels, no decels TOCO: q5-62min   A/P: Pitocin currently off secondary to fetal intolerance Epidural in place Continue expectant management Monitor FHT closely for recurrent variable decelerations Anticipate SVD at this time. Will consider c-section if persistent, recurrent decelerations return   Crissie Sickles, MD PGY-2 06/13/2016 2:18 AM

## 2016-06-13 NOTE — Progress Notes (Signed)
Patient ID: Melanie Cisneros, female   DOB: May 30, 1990, 26 y.o.   MRN: [id number] Doing well, comfortable  Vitals:   06/13/16 0801 06/13/16 0831 06/13/16 0901 06/13/16 0931  BP: 134/78 128/79 127/80 122/73  Pulse: (!) 110 99 89 99  Resp: 18 18 18 18   Temp: 98.4 F (36.9 C)     TempSrc: Oral     SpO2:      Weight:      Height:       FHR reactive Category I UCs every 2 minutes  Dilation: 6 Effacement (%): 90 Cervical Position: Posterior Station: -1 Presentation: Vertex Exam by:: Evonnie Dawes, rn  AROM clear fluid IUPC inserted to ascertain strength of contractions

## 2016-06-14 ENCOUNTER — Encounter: Payer: Self-pay | Admitting: Obstetrics and Gynecology

## 2016-06-14 LAB — CBC WITH DIFFERENTIAL/PLATELET
BAND NEUTROPHILS: 0 %
BASOS PCT: 0 %
Basophils Absolute: 0 10*3/uL (ref 0.0–0.1)
Blasts: 0 %
EOS ABS: 0 10*3/uL (ref 0.0–0.7)
Eosinophils Relative: 0 %
HCT: 27.2 % — ABNORMAL LOW (ref 36.0–46.0)
Hemoglobin: 9.4 g/dL — ABNORMAL LOW (ref 12.0–15.0)
LYMPHS ABS: 4.3 10*3/uL — AB (ref 0.7–4.0)
Lymphocytes Relative: 19 %
MCH: 25.5 pg — ABNORMAL LOW (ref 26.0–34.0)
MCHC: 34.6 g/dL (ref 30.0–36.0)
MCV: 73.7 fL — AB (ref 78.0–100.0)
MYELOCYTES: 0 %
Metamyelocytes Relative: 0 %
Monocytes Absolute: 1.4 10*3/uL — ABNORMAL HIGH (ref 0.1–1.0)
Monocytes Relative: 6 %
Neutro Abs: 17 10*3/uL — ABNORMAL HIGH (ref 1.7–7.7)
Neutrophils Relative %: 75 %
Other: 0 %
PLATELETS: 355 10*3/uL (ref 150–400)
PROMYELOCYTES ABS: 0 %
RBC: 3.69 MIL/uL — ABNORMAL LOW (ref 3.87–5.11)
RDW: 15.6 % — AB (ref 11.5–15.5)
WBC: 22.7 10*3/uL — ABNORMAL HIGH (ref 4.0–10.5)
nRBC: 0 /100 WBC

## 2016-06-14 MED ORDER — MEASLES, MUMPS & RUBELLA VAC ~~LOC~~ INJ
0.5000 mL | INJECTION | Freq: Once | SUBCUTANEOUS | Status: AC
  Administered 2016-06-15: 0.5 mL via SUBCUTANEOUS
  Filled 2016-06-14 (×2): qty 0.5

## 2016-06-14 NOTE — Progress Notes (Signed)
UR chart review completed.  

## 2016-06-14 NOTE — Progress Notes (Addendum)
POSTPARTUM PROGRESS NOTE  Post Partum Day #1  Subjective:  Melanie Cisneros is a 26 y.o. G1P1001 [redacted]w[redacted]d s/p vadum assisted vaginal delivery after presenting with contractions and recurrent variables on fetal monitoring.    Patient with BP elevated to the 150s/90s overnight. She did not have any associated severe features. CBC and CMP were reassuring without evidence of pre-eclampsia. Protein: Creatinine ratio was elevated, but was a contaminated sample with a high amount of lochia.  Pt denies problems with ambulating, voiding or po intake.  She denies nausea or vomiting.  Pain is well controlled.  She has had flatus. She has not had bowel movement.  Lochia Small.   Objective: Blood pressure 110/70, pulse 95, temperature 97.8 F (36.6 C), temperature source Oral, resp. rate 18, height 5\' 5"  (1.651 m), weight 283 lb (128.4 kg), last menstrual period 08/23/2015, SpO2 96 %, unknown if currently breastfeeding.  Physical Exam:  General: alert, cooperative and no distress Lochia:normal flow Chest: CTAB Heart: RRR no m/r/g Abdomen: +BS, soft, nontender,  Uterine Fundus: firm, palpable just inferior to the umbilicus DVT Evaluation: No calf swelling or tenderness Extremities: no lower extremity edema   Recent Labs  06/12/16 1840 06/13/16 2223  HGB 10.2* 9.4*  HCT 30.7* 27.2*    Assessment/Plan:  ASSESSMENT: Melanie Cisneros is a 26 y.o. G1P1001 [redacted]w[redacted]d s/p VAVD after presenting with contractions and recurrent variable decelerations.   Will continue to watch blood pressures and determine if a long term anti-hypertensive medication is warranted.   Plan for discharge tomorrow   LOS: 2 days   Crissie Sickles, MD PGY-2 06/14/2016, 9:02 AM    OB FELLOW POSTPARTUM PROGRESS NOTE ATTESTATION  I have seen and examined this patient and agree with above documentation in the resident's note.   Jacquiline Doe, MD 9:06 AM

## 2016-06-14 NOTE — Progress Notes (Signed)
Was notified of patient having mild tachycardia to the low 100s and elevated blood pressure with most recent 157/96. CBC, CMP, and urine protein:Cr ratio obtained. CBC and CMP without thrombocytopenia or transaminitis. Protein:Cr ratio elevated at 0.9, but concern that sample may have had postpartum lochia. Plan to continue to monitor blood pressures closely. If remain elevated consider repeat protein: Cr ratio, anti-hypertensives and magensium sulfate if appropriate. Patient without severe features and asymptomatic at this time.

## 2016-06-14 NOTE — Lactation Note (Signed)
This note was copied from a baby's chart. Lactation Consultation Note  Patient Name: Melanie Cisneros [id number] Date: 06/14/2016 Reason for consult: Follow-up assessment Baby at 32 hr of life. Mom started using DEBP today. She plans to continue latching baby through the night and offering expressed milk. She is not sure if she wants to continue with bf. At this point she feels like pumping is better than latching. She asked how to get formula in the hospital and when she leaves. She is a [facility] client, encouraged her to call them tomorrow. Reviewed pump rental options. Discussed the benefits of continued bf. She is currently frustrated because baby will not sleep in the basinet. Discussed baby behavior, feeding frequency, baby belly size, voids, wt loss, breast changes, and nipple care. She is aware of lactation services and support group.    Maternal Data    Feeding Feeding Type: Breast Fed Length of feed: 10 min  LATCH Score/Interventions Latch: Grasps breast easily, tongue down, lips flanged, rhythmical sucking.  Audible Swallowing: Spontaneous and intermittent  Type of Nipple: Everted at rest and after stimulation  Comfort (Breast/Nipple): Filling, red/small blisters or bruises, mild/mod discomfort     Hold (Positioning): No assistance needed to correctly position infant at breast.  LATCH Score: 9  Lactation Tools Discussed/Used     Consult Status Consult Status: Follow-up Date: 06/15/16 Follow-up type: In-patient    Denzil Hughes 06/14/2016, 9:39 PM

## 2016-06-15 ENCOUNTER — Telehealth (HOSPITAL_COMMUNITY): Payer: Self-pay

## 2016-06-15 MED ORDER — IBUPROFEN 600 MG PO TABS: 600.0000 mg | ORAL_TABLET | Freq: Four times a day (QID) | ORAL | 0 refills | Status: DC

## 2016-06-15 NOTE — Lactation Note (Signed)
This note was copied from a baby's chart. Lactation Consultation Note: Mother is breastfeeding and supplementing after feeding. Mother states that infant is latching well. She prefers football hold. She denies having any discomfort with latching. She reports that she hears infant swallow.  She has pumped 55ml of ebm  and has it at the bedside to give to infant.  Mother is active with [facility]. She plans to phone [facility] today when she gets home. Mother was offered a [facility] loaner pump. She declined at this time. Mother was given a harmony hand pump with a #27 flange. Advised mother to breastfeed infant well at least 8-12 times in 24 hours. Encouraged mother to cue base feed infant.   Discussed treatment for engorgement . Advised mother to use good massage and ice for 15 mins every 3-4 hours. Mother was given ice packs. Mother denies having any question. She was informed of Lactation services, BFSG,S, out patient services and phone number for Lactation.   Patient Name: Melanie Cisneros Today's Date: 06/15/2016     Maternal Data    Feeding Feeding Type: Breast Fed  LATCH Score/Interventions Latch: Grasps breast easily, tongue down, lips flanged, rhythmical sucking.  Audible Swallowing: Spontaneous and intermittent  Type of Nipple: Everted at rest and after stimulation  Comfort (Breast/Nipple): Filling, red/small blisters or bruises, mild/mod discomfort     Hold (Positioning): No assistance needed to correctly position infant at breast.  LATCH Score: 9  Lactation Tools Discussed/Used     Consult Status      Darla Lesches 06/15/2016, 9:54 AM

## 2016-06-15 NOTE — Clinical Social Work Maternal (Signed)
CLINICAL SOCIAL WORK MATERNAL/CHILD NOTE  Patient Details  Name: Melanie Cisneros MRN: [id number] Date of Birth: 06/28/1990  Date:  06/15/2016  Clinical Social Worker Initiating Note:  Nicklous Aburto Boyd-Gilyard Date/ Time Initiated:  06/14/16/0953     Child's Name:  Salwa Stankovich   Legal Guardian:  Mother (FOB is Devonta Neal; MOb and FOB recently had a disagreement and MOB communicated that FOB is not welcomed to visit hospital. )   Need for Interpreter:  None   Date of Referral:  06/15/16     Reason for Referral:  Current Substance Use/Substance Use During Pregnancy    Referral Source:  Central Nursery   Address:  [address] [place] [address]  Phone number:  [phone]   Household Members:  Parents, Minor Children   Natural Supports (not living in the home):  Extended Family, Immediate Family   Professional Supports: None   Employment: Unemployed   Type of Work:     Education:  9 to 11 years   Financial Resources:  Medicaid   Other Resources:  WIC   Cultural/Religious Considerations Which May Impact Care:  Per MOB's Face Sheet, MOB is [employer].   Strengths:  Ability to meet basic needs , Home prepared for child    Risk Factors/Current Problems:  Substance Use    Cognitive State:  Alert , Able to Concentrate , Linear Thinking , Insightful    Mood/Affect:  Interested , Relaxed , Irritable    CSW Assessment: CSW met with MOB to complete an assessment for  hx of substance use.  MOB was bonding with infant when CSW arrived; as evident by MOB engaging in skin to skin.  MOB was polite, inviting, and appropriate with infant during the assessment. CSW inquired about MOB's substance use and MOB denied substance during pregnancy. CSW informed MOB that MOB had a positive drug screen on 11/20/2015 for THC. MOB communicated feelings of frustration and reported that MOB was not aware of any positive screens prenatally. MOB acknowledged the use  of marijuana after CSW disclosed MOB's positive screen.  MOB stated that MOB has not use any substance since July 2017.  CSW made MOB aware of the hospital's policy and procedures regarding substance use.  MOB was understanding and was not concerned.  CSW made MOB aware of the 2 screenings for the infant. CSW will monitor the infant's UDS and Cord and will make a report to [place] CPS if needed. MOB was offered resources for SA treatment and MOB declined. CSW made MOB aware that if infant's UDS and CDS is negative, CSW will make a referral to CPS.  CSW explained to MOB the difference between a report and a referral; MOB understood.  CSW educated MOB about PPD. CSW informed MOB of possible supports and interventions to decrease PPD.  CSW also encouraged MOB to seek medical attention if needed for increased signs and symptoms for PPD.  CSW also provided MOB with SIDS education and MOB responded and asked appropriate questions.  CSW inquired about the support of FOB and MOB communicated that FOB will be supportive.  However, at this time MOB and FOB are not communicating with one another due to a recent disagreement.  MOB reported that FOB is not aware that MOB has given birth, and MOB is not ready to share that information with FOB.  MOB stated that MOB will contact FOB after d/c.  CSW assessed for safety and DV hx.  MOB denied DV and   declined resources for outpatient counseling.   CSW provided MOB with CSW contact information and encouraged MOB to contact CSW if MOB had any additional questions or concerns. CSW thanked MOB for meeting with CSW.   CSW Plan/Description:  No Further Intervention Required/No Barriers to Discharge, Information/Referral to Community Resources , Patient/Family Education  (CSW will monitor infant's CDS and will make a report if warranted. )    Obdulia Steier D BOYD-GILYARD, LCSW 06/15/2016, 10:03 AM  

## 2016-06-15 NOTE — Telephone Encounter (Signed)
Mom called with concerns that she fed her baby then she threw up and 2 bm's and then was crying. Mom wanted to see if it was ok to feed her baby.  Lc called back and advised as normal and ok to feed baby.  Mom reports that she did before phone call was returned and baby is doing well.

## 2016-06-15 NOTE — Discharge Instructions (Signed)

## 2016-06-15 NOTE — Discharge Summary (Signed)
OB Discharge Summary     Patient Name: Melanie Cisneros DOB: May 14, 1991 MRN: [id number]  Date of admission: 06/12/2016 Delivering MD: Woodroe Mode   Date of discharge: 06/15/2016  Admitting diagnosis: 2 WKS, CTXS Intrauterine pregnancy: [redacted]w[redacted]d     Secondary diagnosis:  Principal Problem:   SVD (spontaneous vaginal delivery) Active Problems:   Indication for care in labor or delivery   Second-degree perineal laceration, with delivery  Additional problems:  Variable decelerations upon presentation Elevated blood pressure     Discharge diagnosis: Term Pregnancy Delivered                                                                                                Post partum procedures:None  Augmentation: AROM and Pitocin  Complications: None  Hospital course:  Onset of Labor With Vaginal Delivery     26 y.o. yo G1P1001 at [redacted]w[redacted]d was admitted in Latent Labor on 06/12/2016. Patient had an uncomplicated labor course as follows:  Membrane Rupture Time/Date: 10:01 AM ,06/13/2016   Intrapartum Procedures: Episiotomy: None [1]                                         Lacerations:  2nd degree [3];Perineal [11]  Patient had a delivery of a Viable infant. 06/13/2016  Information for the patient's newborn:  Dejanai, Schriver [id number]  Delivery Method: Vaginal, Vacuum (Extractor) (Filed from Delivery Summary)    Pateint had an uncomplicated postpartum course.  She is ambulating, tolerating a regular diet, passing flatus, and urinating well. Patient is discharged home in stable condition on 06/15/16.   Physical exam  Vitals:   06/14/16 0030 06/14/16 0430 06/14/16 1800 06/15/16 0556  BP: 127/74 110/70 132/81 121/71  Pulse: (!) 102 95 (!) 101 95  Resp:  18 18 18   Temp:  97.8 F (36.6 C) 98.2 F (36.8 C) 98 F (36.7 C)  TempSrc:  Oral Oral Oral  SpO2:      Weight:      Height:       General: alert, cooperative and no distress Lochia: appropriate Uterine Fundus:  firm Incision: Healing well with no significant drainage, No significant erythema, Dressing is clean, dry, and intact DVT Evaluation: No evidence of DVT seen on physical exam. No significant calf/ankle edema. Labs: Lab Results  Component Value Date   WBC 22.7 (H) 06/13/2016   HGB 9.4 (L) 06/13/2016   HCT 27.2 (L) 06/13/2016   MCV 73.7 (L) 06/13/2016   PLT 355 06/13/2016   CMP Latest Ref Rng & Units 06/13/2016  Glucose 65 - 99 mg/dL 118(H)  BUN 6 - 20 mg/dL 10  Creatinine 0.44 - 1.00 mg/dL 0.79  Sodium 135 - 145 mmol/L 137  Potassium 3.5 - 5.1 mmol/L 3.8  Chloride 101 - 111 mmol/L 108  CO2 22 - 32 mmol/L 20(L)  Calcium 8.9 - 10.3 mg/dL 8.8(L)  Total Protein 6.5 - 8.1 g/dL 6.2(L)  Total Bilirubin 0.3 - 1.2 mg/dL 0.4  Alkaline Phos 38 -  126 U/L 166(H)  AST 15 - 41 U/L 27  ALT 14 - 54 U/L 13(L)    Discharge instruction: per After Visit Summary and "Baby and Me Booklet".  After visit meds:  Allergies as of 06/15/2016      Reactions   Amoxicillin Anaphylaxis, Hives, Other (See Comments)   Has patient had a PCN reaction causing immediate rash, facial/tongue/throat swelling, SOB or lightheadedness with hypotension: Yes Has patient had a PCN reaction causing severe rash involving mucus membranes or skin necrosis: No Has patient had a PCN reaction that required hospitalization No Has patient had a PCN reaction occurring within the last 10 years: No If all of the above answers are "NO", then may proceed with Cephalosporin use.   Penicillins Anaphylaxis, Hives, Other (See Comments)   Has patient had a PCN reaction causing immediate rash, facial/tongue/throat swelling, SOB or lightheadedness with hypotension: Yes Has patient had a PCN reaction causing severe rash involving mucus membranes or skin necrosis: No Has patient had a PCN reaction that required hospitalization No Has patient had a PCN reaction occurring within the last 10 years: No If all of the above answers are "NO", then may  proceed with Cephalosporin use.      Medication List    TAKE these medications   calcium carbonate 750 MG chewable tablet Commonly known as:  TUMS EX Chew 1 tablet by mouth as needed for heartburn.   docusate sodium 100 MG capsule Commonly known as:  COLACE Take 1 capsule (100 mg total) by mouth 2 (two) times daily as needed for mild constipation.   hydrOXYzine 50 MG capsule Commonly known as:  VISTARIL Take 50 mg by mouth 3 (three) times daily.   ibuprofen 600 MG tablet Commonly known as:  ADVIL,MOTRIN Take 1 tablet (600 mg total) by mouth every 6 (six) hours.   ondansetron 8 MG disintegrating tablet Commonly known as:  ZOFRAN-ODT Take 1 tablet (8 mg total) by mouth every 8 (eight) hours as needed for nausea or vomiting.   PRENATAL GUMMIES/DHA & FA 0.4-32.5 MG Chew Chew 2 each by mouth daily.       Diet: routine diet  Activity: Advance as tolerated. Pelvic rest for 6 weeks.   Outpatient follow up:6 weeks Follow up Appt:No future appointments. Follow up Visit:No Follow-up on file.  Postpartum contraception: Considering OCPs  Newborn Data: Live born female  Birth Weight: 8 lb 10.5 oz (3925 g) APGAR: 8, 9  Baby Feeding: Breast Disposition:home with mother   06/15/2016 Crissie Sickles, MD Patient was seen and examined by me also Agree with note Vitals stable Labs stable Fundus firm, lochia within normal limits Perineum healing Ext WNL  Ready for discharge  Seabron Spates, CNM

## 2016-07-14 ENCOUNTER — Ambulatory Visit: Payer: Self-pay | Admitting: Obstetrics and Gynecology

## 2016-07-25 ENCOUNTER — Encounter (HOSPITAL_COMMUNITY): Payer: Self-pay | Admitting: Emergency Medicine

## 2016-07-25 ENCOUNTER — Emergency Department (HOSPITAL_COMMUNITY)
Admission: EM | Admit: 2016-07-25 | Discharge: 2016-07-25 | Disposition: A | Discharge status: Home / Self Care | Payer: Medicaid Other | Attending: Emergency Medicine | Admitting: Emergency Medicine

## 2016-07-25 DIAGNOSIS — M791 Myalgia, unspecified site: Secondary | ICD-10-CM

## 2016-07-25 DIAGNOSIS — Z79899 Other long term (current) drug therapy: Secondary | ICD-10-CM | POA: Diagnosis not present

## 2016-07-25 DIAGNOSIS — M546 Pain in thoracic spine: Secondary | ICD-10-CM | POA: Insufficient documentation

## 2016-07-25 MED ORDER — IBUPROFEN 800 MG PO TABS
800.0000 mg | ORAL_TABLET | Freq: Once | ORAL | Status: AC
  Administered 2016-07-25: 800 mg via ORAL
  Filled 2016-07-25: qty 1

## 2016-07-25 MED ORDER — BUPIVACAINE HCL 0.5 % IJ SOLN
15.0000 mL | Freq: Once | INTRAMUSCULAR | Status: AC
  Administered 2016-07-25: 15 mL
  Filled 2016-07-25: qty 15

## 2016-07-25 NOTE — ED Triage Notes (Addendum)
Brought in by EMS from home with c/o right upper back pain (right scapular area), onset 45 minutes ago for "no reason".  Pt describes pain as "stabbing".  Pt denies fall or injury or trauma to the affected area.

## 2016-07-25 NOTE — Discharge Instructions (Signed)
1. Medications: ibuprofen, usual home medications 2. Treatment: rest, drink plenty of fluids, gentle stretching as discussed, alternate ice and heat 3. Follow Up: Please followup with your primary doctor in 3 days for discussion of your diagnoses and further evaluation after today's visit; if you do not have a primary care doctor use the resource guide provided to find one;  Return to the ER for worsening back pain, difficulty walking, loss of bowel or bladder control or other concerning symptoms

## 2016-07-25 NOTE — ED Provider Notes (Signed)
[name] Provider Note   CSN: [id number] Arrival date & time: 07/25/16  0157     History   Chief Complaint Chief Complaint  Patient presents with  . Back Pain    HPI Melanie Cisneros is a 26 y.o. female with a hx of breastfeeding presents to the Emergency Department complaining of gradual, persistent, progressively worsening right upper back pain onset 1am while attempting to put on her bra. Associated symptoms include tightness at the site.  No treatments PTA.  Nothing makes it better and deep breaths and movement makes it worse.  Pt denies fever, chills, CP, SOB.   Pt has been taking ibuprofen at home for her low back pain with a last dose of yesterday afternoon.     The history is provided by the patient and medical records. No language interpreter was used.    Past Medical History:  Diagnosis Date  . Bacterial vaginosis   . Chlamydia   . Gonorrhea   . Obesity   . UTI (lower urinary tract infection)     Patient Active Problem List   Diagnosis Date Noted  . SVD (spontaneous vaginal delivery) 06/15/2016  . Second-degree perineal laceration, with delivery 06/13/2016  . Indication for care in labor or delivery 06/12/2016  . Excess weight gain in pregnancy 06/08/2016  . BMI 45.0-49.9, adult ([place]) 06/06/2016  . S>D 06/06/2016  . Obesity in pregnancy, antepartum 05/11/2016  . Psychosocial stressors 02/25/2016  . 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 Rubella non-immune status, antepartum 01/27/2016  . Supervision of high risk pregnancy in third trimester 11/20/2015    Past Surgical History:  Procedure Laterality Date  . WISDOM TOOTH EXTRACTION      OB History    Gravida Para Term Preterm AB Living   1 1 1     1    SAB TAB Ectopic Multiple Live Births         0 1       Home Medications    Prior to Admission medications   Medication Sig Start Date End Date Taking? Authorizing Provider  calcium  carbonate (TUMS EX) 750 MG chewable tablet Chew 1 tablet by mouth as needed for heartburn.    Historical Provider, MD  docusate sodium (COLACE) 100 MG capsule Take 1 capsule (100 mg total) by mouth 2 (two) times daily as needed for mild constipation. Patient not taking: Reported on 06/06/2016 04/14/16   Kathie Dike Leftwich-Kirby, CNM  hydrOXYzine (VISTARIL) 50 MG capsule Take 50 mg by mouth 3 (three) times daily.    Historical Provider, MD  ibuprofen (ADVIL,MOTRIN) 600 MG tablet Take 1 tablet (600 mg total) by mouth every 6 (six) hours. 06/15/16   Loann Quill, MD  ondansetron (ZOFRAN-ODT) 8 MG disintegrating tablet Take 1 tablet (8 mg total) by mouth every 8 (eight) hours as needed for nausea or vomiting. 04/14/16   Kathie Dike Leftwich-Kirby, CNM  Prenatal MV-Min-FA-Omega-3 (PRENATAL GUMMIES/DHA & FA) 0.4-32.5 MG CHEW Chew 2 each by mouth daily.    Historical Provider, MD    Family History Family History  Problem Relation Age of Onset  . Hypertension Mother     Social History Social History  Substance Use Topics  . Smoking status: Never Smoker  . Smokeless tobacco: Never Used  . Alcohol use No     Allergies   Amoxicillin and Penicillins   Review of Systems Review of Systems  Musculoskeletal: Positive for back pain.  All other  systems reviewed and are negative.    Physical Exam Updated Vital Signs BP 121/70 (BP Location: Left Arm)   Pulse 76   Temp 98.2 F (36.8 C) (Oral)   Resp 18   Ht 5\' 5"  (1.651 m)   Wt 99.8 kg   SpO2 99%   BMI 36.61 kg/m   Physical Exam  Constitutional: She appears well-developed and well-nourished. No distress.  HENT:  Head: Normocephalic and atraumatic.  Mouth/Throat: Oropharynx is clear and moist. No oropharyngeal exudate.  Eyes: Conjunctivae are normal.  Neck: Normal range of motion. Neck supple.  Full ROM without pain  Cardiovascular: Normal rate, regular rhythm and intact distal pulses.   Pulmonary/Chest: Effort normal and breath sounds  normal. No respiratory distress. She has no wheezes.  Abdominal: Soft. She exhibits no distension. There is no tenderness.  Musculoskeletal:       Right shoulder: She exhibits normal range of motion, no tenderness, no bony tenderness and no swelling.       Left shoulder: She exhibits normal range of motion, no tenderness and no bony tenderness.       Back:  Full range of motion of the T-spine and L-spine No midline tenderness to the  T-spine or L-spine Tenderness to palpation of the right paraspinous muscles of the upper T spine with palpable trigger point R shoulder: no TTP of the scapula, FROM of the right shoulder with pain, no joint line tenderness.  Sensation intact in the RUE.  Strength 5/5 in the RUE.  Lymphadenopathy:    She has no cervical adenopathy.  Neurological: She is alert.  Speech is clear and goal oriented, follows commands Normal 5/5 strength in upper and lower extremities bilaterally including dorsiflexion and plantar flexion, strong and equal grip strength Sensation normal to light and sharp touch Moves extremities without ataxia, coordination intact Normal gait Normal balance  Skin: Skin is warm and dry. No rash noted. She is not diaphoretic. No erythema.  Psychiatric: She has a normal mood and affect. Her behavior is normal.  Nursing note and vitals reviewed.    ED Treatments / Results  Labs (all labs ordered are listed, but only abnormal results are displayed) Labs Reviewed - No data to display  EKG  EKG Interpretation None       Radiology No results found.  Procedures Procedures (including critical care time)  Patient given 0.5% Marcaine injection in 1 trigger points. Sydni D Yost consents verbally to this.   Procedure: With alcohol prep of the trapezius musculature close to the spine of the scapula.  Palpable firm area of tenderness consistent with trigger point is localized. It was approached with 25-gauge needle and 7 mL of Marcaine was  injected.  Tolerated this well with immediate pain relief.    Medications Ordered in ED Medications  bupivacaine (MARCAINE) 0.5 % (with pres) injection 15 mL (15 mLs Infiltration Given 07/25/16 0328)  ibuprofen (ADVIL,MOTRIN) tablet 800 mg (800 mg Oral Given 07/25/16 0328)     Initial Impression / Assessment and Plan / ED Course  I have reviewed the triage vital signs and the nursing notes.  Pertinent labs & imaging results that were available during my care of the patient were reviewed by me and considered in my medical decision making (see chart for details).     Patient with back pain.  No neurological deficits and normal neuro exam.  Immediate relief after trigger point injection. No loss of bowel or bladder control.  No concern for cauda equina. No  trauma.  No fever, night sweats, weight loss, h/o cancer, IVDU.  RICE protocol indicated and discussed with patient.    Final Clinical Impressions(s) / ED Diagnoses   Final diagnoses:  Acute right-sided thoracic back pain  Trigger point    New Prescriptions Discharge Medication List as of 07/25/2016  4:15 AM         Abigail Butts, PA-C 07/25/16 2811    Rolland Porter, MD 07/25/16 [phone]

## 2016-07-25 NOTE — ED Notes (Signed)
Bed: HS30 Expected date:  Expected time:  Means of arrival:  Comments: 26 yo F  Back pain

## 2016-07-28 ENCOUNTER — Ambulatory Visit (INDEPENDENT_AMBULATORY_CARE_PROVIDER_SITE_OTHER): Payer: Medicaid Other | Admitting: Obstetrics and Gynecology

## 2016-07-28 VITALS — BP 127/78 | HR 75 | Ht 65.0 in | Wt 258.3 lb

## 2016-07-28 DIAGNOSIS — N760 Acute vaginitis: Secondary | ICD-10-CM

## 2016-07-28 DIAGNOSIS — B9689 Other specified bacterial agents as the cause of diseases classified elsewhere: Secondary | ICD-10-CM

## 2016-07-28 MED ORDER — METRONIDAZOLE 0.75 % VA GEL: 1.0000 | Freq: Two times a day (BID) | VAGINAL | 0 refills | Status: DC

## 2016-07-28 NOTE — Progress Notes (Signed)
Obstetrics Visit Postpartum Visit  Appointment Date: 07/28/2016  OB[facility]N Clinic: [facility]  Primary Care Provider: No PCP Per Patient  Chief Complaint:  Chief Complaint  Patient presents with  . Postpartum Care    History of Present Illness: Melanie Cisneros is a 26 y.o. African-American G1P1001 (Patient's last menstrual period was 07/20/2016 (approximate).), seen for the above chief complaint. Her past medical history is significant for BMI 40s   She is s/p 1/29 VAVD (fetal decels); she was discharged to home on PPD#2  Vaginal bleeding or discharge: Yes (menses, but none outside of that) Breast or formula feeding: breast Intercourse: No  Contraception after delivery: No  PP depression s/s: No  Any bowel or bladder issues: No  Pap smear: no abnormalities (date: 2017)  Infant is doing well and she states that she has a good support system at home.   Review of Systems:  as noted in the History of Present Illness.  Medications PNV   Allergies Amoxicillin and Penicillins  Physical Exam:  BP 127/78   Pulse 75   Ht 5\' 5"  (1.651 m)   Wt 258 lb 4.8 oz (117.2 kg)   LMP 07/20/2016 (Approximate)   Breastfeeding? Yes   BMI 42.98 kg/m  Body mass index is 42.98 kg/m. General appearance: Well nourished, well developed female in no acute distress.  Respiratory:  Normal respiratory effort Abdomen: positive bowel sounds and no masses, hernias; diffusely non tender to palpation, non distended Neuro/Psych:  Normal mood and affect.  Skin:  Warm and dry.  Lymphatic:  No inguinal lymphadenopathy.   Pelvic exam: is not limited by body habitus EGBUS: within normal limits Vagina: within normal limits and with no blood in the vault. Mild to moderate amount of white, watery d/c in vault, Cervix:  no lesions or cervical motion tenderness Uterus:  nonenlarged and nttp Adnexa:  normal adnexa and no mass, fullness, tenderness Rectovaginal: deferred  Laboratory:  none  PP Depression Screening:  0  Assessment: patient doing well  Plan: declines anything for [facility]. D/w her that even though breastfeeding, since menses have resume, she can get pregnant again. metrogel for BV  RTC PRN  Durene Romans MD Attending [facility])

## 2016-07-29 ENCOUNTER — Telehealth: Payer: Self-pay | Admitting: *Deleted

## 2016-07-29 ENCOUNTER — Other Ambulatory Visit: Payer: Self-pay | Admitting: Family Medicine

## 2016-07-29 DIAGNOSIS — B9689 Other specified bacterial agents as the cause of diseases classified elsewhere: Secondary | ICD-10-CM

## 2016-07-29 DIAGNOSIS — N760 Acute vaginitis: Principal | ICD-10-CM

## 2016-07-29 MED ORDER — METRONIDAZOLE 0.75 % VA GEL: 1.0000 | Freq: Two times a day (BID) | VAGINAL | 0 refills | Status: DC

## 2016-07-29 MED ORDER — IBUPROFEN 600 MG PO TABS: 600.0000 mg | ORAL_TABLET | Freq: Four times a day (QID) | ORAL | 0 refills | Status: DC

## 2016-07-29 MED ORDER — METRONIDAZOLE 500 MG PO TABS: 500.0000 mg | ORAL_TABLET | Freq: Two times a day (BID) | ORAL | 0 refills | Status: DC

## 2016-07-29 NOTE — Telephone Encounter (Signed)
Called patient to inform her that her metrogel prescription has been changed to the oral flagyl, which Medicaid will pay for. Understanding voiced.

## 2016-09-02 ENCOUNTER — Other Ambulatory Visit: Payer: Self-pay | Admitting: Family Medicine

## 2016-11-12 ENCOUNTER — Encounter (HOSPITAL_COMMUNITY): Payer: Self-pay

## 2016-11-12 ENCOUNTER — Emergency Department (HOSPITAL_COMMUNITY)
Admission: EM | Admit: 2016-11-12 | Discharge: 2016-11-12 | Disposition: A | Discharge status: Home / Self Care | Payer: Medicaid Other | Attending: Emergency Medicine | Admitting: Emergency Medicine

## 2016-11-12 DIAGNOSIS — N898 Other specified noninflammatory disorders of vagina: Secondary | ICD-10-CM

## 2016-11-12 LAB — WET PREP, GENITAL
Clue Cells Wet Prep HPF POC: NONE SEEN
Sperm: NONE SEEN
TRICH WET PREP: NONE SEEN
YEAST WET PREP: NONE SEEN

## 2016-11-12 LAB — URINALYSIS, ROUTINE W REFLEX MICROSCOPIC
Bilirubin Urine: NEGATIVE
GLUCOSE, UA: NEGATIVE mg/dL
HGB URINE DIPSTICK: NEGATIVE
Ketones, ur: NEGATIVE mg/dL
LEUKOCYTES UA: NEGATIVE
Nitrite: NEGATIVE
Protein, ur: NEGATIVE mg/dL
SPECIFIC GRAVITY, URINE: 1.013 (ref 1.005–1.030)
pH: 5 (ref 5.0–8.0)

## 2016-11-12 LAB — PREGNANCY, URINE: PREG TEST UR: NEGATIVE

## 2016-11-12 NOTE — ED Notes (Signed)
Pt refused to stay for paperwork or DC vital signs, states her ride is here.

## 2016-11-12 NOTE — ED Triage Notes (Signed)
Pt presents to the ed with complaints of vaginal discharge x 1 week, denies any other symptoms.

## 2016-11-12 NOTE — ED Provider Notes (Signed)
Emergency Department Provider Note   I have reviewed the triage vital signs and the nursing notes.   HISTORY  Chief Complaint Vaginal Discharge   HPI Melanie Cisneros is a 26 y.o. female presents to the emergency department for evaluation of vaginal discharge. She has had one week of symptoms without bleeding. No lower abdominal pain. No flank pain. No back pain. Any fevers or chills. No dysuria, hesitancy, urgency. She has not been sexually active since the birth of her daughter several months ago. She has no concern for STD exposure or possible pregnancy. Symptoms are constant and not worsening but not improving.    Past Medical History:  Diagnosis Date  . Bacterial vaginosis   . Chlamydia   . Gonorrhea   . Obesity   . UTI (lower urinary tract infection)     Patient Active Problem List   Diagnosis Date Noted  . Indication for care in labor or delivery 06/12/2016  . BMI 45.0-49.9, adult ([place]) 06/06/2016  . Psychosocial stressors 02/25/2016  . 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 Rubella non-immune status, antepartum 01/27/2016  . Supervision of high risk pregnancy in third trimester 11/20/2015    Past Surgical History:  Procedure Laterality Date  . WISDOM TOOTH EXTRACTION      Current Outpatient Rx  . Order #: [id number] Class: Historical Med  . Order #: [id number] Class: Normal  . Order #: [id number] Class: Historical Med  . Order #: [id number] Class: Normal  . Order #: [id number] Class: Normal  . Order #: [id number] Class: Normal  . Order #: [id number] Class: Historical Med    Allergies Amoxicillin and Penicillins  Family History  Problem Relation Age of Onset  . Hypertension Mother     Social History Social History  Substance Use Topics  . Smoking status: Never Smoker  . Smokeless tobacco: Never Used  . Alcohol use No    Review of Systems  Constitutional: No fever/chills Eyes: No visual changes. ENT: No  sore throat. Cardiovascular: Denies chest pain. Respiratory: Denies shortness of breath. Gastrointestinal: No abdominal pain.  No nausea, no vomiting.  No diarrhea.  No constipation. Genitourinary: Negative for dysuria. Positive vaginal discharge.  Musculoskeletal: Negative for back pain. Skin: Negative for rash. Neurological: Negative for headaches, focal weakness or numbness.  10-point ROS otherwise negative.  ____________________________________________   PHYSICAL EXAM:  VITAL SIGNS: ED Triage Vitals [11/12/16 0943]  Enc Vitals Group     BP 118/75     Pulse Rate 91     Resp 18     Temp 97.9 F (36.6 C)     Temp src      SpO2 98 %     Weight 258 lb (117 kg)    Constitutional: Alert and oriented. Well appearing and in no acute distress. Eyes: Conjunctivae are normal.  Head: Atraumatic. Nose: No congestion/rhinnorhea. Mouth/Throat: Mucous membranes are moist.  Oropharynx non-erythematous. Neck: No stridor. Cardiovascular: Normal rate, regular rhythm. Good peripheral circulation. Grossly normal heart sounds.   Respiratory: Normal respiratory effort.  No retractions. Lungs CTAB. Gastrointestinal: Soft and nontender. No distention.  Genitourinary: Mild/moderate vaginal discharge. No CMT or adnexal tenderness/fullness. No vaginal bleeding.  Musculoskeletal: No lower extremity tenderness nor edema. No gross deformities of extremities. Neurologic:  Normal speech and language. No gross focal neurologic deficits are appreciated.  Skin:  Skin is warm, dry and intact. No rash noted.  ____________________________________________   LABS (all labs ordered are listed, but only abnormal results are displayed)  Labs  Reviewed  WET PREP, GENITAL - Abnormal; Notable for the following:       Result Value   WBC, Wet Prep HPF POC MANY (*)    All other components within normal limits  URINALYSIS, ROUTINE W REFLEX MICROSCOPIC - Abnormal; Notable for the following:    APPearance HAZY (*)     All other components within normal limits  PREGNANCY, URINE  GC/CHLAMYDIA PROBE AMP ([place]) NOT AT [facility]   ____________________________________________   PROCEDURES  Procedure(s) performed:   Procedures  None ____________________________________________   INITIAL IMPRESSION / ASSESSMENT AND PLAN / ED COURSE  Pertinent labs & imaging results that were available during my care of the patient were reviewed by me and considered in my medical decision making (see chart for details).  Patient resents to the emergency department for evaluation of vaginal discharge. She has no abdominal or CVA tenderness. No signs or symptoms to suggest pyelonephritis or severe intra-abdominal infection. Plan for pelvic exam with wet prep along with STD testing. Patient does not report any recent sexual activity and does not want to be treated empirically for STDs at this time. She does have a severe penicillin allergy listed on the chart.   No BV, Trich, or candida on wet prep. Low suspicion for STD so patient does not want abx treated here in the ED. Discussed PCP and Gyn follow up.   At this time, I do not feel there is any life-threatening condition present. I have reviewed and discussed all results (EKG, imaging, lab, urine as appropriate), exam findings with patient. I have reviewed nursing notes and appropriate previous records.  I feel the patient is safe to be discharged home without further emergent workup. Discussed usual and customary return precautions. Patient and family (if present) verbalize understanding and are comfortable with this plan.  Patient will follow-up with their primary care provider. If they do not have a primary care provider, information for follow-up has been provided to them. All questions have been answered.  ____________________________________________  FINAL CLINICAL IMPRESSION(S) / ED DIAGNOSES  Final diagnoses:  Vaginal discharge     MEDICATIONS GIVEN  DURING THIS VISIT:  Medications - No data to display   NEW OUTPATIENT MEDICATIONS STARTED DURING THIS VISIT:  None  Note:  This document was prepared using Dragon voice recognition software and may include unintentional dictation errors.  Nanda Quinton, MD Emergency Medicine   Khyler Eschmann, [facility], MD 11/12/16 [phone]

## 2016-11-14 LAB — GC/CHLAMYDIA PROBE AMP (~~LOC~~) NOT AT ARMC
CHLAMYDIA, DNA PROBE: NEGATIVE
NEISSERIA GONORRHEA: NEGATIVE

## 2016-11-19 IMAGING — US US OB TRANSVAGINAL
1 series · 15 of 28 positions shown · non-contrast
Comparison: None.

CLINICAL DATA: Pregnant, cramping x5 days

EXAM:
TRANSVAGINAL OB ULTRASOUND
TECHNIQUE: Transvaginal ultrasound was performed for complete evaluation of the
gestation as well as the maternal uterus, adnexal regions, and
pelvic cul-de-sac.

[Series 1: us ob transvaginal · 15 of 46 slices shown]
[im 1/46]
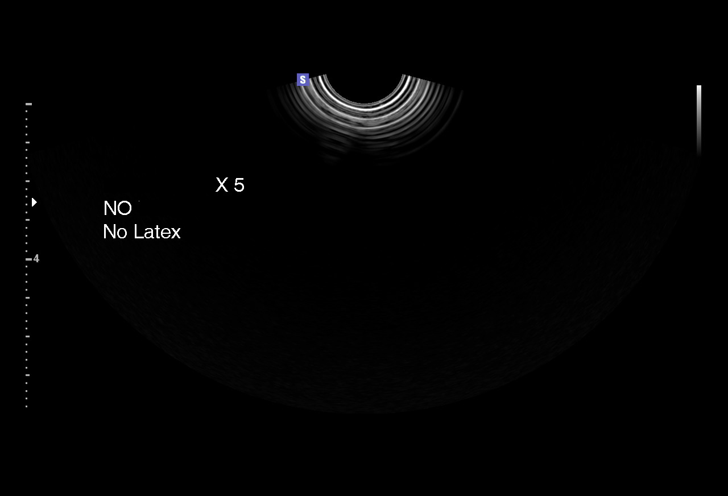
[im 4/46]
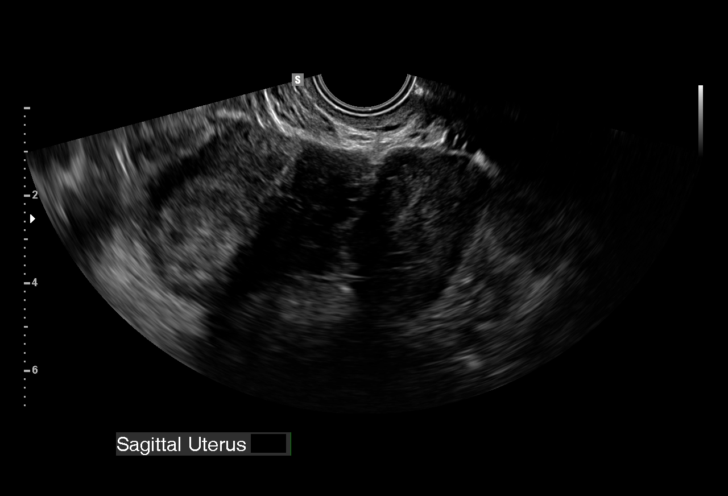
[im 7/46]
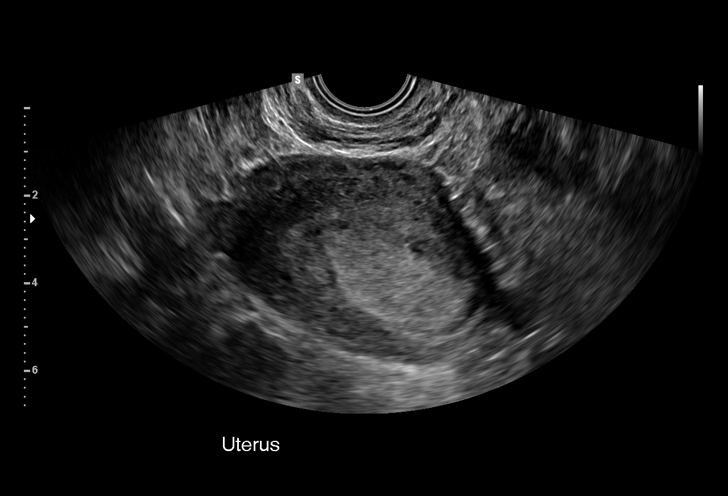
[im 11/46]
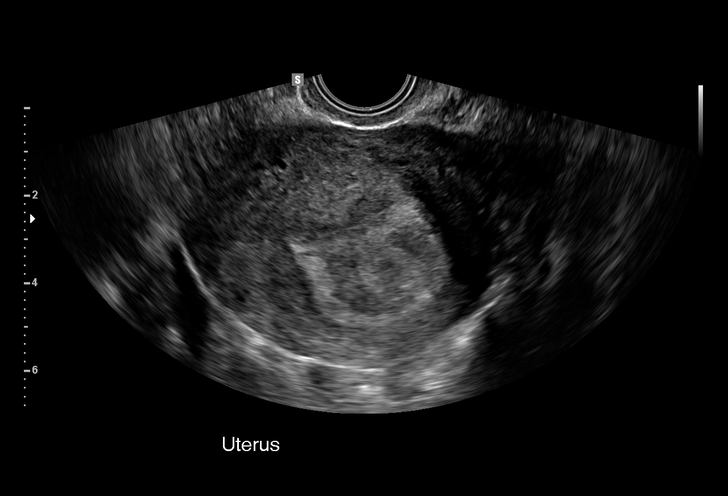
[im 14/46]
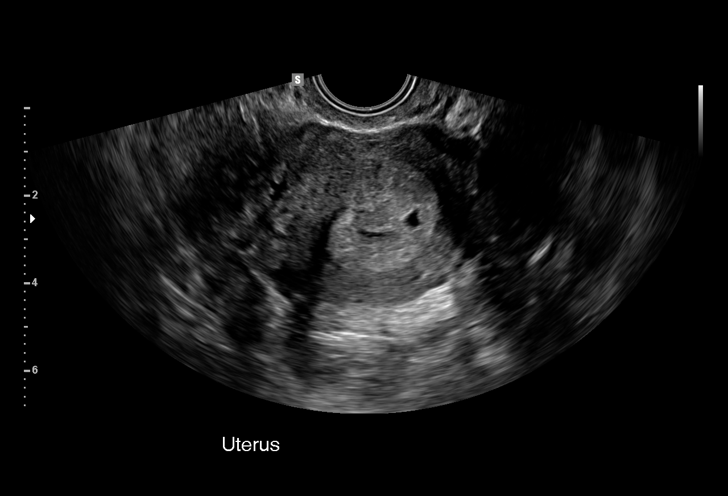
[im 17/46]
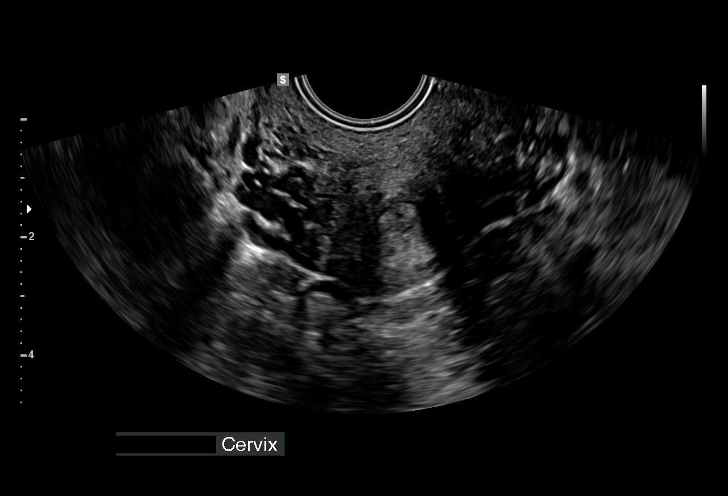
[im 21/46]
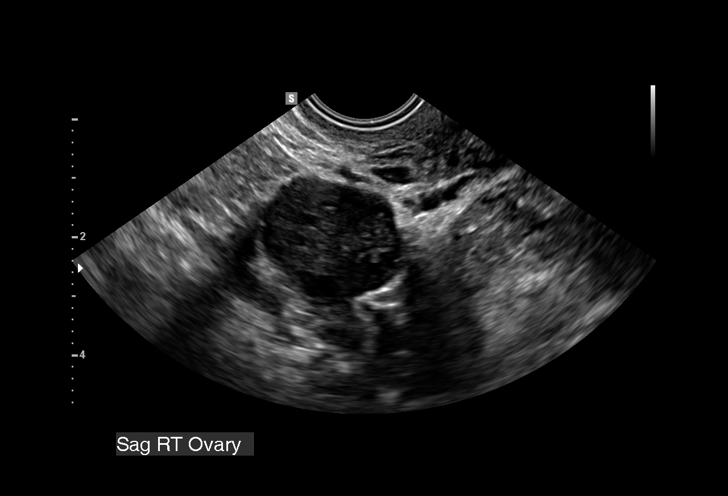
[im 24/46]
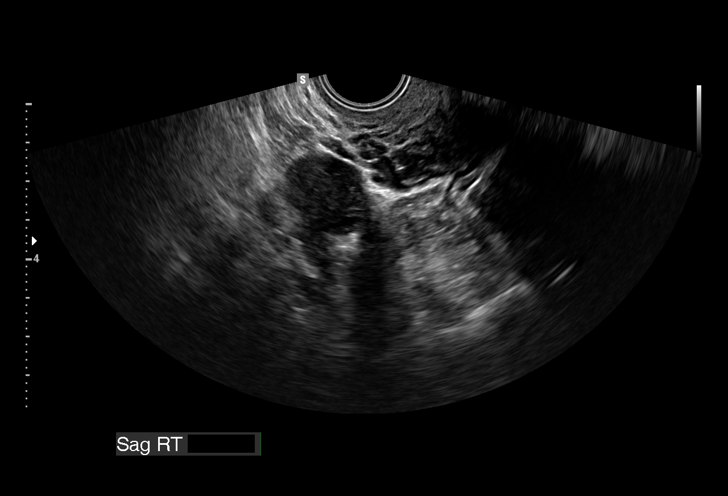
[im 26/46]
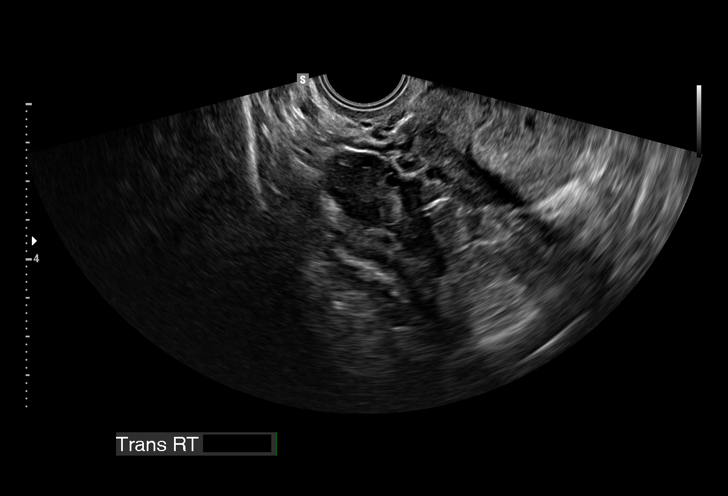
[im 29/46]
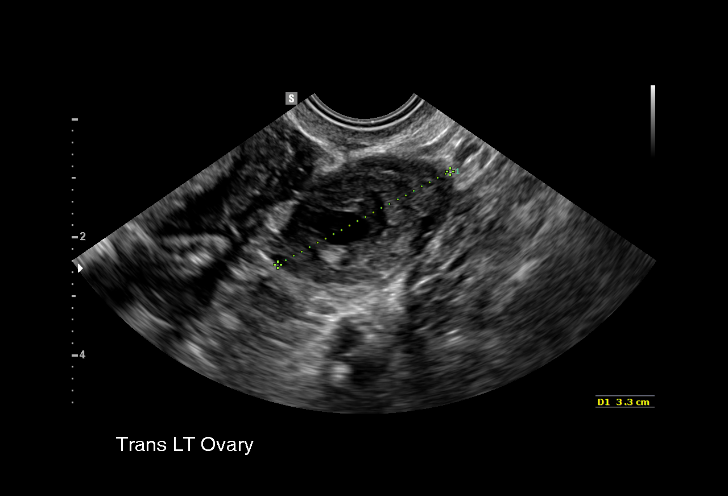
[im 32/46]
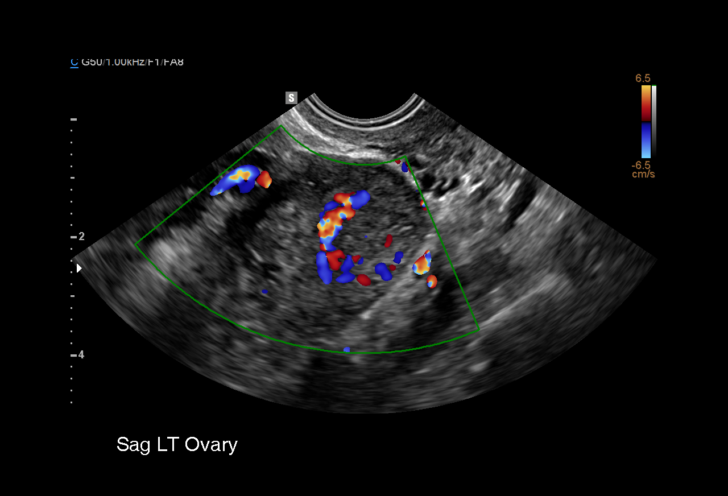
[im 36/46]
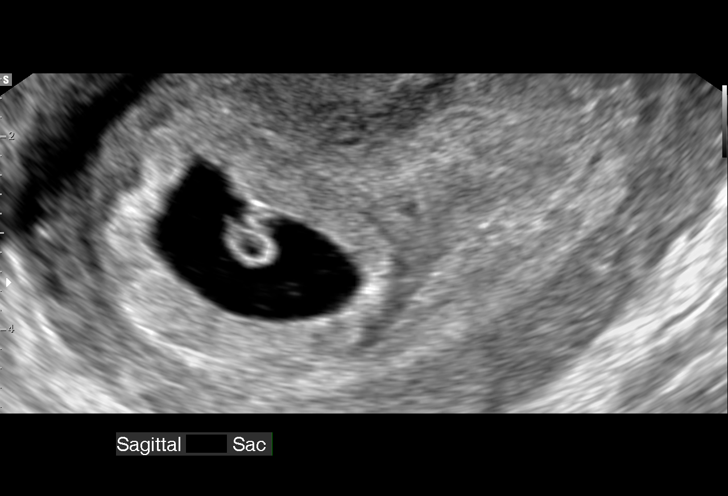
[im 39/46]
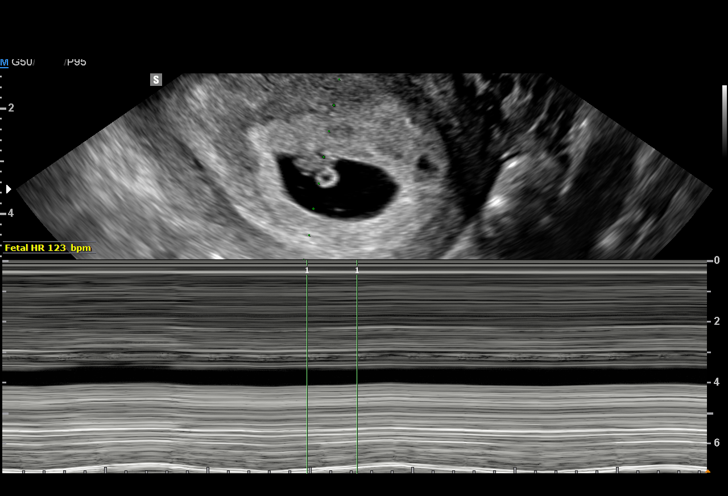
[im 42/46]
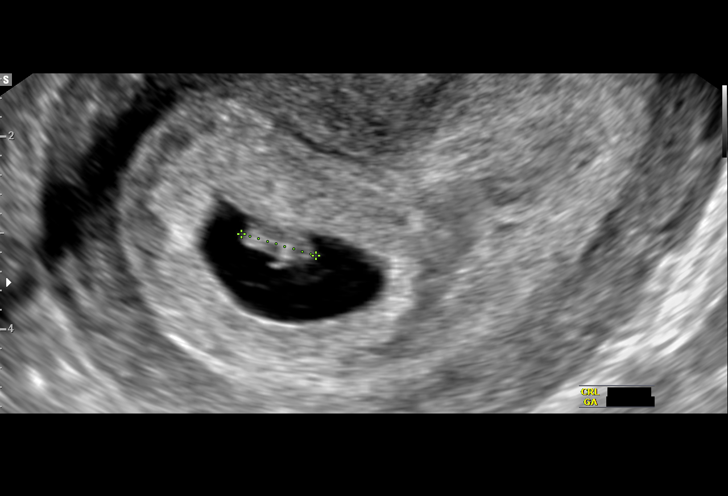
[im 46/46]
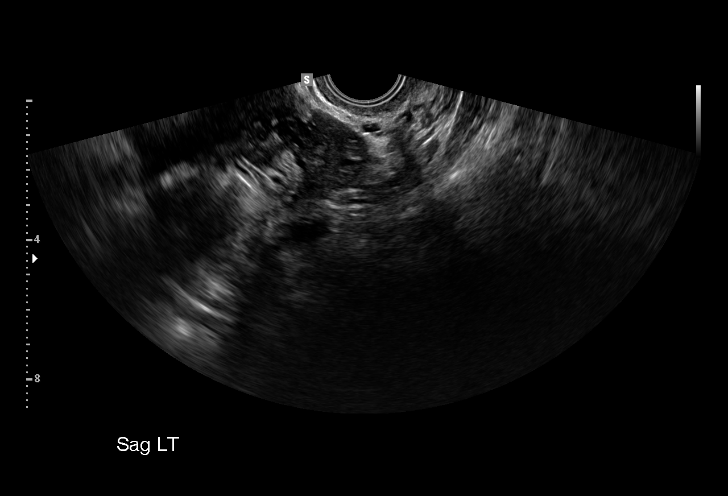

[15 of 28 positions shown; findings below may reference images not displayed]

FINDINGS: Intrauterine gestational sac: Single

Yolk sac:  Present

Embryo:  Present

Cardiac Activity: Present

Heart Rate: 123 bpm

CRL:   8  mm   6 w 5 d                  US EDC: 06/09/2016

Subchorionic hemorrhage:  None visualized.

Maternal uterus/adnexae: Bilateral ovaries are within normal limits.

No free fluid.
IMPRESSION: Single live intrauterine gestation, with estimated gestational age 6
weeks 5 days by crown-rump length.

## 2016-12-19 ENCOUNTER — Emergency Department (HOSPITAL_COMMUNITY)
Admission: EM | Admit: 2016-12-19 | Discharge: 2016-12-19 | Discharge status: Against Medical Advice | Payer: Medicaid Other | Attending: Emergency Medicine | Admitting: Emergency Medicine

## 2016-12-19 ENCOUNTER — Encounter (HOSPITAL_COMMUNITY): Payer: Self-pay | Admitting: Emergency Medicine

## 2016-12-19 DIAGNOSIS — M791 Myalgia: Secondary | ICD-10-CM | POA: Diagnosis not present

## 2016-12-19 DIAGNOSIS — R51 Headache: Secondary | ICD-10-CM | POA: Insufficient documentation

## 2016-12-19 DIAGNOSIS — R509 Fever, unspecified: Secondary | ICD-10-CM | POA: Insufficient documentation

## 2016-12-19 DIAGNOSIS — Z79899 Other long term (current) drug therapy: Secondary | ICD-10-CM | POA: Diagnosis not present

## 2016-12-19 DIAGNOSIS — R519 Headache, unspecified: Secondary | ICD-10-CM

## 2016-12-19 MED ORDER — ACETAMINOPHEN 325 MG PO TABS
650.0000 mg | ORAL_TABLET | Freq: Once | ORAL | Status: AC | PRN
  Administered 2016-12-19: 650 mg via ORAL

## 2016-12-19 MED ORDER — ACETAMINOPHEN 325 MG PO TABS
ORAL_TABLET | ORAL | Status: AC
  Filled 2016-12-19: qty 2

## 2016-12-19 MED ORDER — IBUPROFEN 400 MG PO TABS
600.0000 mg | ORAL_TABLET | Freq: Once | ORAL | Status: DC
  Filled 2016-12-19: qty 1

## 2016-12-19 NOTE — ED Notes (Signed)
Pt not on her room or bathrooms on pod c when this RN check to give her pain medication, pt not up for dc yet, pt leave against medical advice after been seen by PA.

## 2016-12-19 NOTE — ED Triage Notes (Signed)
Pt woke up today w/ generalized body aches, fever and a headache.  Denies n/v/d, SOB or LOC.

## 2016-12-19 NOTE — ED Notes (Signed)
Writer called for triage room no response

## 2016-12-19 NOTE — ED Provider Notes (Cosign Needed)
[facility] DEPT Provider Note   CSN: [id number] Arrival date & time: 12/19/16  1825  By signing my name below, I, Margit Banda, attest that this documentation has been prepared under the direction and in the presence of Rocko Fesperman, PA-C. Electronically Signed: Margit Banda, ED Scribe. 12/19/16. 10:09 PM.  History   Chief Complaint Chief Complaint  Patient presents with  . Generalized Body Aches  . Headache  . Fever    HPI Melanie Cisneros is a 26 y.o. female who presents to the Emergency Department complaining of a headache that started this morning after she woke up. Associated sx include fever, generalized body aches, and chills. Her headache is bilateral, global, rated 7 out of 10, vague description, nonradiating. She has gotten some relief with Tylenol. She has a history of similar headaches when she eats pork. Patient ate pork yesterday. No known tick bites or time spent in wooded or grassy areas. Pt denies neck pain, neck stiffness, sore throat, cough, ear pain, rash, nausea, vomiting, diarrhea, SOB, neuro deficits, visual disturbances, photophobia, or any other complaints at this time.  Patient also notes that she has a 33-month-old infant and is breast-feeding.     The history is provided by the patient. No language interpreter was used.    Past Medical History:  Diagnosis Date  . Bacterial vaginosis   . Chlamydia   . Gonorrhea   . Obesity   . UTI (lower urinary tract infection)     Patient Active Problem List   Diagnosis Date Noted  . Indication for care in labor or delivery 06/12/2016  . BMI 45.0-49.9, adult ([place]) 06/06/2016  . Psychosocial stressors 02/25/2016  . 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 Rubella non-immune status, antepartum 01/27/2016  . Supervision of high risk pregnancy in third trimester 11/20/2015    Past Surgical History:  Procedure Laterality Date  . WISDOM TOOTH EXTRACTION       OB History    Gravida Para Term Preterm AB Living   1 1 1     1    SAB TAB Ectopic Multiple Live Births         0 1       Home Medications    Prior to Admission medications   Medication Sig Start Date End Date Taking? Authorizing Provider  calcium carbonate (TUMS EX) 750 MG chewable tablet Chew 1 tablet by mouth as needed for heartburn.    [provider]  docusate sodium (COLACE) 100 MG capsule Take 1 capsule (100 mg total) by mouth 2 (two) times daily as needed for mild constipation. Patient not taking: Reported on 06/06/2016 04/14/16   Fatima Blank A, CNM  hydrOXYzine (VISTARIL) 50 MG capsule Take 50 mg by mouth 3 (three) times daily.    [provider]  ibuprofen (ADVIL,MOTRIN) 600 MG tablet TAKE 1 TABLET (600 MG TOTAL) BY MOUTH EVERY 6 (SIX) HOURS. Patient not taking: Reported on 11/12/2016 09/02/16   Donnamae Jude, MD  metroNIDAZOLE (FLAGYL) 500 MG tablet Take 1 tablet (500 mg total) by mouth 2 (two) times daily. Patient not taking: Reported on 11/12/2016 07/29/16   Aletha Halim, MD  ondansetron (ZOFRAN-ODT) 8 MG disintegrating tablet Take 1 tablet (8 mg total) by mouth every 8 (eight) hours as needed for nausea or vomiting. Patient not taking: Reported on 07/28/2016 04/14/16   Leftwich-Kirby, Kathie Dike, CNM  Prenatal MV-Min-FA-Omega-3 (PRENATAL GUMMIES/DHA & FA) 0.4-32.5 MG CHEW Chew 2 each by mouth daily.  [provider]    Family History Family History  Problem Relation Age of Onset  . Hypertension Mother     Social History Social History  Substance Use Topics  . Smoking status: Never Smoker  . Smokeless tobacco: Never Used  . Alcohol use No     Allergies   Amoxicillin and Penicillins   Review of Systems Review of Systems  Constitutional: Positive for chills and fever.  HENT: Negative for ear pain, sore throat and trouble swallowing.   Eyes: Negative for photophobia and visual disturbance.  Respiratory: Negative for  cough and shortness of breath.   Cardiovascular: Negative for chest pain.  Gastrointestinal: Negative for abdominal pain, diarrhea, nausea and vomiting.  Musculoskeletal: Positive for myalgias. Negative for arthralgias, neck pain and neck stiffness.  Skin: Negative for rash.  Neurological: Positive for headaches. Negative for dizziness, syncope, weakness, light-headedness and numbness.  All other systems reviewed and are negative.    Physical Exam Updated Vital Signs BP 106/65 (BP Location: Right Arm)   Pulse 95   Temp 98.3 F (36.8 C) (Oral)   Resp 19   Ht 5' (1.524 m)   Wt 220 lb (99.8 kg)   SpO2 99%   BMI 42.97 kg/m   Physical Exam  Constitutional: She is oriented to person, place, and time. She appears well-developed and well-nourished. No distress.  HENT:  Head: Normocephalic and atraumatic.  Mouth/Throat: Oropharynx is clear and moist.  Eyes: Pupils are equal, round, and reactive to light. Conjunctivae and EOM are normal.  Neck: Normal range of motion and full passive range of motion without pain. Neck supple. No neck rigidity. No Brudzinski's sign and no Kernig's sign noted.  Cardiovascular: Normal rate, regular rhythm, normal heart sounds and intact distal pulses.   Pulmonary/Chest: Effort normal and breath sounds normal. No respiratory distress.  Abdominal: Soft. There is no tenderness. There is no guarding.  Musculoskeletal: She exhibits no edema.  Normal motor function intact in all extremities and spine. No midline spinal tenderness.   Lymphadenopathy:    She has no cervical adenopathy.  Neurological: She is alert and oriented to person, place, and time.  No sensory deficits. Strength 5/5 in all extremities. No gait disturbance. Coordination intact including heel to shin and finger to nose. Cranial nerves III-XII grossly intact. No facial droop.   Skin: Skin is warm and dry. Capillary refill takes less than 2 seconds. She is not diaphoretic.  Psychiatric: She has a  normal mood and affect. Her behavior is normal.  Nursing note and vitals reviewed.    ED Treatments / Results  Labs (all labs ordered are listed, but only abnormal results are displayed) Labs Reviewed  INFLUENZA PANEL BY PCR (TYPE A & B)    EKG  EKG Interpretation None       Radiology No results found.  Procedures Procedures (including critical care time)  Medications Ordered in ED Medications  acetaminophen (TYLENOL) 325 MG tablet (0 mg  Hold 12/19/16 2030)  ibuprofen (ADVIL,MOTRIN) tablet 600 mg (not administered)  acetaminophen (TYLENOL) tablet 650 mg (650 mg Oral Given 12/19/16 2013)     Initial Impression / Assessment and Plan / ED Course  I have reviewed the triage vital signs and the nursing notes.  Pertinent labs & imaging results that were available during my care of the patient were reviewed by me and considered in my medical decision making (see chart for details).     Patient presents with headache, fever, body aches, and chills. Patient  is nontoxic appearing, not tachycardic, not tachypneic, not hypotensive, maintains SPO2 of 98-99% on room air, and is in no apparent distress. Will swab for the flu. I suggested to the patient that she stay for IV fluids, headache management, and basic labs. However, patient indicated that she was not able to stay seemingly due to pressure from her mother at the bedside. My suspicion for pathology such as meningitis is low in this patient, however, I do believe further assessment and treatment would have been valuable. Patient eloped prior to official discharge and without further comment to myself or ED staff.   Vitals:   12/19/16 2006 12/19/16 2007 12/19/16 2119  BP: 114/64  106/65  Pulse: 96  95  Resp: 18  19  Temp: (!) 101.4 F (38.6 C)  98.3 F (36.8 C)  TempSrc: Oral  Oral  SpO2: 98%  99%  Weight:  99.8 kg (220 lb)   Height:  5' (1.524 m)      Final Clinical Impressions(s) / ED Diagnoses   Final diagnoses:    None    New Prescriptions Discharge Medication List as of 12/19/2016 10:49 PM     I personally performed the services described in this documentation, which was scribed in my presence. The recorded information has been reviewed and is accurate.    Lorayne Bender, PA-C 12/20/16 0111

## 2016-12-20 LAB — INFLUENZA PANEL BY PCR (TYPE A & B)
INFLAPCR: NEGATIVE
INFLBPCR: NEGATIVE

## 2017-07-07 IMAGING — US US MFM OB FOLLOW-UP
1 series · 14 of 28 positions shown · non-contrast
Comparison: none

[Series 1: us mfm ob follow-up · 14 of 44 slices shown]
[im 2/44]
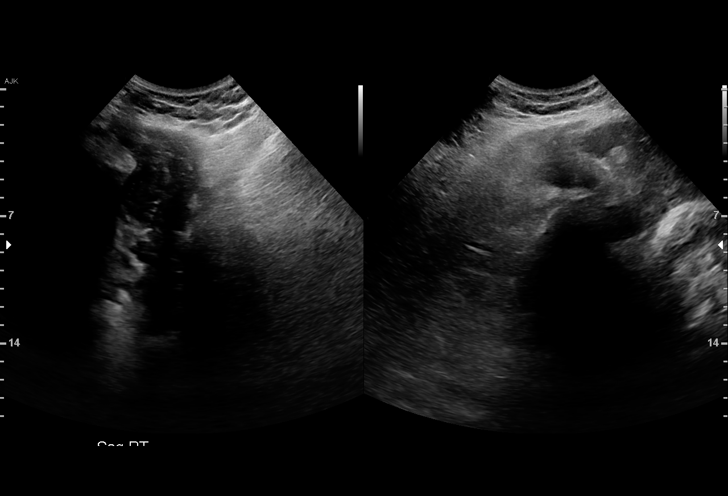
[im 5/44]
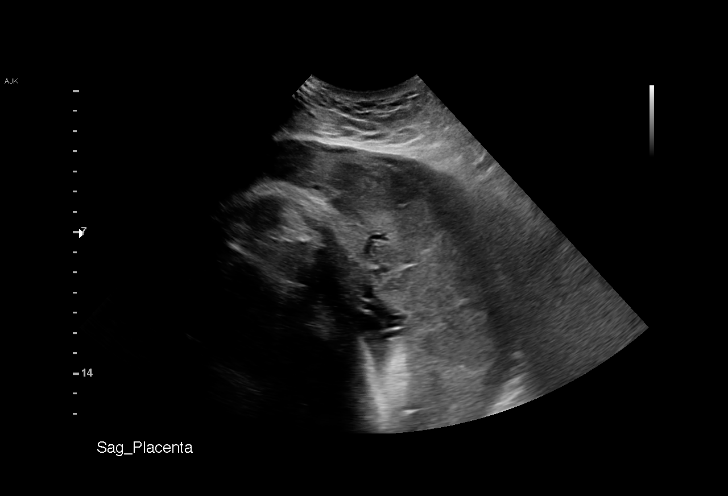
[im 8/44]
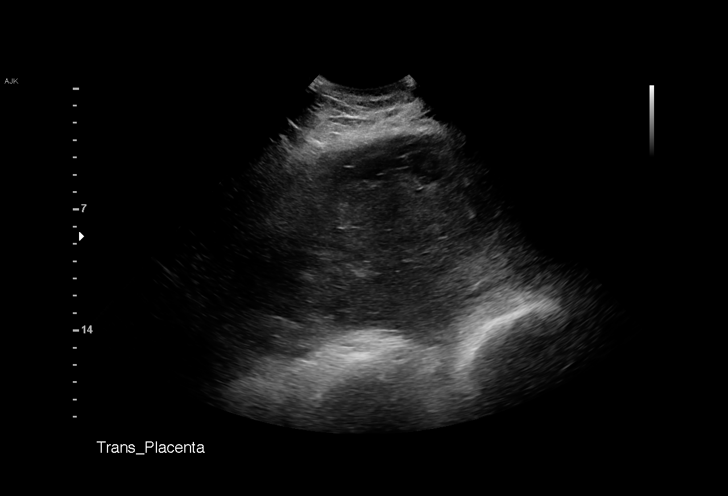
[im 12/44]
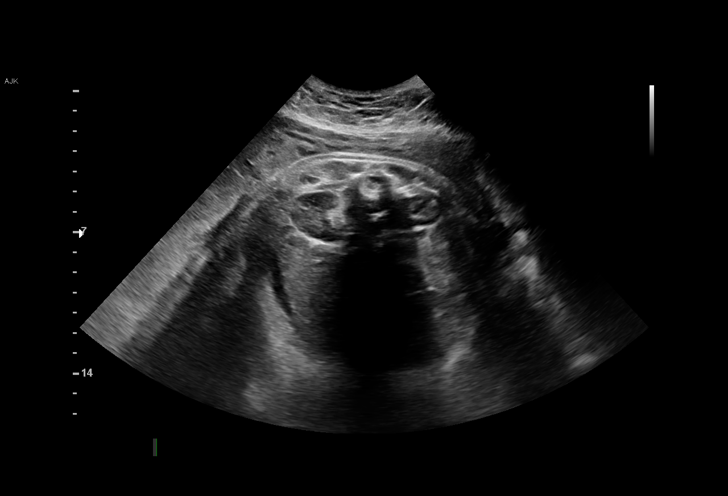
[im 15/44]
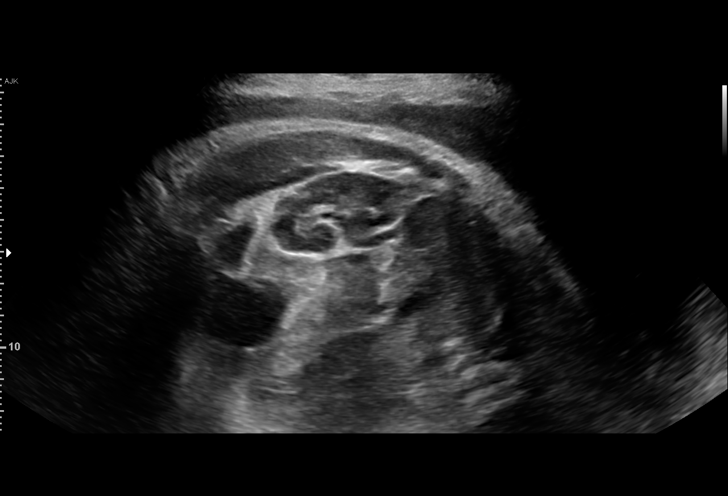
[im 18/44]
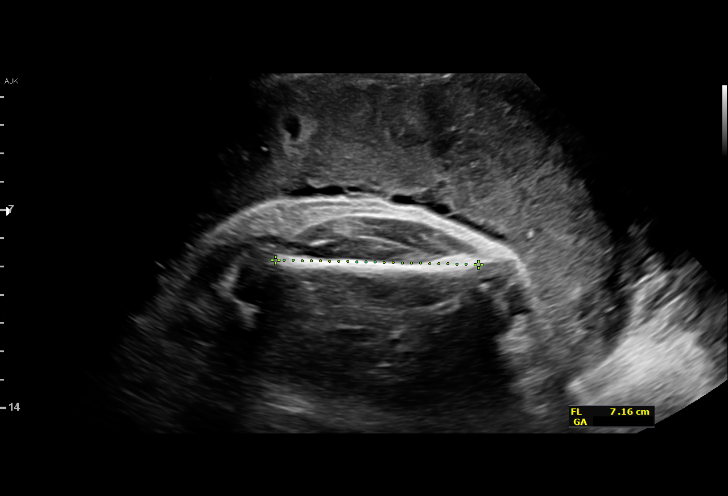
[im 21/44]
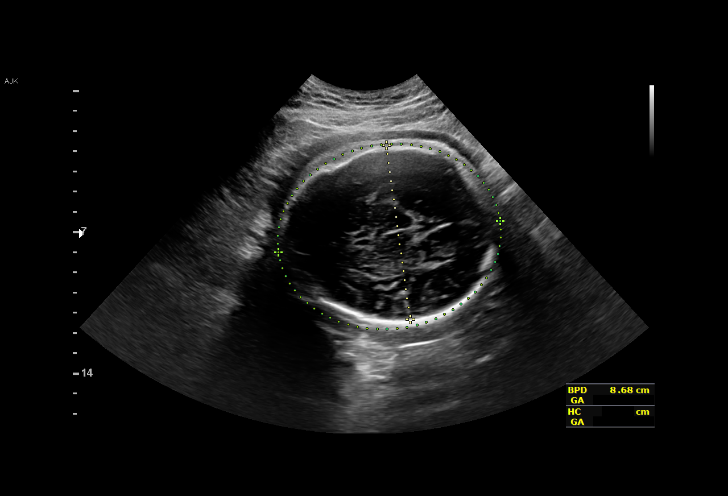
[im 24/44]
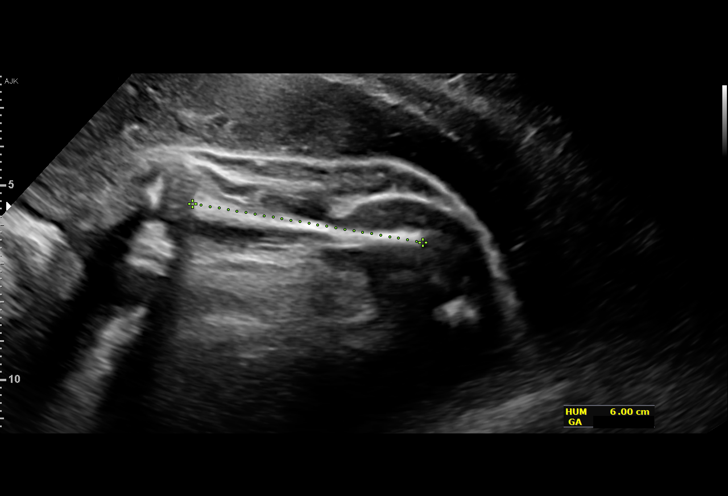
[im 28/44]
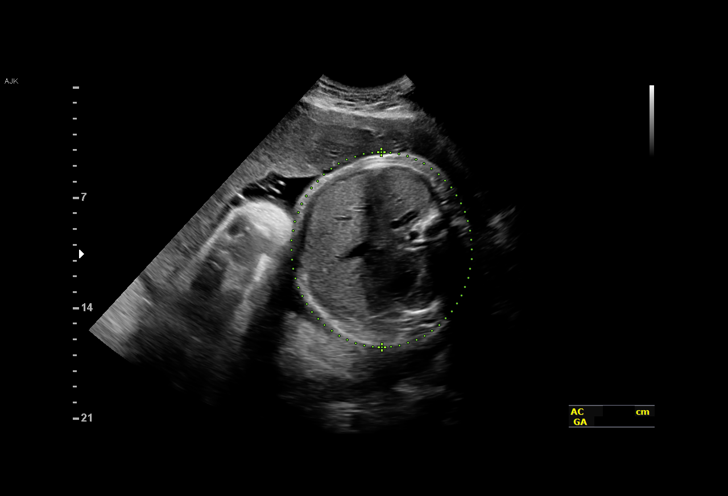
[im 31/44]
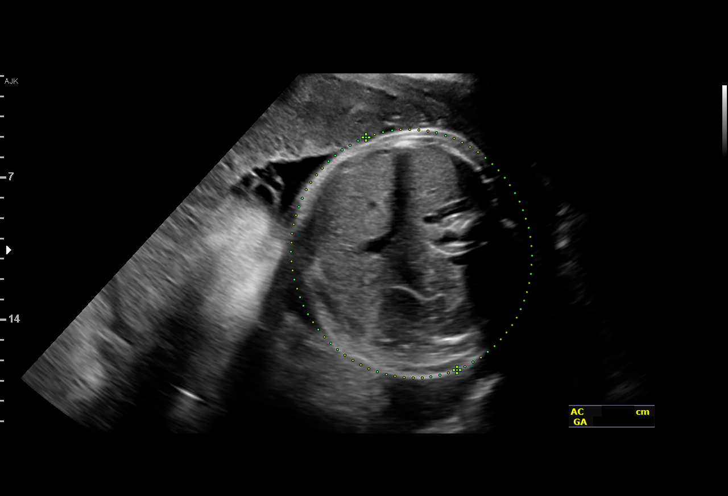
[im 34/44]
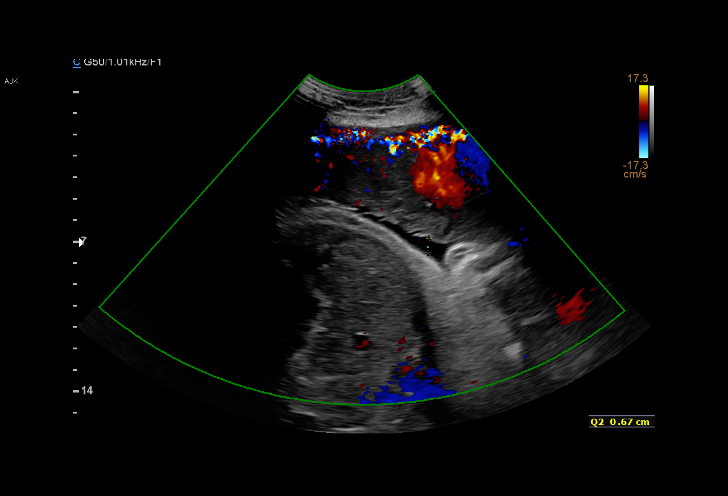
[im 37/44]
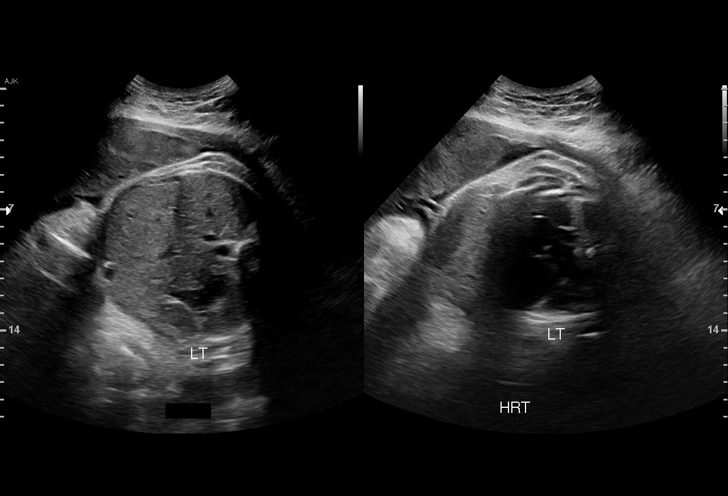
[im 40/44]
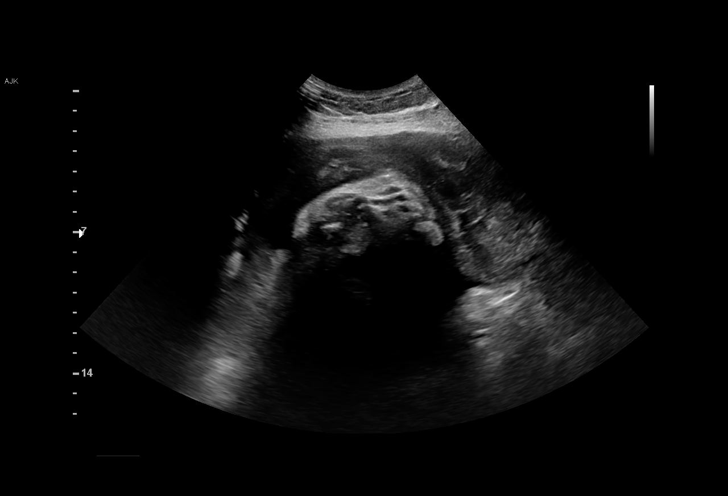
[im 44/44]
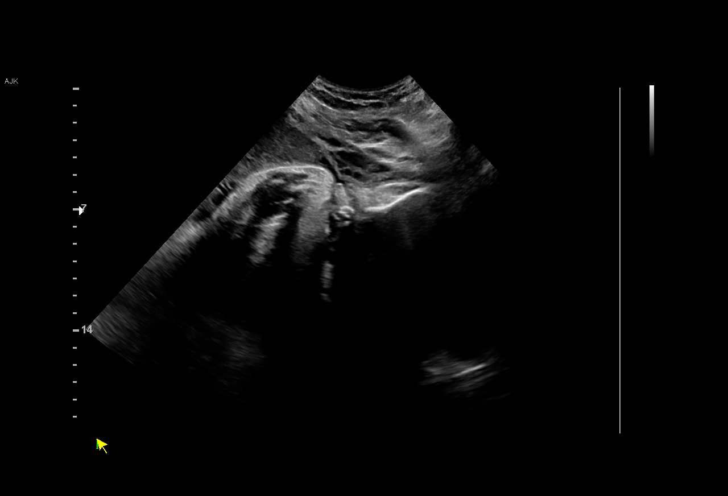

[14 of 28 positions shown; findings below may reference images not displayed]

OB/Gyn Clinic
[REDACTED]

1  MELH BOLIS           [id number]      [id number]     [id number]
Indications

39 weeks gestation of pregnancy
Encounter for other antenatal screening
follow-up
Drug use complicating pregnancy, third
trimester
Uterine size-date discrepancy, third trimester
OB History

Gravidity:    1         Term:   0        Prem:   0        SAB:   0
TOP:          0       Ectopic:  0        Living: 0
Fetal Evaluation

Num Of Fetuses:     1
Fetal Heart         148
Rate(bpm):
Cardiac Activity:   Observed
Presentation:       Cephalic
Placenta:           Anterior, above cervical os
P. Cord Insertion:  Visualized, central
Amniotic Fluid
AFI FV:      Subjectively within normal limits

AFI Sum(cm)     %Tile       Largest Pocket(cm)
6.27            < 3

RUQ(cm)       RLQ(cm)       LUQ(cm)        LLQ(cm)
3.66
Biometry

BPD:      86.8  mm     G. Age:  35w 0d          1  %    CI:        73.42   %   70 - 86
FL/HC:      22.3   %   20.7 -
HC:      321.9  mm     G. Age:  36w 3d        < 3  %    HC/AC:      0.86       0.87 -
AC:      375.9  mm     G. Age:  41w 3d       > 97  %    FL/BPD:     82.6   %   71 - 87
FL:       71.7  mm     G. Age:  36w 5d          6  %    FL/AC:      19.1   %   20 - 24
HUM:      60.3  mm     G. Age:  35w 0d        < 5  %

Est. FW:    3756  gm      8 lb 2 oz     83  %
Gestational Age

LMP:           41w 1d       Date:   08/23/15                 EDD:   05/29/16
U/S Today:     37w 3d                                        EDD:   06/24/16
Best:          39w 4d    Det. By:   Early Ultrasound         EDD:   06/09/16
(10/20/15)
Anatomy

Cranium:               Appears normal         Aortic Arch:            Previously seen
Cavum:                 Appears normal         Ductal Arch:            Previously seen
Ventricles:            Appears normal         Diaphragm:              Appears normal
Choroid Plexus:        Previously seen        Stomach:                Appears normal, left
sided
Cerebellum:            Previously seen        Abdomen:                Previously seen
Posterior Fossa:       Previously seen        Abdominal Wall:         Previously seen
Nuchal Fold:           Not applicable (>20    Cord Vessels:           Previously seen
wks GA)
Face:                  Orbits and profile     Kidneys:                Appear normal
previously seen
Lips:                  Not well visualized    Bladder:                Appears normal
Thoracic:              Previously seen        Spine:                  Previously seen
Heart:                 Appears normal         Upper Extremities:      Previously seen
(4CH, axis, and situs
RVOT:                  Previously seen        Lower Extremities:      Previously seen
LVOT:                  Previously seen

Other:  Technically difficult due to maternal habitus and fetal position.
Cervix Uterus Adnexa

Cervix
Not visualized (advanced GA >49wks)

Uterus
No abnormality visualized.

Left Ovary
Not visualized.
Right Ovary
Not visualized.

Adnexa:       No abnormality visualized.
Impression

SIUP at 39+4 weeks
Cephalic presentation
Normal interval anatomy; anatomic survey complete except
for upper lip
Low normal amniotic fluid volume
EFW at the 83rd %tile; AC > 97th %tile; 8+2; head
measurements small but not in microcephalic range (head
low in pelvis)
Recommendations

Follow-up ultrasound in one week to reassess AFV

## 2017-08-25 ENCOUNTER — Emergency Department (HOSPITAL_COMMUNITY)
Admission: EM | Admit: 2017-08-25 | Discharge: 2017-08-25 | Disposition: A | Discharge status: Home / Self Care | Payer: Medicaid Other | Attending: Emergency Medicine | Admitting: Emergency Medicine

## 2017-08-25 ENCOUNTER — Encounter (HOSPITAL_COMMUNITY): Payer: Self-pay | Admitting: Emergency Medicine

## 2017-08-25 DIAGNOSIS — R110 Nausea: Secondary | ICD-10-CM | POA: Diagnosis not present

## 2017-08-25 DIAGNOSIS — Z5321 Procedure and treatment not carried out due to patient leaving prior to being seen by health care provider: Secondary | ICD-10-CM | POA: Insufficient documentation

## 2017-08-25 LAB — URINALYSIS, ROUTINE W REFLEX MICROSCOPIC
BILIRUBIN URINE: NEGATIVE
GLUCOSE, UA: NEGATIVE mg/dL
HGB URINE DIPSTICK: NEGATIVE
KETONES UR: NEGATIVE mg/dL
LEUKOCYTES UA: NEGATIVE
Nitrite: NEGATIVE
PROTEIN: NEGATIVE mg/dL
Specific Gravity, Urine: 1.015 (ref 1.005–1.030)
pH: 5 (ref 5.0–8.0)

## 2017-08-25 LAB — COMPREHENSIVE METABOLIC PANEL
ALT: 17 U/L (ref 14–54)
AST: 19 U/L (ref 15–41)
Albumin: 3.7 g/dL (ref 3.5–5.0)
Alkaline Phosphatase: 111 U/L (ref 38–126)
Anion gap: 9 (ref 5–15)
BUN: 8 mg/dL (ref 6–20)
CHLORIDE: 105 mmol/L (ref 101–111)
CO2: 25 mmol/L (ref 22–32)
CREATININE: 0.7 mg/dL (ref 0.44–1.00)
Calcium: 8.8 mg/dL — ABNORMAL LOW (ref 8.9–10.3)
GFR calc Af Amer: >60 mL/min (ref >60)
GLUCOSE: 107 mg/dL — AB (ref 65–99)
Potassium: 3.9 mmol/L (ref 3.5–5.1)
Sodium: 139 mmol/L (ref 135–145)
Total Bilirubin: 0.5 mg/dL (ref 0.3–1.2)
Total Protein: 6.9 g/dL (ref 6.5–8.1)

## 2017-08-25 LAB — CBC
HCT: 40.6 % (ref 36.0–46.0)
Hemoglobin: 13.3 g/dL (ref 12.0–15.0)
MCH: 27 pg (ref 26.0–34.0)
MCHC: 32.8 g/dL (ref 30.0–36.0)
MCV: 82.5 fL (ref 78.0–100.0)
PLATELETS: 348 10*3/uL (ref 150–400)
RBC: 4.92 MIL/uL (ref 3.87–5.11)
RDW: 14.3 % (ref 11.5–15.5)
WBC: 10.6 10*3/uL — AB (ref 4.0–10.5)

## 2017-08-25 LAB — LIPASE, BLOOD: LIPASE: 25 U/L (ref 11–51)

## 2017-08-25 LAB — I-STAT BETA HCG BLOOD, ED (MC, WL, AP ONLY): I-stat hCG, quantitative: <5 m[IU]/mL (ref <5)

## 2017-08-25 NOTE — ED Notes (Signed)
Pt left post triage, observed to be ambulatory and no distress noted.

## 2017-08-25 NOTE — ED Triage Notes (Signed)
Patient complains of "abdominal queesiness" and nausea x3 days. Patient states she thinks she might be pregnant.

## 2017-10-08 ENCOUNTER — Ambulatory Visit (HOSPITAL_COMMUNITY)
Admission: EM | Admit: 2017-10-08 | Discharge: 2017-10-08 | Disposition: A | Discharge status: Home / Self Care | Payer: Medicaid Other | Attending: Family Medicine | Admitting: Family Medicine

## 2017-10-08 ENCOUNTER — Encounter (HOSPITAL_COMMUNITY): Payer: Self-pay | Admitting: Emergency Medicine

## 2017-10-08 DIAGNOSIS — R21 Rash and other nonspecific skin eruption: Secondary | ICD-10-CM

## 2017-10-08 DIAGNOSIS — Z3202 Encounter for pregnancy test, result negative: Secondary | ICD-10-CM | POA: Diagnosis not present

## 2017-10-08 DIAGNOSIS — N912 Amenorrhea, unspecified: Secondary | ICD-10-CM

## 2017-10-08 LAB — POCT PREGNANCY, URINE: Preg Test, Ur: NEGATIVE

## 2017-10-08 MED ORDER — PREDNISONE 20 MG PO TABS: ORAL_TABLET | ORAL | 0 refills | Status: DC

## 2017-10-08 MED ORDER — PREDNISONE 10 MG PO TABS
10.0000 mg | ORAL_TABLET | Freq: Once | ORAL | Status: AC
  Administered 2017-10-08: 10 mg via ORAL

## 2017-10-08 MED ORDER — PREDNISONE 10 MG PO TABS
ORAL_TABLET | ORAL | Status: AC
  Filled 2017-10-08: qty 1

## 2017-10-08 NOTE — ED Triage Notes (Signed)
Pt c/o rash all over body x2 days. Pt also states her last period was the end of April and its late.

## 2017-10-08 NOTE — Discharge Instructions (Addendum)
Staying in a cool environment to help with the itching  There is no pregnancy at this point

## 2017-10-08 NOTE — ED Provider Notes (Signed)
[facility]   [id number] 10/08/17 Arrival Time: 1911   SUBJECTIVE:  Melanie Cisneros is a 27 y.o. female who presents to the urgent care with complaint of rash all over body x 2 days. The only thing the patient can attribute the rash to his having had a mango fruit drink 2 days ago. She's been inside most of the time in air conditioning. The rash is very mild but pruritic and involves her entire torso and arms and legs Pt also states her last period was the end of April and it's late.    Past Medical History:  Diagnosis Date  . Bacterial vaginosis   . Chlamydia   . Gonorrhea   . Obesity   . UTI (lower urinary tract infection)    Family History  Problem Relation Age of Onset  . Hypertension Mother    Social History   Socioeconomic History  . Marital status: Single    Spouse name: Not on file  . Number of children: Not on file  . Years of education: Not on file  . Highest education level: Not on file  Occupational History  . Not on file  Social Needs  . Financial resource strain: Not on file  . Food insecurity:    Worry: Not on file    Inability: Not on file  . Transportation needs:    Medical: Not on file    Non-medical: Not on file  Tobacco Use  . Smoking status: Never Smoker  . Smokeless tobacco: Never Used  Substance and Sexual Activity  . Alcohol use: Yes    Comment: socially  . Drug use: No  . Sexual activity: Yes    Birth control/protection: None  Lifestyle  . Physical activity:    Days per week: Not on file    Minutes per session: Not on file  . Stress: Not on file  Relationships  . Social connections:    Talks on phone: Not on file    Gets together: Not on file    Attends religious service: Not on file    Active member of club or organization: Not on file    Attends meetings of clubs or organizations: Not on file    Relationship status: Not on file  . Intimate partner violence:    Fear of current or ex partner: Not on file   Emotionally abused: Not on file    Physically abused: Not on file    Forced sexual activity: Not on file  Other Topics Concern  . Not on file  Social History Narrative  . Not on file   No outpatient medications have been marked as taking for the 10/08/17 encounter [facility] Encounter).   Allergies  Allergen Reactions  . Amoxicillin Anaphylaxis, Hives and Other (See Comments)    Has patient had a PCN reaction causing immediate rash, facial/tongue/throat swelling, SOB or lightheadedness with hypotension: Yes Has patient had a PCN reaction causing severe rash involving mucus membranes or skin necrosis: No Has patient had a PCN reaction that required hospitalization No Has patient had a PCN reaction occurring within the last 10 years: No If all of the above answers are "NO", then may proceed with Cephalosporin use.  Marland Kitchen Penicillins Anaphylaxis, Hives and Other (See Comments)    Has patient had a PCN reaction causing immediate rash, facial/tongue/throat swelling, SOB or lightheadedness with hypotension: Yes Has patient had a PCN reaction causing severe rash involving mucus membranes or skin necrosis: No Has patient had a PCN  reaction that required hospitalization No Has patient had a PCN reaction occurring within the last 10 years: No If all of the above answers are "NO", then may proceed with Cephalosporin use.      ROS: As per HPI, remainder of ROS negative.   OBJECTIVE:   Vitals:   10/08/17 1932  BP: 137/85  Pulse: 73  Resp: 16  Temp: 98.2 F (36.8 C)  SpO2: 100%     General appearance: alert; no distress Eyes: PERRL; EOMI; conjunctiva normal HENT: normocephalic; atraumatic;  oral mucosa normal Neck: supple Extremities: no cyanosis or edema; symmetrical with no gross deformities Skin: warm and dry; very faint papular rash that's best seen on the posterior neck Neurologic: normal gait; grossly normal Psychological: alert and cooperative; normal mood and  affect      Labs:  Results for orders placed or performed during the hospital encounter of 08/25/17  Lipase, blood  Result Value Ref Range   Lipase 25 11 - 51 U/L  Comprehensive metabolic panel  Result Value Ref Range   Sodium 139 135 - 145 mmol/L   Potassium 3.9 3.5 - 5.1 mmol/L   Chloride 105 101 - 111 mmol/L   CO2 25 22 - 32 mmol/L   Glucose, Bld 107 (H) 65 - 99 mg/dL   BUN 8 6 - 20 mg/dL   Creatinine, Ser 0.70 0.44 - 1.00 mg/dL   Calcium 8.8 (L) 8.9 - 10.3 mg/dL   Total Protein 6.9 6.5 - 8.1 g/dL   Albumin 3.7 3.5 - 5.0 g/dL   AST 19 15 - 41 U/L   ALT 17 14 - 54 U/L   Alkaline Phosphatase 111 38 - 126 U/L   Total Bilirubin 0.5 0.3 - 1.2 mg/dL   GFR calc non Af Amer >60 >60 mL/min   GFR calc Af Amer >60 >60 mL/min   Anion gap 9 5 - 15  CBC  Result Value Ref Range   WBC 10.6 (H) 4.0 - 10.5 K/uL   RBC 4.92 3.87 - 5.11 MIL/uL   Hemoglobin 13.3 12.0 - 15.0 g/dL   HCT 40.6 36.0 - 46.0 %   MCV 82.5 78.0 - 100.0 fL   MCH 27.0 26.0 - 34.0 pg   MCHC 32.8 30.0 - 36.0 g/dL   RDW 14.3 11.5 - 15.5 %   Platelets 348 150 - 400 K/uL  Urinalysis, Routine w reflex microscopic  Result Value Ref Range   Color, Urine YELLOW YELLOW   APPearance CLEAR CLEAR   Specific Gravity, Urine 1.015 1.005 - 1.030   pH 5.0 5.0 - 8.0   Glucose, UA NEGATIVE NEGATIVE mg/dL   Hgb urine dipstick NEGATIVE NEGATIVE   Bilirubin Urine NEGATIVE NEGATIVE   Ketones, ur NEGATIVE NEGATIVE mg/dL   Protein, ur NEGATIVE NEGATIVE mg/dL   Nitrite NEGATIVE NEGATIVE   Leukocytes, UA NEGATIVE NEGATIVE  I-Stat beta hCG blood, ED  Result Value Ref Range   I-stat hCG, quantitative <5.0 <5 mIU/mL   Comment 3            Labs Reviewed  POCT PREGNANCY, URINE    No results found.     ASSESSMENT & PLAN:  1. Amenorrhea   2. Rash and nonspecific skin eruption     Meds ordered this encounter  Medications  . predniSONE (DELTASONE) 20 MG tablet    Sig: One daily with food    Dispense:  5 tablet     Refill:  0    Reviewed expectations re: course of current  medical issues. Questions answered. Outlined signs and symptoms indicating need for more acute intervention. Patient verbalized understanding. After Visit Summary given.    Procedures:      Robyn Haber, MD 10/08/17 2012

## 2017-12-26 ENCOUNTER — Other Ambulatory Visit: Payer: Self-pay

## 2017-12-26 ENCOUNTER — Ambulatory Visit (HOSPITAL_COMMUNITY)
Admission: EM | Admit: 2017-12-26 | Discharge: 2017-12-26 | Disposition: A | Discharge status: Home / Self Care | Payer: Medicaid Other | Attending: Family Medicine | Admitting: Family Medicine

## 2017-12-26 ENCOUNTER — Encounter (HOSPITAL_COMMUNITY): Payer: Self-pay | Admitting: Emergency Medicine

## 2017-12-26 DIAGNOSIS — R059 Cough, unspecified: Secondary | ICD-10-CM

## 2017-12-26 DIAGNOSIS — R05 Cough: Secondary | ICD-10-CM

## 2017-12-26 DIAGNOSIS — R195 Other fecal abnormalities: Secondary | ICD-10-CM

## 2017-12-26 DIAGNOSIS — R197 Diarrhea, unspecified: Secondary | ICD-10-CM

## 2017-12-26 DIAGNOSIS — R0982 Postnasal drip: Secondary | ICD-10-CM

## 2017-12-26 MED ORDER — CETIRIZINE HCL 10 MG PO TABS: 10.0000 mg | ORAL_TABLET | Freq: Every day | ORAL | 0 refills | Status: DC

## 2017-12-26 MED ORDER — ALBUTEROL SULFATE HFA 108 (90 BASE) MCG/ACT IN AERS
2.0000 | INHALATION_SPRAY | Freq: Once | RESPIRATORY_TRACT | Status: DC
Start: 2017-12-26 — End: 2017-12-26

## 2017-12-26 MED ORDER — ALBUTEROL SULFATE HFA 108 (90 BASE) MCG/ACT IN AERS
INHALATION_SPRAY | RESPIRATORY_TRACT | Status: AC
  Filled 2017-12-26: qty 6.7

## 2017-12-26 NOTE — ED Provider Notes (Addendum)
[place]   [id number] 12/26/17 Arrival Time: 1703  CC: Coughing  SUBJECTIVE:  Melanie Cisneros is a 27 y.o. female who presents with cough for 2 weeks ago .  Admits to positive sick exposure to daughter with similar symptoms.  Describes cough as constant and dry and productive with green sputum.  Has tried cough drops and multiple OTC cough relief medications without improvement.  Denies aggravating factors.   Reports previous symptoms in the past.  Complains of fever with tmax of 103 a week ago, 98 in office today, and rhinorrhea.   Denies chills, fatigue, sinus pain, sore throat, SOB, wheezing, chest pain, nausea, changes in bladder habits.    Patient also reports diarrhea for the past week.  Denies recent antibiotic use, c. Diff exposure, traveling.  Reports improvement in diarrhea, was initially watery, but now formed with looser stools. Has tried bland foods with temporary relief.  Denies aggravating factors.  Reports similar symptoms in the past that resolved without treatment.  Denies abdominal pain, hematochezia, or dark tarry stools.    ROS: As per HPI.  Past Medical History:  Diagnosis Date  . Bacterial vaginosis   . Chlamydia   . Gonorrhea   . Obesity   . UTI (lower urinary tract infection)    Past Surgical History:  Procedure Laterality Date  . WISDOM TOOTH EXTRACTION     Allergies  Allergen Reactions  . Amoxicillin Anaphylaxis, Hives and Other (See Comments)    Has patient had a PCN reaction causing immediate rash, facial/tongue/throat swelling, SOB or lightheadedness with hypotension: Yes Has patient had a PCN reaction causing severe rash involving mucus membranes or skin necrosis: No Has patient had a PCN reaction that required hospitalization No Has patient had a PCN reaction occurring within the last 10 years: No If all of the above answers are "NO", then may proceed with Cephalosporin use.  Marland Kitchen Penicillins Anaphylaxis, Hives and Other (See Comments)      Has patient had a PCN reaction causing immediate rash, facial/tongue/throat swelling, SOB or lightheadedness with hypotension: Yes Has patient had a PCN reaction causing severe rash involving mucus membranes or skin necrosis: No Has patient had a PCN reaction that required hospitalization No Has patient had a PCN reaction occurring within the last 10 years: No If all of the above answers are "NO", then may proceed with Cephalosporin use.   No current facility-administered medications on file prior to encounter.    No current outpatient medications on file prior to encounter.    Social History   Socioeconomic History  . Marital status: Single    Spouse name: Not on file  . Number of children: Not on file  . Years of education: Not on file  . Highest education level: Not on file  Occupational History  . Not on file  Social Needs  . Financial resource strain: Not on file  . Food insecurity:    Worry: Not on file    Inability: Not on file  . Transportation needs:    Medical: Not on file    Non-medical: Not on file  Tobacco Use  . Smoking status: Never Smoker  . Smokeless tobacco: Never Used  Substance and Sexual Activity  . Alcohol use: Yes    Comment: socially  . Drug use: No  . Sexual activity: Yes    Birth control/protection: None  Lifestyle  . Physical activity:    Days per week: Not on file    Minutes per session: Not  on file  . Stress: Not on file  Relationships  . Social connections:    Talks on phone: Not on file    Gets together: Not on file    Attends religious service: Not on file    Active member of club or organization: Not on file    Attends meetings of clubs or organizations: Not on file    Relationship status: Not on file  . Intimate partner violence:    Fear of current or ex partner: Not on file    Emotionally abused: Not on file    Physically abused: Not on file    Forced sexual activity: Not on file  Other Topics Concern  . Not on file   Social History Narrative  . Not on file   Family History  Problem Relation Age of Onset  . Hypertension Mother      OBJECTIVE:  Vitals:   12/26/17 1731  BP: 122/76  Pulse: 83  Resp: (!) 22  Temp: 98.4 F (36.9 C)  TempSrc: Oral  SpO2: 99%     General appearance: AOx3 in no acute distress; nontoxic appearance; using mobile device without difficulty HEENT: EACs clear, TM pearly gray with visible cone of light; PERRL.  EOM grossly intact.  Sinuses nontender; no obvious clear rhinorrhea; tonsils nonerythematous, uvula midline Neck: supple without LAD Lungs: clear to auscultation bilaterally without adventitious breath sounds; persistent dry cough during examination Heart: regular rate and rhythm.  Radial pulses 2+ symmetrical bilaterally Abdomen: protuberant, soft, nondistended, normal active bowel sounds; nontender to palpation; no guarding; negative murphys, nontender at [place] Skin: warm and dry Psychological: alert and cooperative; normal mood and affect   ASSESSMENT & PLAN:  1. Cough   2. Post-nasal drainage   3. Loose stools     Meds ordered this encounter  Medications  . albuterol (PROVENTIL HFA;VENTOLIN HFA) 108 (90 Base) MCG/ACT inhaler 2 puff  . cetirizine (ZYRTEC) 10 MG tablet    Sig: Take 1 tablet (10 mg total) by mouth daily for 20 days.    Dispense:  20 tablet    Refill:  0    Order Specific Question:   Supervising Provider    Answer:   Wynona Luna [[id number]]    Declines chest x-ray today Inhaler given in office.  Use as needed for shortness of breath and or wheezing Get plenty of rest and push fluids Prescribed zyrtec.  Take daily Discontinue OTC medications for mucus relief, this may be contributing to your loose stools.  Instead use zyrtec and albuterol inhaler for symptomatic relief Stick with bland food diet until symptoms resolve.  Avoid fatty or greasy foods that may make your symptoms worse.  PCP assistance initiated   Follow up  with PCP or community health and wellness next week for reevaluation.  Consider doing stool studies in the future if symptoms do not improve with time and diet changes (within the next 1- 3 days).  Return or go to ER if you have any new or worsening symptoms fever, abdominal pain, worsening cough, watery diarrhea, etc...   Reviewed expectations re: course of current medical issues. Questions answered. Outlined signs and symptoms indicating need for more acute intervention. Patient verbalized understanding. After Visit Summary given.     Lestine Box, PA-C 12/26/17 [phone]

## 2017-12-26 NOTE — Discharge Instructions (Addendum)
Declines chest x-ray today Inhaler given in office.  Use as needed for shortness of breath and or wheezing Get plenty of rest and push fluids Prescribed zyrtec.  Take daily Discontinue OTC medications for mucus relief, this may be contributing to your loose stools.  Instead use zyrtec and albuterol inhaler for symptomatic relief Stick with bland food diet until symptoms resolve.  Avoid fatty or greasy foods that may make your symptoms worse.  PCP assistance initiated   Follow up with PCP next week for reevaluation.  Consider doing stool studies in the future if symptoms do not improve with time and diet changes.   Return or go to ER if you have any new or worsening symptoms

## 2017-12-26 NOTE — ED Triage Notes (Addendum)
Coughing for 2 weeks.   Fever and diarrhea started 1- 1 1/2 weeks ago.  Pain in lower back.  Reports diarrhea every day.  Reports diarrhea did slow down some today.    Patient remains on telephone, making calls during in take process

## 2018-01-01 ENCOUNTER — Telehealth (HOSPITAL_COMMUNITY): Payer: Self-pay

## 2018-01-01 NOTE — Telephone Encounter (Signed)
Pt place of employment called concerned about scratch marks on the work not the patient gave them.  Encouraged them to have the patient come back to get another work note if they could not accept that one as I cannot give out any information to them over the phone or fax anything without the patients permission.

## 2018-03-09 ENCOUNTER — Ambulatory Visit (HOSPITAL_COMMUNITY)
Admission: EM | Admit: 2018-03-09 | Discharge: 2018-03-09 | Disposition: A | Discharge status: Home / Self Care | Payer: Medicaid Other | Attending: Emergency Medicine | Admitting: Emergency Medicine

## 2018-03-09 ENCOUNTER — Encounter (HOSPITAL_COMMUNITY): Payer: Self-pay

## 2018-03-09 DIAGNOSIS — Z880 Allergy status to penicillin: Secondary | ICD-10-CM | POA: Insufficient documentation

## 2018-03-09 DIAGNOSIS — N760 Acute vaginitis: Secondary | ICD-10-CM

## 2018-03-09 DIAGNOSIS — B9689 Other specified bacterial agents as the cause of diseases classified elsewhere: Secondary | ICD-10-CM | POA: Insufficient documentation

## 2018-03-09 MED ORDER — METRONIDAZOLE 500 MG PO TABS: 500.0000 mg | ORAL_TABLET | Freq: Two times a day (BID) | ORAL | 0 refills | Status: AC

## 2018-03-09 NOTE — ED Provider Notes (Signed)
[place]    CSN: [id number] Arrival date & time: 03/09/18  1129     History   Chief Complaint Chief Complaint  Patient presents with  . BV    HPI Melanie Cisneros is a 27 y.o. female.   Zylpha presents with complaints of vaginal discharge with odor and increased white creamy discharge. Has been ongoing for a few weeks now. States feels similar to BV she has had in the past. Denies itching. Feels mildly irritated. No bleeding. No sores or lesions. LMP 1.5 weeks ago. Had one episode of slight pelvic pain this morning but denies any current pelvic pain. No fevers. No back pain. No urinary symptoms. 1 partner, denies concerns for STDS. Hx of bv, chlamydia, gonorrhea, uti.    ROS per HPI.      Past Medical History:  Diagnosis Date  . Bacterial vaginosis   . Chlamydia   . Gonorrhea   . Obesity   . UTI (lower urinary tract infection)     Patient Active Problem List   Diagnosis Date Noted  . Indication for care in labor or delivery 06/12/2016  . BMI 45.0-49.9, adult ([place]) 06/06/2016  . Psychosocial stressors 02/25/2016  . 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 Rubella non-immune status, antepartum 01/27/2016  . Supervision of high risk pregnancy in third trimester 11/20/2015    Past Surgical History:  Procedure Laterality Date  . WISDOM TOOTH EXTRACTION      OB History    Gravida  1   Para  1   Term  1   Preterm      AB      Living  1     SAB      TAB      Ectopic      Multiple  0   Live Births  1            Home Medications    Prior to Admission medications   Medication Sig Start Date End Date Taking? Authorizing Provider  cetirizine (ZYRTEC) 10 MG tablet Take 1 tablet (10 mg total) by mouth daily for 20 days. 12/26/17 01/15/18  Wurst, Marye Round, PA-C  metroNIDAZOLE (FLAGYL) 500 MG tablet Take 1 tablet (500 mg total) by mouth 2 (two) times daily for 7 days. 03/09/18 03/16/18   Zigmund Gottron, NP    Family History Family History  Problem Relation Age of Onset  . Hypertension Mother     Social History Social History   Tobacco Use  . Smoking status: Never Smoker  . Smokeless tobacco: Never Used  Substance Use Topics  . Alcohol use: Yes    Comment: socially  . Drug use: No     Allergies   Amoxicillin and Penicillins   Review of Systems Review of Systems   Physical Exam Triage Vital Signs ED Triage Vitals  Enc Vitals Group     BP 03/09/18 1150 132/76     Pulse Rate 03/09/18 1150 90     Resp 03/09/18 1150 20     Temp 03/09/18 1150 (!) 97.5 F (36.4 C)     Temp Source 03/09/18 1150 Oral     SpO2 03/09/18 1150 94 %     Weight --      Height --      Head Circumference --      Peak Flow --      Pain Score 03/09/18 1151 4     Pain Loc --  Pain Edu? --      Excl. in [place]? --    No data found.  Updated Vital Signs BP 132/76 (BP Location: Left Arm)   Pulse 90   Temp (!) 97.5 F (36.4 C) (Oral)   Resp 20   SpO2 94%    Physical Exam  Constitutional: She is oriented to person, place, and time. She appears well-developed and well-nourished. No distress.  Cardiovascular: Normal rate, regular rhythm and normal heart sounds.  Pulmonary/Chest: Effort normal and breath sounds normal.  Abdominal: Soft. There is no tenderness. There is no rigidity, no rebound, no guarding and no CVA tenderness.  Genitourinary:  Genitourinary Comments: Denies sores, lesions, vaginal bleeding; no pelvic pain; gu exam deferred at this time, vaginal self swab collected.    Neurological: She is alert and oriented to person, place, and time.  Skin: Skin is warm and dry.     UC Treatments / Results  Labs (all labs ordered are listed, but only abnormal results are displayed) Labs Reviewed  CERVICOVAGINAL ANCILLARY ONLY    EKG None  Radiology No results found.  Procedures Procedures (including critical care time)  Medications Ordered in  UC Medications - No data to display  Initial Impression / Assessment and Plan / UC Course  I have reviewed the triage vital signs and the nursing notes.  Pertinent labs & imaging results that were available during my care of the patient were reviewed by me and considered in my medical decision making (see chart for details).     Non toxic. Afebrile. No abdominal pain or bleeding. Vaginal cytology pending. Will notify of any positive findings and if any changes to treatment are needed.  Flagyl initiated. If symptoms worsen or do not improve in the next week to return to be seen or to follow up with PCP.  Patient verbalized understanding and agreeable to plan.   Final Clinical Impressions(s) / UC Diagnoses   Final diagnoses:  Acute vaginitis     Discharge Instructions     We will start treatment for BV as your swab is pending.  Will notify you of any positive findings and if any changes to treatment are needed.  You may monitor your results on your MyChart online as well.   Please withhold from intercourse for the next week. Don't drink alcohol while taking medication.  If symptoms worsen or do not improve in the next week to return to be seen or to follow up with your PCP and/or gynecologist.    ED Prescriptions    Medication Sig Dispense Auth. Provider   metroNIDAZOLE (FLAGYL) 500 MG tablet Take 1 tablet (500 mg total) by mouth 2 (two) times daily for 7 days. 14 tablet Zigmund Gottron, NP     Controlled Substance Prescriptions [place] Controlled Substance Registry consulted? Not Applicable   Zigmund Gottron, NP 03/09/18 1235

## 2018-03-09 NOTE — ED Triage Notes (Signed)
Pt presents with vaginal irritation, discomfort, and discharge and odor.  Pt states she is also having some abdominal cramping.

## 2018-03-09 NOTE — Discharge Instructions (Signed)
We will start treatment for BV as your swab is pending.  Will notify you of any positive findings and if any changes to treatment are needed.  You may monitor your results on your MyChart online as well.   Please withhold from intercourse for the next week. Don't drink alcohol while taking medication.  If symptoms worsen or do not improve in the next week to return to be seen or to follow up with your PCP and/or gynecologist.

## 2018-03-13 LAB — CERVICOVAGINAL ANCILLARY ONLY
Bacterial vaginitis: POSITIVE — AB
CANDIDA VAGINITIS: NEGATIVE
Chlamydia: NEGATIVE
Neisseria Gonorrhea: NEGATIVE
Trichomonas: NEGATIVE

## 2018-03-14 ENCOUNTER — Telehealth (HOSPITAL_COMMUNITY): Payer: Self-pay | Admitting: Emergency Medicine

## 2018-07-24 ENCOUNTER — Encounter (HOSPITAL_COMMUNITY): Payer: Self-pay

## 2018-07-24 ENCOUNTER — Other Ambulatory Visit: Payer: Self-pay

## 2018-07-24 ENCOUNTER — Ambulatory Visit (HOSPITAL_COMMUNITY)
Admission: EM | Admit: 2018-07-24 | Discharge: 2018-07-24 | Disposition: A | Discharge status: Home / Self Care | Payer: Medicaid Other | Attending: Family Medicine | Admitting: Family Medicine

## 2018-07-24 ENCOUNTER — Telehealth (HOSPITAL_COMMUNITY): Payer: Self-pay | Admitting: Emergency Medicine

## 2018-07-24 DIAGNOSIS — Z3202 Encounter for pregnancy test, result negative: Secondary | ICD-10-CM

## 2018-07-24 DIAGNOSIS — R8281 Pyuria: Secondary | ICD-10-CM | POA: Insufficient documentation

## 2018-07-24 DIAGNOSIS — N898 Other specified noninflammatory disorders of vagina: Secondary | ICD-10-CM | POA: Insufficient documentation

## 2018-07-24 LAB — POCT URINALYSIS DIP (DEVICE)
BILIRUBIN URINE: NEGATIVE
Glucose, UA: NEGATIVE mg/dL
Hgb urine dipstick: NEGATIVE
Nitrite: NEGATIVE
PH: 5.5 (ref 5.0–8.0)
PROTEIN: NEGATIVE mg/dL
SPECIFIC GRAVITY, URINE: 1.025 (ref 1.005–1.030)
Urobilinogen, UA: 0.2 mg/dL (ref 0.0–1.0)

## 2018-07-24 LAB — POCT PREGNANCY, URINE: PREG TEST UR: NEGATIVE

## 2018-07-24 MED ORDER — FLUCONAZOLE 150 MG PO TABS: 150.0000 mg | ORAL_TABLET | Freq: Once | ORAL | 0 refills | Status: AC

## 2018-07-24 MED ORDER — NITROFURANTOIN MONOHYD MACRO 100 MG PO CAPS: 100.0000 mg | ORAL_CAPSULE | Freq: Two times a day (BID) | ORAL | 0 refills | Status: DC

## 2018-07-24 MED ORDER — FLUCONAZOLE 150 MG PO TABS: 150.0000 mg | ORAL_TABLET | Freq: Once | ORAL | 0 refills | Status: DC

## 2018-07-24 NOTE — Telephone Encounter (Signed)
Pharmacy change

## 2018-07-24 NOTE — ED Provider Notes (Addendum)
[place]    CSN: [id number] Arrival date & time: 07/24/18  1530     History   Chief Complaint Chief Complaint  Patient presents with  . Urinary Tract Infection    HPI Melanie Cisneros is a 28 y.o. female.   Pt thinks she has a UTI  X 3 days. Pt has a vaginal discharge.   Patient's last menstrual period was 3 weeks ago.  She is gravida 1 para 8 with a 60-year-old daughter.  Patient works at [employer].  Patient is having unprotected intercourse.  She has had the same partner for over 6 months.  Patient notes disagreeable odor, urinary frequency.  The symptoms of been ongoing for about a week.`     Past Medical History:  Diagnosis Date  . Bacterial vaginosis   . Chlamydia   . Gonorrhea   . Obesity   . UTI (lower urinary tract infection)     Patient Active Problem List   Diagnosis Date Noted  . Indication for care in labor or delivery 06/12/2016  . BMI 45.0-49.9, adult ([place]) 06/06/2016  . Psychosocial stressors 02/25/2016  . 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 Rubella non-immune status, antepartum 01/27/2016  . Supervision of high risk pregnancy in third trimester 11/20/2015    Past Surgical History:  Procedure Laterality Date  . WISDOM TOOTH EXTRACTION      OB History    Gravida  1   Para  1   Term  1   Preterm      AB      Living  1     SAB      TAB      Ectopic      Multiple  0   Live Births  1            Home Medications    Prior to Admission medications   Medication Sig Start Date End Date Taking? Authorizing Provider  fluconazole (DIFLUCAN) 150 MG tablet Take 1 tablet (150 mg total) by mouth once for 1 dose. Repeat if needed 07/24/18 07/24/18  Robyn Haber, MD  nitrofurantoin, macrocrystal-monohydrate, (MACROBID) 100 MG capsule Take 1 capsule (100 mg total) by mouth 2 (two) times daily. 07/24/18   Robyn Haber, MD    Family History Family History  Problem  Relation Age of Onset  . Hypertension Mother     Social History Social History   Tobacco Use  . Smoking status: Never Smoker  . Smokeless tobacco: Never Used  Substance Use Topics  . Alcohol use: Yes    Comment: socially  . Drug use: No     Allergies   Amoxicillin and Penicillins   Review of Systems Review of Systems   Physical Exam Triage Vital Signs ED Triage Vitals  Enc Vitals Group     BP --      Pulse Rate 07/24/18 1607 81     Resp 07/24/18 1607 15     Temp 07/24/18 1607 98.1 F (36.7 C)     Temp Source 07/24/18 1607 Oral     SpO2 07/24/18 1607 100 %     Weight 07/24/18 1609 270 lb (122.5 kg)     Height --      Head Circumference --      Peak Flow --      Pain Score 07/24/18 1609 6     Pain Loc --      Pain Edu? --  Excl. in [place]? --    No data found.  Updated Vital Signs Pulse 81   Temp 98.1 F (36.7 C) (Oral)   Resp 15   Wt 122.5 kg   SpO2 100%   BMI 52.73 kg/m    Physical Exam Vitals signs and nursing note reviewed.  Constitutional:      General: She is not in acute distress.    Appearance: Normal appearance. She is obese. She is not toxic-appearing.  HENT:     Head: Normocephalic and atraumatic.     Mouth/Throat:     Mouth: Mucous membranes are moist.  Eyes:     Conjunctiva/sclera: Conjunctivae normal.  Neck:     Musculoskeletal: Normal range of motion and neck supple.  Pulmonary:     Effort: Pulmonary effort is normal.  Abdominal:     Palpations: Abdomen is soft.     Tenderness: There is no abdominal tenderness.  Musculoskeletal: Normal range of motion.  Skin:    General: Skin is warm and dry.  Neurological:     General: No focal deficit present.     Mental Status: She is alert.  Psychiatric:        Mood and Affect: Mood normal.        Behavior: Behavior normal.        Thought Content: Thought content normal.      UC Treatments / Results  Labs (all labs ordered are listed, but only abnormal results are  displayed) Labs Reviewed  POCT URINALYSIS DIP (DEVICE) - Abnormal; Notable for the following components:      Result Value   Ketones, ur TRACE (*)    Leukocytes,Ua SMALL (*)    All other components within normal limits  URINE CULTURE  POC URINE PREG, ED  POCT PREGNANCY, URINE  CERVICOVAGINAL ANCILLARY ONLY    EKG None  Radiology No results found.  Procedures Procedures (including critical care time)  Medications Ordered in UC Medications - No data to display  Initial Impression / Assessment and Plan / UC Course  I have reviewed the triage vital signs and the nursing notes.  Pertinent labs & imaging results that were available during my care of the patient were reviewed by me and considered in my medical decision making (see chart for details).  Clinical Course as of Jul 23 1699  Tue Jul 24, 2018  1650 POCT Urinalysis, Dipstick [KL]    Clinical Course User Index [KL] Robyn Haber, MD   Final Clinical Impressions(s) / UC Diagnoses   Final diagnoses:  Pyuria  Vaginal discharge   Discharge Instructions   None    ED Prescriptions    Medication Sig Dispense Auth. Provider   nitrofurantoin, macrocrystal-monohydrate, (MACROBID) 100 MG capsule Take 1 capsule (100 mg total) by mouth 2 (two) times daily. 20 capsule Robyn Haber, MD   fluconazole (DIFLUCAN) 150 MG tablet Take 1 tablet (150 mg total) by mouth once for 1 dose. Repeat if needed 2 tablet Robyn Haber, MD     Controlled Substance Prescriptions [place] Controlled Substance Registry consulted? Not Applicable   Robyn Haber, MD 07/24/18 1658    Robyn Haber, MD 07/24/18 [phone]

## 2018-07-24 NOTE — ED Triage Notes (Signed)
Pt thinks she has a UTI  X 3 days. Pt has a vaginal discharge.

## 2018-07-25 LAB — CERVICOVAGINAL ANCILLARY ONLY
Bacterial vaginitis: POSITIVE — AB
Candida vaginitis: NEGATIVE
Chlamydia: NEGATIVE
Neisseria Gonorrhea: NEGATIVE
Trichomonas: NEGATIVE

## 2018-07-26 ENCOUNTER — Telehealth (HOSPITAL_COMMUNITY): Payer: Self-pay | Admitting: Emergency Medicine

## 2018-07-26 LAB — URINE CULTURE

## 2018-07-26 MED ORDER — METRONIDAZOLE 500 MG PO TABS: 500.0000 mg | ORAL_TABLET | Freq: Two times a day (BID) | ORAL | 0 refills | Status: AC

## 2018-07-26 NOTE — Telephone Encounter (Signed)
Bacterial vaginosis is positive. This was not treated at the urgent care visit.  Flagyl 500 mg BID x 7 days #14 no refills sent to patients pharmacy of choice.    Patient contacted and made aware of all results, all questions answered.  

## 2018-08-13 ENCOUNTER — Encounter (HOSPITAL_COMMUNITY): Payer: Self-pay | Admitting: Emergency Medicine

## 2018-08-13 ENCOUNTER — Ambulatory Visit (HOSPITAL_COMMUNITY)
Admission: EM | Admit: 2018-08-13 | Discharge: 2018-08-13 | Disposition: A | Discharge status: Home / Self Care | Payer: Medicaid Other | Attending: Family Medicine | Admitting: Family Medicine

## 2018-08-13 ENCOUNTER — Other Ambulatory Visit: Payer: Self-pay

## 2018-08-13 DIAGNOSIS — N760 Acute vaginitis: Secondary | ICD-10-CM | POA: Insufficient documentation

## 2018-08-13 NOTE — ED Provider Notes (Signed)
[place]    CSN: [id number] Arrival date & time: 08/13/18  1729     History   Chief Complaint Chief Complaint  Patient presents with  . Vaginal Discharge    HPI Melanie Cisneros is a 28 y.o. female.   Melanie Cisneros presents with complaints of vaginal irritation which started today. States was seen here earlier in the month and was found to have BV. Had vaginal discharge at that time. Symptoms improved with treatment. Denies any vaginal discharge or odor currently. Had intercourse two days ago with her partner which she feels triggered the irritation. Only 1 partner and denies any concerns for any STI's. No urinary symptoms. Denies sores, lesions, open skin or ulcerations. LMp was approximately 2 weeks ago. Hx of irregular periods. No pelvic pain. No nausea or vomiting.    ROS per HPI, negative if not otherwise mentioned.      Past Medical History:  Diagnosis Date  . Bacterial vaginosis   . Chlamydia   . Gonorrhea   . Obesity   . UTI (lower urinary tract infection)     Patient Active Problem List   Diagnosis Date Noted  . Indication for care in labor or delivery 06/12/2016  . BMI 45.0-49.9, adult ([place]) 06/06/2016  . Psychosocial stressors 02/25/2016  . 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 Rubella non-immune status, antepartum 01/27/2016  . Supervision of high risk pregnancy in third trimester 11/20/2015    Past Surgical History:  Procedure Laterality Date  . WISDOM TOOTH EXTRACTION      OB History    Gravida  1   Para  1   Term  1   Preterm      AB      Living  1     SAB      TAB      Ectopic      Multiple  0   Live Births  1            Home Medications    Prior to Admission medications   Medication Sig Start Date End Date Taking? Authorizing Provider  nitrofurantoin, macrocrystal-monohydrate, (MACROBID) 100 MG capsule Take 1 capsule (100 mg total) by mouth 2 (two) times  daily. Patient not taking: Reported on 08/13/2018 07/24/18   Robyn Haber, MD    Family History Family History  Problem Relation Age of Onset  . Hypertension Mother     Social History Social History   Tobacco Use  . Smoking status: Never Smoker  . Smokeless tobacco: Never Used  Substance Use Topics  . Alcohol use: Yes    Comment: socially  . Drug use: No     Allergies   Amoxicillin and Penicillins   Review of Systems Review of Systems   Physical Exam Triage Vital Signs ED Triage Vitals  Enc Vitals Group     BP 08/13/18 1834 126/85     Pulse Rate 08/13/18 1834 77     Resp 08/13/18 1834 18     Temp 08/13/18 1834 98.3 F (36.8 C)     Temp Source 08/13/18 1834 Oral     SpO2 08/13/18 1834 99 %     Weight --      Height --      Head Circumference --      Peak Flow --      Pain Score 08/13/18 1835 4     Pain Loc --      Pain  Edu? --      Excl. in [place]? --    No data found.  Updated Vital Signs BP 126/85 (BP Location: Right Arm)   Pulse 77   Temp 98.3 F (36.8 C) (Oral)   Resp 18   SpO2 99%   Visual Acuity Right Eye Distance:   Left Eye Distance:   Bilateral Distance:    Right Eye Near:   Left Eye Near:    Bilateral Near:     Physical Exam Constitutional:      General: She is not in acute distress.    Appearance: She is well-developed.  Cardiovascular:     Rate and Rhythm: Normal rate and regular rhythm.     Heart sounds: Normal heart sounds.  Pulmonary:     Effort: Pulmonary effort is normal.     Breath sounds: Normal breath sounds.  Abdominal:     Palpations: Abdomen is soft. Abdomen is not rigid.     Tenderness: There is no abdominal tenderness. There is no guarding or rebound.  Genitourinary:    Comments: Denies sores, lesions, vaginal bleeding; no pelvic pain; gu exam deferred at this time, vaginal self swab collected.   Skin:    General: Skin is warm and dry.  Neurological:     Mental Status: She is alert and oriented to person,  place, and time.      UC Treatments / Results  Labs (all labs ordered are listed, but only abnormal results are displayed) Labs Reviewed  CERVICOVAGINAL ANCILLARY ONLY    EKG None  Radiology No results found.  Procedures Procedures (including critical care time)  Medications Ordered in UC Medications - No data to display  Initial Impression / Assessment and Plan / UC Course  I have reviewed the triage vital signs and the nursing notes.  Pertinent labs & imaging results that were available during my care of the patient were reviewed by me and considered in my medical decision making (see chart for details).     Declines pelvic exam today. Vaginal irritation two weeks after treated for BV. No discharge or odor. Vaginal cytology self swab collected and pending. Will wait to treat at this time pending results. Will notify of any positive findings and if any changes to treatment are needed.  Patient verbalized understanding and agreeable to plan.    Final Clinical Impressions(s) / UC Diagnoses   Final diagnoses:  Acute vaginitis     Discharge Instructions     Will notify you of any positive findings and if any changes to treatment are needed.   You may monitor your results on your MyChart online as well.     ED Prescriptions    None     Controlled Substance Prescriptions [place] Controlled Substance Registry consulted? Not Applicable   Zigmund Gottron, NP 08/13/18 [phone]

## 2018-08-13 NOTE — ED Triage Notes (Signed)
Pt sts vaginal irritation with hx of BV that feels similar

## 2018-08-13 NOTE — Discharge Instructions (Signed)
Will notify you of any positive findings and if any changes to treatment are needed.   You may monitor your results on your MyChart online as well.

## 2018-08-14 LAB — CERVICOVAGINAL ANCILLARY ONLY
Bacterial vaginitis: NEGATIVE
CANDIDA VAGINITIS: POSITIVE — AB
Chlamydia: NEGATIVE
Neisseria Gonorrhea: NEGATIVE
TRICH (WINDOWPATH): NEGATIVE

## 2018-08-16 ENCOUNTER — Telehealth (HOSPITAL_COMMUNITY): Payer: Self-pay | Admitting: Emergency Medicine

## 2018-08-16 MED ORDER — FLUCONAZOLE 150 MG PO TABS: 150.0000 mg | ORAL_TABLET | Freq: Once | ORAL | 0 refills | Status: AC

## 2018-08-16 NOTE — Telephone Encounter (Signed)
Test for candida (yeast) was positive.  Prescription for fluconazole 150mg  po now, repeat dose in 3d if needed, #2 no refills, sent to the pharmacy of record.  Recheck or followup with PCP for further evaluation if symptoms are not improving.    Patient contacted and made aware of all results, all questions answered.

## 2018-09-17 ENCOUNTER — Other Ambulatory Visit: Payer: Self-pay

## 2018-09-17 ENCOUNTER — Ambulatory Visit (HOSPITAL_COMMUNITY)
Admission: EM | Admit: 2018-09-17 | Discharge: 2018-09-17 | Disposition: A | Discharge status: Home / Self Care | Payer: Medicaid Other | Attending: Family Medicine | Admitting: Family Medicine

## 2018-09-17 ENCOUNTER — Encounter (HOSPITAL_COMMUNITY): Payer: Self-pay

## 2018-09-17 DIAGNOSIS — N760 Acute vaginitis: Secondary | ICD-10-CM | POA: Insufficient documentation

## 2018-09-17 DIAGNOSIS — R350 Frequency of micturition: Secondary | ICD-10-CM | POA: Insufficient documentation

## 2018-09-17 DIAGNOSIS — Z3202 Encounter for pregnancy test, result negative: Secondary | ICD-10-CM

## 2018-09-17 LAB — POCT URINALYSIS DIP (DEVICE)
Bilirubin Urine: NEGATIVE
Glucose, UA: NEGATIVE mg/dL
Hgb urine dipstick: NEGATIVE
Ketones, ur: NEGATIVE mg/dL
Leukocytes,Ua: NEGATIVE
Nitrite: NEGATIVE
Protein, ur: NEGATIVE mg/dL
Specific Gravity, Urine: >=1.03 (ref 1.005–1.030)
Urobilinogen, UA: 0.2 mg/dL (ref 0.0–1.0)
pH: 5.5 (ref 5.0–8.0)

## 2018-09-17 LAB — POCT PREGNANCY, URINE: Preg Test, Ur: NEGATIVE

## 2018-09-17 MED ORDER — METRONIDAZOLE 500 MG PO TABS: 500.0000 mg | ORAL_TABLET | Freq: Two times a day (BID) | ORAL | 0 refills | Status: DC

## 2018-09-17 MED ORDER — FLUCONAZOLE 150 MG PO TABS: 150.0000 mg | ORAL_TABLET | Freq: Every day | ORAL | 0 refills | Status: DC

## 2018-09-17 MED ORDER — METRONIDAZOLE 0.75 % VA GEL: 1.0000 | Freq: Every day | VAGINAL | 0 refills | Status: AC

## 2018-09-17 NOTE — ED Provider Notes (Signed)
[place]    CSN: [id number] Arrival date & time: 09/17/18  1544     History   Chief Complaint Chief Complaint  Patient presents with  . Urinary Tract Infection    HPI Melanie Cisneros is a 28 y.o. female.   28 year old female comes in for 1 week history of urinary and vaginal symptoms.  She has had urinary frequency, pressure with urination.  Denies dysuria, hematuria.  She has vaginal discharge with odor, vaginal irritation.  Denies spotting.  Has left lower quadrant pain that is intermittent, cramping/stabbing in sensation, no obvious aggravating or alleviating factor.  Denies nausea or vomiting.  Denies fever, chills, night sweats.  Is not currently sexually active, last sexual activity 2 months ago.  LMP 09/01/2018.     Past Medical History:  Diagnosis Date  . Bacterial vaginosis   . Chlamydia   . Gonorrhea   . Obesity   . UTI (lower urinary tract infection)     Patient Active Problem List   Diagnosis Date Noted  . Indication for care in labor or delivery 06/12/2016  . BMI 45.0-49.9, adult ([place]) 06/06/2016  . Psychosocial stressors 02/25/2016  . 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 Rubella non-immune status, antepartum 01/27/2016  . Supervision of high risk pregnancy in third trimester 11/20/2015    Past Surgical History:  Procedure Laterality Date  . WISDOM TOOTH EXTRACTION      OB History    Gravida  1   Para  1   Term  1   Preterm      AB      Living  1     SAB      TAB      Ectopic      Multiple  0   Live Births  1            Home Medications    Prior to Admission medications   Medication Sig Start Date End Date Taking? Authorizing Provider  fluconazole (DIFLUCAN) 150 MG tablet Take 1 tablet (150 mg total) by mouth daily. Take second dose 72 hours later if symptoms still persists. 09/17/18   Tasia Catchings, Amy V, PA-C  metroNIDAZOLE (FLAGYL) 500 MG tablet Take 1 tablet (500 mg  total) by mouth 2 (two) times daily. 09/17/18   Tasia Catchings, Amy V, PA-C  metroNIDAZOLE (METROGEL VAGINAL) 0.75 % vaginal gel Place 1 Applicatorful vaginally at bedtime for 5 days. 09/17/18 09/22/18  Ok Edwards, PA-C    Family History Family History  Problem Relation Age of Onset  . Hypertension Mother     Social History Social History   Tobacco Use  . Smoking status: Never Smoker  . Smokeless tobacco: Never Used  Substance Use Topics  . Alcohol use: Yes    Comment: socially  . Drug use: No     Allergies   Amoxicillin and Penicillins   Review of Systems Review of Systems  Reason unable to perform ROS: See HPI as above.     Physical Exam Triage Vital Signs ED Triage Vitals  Enc Vitals Group     BP 09/17/18 1623 126/87     Pulse Rate 09/17/18 1623 87     Resp 09/17/18 1623 16     Temp 09/17/18 1623 98.1 F (36.7 C)     Temp Source 09/17/18 1623 Oral     SpO2 09/17/18 1623 99 %     Weight 09/17/18 1624 270 lb (122.5 kg)  Height --      Head Circumference --      Peak Flow --      Pain Score 09/17/18 1624 3     Pain Loc --      Pain Edu? --      Excl. in [place]? --    No data found.  Updated Vital Signs BP 126/87 (BP Location: Left Arm)   Pulse 87   Temp 98.1 F (36.7 C) (Oral)   Resp 16   Wt 270 lb (122.5 kg)   SpO2 99%   BMI 52.73 kg/m   Physical Exam Constitutional:      General: She is not in acute distress.    Appearance: She is well-developed. She is not ill-appearing, toxic-appearing or diaphoretic.  HENT:     Head: Normocephalic and atraumatic.  Eyes:     Conjunctiva/sclera: Conjunctivae normal.     Pupils: Pupils are equal, round, and reactive to light.  Cardiovascular:     Rate and Rhythm: Normal rate and regular rhythm.     Heart sounds: Normal heart sounds. No murmur. No friction rub. No gallop.   Pulmonary:     Effort: Pulmonary effort is normal.     Breath sounds: Normal breath sounds. No wheezing or rales.  Abdominal:     General: Bowel  sounds are normal.     Palpations: Abdomen is soft.     Tenderness: There is no abdominal tenderness. There is no right CVA tenderness, left CVA tenderness, guarding or rebound.  Skin:    General: Skin is warm and dry.  Neurological:     Mental Status: She is alert and oriented to person, place, and time.  Psychiatric:        Behavior: Behavior normal.        Judgment: Judgment normal.      UC Treatments / Results  Labs (all labs ordered are listed, but only abnormal results are displayed) Labs Reviewed  POC URINE PREG, ED  POCT URINALYSIS DIP (DEVICE)  POCT PREGNANCY, URINE  CERVICOVAGINAL ANCILLARY ONLY    EKG None  Radiology No results found.  Procedures Procedures (including critical care time)  Medications Ordered in UC Medications - No data to display  Initial Impression / Assessment and Plan / UC Course  I have reviewed the triage vital signs and the nursing notes.  Pertinent labs & imaging results that were available during my care of the patient were reviewed by me and considered in my medical decision making (see chart for details).    Urine negative for pregnancy, infection.  Empiric treatment for BV with Flagyl or MetroGel.  Diflucan to cover for yeast.  Cytology sent.  Push fluids.  Return precautions given.  Patient expresses understanding and agrees to plan.  Final Clinical Impressions(s) / UC Diagnoses   Final diagnoses:  Vaginitis and vulvovaginitis  Urinary frequency    ED Prescriptions    Medication Sig Dispense Auth. Provider   metroNIDAZOLE (FLAGYL) 500 MG tablet Take 1 tablet (500 mg total) by mouth 2 (two) times daily. 14 tablet Yu, Amy V, PA-C   metroNIDAZOLE (METROGEL VAGINAL) 0.75 % vaginal gel Place 1 Applicatorful vaginally at bedtime for 5 days. 50 g Yu, Amy V, PA-C   fluconazole (DIFLUCAN) 150 MG tablet Take 1 tablet (150 mg total) by mouth daily. Take second dose 72 hours later if symptoms still persists. 2 tablet Tobin Chad, PA-C 09/17/18 [phone]

## 2018-09-17 NOTE — ED Triage Notes (Signed)
Pt cc she thinks she has a UTI. Pt states this has been going on for a week.

## 2018-09-17 NOTE — Discharge Instructions (Signed)
Urine negative for infection, pregnancy. Start flagyl or metrogel for BV. Diflucan for yeast if you take flagyl pills. Cytology sent, you will be contacted with any positive results that requires further treatment. Refrain from sexual activity and alcohol use for the next 7 days. Monitor for any worsening of symptoms, fever, abdominal pain, nausea, vomiting, to follow up for reevaluation.

## 2018-09-18 LAB — CERVICOVAGINAL ANCILLARY ONLY
Chlamydia: NEGATIVE
Neisseria Gonorrhea: NEGATIVE
Trichomonas: NEGATIVE

## 2018-10-02 ENCOUNTER — Telehealth: Payer: Self-pay | Admitting: Student

## 2018-10-02 ENCOUNTER — Other Ambulatory Visit: Payer: Self-pay

## 2018-10-02 ENCOUNTER — Encounter (HOSPITAL_COMMUNITY): Payer: Self-pay | Admitting: Radiology

## 2018-10-02 ENCOUNTER — Emergency Department (HOSPITAL_COMMUNITY)
Admission: EM | Admit: 2018-10-02 | Discharge: 2018-10-02 | Disposition: A | Discharge status: Home / Self Care | Payer: Self-pay | Attending: Emergency Medicine | Admitting: Emergency Medicine

## 2018-10-02 ENCOUNTER — Emergency Department (HOSPITAL_COMMUNITY): Payer: Self-pay

## 2018-10-02 DIAGNOSIS — N3000 Acute cystitis without hematuria: Secondary | ICD-10-CM | POA: Insufficient documentation

## 2018-10-02 DIAGNOSIS — N938 Other specified abnormal uterine and vaginal bleeding: Secondary | ICD-10-CM | POA: Insufficient documentation

## 2018-10-02 DIAGNOSIS — Z79899 Other long term (current) drug therapy: Secondary | ICD-10-CM | POA: Insufficient documentation

## 2018-10-02 DIAGNOSIS — R110 Nausea: Secondary | ICD-10-CM | POA: Insufficient documentation

## 2018-10-02 LAB — CBC WITH DIFFERENTIAL/PLATELET
Abs Immature Granulocytes: 0.06 10*3/uL (ref 0.00–0.07)
Basophils Absolute: 0.1 10*3/uL (ref 0.0–0.1)
Basophils Relative: 1 %
Eosinophils Absolute: 0.2 10*3/uL (ref 0.0–0.5)
Eosinophils Relative: 1 %
HCT: 42.1 % (ref 36.0–46.0)
Hemoglobin: 13.5 g/dL (ref 12.0–15.0)
Immature Granulocytes: 0 %
Lymphocytes Relative: 15 %
Lymphs Abs: 2.1 10*3/uL (ref 0.7–4.0)
MCH: 27.5 pg (ref 26.0–34.0)
MCHC: 32.1 g/dL (ref 30.0–36.0)
MCV: 85.7 fL (ref 80.0–100.0)
Monocytes Absolute: 1.1 10*3/uL — ABNORMAL HIGH (ref 0.1–1.0)
Monocytes Relative: 7 %
Neutro Abs: 10.7 10*3/uL — ABNORMAL HIGH (ref 1.7–7.7)
Neutrophils Relative %: 76 %
Platelets: 343 10*3/uL (ref 150–400)
RBC: 4.91 MIL/uL (ref 3.87–5.11)
RDW: 14.2 % (ref 11.5–15.5)
WBC: 14.2 10*3/uL — ABNORMAL HIGH (ref 4.0–10.5)
nRBC: 0 % (ref 0.0–0.2)

## 2018-10-02 LAB — COMPREHENSIVE METABOLIC PANEL
ALT: 18 U/L (ref 0–44)
AST: 20 U/L (ref 15–41)
Albumin: 4.2 g/dL (ref 3.5–5.0)
Alkaline Phosphatase: 92 U/L (ref 38–126)
Anion gap: 11 (ref 5–15)
BUN: 8 mg/dL (ref 6–20)
CO2: 22 mmol/L (ref 22–32)
Calcium: 8.9 mg/dL (ref 8.9–10.3)
Chloride: 108 mmol/L (ref 98–111)
Creatinine, Ser: 0.74 mg/dL (ref 0.44–1.00)
GFR calc Af Amer: >60 mL/min (ref >60)
GFR calc non Af Amer: >60 mL/min (ref >60)
Glucose, Bld: 81 mg/dL (ref 70–99)
Potassium: 3.7 mmol/L (ref 3.5–5.1)
Sodium: 141 mmol/L (ref 135–145)
Total Bilirubin: 0.5 mg/dL (ref 0.3–1.2)
Total Protein: 7.8 g/dL (ref 6.5–8.1)

## 2018-10-02 LAB — URINALYSIS, ROUTINE W REFLEX MICROSCOPIC
Bilirubin Urine: NEGATIVE
Glucose, UA: NEGATIVE mg/dL
Ketones, ur: NEGATIVE mg/dL
Nitrite: NEGATIVE
Protein, ur: 30 mg/dL — AB
RBC / HPF: >50 RBC/hpf — ABNORMAL HIGH (ref 0–5)
Specific Gravity, Urine: 1.013 (ref 1.005–1.030)
WBC, UA: >50 WBC/hpf — ABNORMAL HIGH (ref 0–5)
pH: 6 (ref 5.0–8.0)

## 2018-10-02 LAB — LIPASE, BLOOD: Lipase: 32 U/L (ref 11–51)

## 2018-10-02 LAB — WET PREP, GENITAL
Sperm: NONE SEEN
Trich, Wet Prep: NONE SEEN
Yeast Wet Prep HPF POC: NONE SEEN

## 2018-10-02 LAB — PREGNANCY, URINE: Preg Test, Ur: NEGATIVE

## 2018-10-02 MED ORDER — SULFAMETHOXAZOLE-TRIMETHOPRIM 800-160 MG PO TABS: 1.0000 | ORAL_TABLET | Freq: Two times a day (BID) | ORAL | 0 refills | Status: AC

## 2018-10-02 MED ORDER — KETOROLAC TROMETHAMINE 15 MG/ML IJ SOLN
15.0000 mg | Freq: Once | INTRAMUSCULAR | Status: AC
  Administered 2018-10-02: 15 mg via INTRAVENOUS
  Filled 2018-10-02: qty 1

## 2018-10-02 MED ORDER — ONDANSETRON HCL 4 MG/2ML IJ SOLN
4.0000 mg | Freq: Once | INTRAMUSCULAR | Status: AC
  Administered 2018-10-02: 4 mg via INTRAVENOUS
  Filled 2018-10-02: qty 2

## 2018-10-02 NOTE — ED Notes (Signed)
ED Provider at bedside. 

## 2018-10-02 NOTE — ED Provider Notes (Addendum)
[facility] DEPT Provider Note   CSN: [id number] Arrival date & time: 10/02/18  1057    History   Chief Complaint Chief Complaint  Patient presents with   Abdominal Cramping    HPI Melanie Cisneros is a 28 y.o. female sending to emergency department today with chief complaint of abdominal pain.  Patient states she has had abdominal pain x5 months however this morning while at work the pain worsened.  The pain is located in her left lower quadrant and radiates to her left groin.  She describes the pain as sharp and rates it 8 out of 10 in severity.  She did not take any medications for symptoms prior to arrival.  Patient reports associated nausea without emesis.  She denies any fever, chills, urinary symptoms, back pain, flank pain, diarrhea, vaginal discharge.  Patient is currently menstruating.  Chart review shows patient was seen in urgent care 2 weeks ago for similar pain.  She was treated empirically for bacterial vaginosis and recommended gyn follow-up for pelvic ultrasound possible ovarian cyst. She had negative GC test.   Past Medical History:  Diagnosis Date   Bacterial vaginosis    Chlamydia    Gonorrhea    Obesity    UTI (lower urinary tract infection)     Patient Active Problem List   Diagnosis Date Noted   Indication for care in labor or delivery 06/12/2016   BMI 45.0-49.9, adult ([place]) 06/06/2016   Psychosocial stressors 02/25/2016   Ptyalism 02/25/2016   Drug use affecting pregnancy in second trimester 01/27/2016   Rubella non-immune status, antepartum 01/27/2016   Supervision of high risk pregnancy in third trimester 11/20/2015    Past Surgical History:  Procedure Laterality Date   WISDOM TOOTH EXTRACTION       OB History    Gravida  1   Para  1   Term  1   Preterm      AB      Living  1     SAB      TAB      Ectopic      Multiple  0   Live Births  1            Home Medications     Prior to Admission medications   Medication Sig Start Date End Date Taking? Authorizing Provider  Acetaminophen-Caff-Pyrilamine (MIDOL COMPLETE PO) Take 2 tablets by mouth as needed (cramps/headache).   Yes [provider]  Multiple Vitamin (MULTIVITAMIN WITH MINERALS) TABS tablet Take 1 tablet by mouth daily.   Yes [provider]  fluconazole (DIFLUCAN) 150 MG tablet Take 1 tablet (150 mg total) by mouth daily. Take second dose 72 hours later if symptoms still persists. Patient not taking: Reported on 10/02/2018 09/17/18   Ok Edwards, PA-C  metroNIDAZOLE (FLAGYL) 500 MG tablet Take 1 tablet (500 mg total) by mouth 2 (two) times daily. Patient not taking: Reported on 10/02/2018 09/17/18   Ok Edwards, PA-C  sulfamethoxazole-trimethoprim (BACTRIM DS) 800-160 MG tablet Take 1 tablet by mouth 2 (two) times daily for 3 days. 10/02/18 10/05/18  Iyona Pehrson, Harley Hallmark, PA-C    Family History Family History  Problem Relation Age of Onset   Hypertension Mother     Social History Social History   Tobacco Use   Smoking status: Never Smoker   Smokeless tobacco: Never Used  Substance Use Topics   Alcohol use: Yes    Comment: socially   Drug use: No  Allergies   Amoxicillin and Penicillins   Review of Systems Review of Systems  Constitutional: Negative for chills and fever.  HENT: Negative for congestion, ear discharge, ear pain, sinus pressure, sinus pain and sore throat.   Eyes: Negative for pain and redness.  Respiratory: Negative for cough and shortness of breath.   Cardiovascular: Negative for chest pain.  Gastrointestinal: Positive for abdominal pain and nausea. Negative for constipation, diarrhea and vomiting.  Genitourinary: Negative for dysuria, flank pain, frequency, genital sores, hematuria, menstrual problem, vaginal discharge and vaginal pain.  Musculoskeletal: Negative for back pain and neck pain.  Skin: Negative for wound.  Neurological: Negative for  weakness, numbness and headaches.     Physical Exam Updated Vital Signs BP 110/72    Pulse 60    Temp 97.6 F (36.4 C) (Oral)    Resp 18    LMP 09/30/2018    SpO2 99%   Physical Exam Vitals signs and nursing note reviewed.  Constitutional:      Appearance: She is not ill-appearing or toxic-appearing.  HENT:     Head: Normocephalic and atraumatic.     Nose: Nose normal.  Eyes:     General: No scleral icterus.    Conjunctiva/sclera: Conjunctivae normal.  Neck:     Musculoskeletal: Normal range of motion.  Cardiovascular:     Rate and Rhythm: Normal rate and regular rhythm.     Pulses: Normal pulses.          Radial pulses are 2+ on the right side and 2+ on the left side.     Heart sounds: Normal heart sounds.  Pulmonary:     Effort: Pulmonary effort is normal.     Breath sounds: Normal breath sounds.  Abdominal:     General: Bowel sounds are normal. There is no distension.     Palpations: Abdomen is soft.     Tenderness: There is no abdominal tenderness (left lower quadrant). There is no right CVA tenderness, left CVA tenderness, guarding or rebound.  Genitourinary:    Comments: Normal external genitalia. No pain with speculum insertion. Closed cervical os with normal appearance - no rash or lesions. No significant discharge or bleeding noted from cervix. There is bleeding consistent with menses in vaginal vault. On bimanual examination no adnexal tenderness or cervical motion tenderness. Chaperone Kim RN present during exam.  Musculoskeletal: Normal range of motion.  Skin:    General: Skin is warm and dry.  Neurological:     Mental Status: She is alert and oriented to person, place, and time.  Psychiatric:        Behavior: Behavior normal.      ED Treatments / Results  Labs (all labs ordered are listed, but only abnormal results are displayed) Labs Reviewed  WET PREP, GENITAL - Abnormal; Notable for the following components:      Result Value   Clue Cells Wet Prep  HPF POC PRESENT (*)    WBC, Wet Prep HPF POC MANY (*)    All other components within normal limits  CBC WITH DIFFERENTIAL/PLATELET - Abnormal; Notable for the following components:   WBC 14.2 (*)    Neutro Abs 10.7 (*)    Monocytes Absolute 1.1 (*)    All other components within normal limits  URINALYSIS, ROUTINE W REFLEX MICROSCOPIC - Abnormal; Notable for the following components:   APPearance CLOUDY (*)    Hgb urine dipstick LARGE (*)    Protein, ur 30 (*)    Leukocytes,Ua LARGE (*)  RBC / HPF >50 (*)    WBC, UA >50 (*)    Bacteria, UA MANY (*)    All other components within normal limits  URINE CULTURE  COMPREHENSIVE METABOLIC PANEL  PREGNANCY, URINE  LIPASE, BLOOD  GC/CHLAMYDIA PROBE AMP ([place]) NOT AT [facility]    EKG None  Radiology US Transvaginal Non-ob  Result Date: 10/02/2018 CLINICAL DATA:  Initial evaluation for pelvic pain for 5 months. EXAM: TRANSABDOMINAL AND TRANSVAGINAL ULTRASOUND OF PELVIS DOPPLER ULTRASOUND OF OVARIES TECHNIQUE: Both transabdominal and transvaginal ultrasound examinations of the pelvis were performed. Transabdominal technique was performed for global imaging of the pelvis including uterus, ovaries, adnexal regions, and pelvic cul-de-sac. It was necessary to proceed with endovaginal exam following the transabdominal exam to visualize the uterus, endometrium, and ovaries. Color and duplex Doppler ultrasound was utilized to evaluate blood flow to the ovaries. COMPARISON:  None. FINDINGS: Uterus Measurements: 8.7 x 4.8 x 6.6 cm = volume: 146.6 mL. No fibroids or other mass visualized. Endometrium Thickness: 8.9 mm.  No focal abnormality visualized. Right ovary Measurements: 4.0 x 2.1 x 1.8 cm = volume: 8.0 mL. Normal appearance/no adnexal mass. Left ovary Measurements: 3.0 x 2.4 x 2.2 cm = volume: 8.6 mL. Normal appearance/no adnexal mass. Small dominant follicle noted. Pulsed Doppler evaluation of both ovaries demonstrates normal low-resistance  arterial and venous waveforms. Other findings No abnormal free fluid. IMPRESSION: Normal pelvic ultrasound. No evidence for torsion or other acute finding. Electronically Signed   By: Jeannine Boga M.D.   On: 10/02/2018 15:00   US Pelvis Complete  Result Date: 10/02/2018 CLINICAL DATA:  Initial evaluation for pelvic pain for 5 months. EXAM: TRANSABDOMINAL AND TRANSVAGINAL ULTRASOUND OF PELVIS DOPPLER ULTRASOUND OF OVARIES TECHNIQUE: Both transabdominal and transvaginal ultrasound examinations of the pelvis were performed. Transabdominal technique was performed for global imaging of the pelvis including uterus, ovaries, adnexal regions, and pelvic cul-de-sac. It was necessary to proceed with endovaginal exam following the transabdominal exam to visualize the uterus, endometrium, and ovaries. Color and duplex Doppler ultrasound was utilized to evaluate blood flow to the ovaries. COMPARISON:  None. FINDINGS: Uterus Measurements: 8.7 x 4.8 x 6.6 cm = volume: 146.6 mL. No fibroids or other mass visualized. Endometrium Thickness: 8.9 mm.  No focal abnormality visualized. Right ovary Measurements: 4.0 x 2.1 x 1.8 cm = volume: 8.0 mL. Normal appearance/no adnexal mass. Left ovary Measurements: 3.0 x 2.4 x 2.2 cm = volume: 8.6 mL. Normal appearance/no adnexal mass. Small dominant follicle noted. Pulsed Doppler evaluation of both ovaries demonstrates normal low-resistance arterial and venous waveforms. Other findings No abnormal free fluid. IMPRESSION: Normal pelvic ultrasound. No evidence for torsion or other acute finding. Electronically Signed   By: Jeannine Boga M.D.   On: 10/02/2018 15:00   [place] Art/ven Flow Abd Pelv Doppler  Result Date: 10/02/2018 CLINICAL DATA:  Initial evaluation for pelvic pain for 5 months. EXAM: TRANSABDOMINAL AND TRANSVAGINAL ULTRASOUND OF PELVIS DOPPLER ULTRASOUND OF OVARIES TECHNIQUE: Both transabdominal and transvaginal ultrasound examinations of the pelvis were performed.  Transabdominal technique was performed for global imaging of the pelvis including uterus, ovaries, adnexal regions, and pelvic cul-de-sac. It was necessary to proceed with endovaginal exam following the transabdominal exam to visualize the uterus, endometrium, and ovaries. Color and duplex Doppler ultrasound was utilized to evaluate blood flow to the ovaries. COMPARISON:  None. FINDINGS: Uterus Measurements: 8.7 x 4.8 x 6.6 cm = volume: 146.6 mL. No fibroids or other mass visualized. Endometrium Thickness: 8.9 mm.  No focal abnormality  visualized. Right ovary Measurements: 4.0 x 2.1 x 1.8 cm = volume: 8.0 mL. Normal appearance/no adnexal mass. Left ovary Measurements: 3.0 x 2.4 x 2.2 cm = volume: 8.6 mL. Normal appearance/no adnexal mass. Small dominant follicle noted. Pulsed Doppler evaluation of both ovaries demonstrates normal low-resistance arterial and venous waveforms. Other findings No abnormal free fluid. IMPRESSION: Normal pelvic ultrasound. No evidence for torsion or other acute finding. Electronically Signed   By: Jeannine Boga M.D.   On: 10/02/2018 15:00   Ct Renal Stone Study  Result Date: 10/02/2018 CLINICAL DATA:  Left lower abdominal pain EXAM: CT ABDOMEN AND PELVIS WITHOUT CONTRAST TECHNIQUE: Multidetector CT imaging of the abdomen and pelvis was performed following the standard protocol without IV contrast. COMPARISON:  None. FINDINGS: Lower chest: No acute abnormality. Hepatobiliary: No focal liver abnormality is seen. No gallstones, gallbladder wall thickening, or biliary dilatation. Pancreas: Unremarkable. No pancreatic ductal dilatation or surrounding inflammatory changes. Spleen: Normal in size without focal abnormality. Adrenals/Urinary Tract: Adrenal glands are unremarkable. Kidneys are normal, without renal calculi, focal lesion, or hydronephrosis. Bladder is unremarkable. Stomach/Bowel: Stomach is within normal limits. Appendix appears normal. No evidence of bowel wall  thickening, distention, or inflammatory changes. Vascular/Lymphatic: No significant vascular findings are present. No enlarged abdominal or pelvic lymph nodes. Reproductive: No mass or other abnormality. Other: Small fat containing umbilical hernia. No abdominopelvic ascites. Musculoskeletal: No acute or significant osseous findings. IMPRESSION: No non-contrast CT findings of the abdomen or pelvis to explain left lower quadrant abdominal pain. No evidence of urinary tract calculus or hydronephrosis. Electronically Signed   By: Eddie Candle M.D.   On: 10/02/2018 14:23    Procedures Procedures (including critical care time)  Medications Ordered in ED Medications  ketorolac (TORADOL) 15 MG/ML injection 15 mg (15 mg Intravenous Given 10/02/18 1210)  ondansetron (ZOFRAN) injection 4 mg (4 mg Intravenous Given 10/02/18 1210)     Initial Impression / Assessment and Plan / ED Course  I have reviewed the triage vital signs and the nursing notes.  Pertinent labs & imaging results that were available during my care of the patient were reviewed by me and considered in my medical decision making (see chart for details).  28 year old female presents with left lower quadrant abdominal pain.  She is afebrile on arrival, nontoxic-appearing, vitals WNL  On exam she has tenderness to left lower quadrant, no peritoneal signs. Will evaluate with labs and CT renal, [place]. Analgesics, anti-emetics given. DDx includes ovarian cyst, kidney stone, UTI, ectopic pregnancy, less likely STI or PID as pt has recent negative GC tests.  Labs reviewed. Leukocytosis of 14.5, no anemia, no significant electrolyte derangements. LFTs, renal function, and lipase WNL. Urinalysis with signs of infection including large leukocytes, WBC >50, and many bacteria. Urine culture sent. This is possible cause of leukocytosis. CT renal is negative, no stones or hydronephrosis. She also has a normal pelvic ultrasound without evidence for torsion or  other acute Finding. Pelvic exam shows bleeding in vaginal vault, no discharge seen. No adnexal tenderness or cervical motion tenderness. Wet prep with clue cells and WBC. Doubt bacterial vaginosis as pt denies vaginal discharge and none seen on exam, will hold off on treatment at this time.  On repeat abdominal exam abdomen is benign ,remains no peritoneal signs. She is pain and nausea free after IV toradol and zofran.  Patient tolerating PO in the emergency department. Will discharge home with supportive measures. I discussed results, treatment plan, need for GYN/PCP follow-up, and return precautions with  the patient. Provided opportunity for questions, patient confirmed understanding and is in agreement with plan. The patient was discussed with and seen by Dr. Ralene Bathe who agrees with the treatment plan.  This note was prepared with assistance of Systems analyst. Occasional wrong-word or sound-a-like substitutions may have occurred due to the inherent limitations of voice recognition software.   Final Clinical Impressions(s) / ED Diagnoses   Final diagnoses:  Acute cystitis without hematuria    ED Discharge Orders         Ordered    sulfamethoxazole-trimethoprim (BACTRIM DS) 800-160 MG tablet  2 times daily     10/02/18 1523           Czarina Gingras, Harley Hallmark, PA-C 10/02/18 2127    Cherre Robins, PA-C 10/02/18 2133    Quintella Reichert, MD 10/03/18 [phone]

## 2018-10-02 NOTE — ED Triage Notes (Signed)
Pt reports for the past 4-5 months when her cycle comes on she has bad abd pains. Today at work had severe left lower abd pains and had to leave. Pt reports she was seen at [facility] few weeks ago and was told that she will need an ultrasound to see if have cyst. Pt doesn't have a GYN and was told will need referral with having Medicaid.

## 2018-10-02 NOTE — Discharge Instructions (Addendum)

## 2018-10-03 LAB — GC/CHLAMYDIA PROBE AMP (~~LOC~~) NOT AT ARMC
Chlamydia: NEGATIVE
Neisseria Gonorrhea: NEGATIVE

## 2018-10-03 LAB — URINE CULTURE

## 2018-11-08 NOTE — Telephone Encounter (Signed)
Opened in error

## 2018-11-15 ENCOUNTER — Encounter (HOSPITAL_COMMUNITY): Payer: Self-pay

## 2018-11-15 ENCOUNTER — Other Ambulatory Visit: Payer: Self-pay

## 2018-11-15 ENCOUNTER — Ambulatory Visit (HOSPITAL_COMMUNITY)
Admission: EM | Admit: 2018-11-15 | Discharge: 2018-11-15 | Disposition: A | Discharge status: Home / Self Care | Payer: Self-pay | Attending: Family Medicine | Admitting: Family Medicine

## 2018-11-15 DIAGNOSIS — N898 Other specified noninflammatory disorders of vagina: Secondary | ICD-10-CM

## 2018-11-15 LAB — POCT URINALYSIS DIP (DEVICE)
Bilirubin Urine: NEGATIVE
Glucose, UA: NEGATIVE mg/dL
Hgb urine dipstick: NEGATIVE
Ketones, ur: NEGATIVE mg/dL
Leukocytes,Ua: NEGATIVE
Nitrite: NEGATIVE
Protein, ur: NEGATIVE mg/dL
Specific Gravity, Urine: >=1.03 (ref 1.005–1.030)
Urobilinogen, UA: 0.2 mg/dL (ref 0.0–1.0)
pH: 5.5 (ref 5.0–8.0)

## 2018-11-15 LAB — POCT PREGNANCY, URINE: Preg Test, Ur: NEGATIVE

## 2018-11-15 MED ORDER — METRONIDAZOLE 500 MG PO TABS: 500.0000 mg | ORAL_TABLET | Freq: Two times a day (BID) | ORAL | 0 refills | Status: AC

## 2018-11-15 NOTE — Discharge Instructions (Signed)
Urine did not show signs of infection Symptoms most likely BV- begin metronidazole twice daily for 1 week We will call with results of swab  Follow up if symptoms worsening or not improving

## 2018-11-15 NOTE — ED Triage Notes (Signed)
Patient presents to Urgent Care with complaints of lower abdominal pain and odorous vaginal discharge since 3-4 days ago. Patient reports she is not sure if she has a UTI or BV or both.

## 2018-11-16 NOTE — ED Provider Notes (Signed)
[facility]    CSN: [id number] Arrival date & time: 11/15/18  1014      History   Chief Complaint Chief Complaint  Patient presents with  . Abdominal Pain    HPI Melanie Cisneros is a 28 y.o. female no contributing past medical history presenting today for evaluation of vaginal discharge.  Patient says she has had vaginal discharge over the past 3 to 4 days.  She has had a slight associated odor and light increase in urinary frequency.  She is unsure if symptoms are related to UTI or BV.  She has a history of both.  Denies dysuria.  Has had some irritation.  Notes that her last menstrual cycle was 10/31/2018.  Is not on any form of birth control.  She is requesting pregnancy test.  HPI  Past Medical History:  Diagnosis Date  . Bacterial vaginosis   . Chlamydia   . Gonorrhea   . Obesity   . UTI (lower urinary tract infection)     Patient Active Problem List   Diagnosis Date Noted  . Indication for care in labor or delivery 06/12/2016  . BMI 45.0-49.9, adult ([place]) 06/06/2016  . Psychosocial stressors 02/25/2016  . 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 Rubella non-immune status, antepartum 01/27/2016  . Supervision of high risk pregnancy in third trimester 11/20/2015    Past Surgical History:  Procedure Laterality Date  . WISDOM TOOTH EXTRACTION      OB History    Gravida  1   Para  1   Term  1   Preterm      AB      Living  1     SAB      TAB      Ectopic      Multiple  0   Live Births  1            Home Medications    Prior to Admission medications   Medication Sig Start Date End Date Taking? Authorizing Provider  Acetaminophen-Caff-Pyrilamine (MIDOL COMPLETE PO) Take 2 tablets by mouth as needed (cramps/headache).    [provider]  metroNIDAZOLE (FLAGYL) 500 MG tablet Take 1 tablet (500 mg total) by mouth 2 (two) times daily for 7 days. 11/15/18 11/22/18  Saber Dickerman  C, PA-C  Multiple Vitamin (MULTIVITAMIN WITH MINERALS) TABS tablet Take 1 tablet by mouth daily.    [provider]    Family History Family History  Problem Relation Age of Onset  . Hypertension Mother   . Healthy Father     Social History Social History   Tobacco Use  . Smoking status: Never Smoker  . Smokeless tobacco: Never Used  Substance Use Topics  . Alcohol use: Yes    Comment: socially  . Drug use: No     Allergies   Amoxicillin and Penicillins   Review of Systems Review of Systems  Constitutional: Negative for fever.  Respiratory: Negative for shortness of breath.   Cardiovascular: Negative for chest pain.  Gastrointestinal: Negative for abdominal pain, diarrhea, nausea and vomiting.  Genitourinary: Positive for frequency and vaginal discharge. Negative for dysuria, flank pain, genital sores, hematuria, menstrual problem, vaginal bleeding and vaginal pain.  Musculoskeletal: Negative for back pain.  Skin: Negative for rash.  Neurological: Negative for dizziness, light-headedness and headaches.     Physical Exam Triage Vital Signs ED Triage Vitals  Enc Vitals Group     BP 11/15/18  1106 115/78     Pulse Rate 11/15/18 1106 79     Resp 11/15/18 1106 16     Temp 11/15/18 1106 97.9 F (36.6 C)     Temp Source 11/15/18 1106 Oral     SpO2 11/15/18 1106 100 %     Weight --      Height --      Head Circumference --      Peak Flow --      Pain Score 11/15/18 1103 4     Pain Loc --      Pain Edu? --      Excl. in [place]? --    No data found.  Updated Vital Signs BP 115/78 (BP Location: Left Arm)   Pulse 79   Temp 97.9 F (36.6 C) (Oral)   Resp 16   LMP 10/31/2018 (Approximate)   SpO2 100%   Visual Acuity Right Eye Distance:   Left Eye Distance:   Bilateral Distance:    Right Eye Near:   Left Eye Near:    Bilateral Near:     Physical Exam Vitals signs and nursing note reviewed.  Constitutional:      General: She is not in acute  distress.    Appearance: She is well-developed.  HENT:     Head: Normocephalic and atraumatic.  Eyes:     Conjunctiva/sclera: Conjunctivae normal.  Neck:     Musculoskeletal: Neck supple.  Cardiovascular:     Rate and Rhythm: Normal rate and regular rhythm.     Heart sounds: No murmur.  Pulmonary:     Effort: Pulmonary effort is normal. No respiratory distress.     Breath sounds: Normal breath sounds.  Abdominal:     Palpations: Abdomen is soft.     Tenderness: There is no abdominal tenderness.     Comments: Soft, nondistended, nontender light deep palpation throughout abdomen  Genitourinary:    Comments: Deferred Skin:    General: Skin is warm and dry.  Neurological:     Mental Status: She is alert.      UC Treatments / Results  Labs (all labs ordered are listed, but only abnormal results are displayed) Labs Reviewed  POC URINE PREG, ED  POCT URINALYSIS DIP (DEVICE)  POCT PREGNANCY, URINE  CERVICOVAGINAL ANCILLARY ONLY    EKG   Radiology No results found.  Procedures Procedures (including critical care time)  Medications Ordered in UC Medications - No data to display  Initial Impression / Assessment and Plan / UC Course  I have reviewed the triage vital signs and the nursing notes.  Pertinent labs & imaging results that were available during my care of the patient were reviewed by me and considered in my medical decision making (see chart for details).     Pregnancy test negative, UA negative.  Most likely BV as cause of symptoms, will initiate on metronidazole twice daily x1 week.  Vaginal swab obtained to also check for STDs.  Continue to monitor symptoms, follow-up if symptoms not resolving, worsening or changing.Discussed strict return precautions. Patient verbalized understanding and is agreeable with plan.  Final Clinical Impressions(s) / UC Diagnoses   Final diagnoses:  Vaginal discharge     Discharge Instructions     Urine did not show  signs of infection Symptoms most likely BV- begin metronidazole twice daily for 1 week We will call with results of swab  Follow up if symptoms worsening or not improving   ED Prescriptions    Medication Sig  Dispense Auth. Provider   metroNIDAZOLE (FLAGYL) 500 MG tablet Take 1 tablet (500 mg total) by mouth 2 (two) times daily for 7 days. 14 tablet Phelan Goers, [place] C, PA-C     Controlled Substance Prescriptions [place] Controlled Substance Registry consulted? Not Applicable   Janith Lima, [place] 11/16/18 1309

## 2018-11-19 LAB — CERVICOVAGINAL ANCILLARY ONLY
Bacterial vaginitis: POSITIVE — AB
Candida vaginitis: NEGATIVE
Chlamydia: NEGATIVE
Neisseria Gonorrhea: NEGATIVE
Trichomonas: NEGATIVE

## 2018-11-22 ENCOUNTER — Telehealth (HOSPITAL_COMMUNITY): Payer: Self-pay | Admitting: Emergency Medicine

## 2018-11-22 MED ORDER — FLUCONAZOLE 150 MG PO TABS: 150.0000 mg | ORAL_TABLET | Freq: Once | ORAL | 0 refills | Status: AC

## 2018-11-22 NOTE — Telephone Encounter (Signed)
Pt called requersting diflucan stating flagyl gives her a yeast infection. Okay to send per Loura Halt NP

## 2018-12-26 ENCOUNTER — Ambulatory Visit: Payer: Self-pay | Admitting: Podiatry

## 2019-01-12 ENCOUNTER — Encounter (HOSPITAL_COMMUNITY): Payer: Self-pay

## 2019-01-12 ENCOUNTER — Other Ambulatory Visit: Payer: Self-pay

## 2019-01-12 ENCOUNTER — Ambulatory Visit (HOSPITAL_COMMUNITY)
Admission: EM | Admit: 2019-01-12 | Discharge: 2019-01-12 | Disposition: A | Discharge status: Home / Self Care | Payer: Medicaid Other | Attending: Family Medicine | Admitting: Family Medicine

## 2019-01-12 DIAGNOSIS — N912 Amenorrhea, unspecified: Secondary | ICD-10-CM

## 2019-01-12 DIAGNOSIS — Z3202 Encounter for pregnancy test, result negative: Secondary | ICD-10-CM

## 2019-01-12 DIAGNOSIS — R1032 Left lower quadrant pain: Secondary | ICD-10-CM | POA: Insufficient documentation

## 2019-01-12 LAB — CBC WITH DIFFERENTIAL/PLATELET
Abs Immature Granulocytes: 0.03 10*3/uL (ref 0.00–0.07)
Basophils Absolute: 0.1 10*3/uL (ref 0.0–0.1)
Basophils Relative: 1 %
Eosinophils Absolute: 0.2 10*3/uL (ref 0.0–0.5)
Eosinophils Relative: 2 %
HCT: 41.7 % (ref 36.0–46.0)
Hemoglobin: 13.7 g/dL (ref 12.0–15.0)
Immature Granulocytes: 0 %
Lymphocytes Relative: 22 %
Lymphs Abs: 2.1 10*3/uL (ref 0.7–4.0)
MCH: 27.3 pg (ref 26.0–34.0)
MCHC: 32.9 g/dL (ref 30.0–36.0)
MCV: 83.1 fL (ref 80.0–100.0)
Monocytes Absolute: 0.7 10*3/uL (ref 0.1–1.0)
Monocytes Relative: 7 %
Neutro Abs: 6.6 10*3/uL (ref 1.7–7.7)
Neutrophils Relative %: 68 %
Platelets: 323 10*3/uL (ref 150–400)
RBC: 5.02 MIL/uL (ref 3.87–5.11)
RDW: 13.4 % (ref 11.5–15.5)
WBC: 9.7 10*3/uL (ref 4.0–10.5)
nRBC: 0 % (ref 0.0–0.2)

## 2019-01-12 LAB — POCT URINALYSIS DIP (DEVICE)
Bilirubin Urine: NEGATIVE
Glucose, UA: NEGATIVE mg/dL
Hgb urine dipstick: NEGATIVE
Ketones, ur: NEGATIVE mg/dL
Nitrite: NEGATIVE
Protein, ur: NEGATIVE mg/dL
Specific Gravity, Urine: 1.025 (ref 1.005–1.030)
Urobilinogen, UA: 0.2 mg/dL (ref 0.0–1.0)
pH: 6 (ref 5.0–8.0)

## 2019-01-12 LAB — COMPREHENSIVE METABOLIC PANEL
ALT: 18 U/L (ref 0–44)
AST: 17 U/L (ref 15–41)
Albumin: 3.6 g/dL (ref 3.5–5.0)
Alkaline Phosphatase: 87 U/L (ref 38–126)
Anion gap: 8 (ref 5–15)
BUN: 9 mg/dL (ref 6–20)
CO2: 24 mmol/L (ref 22–32)
Calcium: 8.9 mg/dL (ref 8.9–10.3)
Chloride: 107 mmol/L (ref 98–111)
Creatinine, Ser: 0.72 mg/dL (ref 0.44–1.00)
GFR calc Af Amer: >60 mL/min (ref >60)
GFR calc non Af Amer: >60 mL/min (ref >60)
Glucose, Bld: 95 mg/dL (ref 70–99)
Potassium: 3.7 mmol/L (ref 3.5–5.1)
Sodium: 139 mmol/L (ref 135–145)
Total Bilirubin: 0.9 mg/dL (ref 0.3–1.2)
Total Protein: 6.6 g/dL (ref 6.5–8.1)

## 2019-01-12 LAB — POCT PREGNANCY, URINE: Preg Test, Ur: NEGATIVE

## 2019-01-12 LAB — HCG, QUANTITATIVE, PREGNANCY: hCG, Beta Chain, Quant, S: <1 m[IU]/mL (ref <5)

## 2019-01-12 NOTE — ED Provider Notes (Signed)
[place]   PT:[id number] 01/12/19 Arrival Time: 1119  ASSESSMENT & PLAN:  1. Left lower quadrant abdominal pain     UPT negative. Below investigations pending.  Orders Placed This Encounter  Procedures  . Urine culture  . hCG, quantitative, pregnancy  . CBC with Differential  . Comprehensive metabolic panel   Benign abdominal exam. No indications for urgent abdominal/pelvic imaging at this time. Low suspicion of ectopic pregnancy but will await serum hCG. Discussed possibility of ovarian cyst given her current symptoms. May need imaging.  Follow-up Information    Schedule an appointment as soon as possible for a visit  with [facility].   Specialty: Obstetrics and Gynecology Contact information: [address] [place], [place] [id number] [place] [phone]           Discharge Instructions     You have been seen today for abdominal pain. Your evaluation was not suggestive of any emergent condition requiring medical intervention at this time. However, some abdominal problems make take more time to appear. Therefore, it is very important for you to pay attention to any new symptoms or worsening of your current condition.  Please return here or to the Emergency Department immediately should you begin to feel worse in any way or have any of the following symptoms: increasing or different abdominal pain, persistent vomiting, inability to drink fluids, fevers, or shaking chills.       Reviewed expectations re: course of current medical issues. Questions answered. Outlined signs and symptoms indicating need for more acute intervention. Patient verbalized understanding. After Visit Summary given.   SUBJECTIVE: History from: patient. Melanie Cisneros is a 28 y.o. female who presents with complaint of intermittent lower abdominal discomfort; questions L>R. Onset gradual, 3-4 d ago. Also  reports here menstrual period is late by 1-2 weeks. Abdominal discomfort, when present, described as dull; without radiation; does not wake her at night. Symptoms are unchanged since beginning. Aggravating factors: have not been identified. Alleviating factors: have not been identified. Associated symptoms: none reported. She denies arthralgias, belching, chills, constipation, diarrhea, dysuria, fever, headache, hematuria, myalgias, nausea, sweats and vomiting. Appetite: normal. PO intake: normal. Ambulatory without assistance. Bowel movements: have not significantly changed; last bowel movement yesterday without blood. History of similar: no. Reports that she is sexually active with one female partner. No vaginal discharge reported. No pelvic pain. OTC treatment: none.  No LMP recorded. (Menstrual status: Irregular Periods).  Requests UPT.  Past Surgical History:  Procedure Laterality Date  . WISDOM TOOTH EXTRACTION      ROS: As per HPI. All other systems negative.  OBJECTIVE:  Vitals:   01/12/19 1135  BP: 135/72  Pulse: 79  Temp: 98.4 F (36.9 C)  TempSrc: Skin  SpO2: 99%    General appearance: alert, oriented, no acute distress  HEENT: [place]; AT; oropharynx moist Lungs: clear to auscultation bilaterally; unlabored respirations Heart: regular rate and rhythm Abdomen: soft; without distention; reports mild tenderness over bilateral lower abdomen, poorly localized; normal bowel sounds; without masses or organomegaly; without guarding or rebound tenderness Back: without CVA tenderness; FROM at waist Extremities: without LE edema; symmetrical; without gross deformities Skin: warm and dry Neurologic: normal gait Psychological: alert and cooperative; normal mood and affect  Labs: Results for orders placed or performed during the hospital encounter of 01/12/19  Comprehensive metabolic panel  POCT urinalysis dip (device)  Result Value Ref Range   Glucose, UA NEGATIVE NEGATIVE mg/dL  Bilirubin Urine NEGATIVE NEGATIVE   Ketones, ur NEGATIVE NEGATIVE mg/dL   Specific Gravity, Urine 1.025 1.005 - 1.030   Hgb urine dipstick NEGATIVE NEGATIVE   pH 6.0 5.0 - 8.0   Protein, ur NEGATIVE NEGATIVE mg/dL   Urobilinogen, UA 0.2 0.0 - 1.0 mg/dL   Nitrite NEGATIVE NEGATIVE   Leukocytes,Ua TRACE (A) NEGATIVE  Pregnancy, urine POC  Result Value Ref Range   Preg Test, Ur NEGATIVE NEGATIVE   Labs Reviewed - No data to display   Allergies  Allergen Reactions  . Amoxicillin Anaphylaxis, Hives and Other (See Comments)    Has patient had a PCN reaction causing immediate rash, facial/tongue/throat swelling, SOB or lightheadedness with hypotension: Yes Has patient had a PCN reaction causing severe rash involving mucus membranes or skin necrosis: No Has patient had a PCN reaction that required hospitalization No Has patient had a PCN reaction occurring within the last 10 years: No If all of the above answers are "NO", then may proceed with Cephalosporin use.  Marland Kitchen Penicillins Anaphylaxis, Hives and Other (See Comments)    Has patient had a PCN reaction causing immediate rash, facial/tongue/throat swelling, SOB or lightheadedness with hypotension: Yes Has patient had a PCN reaction causing severe rash involving mucus membranes or skin necrosis: No Has patient had a PCN reaction that required hospitalization No Has patient had a PCN reaction occurring within the last 10 years: No If all of the above answers are "NO", then may proceed with Cephalosporin use.                                               Past Medical History:  Diagnosis Date  . Bacterial vaginosis   . Chlamydia   . Gonorrhea   . Obesity   . UTI (lower urinary tract infection)    Social History   Socioeconomic History  . Marital status: Single    Spouse name: Not on file  . Number of children: Not on file  . Years of education: Not on file  . Highest education level: Not on file  Occupational History  . Not on  file  Social Needs  . Financial resource strain: Not on file  . Food insecurity    Worry: Not on file    Inability: Not on file  . Transportation needs    Medical: Not on file    Non-medical: Not on file  Tobacco Use  . Smoking status: Never Smoker  . Smokeless tobacco: Never Used  Substance and Sexual Activity  . Alcohol use: Yes    Comment: socially  . Drug use: No  . Sexual activity: Yes    Birth control/protection: None  Lifestyle  . Physical activity    Days per week: Not on file    Minutes per session: Not on file  . Stress: Not on file  Relationships  . Social Herbalist on phone: Not on file    Gets together: Not on file    Attends religious service: Not on file    Active member of club or organization: Not on file    Attends meetings of clubs or organizations: Not on file    Relationship status: Not on file  . Intimate partner violence    Fear of current or ex partner: Not on file    Emotionally abused: Not on file  Physically abused: Not on file    Forced sexual activity: Not on file  Other Topics Concern  . Not on file  Social History Narrative  . Not on file   Family History  Problem Relation Age of Onset  . Hypertension Mother   . Healthy Father      Vanessa Kick, MD 01/14/19 [phone]

## 2019-01-12 NOTE — Discharge Instructions (Signed)

## 2019-01-12 NOTE — ED Triage Notes (Signed)
Pt present abdominal pain, symptoms started three days ago with sharp pain with a lot of pressure. Pt states her menstruation is two weeks late.

## 2019-01-13 LAB — URINE CULTURE: Culture: NO GROWTH

## 2019-01-24 ENCOUNTER — Encounter (HOSPITAL_COMMUNITY): Payer: Self-pay

## 2019-01-24 ENCOUNTER — Telehealth (HOSPITAL_COMMUNITY): Payer: Self-pay | Admitting: Emergency Medicine

## 2019-01-24 LAB — CERVICOVAGINAL ANCILLARY ONLY
Chlamydia: NEGATIVE
Neisseria Gonorrhea: NEGATIVE
Trichomonas: NEGATIVE

## 2019-01-24 MED ORDER — METRONIDAZOLE 500 MG PO TABS: 500.0000 mg | ORAL_TABLET | Freq: Two times a day (BID) | ORAL | 0 refills | Status: AC

## 2019-01-24 NOTE — Telephone Encounter (Signed)
Bacterial vaginosis is positive. This was not treated at the urgent care visit.  Flagyl 500 mg BID x 7 days #14 no refills sent to patients pharmacy of choice.    Attempted to reach patient. No answer at this time. No voicemail set up.

## 2019-03-11 ENCOUNTER — Other Ambulatory Visit: Payer: Self-pay

## 2019-03-11 ENCOUNTER — Ambulatory Visit (INDEPENDENT_AMBULATORY_CARE_PROVIDER_SITE_OTHER): Payer: Medicaid Other | Admitting: Obstetrics & Gynecology

## 2019-03-11 ENCOUNTER — Other Ambulatory Visit (HOSPITAL_COMMUNITY)
Admission: RE | Admit: 2019-03-11 | Discharge: 2019-03-11 | Disposition: A | Discharge status: Home / Self Care | Payer: Medicaid Other | Source: Ambulatory Visit | Attending: Obstetrics & Gynecology | Admitting: Obstetrics & Gynecology

## 2019-03-11 VITALS — Wt 279.0 lb

## 2019-03-11 DIAGNOSIS — R102 Pelvic and perineal pain: Secondary | ICD-10-CM

## 2019-03-11 DIAGNOSIS — Z0000 Encounter for general adult medical examination without abnormal findings: Secondary | ICD-10-CM | POA: Diagnosis not present

## 2019-03-11 DIAGNOSIS — N898 Other specified noninflammatory disorders of vagina: Secondary | ICD-10-CM | POA: Diagnosis present

## 2019-03-11 DIAGNOSIS — Z01419 Encounter for gynecological examination (general) (routine) without abnormal findings: Secondary | ICD-10-CM | POA: Insufficient documentation

## 2019-03-11 MED ORDER — IBUPROFEN 800 MG PO TABS: 800.0000 mg | ORAL_TABLET | Freq: Three times a day (TID) | ORAL | 1 refills | Status: DC | PRN

## 2019-03-11 NOTE — Progress Notes (Signed)
Subjective:    Melanie Cisneros is a 28 y.o. single P1(3 yo daughter) who presents for an annual exam. She says that she is having pain in her LLQ throughout the month. Sometimes pressure, sometimes very painful.She denies dyspareunia. She has not tried any Tylenol or IBU.  The patient is sexually active but she broke up with him last week.  GYN screening history: last pap: was normal. The patient wears seatbelts: yes. The patient participates in regular exercise: yes. (trail hiking) Has the patient ever been transfused or tattooed?: yes. The patient reports that there is not domestic violence in her life.   Menstrual History: OB History    Gravida  1   Para  1   Term  1   Preterm      AB      Living  1     SAB      TAB      Ectopic      Multiple  0   Live Births  1           Menarche age: 44 No LMP recorded. (Menstrual status: Irregular Periods).    The following portions of the patient's history were reviewed and updated as appropriate: allergies, current medications, past family history, past medical history, past social history, past surgical history and problem list.  Review of Systems Pertinent items are noted in HPI.   FH- no breast/gyn/colon cancer. Her GM had some unknown location of cancer. 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 She declines a flu vaccine.   Objective:    Wt 279 lb (126.6 kg)   BMI 54.49 kg/m   General Appearance:    Alert, cooperative, no distress, appears stated age  Head:    Normocephalic, without obvious abnormality, atraumatic  Eyes:    PERRL, conjunctiva/corneas clear, EOM's intact, fundi    benign, both eyes  Ears:    Normal TM's and external ear canals, both ears  Nose:   Nares normal, septum midline, mucosa normal, no drainage    or sinus tenderness  Throat:   Lips, mucosa, and tongue normal; teeth and gums normal  Neck:   Supple, symmetrical, trachea midline, no adenopathy;    thyroid:  no enlargement/tenderness/nodules;  no carotid   bruit or JVD  Back:     Symmetric, no curvature, ROM normal, no CVA tenderness  Lungs:     Clear to auscultation bilaterally, respirations unlabored  Chest Wall:    No tenderness or deformity   Heart:    Regular rate and rhythm, S1 and S2 normal, no murmur, rub   or gallop  Breast Exam:    No tenderness, masses, or nipple abnormality  Abdomen:     Soft, non-tender, bowel sounds active all four quadrants,    no masses, no organomegaly  Genitalia:    Normal female without lesion, discharge or tenderness, normal size and shape, anteverted, mobile, non-tender, normal adnexal exam      Extremities:   Extremities normal, atraumatic, no cyanosis or edema  Pulses:   2+ and symmetric all extremities  Skin:   Skin color, texture, turgor normal, no rashes or lesions  Lymph nodes:   Cervical, supraclavicular, and axillary nodes normal  Neurologic:   CNII-XII intact, normal strength, sensation and reflexes    throughout  .    Assessment:    Healthy female exam.    Plan:     Thin prep Pap smear.   Gyn u/s Follow up after u/s

## 2019-03-12 LAB — CYTOLOGY - PAP
Chlamydia: NEGATIVE
Comment: NEGATIVE
Comment: NORMAL
Diagnosis: NEGATIVE
Neisseria Gonorrhea: NEGATIVE

## 2019-03-13 LAB — CERVICOVAGINAL ANCILLARY ONLY
Bacterial Vaginitis (gardnerella): NEGATIVE
Candida Glabrata: NEGATIVE
Candida Vaginitis: POSITIVE — AB
Comment: NEGATIVE
Comment: NEGATIVE
Comment: NEGATIVE
Comment: NEGATIVE
Trichomonas: NEGATIVE

## 2019-03-14 ENCOUNTER — Other Ambulatory Visit: Payer: Self-pay | Admitting: Obstetrics & Gynecology

## 2019-03-14 MED ORDER — FLUCONAZOLE 150 MG PO TABS: 150.0000 mg | ORAL_TABLET | Freq: Once | ORAL | 3 refills | Status: AC

## 2019-03-14 NOTE — Progress Notes (Signed)
Diflucan prescribed

## 2019-03-21 ENCOUNTER — Ambulatory Visit (HOSPITAL_COMMUNITY): Payer: Self-pay | Attending: Obstetrics & Gynecology

## 2019-04-02 ENCOUNTER — Other Ambulatory Visit: Payer: Self-pay

## 2019-04-02 ENCOUNTER — Ambulatory Visit (HOSPITAL_COMMUNITY)
Admission: RE | Admit: 2019-04-02 | Discharge: 2019-04-02 | Disposition: A | Discharge status: Home / Self Care | Payer: Self-pay | Source: Ambulatory Visit | Attending: Obstetrics & Gynecology | Admitting: Obstetrics & Gynecology

## 2019-04-02 DIAGNOSIS — R102 Pelvic and perineal pain: Secondary | ICD-10-CM | POA: Insufficient documentation

## 2019-04-03 ENCOUNTER — Other Ambulatory Visit: Payer: Self-pay | Admitting: Obstetrics & Gynecology

## 2019-04-03 DIAGNOSIS — N83201 Unspecified ovarian cyst, right side: Secondary | ICD-10-CM

## 2019-04-24 ENCOUNTER — Other Ambulatory Visit: Payer: Self-pay

## 2019-04-24 ENCOUNTER — Encounter: Payer: Self-pay | Admitting: Obstetrics & Gynecology

## 2019-04-24 ENCOUNTER — Ambulatory Visit (INDEPENDENT_AMBULATORY_CARE_PROVIDER_SITE_OTHER): Payer: Self-pay | Admitting: Obstetrics & Gynecology

## 2019-04-24 VITALS — BP 128/85 | HR 71 | Wt 275.1 lb

## 2019-04-24 DIAGNOSIS — N83201 Unspecified ovarian cyst, right side: Secondary | ICD-10-CM

## 2019-04-24 MED ORDER — IBUPROFEN 800 MG PO TABS: 800.0000 mg | ORAL_TABLET | Freq: Three times a day (TID) | ORAL | 1 refills | Status: DC | PRN

## 2019-04-24 NOTE — Progress Notes (Signed)
   Subjective:    Patient ID: Melanie Cisneros, female    DOB: [birth year], 28 y.o.   MRN: [id number]  HPI 28 yo single P1 here for follow up for LLQ pressure. She had an u/s done 04/02/19 that showed a probable right hemorrhagic cyst. She forgot to pick up the IBU that I prescribed at her last visit. The pressure is essentially unchanged.   Review of Systems Pap normal 11/20    Objective:   Physical Exam  Breathing, conversing, and ambulating normally Well nourished, well hydrated Black female, no apparent distress Abd- benign    Assessment & Plan:  Right ovarian cyst- follow up gyn u/s Virtual vist 2 months

## 2019-04-26 ENCOUNTER — Other Ambulatory Visit: Payer: Self-pay | Admitting: Obstetrics & Gynecology

## 2019-04-26 MED ORDER — METRONIDAZOLE 500 MG PO TABS: 500.0000 mg | ORAL_TABLET | Freq: Two times a day (BID) | ORAL | 0 refills | Status: DC

## 2019-04-26 NOTE — Progress Notes (Signed)
Pt reporting discharge and diflucan did not work.  She has had BV by PCR several times this year.  Will prescribe Flagyl as all offices are closed.  If she is not better she will need to make appt Monday.

## 2019-05-01 ENCOUNTER — Ambulatory Visit (HOSPITAL_COMMUNITY): Payer: Medicaid Other

## 2019-05-02 ENCOUNTER — Encounter: Payer: Self-pay | Admitting: *Deleted

## 2019-05-08 ENCOUNTER — Ambulatory Visit (HOSPITAL_COMMUNITY)
Admission: RE | Admit: 2019-05-08 | Discharge: 2019-05-08 | Disposition: A | Discharge status: Home / Self Care | Payer: Self-pay | Source: Ambulatory Visit | Attending: Obstetrics & Gynecology | Admitting: Obstetrics & Gynecology

## 2019-05-08 ENCOUNTER — Other Ambulatory Visit: Payer: Self-pay

## 2019-05-08 DIAGNOSIS — N83201 Unspecified ovarian cyst, right side: Secondary | ICD-10-CM | POA: Insufficient documentation

## 2019-05-13 ENCOUNTER — Ambulatory Visit: Payer: Medicaid Other

## 2019-06-14 ENCOUNTER — Other Ambulatory Visit: Payer: Self-pay

## 2019-06-14 ENCOUNTER — Telehealth (INDEPENDENT_AMBULATORY_CARE_PROVIDER_SITE_OTHER): Payer: Self-pay | Admitting: Obstetrics & Gynecology

## 2019-06-14 DIAGNOSIS — N83201 Unspecified ovarian cyst, right side: Secondary | ICD-10-CM

## 2019-06-14 NOTE — Progress Notes (Signed)
I connected with  Melanie Cisneros on 06/14/19 at  9:35 AM EST by telephone and verified that I am speaking with the correct person using two identifiers.   I discussed the limitations, risks, security and privacy concerns of performing an evaluation and management service by telephone and the availability of in person appointments. I also discussed with the patient that there may be a patient responsible charge related to this service. The patient expressed understanding and agreed to proceed.  Derinda Late, RN 06/14/2019  9:27 AM

## 2019-06-14 NOTE — Progress Notes (Signed)
    TELEHEALTH GYNECOLOGY VIRTUAL VIDEO VISIT ENCOUNTER NOTE  Provider location: [facility] [place]   I connected with Melanie Cisneros on 06/14/19 at  9:35 AM EST by MyChart Video Encounter at home and verified that I am speaking with the correct person using two identifiers.   I discussed the limitations, risks, security and privacy concerns of performing an evaluation and management service virtually and the availability of in person appointments. I also discussed with the patient that there may be a patient responsible charge related to this service. The patient expressed understanding and agreed to proceed.   History:  Melanie Cisneros is a 29 y.o. G51P1001 female being evaluated today for follow up of a 4 cm right ovarian cyst. She reports that her pain is essentially resolved. She had a follow up u/s 12/20 that showed complete resolution of the cyst.     Past Medical History:  Diagnosis Date  . Bacterial vaginosis   . Chlamydia   . Gonorrhea   . Obesity   . UTI (lower urinary tract infection)    Past Surgical History:  Procedure Laterality Date  . WISDOM TOOTH EXTRACTION     The following portions of the patient's history were reviewed and updated as appropriate: allergies, current medications, past family history, past medical history, past social history, past surgical history and problem list.   Health Maintenance:  Normal pap and negative HRHPV on 11/20  Review of Systems:  Pertinent items noted in HPI and remainder of comprehensive ROS otherwise negative.  Physical Exam:   General:  Alert, oriented and cooperative. Patient appears to be in no acute distress.  Mental Status: Normal mood and affect. Normal behavior. Normal judgment and thought content.   Respiratory: Normal respiratory effort, no problems with respiration noted  Rest of physical exam deferred due to type of encounter  Labs and Imaging No results found for this or any previous  visit (from the past 336 hour(s)). No results found.     Assessment and Plan:  H/o of right ovarian cyst- I offered her OCPs but she prefers not to take any fine. We discussed normal ovarian cyst cycle.      I discussed the assessment and treatment plan with the patient. The patient was provided an opportunity to ask questions and all were answered. The patient agreed with the plan and demonstrated an understanding of the instructions.   The patient was advised to call back or seek an in-person evaluation/go to the ED if the symptoms worsen or if the condition fails to improve as anticipated.  I provided 10 minutes of face-to-face time during this encounter.   Emily Filbert, MD [facility], [place]

## 2019-06-19 ENCOUNTER — Other Ambulatory Visit (HOSPITAL_COMMUNITY)
Admission: RE | Admit: 2019-06-19 | Discharge: 2019-06-19 | Disposition: A | Discharge status: Home / Self Care | Payer: Medicaid Other | Source: Ambulatory Visit | Attending: Family Medicine | Admitting: Family Medicine

## 2019-06-19 ENCOUNTER — Other Ambulatory Visit: Payer: Self-pay

## 2019-06-19 ENCOUNTER — Ambulatory Visit (INDEPENDENT_AMBULATORY_CARE_PROVIDER_SITE_OTHER): Payer: Self-pay

## 2019-06-19 DIAGNOSIS — R300 Dysuria: Secondary | ICD-10-CM

## 2019-06-19 DIAGNOSIS — N898 Other specified noninflammatory disorders of vagina: Secondary | ICD-10-CM

## 2019-06-19 LAB — POCT URINALYSIS DIP (DEVICE)
Bilirubin Urine: NEGATIVE
Glucose, UA: NEGATIVE mg/dL
Hgb urine dipstick: NEGATIVE
Ketones, ur: NEGATIVE mg/dL
Nitrite: NEGATIVE
Protein, ur: NEGATIVE mg/dL
Specific Gravity, Urine: 1.025 (ref 1.005–1.030)
Urobilinogen, UA: 0.2 mg/dL (ref 0.0–1.0)
pH: 7 (ref 5.0–8.0)

## 2019-06-19 NOTE — Progress Notes (Signed)
Agree with A & P. 

## 2019-06-19 NOTE — Progress Notes (Signed)
Pt here today with report that she is having vaginal irritation with no discharge.  Pt also reports that when she urinates it stings.  Pt requested to have a UA and a self swab.  UA resulted small leukocytes.  Pt explained how to obtain self swab and that if anything results abnormal we will call her.  Pt verbalized understanding.  Mel Almond, RN 06/19/19

## 2019-06-20 ENCOUNTER — Telehealth (INDEPENDENT_AMBULATORY_CARE_PROVIDER_SITE_OTHER): Payer: Self-pay | Admitting: General Practice

## 2019-06-20 DIAGNOSIS — N760 Acute vaginitis: Secondary | ICD-10-CM

## 2019-06-20 DIAGNOSIS — B9689 Other specified bacterial agents as the cause of diseases classified elsewhere: Secondary | ICD-10-CM

## 2019-06-20 LAB — CERVICOVAGINAL ANCILLARY ONLY
Bacterial Vaginitis (gardnerella): POSITIVE — AB
Candida Glabrata: NEGATIVE
Candida Vaginitis: NEGATIVE
Chlamydia: NEGATIVE
Comment: NEGATIVE
Comment: NEGATIVE
Comment: NEGATIVE
Comment: NEGATIVE
Comment: NEGATIVE
Comment: NORMAL
Neisseria Gonorrhea: NEGATIVE
Trichomonas: NEGATIVE

## 2019-06-20 MED ORDER — METRONIDAZOLE 500 MG PO TABS: 500.0000 mg | ORAL_TABLET | Freq: Two times a day (BID) | ORAL | 0 refills | Status: DC

## 2019-06-20 NOTE — Telephone Encounter (Signed)
-----   Message from Chancy Milroy, MD sent at 06/20/2019  3:46 PM EST ----- Please send Rx for Flagyl 500 mg po bid x 7 days for BV Pt is aware Thanks Legrand Como

## 2019-06-20 NOTE — Telephone Encounter (Signed)
Called and informed patient of BV and Flagyl sent to pharmacy. Patient verbalized understanding & had no questions.

## 2019-07-03 ENCOUNTER — Telehealth: Payer: Self-pay | Admitting: Lactation Services

## 2019-07-03 ENCOUNTER — Encounter (HOSPITAL_COMMUNITY): Payer: Self-pay

## 2019-07-03 ENCOUNTER — Ambulatory Visit (HOSPITAL_COMMUNITY)
Admission: EM | Admit: 2019-07-03 | Discharge: 2019-07-03 | Disposition: A | Discharge status: Home / Self Care | Payer: Medicaid Other | Attending: Family Medicine | Admitting: Family Medicine

## 2019-07-03 ENCOUNTER — Telehealth (INDEPENDENT_AMBULATORY_CARE_PROVIDER_SITE_OTHER): Payer: Self-pay | Admitting: Lactation Services

## 2019-07-03 ENCOUNTER — Other Ambulatory Visit: Payer: Self-pay

## 2019-07-03 DIAGNOSIS — N898 Other specified noninflammatory disorders of vagina: Secondary | ICD-10-CM

## 2019-07-03 DIAGNOSIS — Z711 Person with feared health complaint in whom no diagnosis is made: Secondary | ICD-10-CM

## 2019-07-03 DIAGNOSIS — Z3202 Encounter for pregnancy test, result negative: Secondary | ICD-10-CM

## 2019-07-03 LAB — POC URINE PREG, ED: Preg Test, Ur: NEGATIVE

## 2019-07-03 LAB — POCT PREGNANCY, URINE: Preg Test, Ur: NEGATIVE

## 2019-07-03 MED ORDER — CEFTRIAXONE SODIUM 500 MG IJ SOLR
INTRAMUSCULAR | Status: AC
  Filled 2019-07-03: qty 500

## 2019-07-03 MED ORDER — FLUCONAZOLE 150 MG PO TABS: 150.0000 mg | ORAL_TABLET | Freq: Every day | ORAL | 0 refills | Status: DC

## 2019-07-03 MED ORDER — CEFTRIAXONE SODIUM 500 MG IJ SOLR
500.0000 mg | Freq: Once | INTRAMUSCULAR | Status: AC
  Administered 2019-07-03: 13:00:00 500 mg via INTRAMUSCULAR

## 2019-07-03 NOTE — ED Provider Notes (Signed)
[place]    CSN: [id number] Arrival date & time: 07/03/19  1129      History   Chief Complaint Chief Complaint  Patient presents with  . Exposure to STD    HPI Melanie Cisneros is a 29 y.o. female.   Patient is a 89-year female past medical history of bacterial vaginosis, chlamydia, gonorrhea, UTI.  She presents today with possible exposure to STDs.  Reporting a sexual partner she had sex with approximate 2 weeks ago reported that he had tested positive for gonorrhea.  She has been having some mild vaginal discharge with odor.  Describes the discharge as brown.  Recently treated for BV approximately 2 weeks ago.  Denies any vaginal itching or irritation.  Denies any dysuria, hematuria or urinary frequency.  Denies any pelvic pain, back pain or fevers.  ROS per HPI      Past Medical History:  Diagnosis Date  . Bacterial vaginosis   . Chlamydia   . Gonorrhea   . Obesity   . UTI (lower urinary tract infection)     Patient Active Problem List   Diagnosis Date Noted  . Indication for care in labor or delivery 06/12/2016  . BMI 45.0-49.9, adult ([place]) 06/06/2016  . Psychosocial stressors 02/25/2016  . 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 Rubella non-immune status, antepartum 01/27/2016  . Supervision of high risk pregnancy in third trimester 11/20/2015    Past Surgical History:  Procedure Laterality Date  . WISDOM TOOTH EXTRACTION      OB History    Gravida  1   Para  1   Term  1   Preterm      AB      Living  1     SAB      TAB      Ectopic      Multiple  0   Live Births  1            Home Medications    Prior to Admission medications   Medication Sig Start Date End Date Taking? Authorizing Provider  fluconazole (DIFLUCAN) 150 MG tablet Take 1 tablet (150 mg total) by mouth daily. 07/03/19   Loura Halt A, NP  ibuprofen (ADVIL) 800 MG tablet Take 1 tablet (800 mg total) by  mouth every 8 (eight) hours as needed. 04/24/19   Emily Filbert, MD  metroNIDAZOLE (FLAGYL) 500 MG tablet Take 1 tablet (500 mg total) by mouth 2 (two) times daily. 06/20/19   Chancy Milroy, MD  Multiple Vitamin (MULTIVITAMIN WITH MINERALS) TABS tablet Take 1 tablet by mouth daily.    [provider]  Multiple Vitamins-Minerals (HAIR/SKIN/NAILS/BIOTIN PO) Take by mouth.    [provider]    Family History Family History  Problem Relation Age of Onset  . Hypertension Mother   . Healthy Father     Social History Social History   Tobacco Use  . Smoking status: Never Smoker  . Smokeless tobacco: Never Used  Substance Use Topics  . Alcohol use: Yes    Comment: socially  . Drug use: No     Allergies   Amoxicillin and Penicillins   Review of Systems Review of Systems   Physical Exam Triage Vital Signs ED Triage Vitals  Enc Vitals Group     BP 07/03/19 1153 134/81     Pulse Rate 07/03/19 1153 76     Resp 07/03/19 1153 18  Temp 07/03/19 1153 98.6 F (37 C)     Temp Source 07/03/19 1153 Oral     SpO2 07/03/19 1153 100 %     Weight --      Height --      Head Circumference --      Peak Flow --      Pain Score 07/03/19 1156 0     Pain Loc --      Pain Edu? --      Excl. in [place]? --    No data found.  Updated Vital Signs BP 134/81 (BP Location: Left Arm)   Pulse 76   Temp 98.6 F (37 C) (Oral)   Resp 18   LMP 06/13/2019 (Exact Date)   SpO2 100%   Visual Acuity Right Eye Distance:   Left Eye Distance:   Bilateral Distance:    Right Eye Near:   Left Eye Near:    Bilateral Near:     Physical Exam Vitals and nursing note reviewed.  Constitutional:      General: She is not in acute distress.    Appearance: Normal appearance. She is not ill-appearing, toxic-appearing or diaphoretic.  HENT:     Head: Normocephalic.     Nose: Nose normal.  Eyes:     Conjunctiva/sclera: Conjunctivae normal.  Pulmonary:     Effort: Pulmonary effort is  normal.  Musculoskeletal:        General: Normal range of motion.     Cervical back: Normal range of motion.  Skin:    General: Skin is warm and dry.     Findings: No rash.  Neurological:     Mental Status: She is alert.  Psychiatric:        Mood and Affect: Mood normal.      UC Treatments / Results  Labs (all labs ordered are listed, but only abnormal results are displayed) Labs Reviewed  POC URINE PREG, ED  POCT PREGNANCY, URINE  CERVICOVAGINAL ANCILLARY ONLY    EKG   Radiology No results found.  Procedures Procedures (including critical care time)  Medications Ordered in UC Medications  cefTRIAXone (ROCEPHIN) injection 500 mg (500 mg Intramuscular Given 07/03/19 1232)    Initial Impression / Assessment and Plan / UC Course  I have reviewed the triage vital signs and the nursing notes.  Pertinent labs & imaging results that were available during my care of the patient were reviewed by me and considered in my medical decision making (see chart for details).     Vaginal discharge and concern for STD.  Cervical swabs for testing with labs pending.  Will treat prophylactically for gonorrhea based on exposure Use medication sent to the pharmacy Recommended safe sex practices. Follow up as needed for continued or worsening symptoms  Final Clinical Impressions(s) / UC Diagnoses   Final diagnoses:  Vaginal discharge  Concern about STD in female without diagnosis     Discharge Instructions     Sending medication to the pharmacy to treat yeast infection. Also treating here for possibility of gonorrhea. We will call you with any positive results of your swab.    ED Prescriptions    Medication Sig Dispense Auth. Provider   fluconazole (DIFLUCAN) 150 MG tablet Take 1 tablet (150 mg total) by mouth daily. 2 tablet Loura Halt A, NP     PDMP not reviewed this encounter.   Loura Halt A, NP 07/03/19 1310

## 2019-07-03 NOTE — ED Triage Notes (Signed)
Pt presents for STD testing after partner told her he tested positive for an STD.

## 2019-07-03 NOTE — Telephone Encounter (Signed)
Patient was called and was informed that a sexual partner tested positive for NGU ? Gonorrhea. She reports she had sexual intercourse about 2 weeks ago with this partner.   Pt reports her testing came back + for BV and took her pills for treatment.   Pt reports she is having some light brown discharge in her underwear. She reports a slight odor to her discharge. Patient reports she is not sure her discharge cleared up after the treatment for BV.   Patient reports [facility] and they do not have an appointment until Monday. She has an appointment in the office here on Monday also. Patient reports she is worried, advised her that she can go to an Urgent Care for diagnosis and treatment if needed also. Pt voiced understanding.

## 2019-07-03 NOTE — Telephone Encounter (Signed)
Patient left message on nurse voicemail that she has concerns about her swab on 2/3. Left message for patient to call the office back at her earliest convenience.

## 2019-07-03 NOTE — Discharge Instructions (Addendum)
Sending medication to the pharmacy to treat yeast infection. Also treating here for possibility of gonorrhea. We will call you with any positive results of your swab.

## 2019-07-05 LAB — CERVICOVAGINAL ANCILLARY ONLY
Bacterial vaginitis: NEGATIVE
Candida vaginitis: NEGATIVE
Chlamydia: NEGATIVE
Neisseria Gonorrhea: NEGATIVE
Trichomonas: NEGATIVE

## 2019-07-08 ENCOUNTER — Ambulatory Visit: Payer: Medicaid Other

## 2019-08-09 ENCOUNTER — Other Ambulatory Visit: Payer: Self-pay

## 2019-08-09 ENCOUNTER — Ambulatory Visit (INDEPENDENT_AMBULATORY_CARE_PROVIDER_SITE_OTHER): Payer: Self-pay | Admitting: *Deleted

## 2019-08-09 ENCOUNTER — Encounter: Payer: Self-pay | Admitting: Obstetrics & Gynecology

## 2019-08-09 ENCOUNTER — Other Ambulatory Visit (HOSPITAL_COMMUNITY)
Admission: RE | Admit: 2019-08-09 | Discharge: 2019-08-09 | Disposition: A | Discharge status: Home / Self Care | Payer: Medicaid Other | Source: Ambulatory Visit | Attending: Family Medicine | Admitting: Family Medicine

## 2019-08-09 DIAGNOSIS — N926 Irregular menstruation, unspecified: Secondary | ICD-10-CM

## 2019-08-09 DIAGNOSIS — N898 Other specified noninflammatory disorders of vagina: Secondary | ICD-10-CM | POA: Insufficient documentation

## 2019-08-09 DIAGNOSIS — Z3202 Encounter for pregnancy test, result negative: Secondary | ICD-10-CM

## 2019-08-09 DIAGNOSIS — R350 Frequency of micturition: Secondary | ICD-10-CM

## 2019-08-09 DIAGNOSIS — Z3200 Encounter for pregnancy test, result unknown: Secondary | ICD-10-CM

## 2019-08-09 DIAGNOSIS — M62838 Other muscle spasm: Secondary | ICD-10-CM

## 2019-08-09 DIAGNOSIS — R82998 Other abnormal findings in urine: Secondary | ICD-10-CM

## 2019-08-09 LAB — POCT URINALYSIS DIP (DEVICE)
Bilirubin Urine: NEGATIVE
Glucose, UA: NEGATIVE mg/dL
Hgb urine dipstick: NEGATIVE
Ketones, ur: NEGATIVE mg/dL
Nitrite: NEGATIVE
Protein, ur: NEGATIVE mg/dL
Specific Gravity, Urine: 1.02 (ref 1.005–1.030)
Urobilinogen, UA: 0.2 mg/dL (ref 0.0–1.0)
pH: 6.5 (ref 5.0–8.0)

## 2019-08-09 LAB — POCT PREGNANCY, URINE: Preg Test, Ur: NEGATIVE

## 2019-08-09 NOTE — Patient Instructions (Addendum)
You have been referred to [place]

## 2019-08-09 NOTE — Progress Notes (Signed)
Here for self swab today. Thinks she has BV again - states having bad odor,  dryness,  And whitish vaginal discharge- states more than usual.  Wants to do self swab to " check for everything"  Also wants to do pregnancy test beause she missed her period. Also wants to check UA to make sure no infection - because urinates a lot. I explained I can order these tests but she may be billed for some of these as they may not be covered by medicaid family planning. She voices understanding. Jillana Selph,RN

## 2019-08-09 NOTE — Progress Notes (Signed)
Patient seen and assessed by nursing staff during this encounter. I have reviewed the chart and agree with the documentation and plan.  Verita Schneiders, MD 08/09/2019 11:37 AM

## 2019-08-10 LAB — URINE CULTURE

## 2019-08-12 ENCOUNTER — Telehealth (INDEPENDENT_AMBULATORY_CARE_PROVIDER_SITE_OTHER): Payer: Self-pay | Admitting: *Deleted

## 2019-08-12 DIAGNOSIS — Z712 Person consulting for explanation of examination or test findings: Secondary | ICD-10-CM

## 2019-08-12 LAB — CERVICOVAGINAL ANCILLARY ONLY
Bacterial Vaginitis (gardnerella): NEGATIVE
Candida Glabrata: NEGATIVE
Candida Vaginitis: POSITIVE — AB
Chlamydia: NEGATIVE
Comment: NEGATIVE
Comment: NEGATIVE
Comment: NEGATIVE
Comment: NEGATIVE
Comment: NEGATIVE
Comment: NORMAL
Neisseria Gonorrhea: NEGATIVE
Trichomonas: NEGATIVE

## 2019-08-12 NOTE — Telephone Encounter (Signed)
Called patient and she states she doesn't understand her swab results- wants to know if she has BV. Told patient the test for BV was negative but she does have a yeast infection. Patient states she still has a pill left from last time. She asked if she needed two pills. Told patient to go ahead and take the one pill and if she wasn't better in 3 days to call back. Patient verbalized understanding and asked about referral status. Provided phone number for primary care at [facility] and advised patient to call them for an appointment. Patient verbalized understanding.

## 2019-08-12 NOTE — Telephone Encounter (Signed)
Pt left VM message stating that she would like someone to call her regarding her test results. She does not understand them.

## 2019-08-15 ENCOUNTER — Telehealth: Payer: Self-pay | Admitting: Family Medicine

## 2019-08-15 ENCOUNTER — Other Ambulatory Visit: Payer: Self-pay | Admitting: Obstetrics & Gynecology

## 2019-08-15 DIAGNOSIS — B373 Candidiasis of vulva and vagina: Secondary | ICD-10-CM

## 2019-08-15 DIAGNOSIS — B3731 Acute candidiasis of vulva and vagina: Secondary | ICD-10-CM

## 2019-08-15 MED ORDER — FLUCONAZOLE 150 MG PO TABS: 150.0000 mg | ORAL_TABLET | ORAL | 3 refills | Status: DC

## 2019-08-15 NOTE — Telephone Encounter (Signed)
Call returned to pt and she stated that after speaking to [place] on 3/29, she took the pill to treat yeast infection which she had from a previous prescription. She is still having some irritation. Per chart review, Rx has already been sent to pt's pharmacy by Dr. Harolyn Rutherford. Pt was advised that she may not require all of the tablets which were prescribed.

## 2019-08-15 NOTE — Telephone Encounter (Signed)
Patient is requesting a call back.

## 2019-09-01 ENCOUNTER — Encounter (HOSPITAL_COMMUNITY): Payer: Self-pay

## 2019-09-01 ENCOUNTER — Other Ambulatory Visit: Payer: Self-pay

## 2019-09-01 ENCOUNTER — Emergency Department (HOSPITAL_COMMUNITY)
Admission: EM | Admit: 2019-09-01 | Discharge: 2019-09-01 | Disposition: A | Discharge status: Home / Self Care | Payer: Medicaid Other | Attending: Emergency Medicine | Admitting: Emergency Medicine

## 2019-09-01 DIAGNOSIS — N939 Abnormal uterine and vaginal bleeding, unspecified: Secondary | ICD-10-CM | POA: Insufficient documentation

## 2019-09-01 DIAGNOSIS — R102 Pelvic and perineal pain: Secondary | ICD-10-CM | POA: Insufficient documentation

## 2019-09-01 DIAGNOSIS — Z87440 Personal history of urinary (tract) infections: Secondary | ICD-10-CM | POA: Insufficient documentation

## 2019-09-01 LAB — HCG, QUANTITATIVE, PREGNANCY: hCG, Beta Chain, Quant, S: <1 m[IU]/mL (ref <5)

## 2019-09-01 LAB — I-STAT BETA HCG BLOOD, ED (MC, WL, AP ONLY): I-stat hCG, quantitative: <5 m[IU]/mL (ref <5)

## 2019-09-01 NOTE — ED Triage Notes (Signed)
Pt reports pelvic pain and vaginal bleeding x3 days. Pt reports having a normal period that ended on 08/20/19. Pt states she called the nurse hotline and they told her to come here to rule out ectopic pregnancy.

## 2019-09-01 NOTE — ED Provider Notes (Signed)
[facility] Emergency Department Provider Note MRN:  [id number]  Arrival date & time: 09/01/19     Chief Complaint   Pelvic Pain and Vaginal Bleeding   History of Present Illness   Melanie Cisneros is a 29 y.o. year-old female with a history of chlamydia presenting to the ED with chief complaint of vaginal bleeding.  Patient explains that she finished her normal period on April 6.  She began having bleeding 2 or 3 days ago, which is atypical for her.  She endorses some pelvic cramping type pain earlier today but it has resolved.  Currently pain-free.  She describes the bleeding as typical of a normal period.  She denies fever, no chest pain or shortness of breath, no abdominal pain.  No vaginal discharge.  She called her primary care doctor and a nurse told her to go to the emergency department for evaluation.  Review of Systems  A complete 10 system review of systems was obtained and all systems are negative except as noted in the HPI and PMH.   Patient's Health History    Past Medical History:  Diagnosis Date  . Bacterial vaginosis   . Chlamydia   . Gonorrhea   . Obesity   . UTI (lower urinary tract infection)     Past Surgical History:  Procedure Laterality Date  . WISDOM TOOTH EXTRACTION      Family History  Problem Relation Age of Onset  . Hypertension Mother   . Healthy Father     Social History   Socioeconomic History  . Marital status: Single    Spouse name: Not on file  . Number of children: Not on file  . Years of education: Not on file  . Highest education level: Not on file  Occupational History  . Not on file  Tobacco Use  . Smoking status: Never Smoker  . Smokeless tobacco: Never Used  Substance and Sexual Activity  . Alcohol use: Yes    Comment: socially  . Drug use: No  . Sexual activity: Yes    Birth control/protection: None  Other Topics Concern  . Not on file  Social History Narrative  . Not on file   Social  Determinants of Health   Financial Resource Strain:   . Difficulty of Paying Living Expenses:   Food Insecurity:   . Worried About Charity fundraiser in the Last Year:   . Arboriculturist in the Last Year:   Transportation Needs:   . Film/video editor (Medical):   Marland Kitchen Lack of Transportation (Non-Medical):   Physical Activity:   . Days of Exercise per Week:   . Minutes of Exercise per Session:   Stress:   . Feeling of Stress :   Social Connections:   . Frequency of Communication with Friends and Family:   . Frequency of Social Gatherings with Friends and Family:   . Attends Religious Services:   . Active Member of Clubs or Organizations:   . Attends Archivist Meetings:   Marland Kitchen Marital Status:   Intimate Partner Violence:   . Fear of Current or Ex-Partner:   . Emotionally Abused:   Marland Kitchen Physically Abused:   . Sexually Abused:      Physical Exam   Vitals:   09/01/19 1930 09/01/19 1958  BP: 135/74 136/85  Pulse: 66 69  Resp: 16 16  Temp: 98.2 F (36.8 C) 97.8 F (36.6 C)  SpO2: 99% 98%    CONSTITUTIONAL:  Well-appearing, NAD NEURO:  Alert and oriented x 3, no focal deficits EYES:  eyes equal and reactive ENT/NECK:  no LAD, no JVD CARDIO: Regular rate, well-perfused, normal S1 and S2 PULM:  CTAB no wheezing or rhonchi GI/GU:  normal bowel sounds, non-distended, non-tender MSK/SPINE:  No gross deformities, no edema SKIN:  no rash, atraumatic PSYCH:  Appropriate speech and behavior  *Additional and/or pertinent findings included in MDM below  Diagnostic and Interventional Summary    EKG Interpretation  Date/Time:    Ventricular Rate:    PR Interval:    QRS Duration:   QT Interval:    QTC Calculation:   R Axis:     Text Interpretation:        Labs Reviewed  HCG, QUANTITATIVE, PREGNANCY  I-STAT BETA HCG BLOOD, ED ([facility], [facility], [facility] ONLY)    No orders to display    Medications - No data to display   Procedures  /  Critical Care Procedures  ED  Course and Medical Decision Making  I have reviewed the triage vital signs, the nursing notes, and pertinent available records from the EMR.  Listed above are laboratory and imaging tests that I personally ordered, reviewed, and interpreted and then considered in my medical decision making (see below for details).      Normal vital signs, completely benign abdomen, overall very reassuring exam.  Other than light vaginal bleeding patient has no symptoms.  hCG is negative, ectopic pregnancy excluded.  Suspect abnormal uterine bleeding, but no emergent condition, appropriate for discharge.    Barth Kirks. [place]onia Small, [place] [email or web address]  Final Clinical Impressions(s) / ED Diagnoses     ICD-10-CM   1. Abnormal uterine bleeding  N93.9     ED Discharge Orders    None       Discharge Instructions Discussed with and Provided to Patient:     Discharge Instructions     You were evaluated in the Emergency Department and after careful evaluation, we did not find any emergent condition requiring admission or further testing in the hospital.  Your exam/testing today was overall reassuring.  Your pregnancy test was negative.  You do not have an ectopic pregnancy.  If your abnormal bleeding continues, we recommend follow-up with a GYN specialist.  Please return to the Emergency Department if you experience any worsening of your condition.  We encourage you to follow up with a primary care provider.  Thank you for allowing [place] to be a part of your care.       Maudie Flakes, MD 09/01/19 2052

## 2019-09-01 NOTE — Discharge Instructions (Addendum)
You were evaluated in the Emergency Department and after careful evaluation, we did not find any emergent condition requiring admission or further testing in the hospital.  Your exam/testing today was overall reassuring.  Your pregnancy test was negative.  You do not have an ectopic pregnancy.  If your abnormal bleeding continues, we recommend follow-up with a GYN specialist.  Please return to the Emergency Department if you experience any worsening of your condition.  We encourage you to follow up with a primary care provider.  Thank you for allowing [place] to be a part of your care.

## 2019-10-02 ENCOUNTER — Telehealth: Payer: Medicaid Other | Admitting: Family

## 2019-10-02 DIAGNOSIS — B3731 Acute candidiasis of vulva and vagina: Secondary | ICD-10-CM

## 2019-10-02 DIAGNOSIS — B373 Candidiasis of vulva and vagina: Secondary | ICD-10-CM

## 2019-10-02 MED ORDER — FLUCONAZOLE 150 MG PO TABS: 150.0000 mg | ORAL_TABLET | ORAL | 0 refills | Status: DC | PRN

## 2019-10-02 NOTE — Progress Notes (Signed)

## 2019-10-04 ENCOUNTER — Ambulatory Visit (HOSPITAL_COMMUNITY)
Admission: EM | Admit: 2019-10-04 | Discharge: 2019-10-04 | Disposition: A | Discharge status: Home / Self Care | Payer: Self-pay | Attending: Emergency Medicine | Admitting: Emergency Medicine

## 2019-10-04 ENCOUNTER — Ambulatory Visit (INDEPENDENT_AMBULATORY_CARE_PROVIDER_SITE_OTHER): Payer: Self-pay

## 2019-10-04 ENCOUNTER — Encounter (HOSPITAL_COMMUNITY): Payer: Self-pay

## 2019-10-04 ENCOUNTER — Other Ambulatory Visit: Payer: Self-pay

## 2019-10-04 DIAGNOSIS — R0602 Shortness of breath: Secondary | ICD-10-CM

## 2019-10-04 DIAGNOSIS — Z3202 Encounter for pregnancy test, result negative: Secondary | ICD-10-CM

## 2019-10-04 DIAGNOSIS — Z8616 Personal history of COVID-19: Secondary | ICD-10-CM

## 2019-10-04 LAB — POC URINE PREG, ED: Preg Test, Ur: NEGATIVE

## 2019-10-04 MED ORDER — ALBUTEROL SULFATE HFA 108 (90 BASE) MCG/ACT IN AERS: 1.0000 | INHALATION_SPRAY | Freq: Four times a day (QID) | RESPIRATORY_TRACT | 0 refills | Status: DC | PRN

## 2019-10-04 NOTE — ED Triage Notes (Signed)
Pt presents with shortness of breath and feeling of chest congestion; Pt tested positive for covid 09/16/2019  Pt has Hx of pneumonia.

## 2019-10-04 NOTE — ED Provider Notes (Signed)
[facility]    CSN: [id number] Arrival date & time: 10/04/19  1612      History   Chief Complaint Chief Complaint  Patient presents with  . Shortness of Breath    HPI Marshae D Maynor is a 29 y.o. female.   Who presented to the urgent care with a complaint of worsening shortness of breath and chest congestion for the past few days.  Patient reported he tested positive for COVID-19 on 09/16/2019.  Denies sick exposure flu or strep.  Denies recent travel.  Denies aggravating or alleviating symptoms.  Denies fever, chills, fatigue, nasal congestion, rhinorrhea, sore throat, cough,  wheezing, chest pain, nausea, vomiting, changes in bowel or bladder habits.    The history is provided by the patient. No language interpreter was used.  Shortness of Breath   Past Medical History:  Diagnosis Date  . Bacterial vaginosis   . Chlamydia   . Gonorrhea   . Obesity   . UTI (lower urinary tract infection)     Patient Active Problem List   Diagnosis Date Noted  . Indication for care in labor or delivery 06/12/2016  . BMI 45.0-49.9, adult ([place]) 06/06/2016  . Psychosocial stressors 02/25/2016  . 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 Rubella non-immune status, antepartum 01/27/2016  . Supervision of high risk pregnancy in third trimester 11/20/2015    Past Surgical History:  Procedure Laterality Date  . WISDOM TOOTH EXTRACTION      OB History    Gravida  1   Para  1   Term  1   Preterm      AB      Living  1     SAB      TAB      Ectopic      Multiple  0   Live Births  1            Home Medications    Prior to Admission medications   Medication Sig Start Date End Date Taking? Authorizing Provider  albuterol (VENTOLIN HFA) 108 (90 Base) MCG/ACT inhaler Inhale 1-2 puffs into the lungs every 6 (six) hours as needed for wheezing or shortness of breath. 10/04/19   Rosaelena Kemnitz, Darrelyn Hillock, FNP  fluconazole  (DIFLUCAN) 150 MG tablet Take 1 tablet (150 mg total) by mouth every three (3) days as needed. 10/02/19   Evelina Dun A, FNP  ibuprofen (ADVIL) 800 MG tablet Take 1 tablet (800 mg total) by mouth every 8 (eight) hours as needed. 04/24/19   Emily Filbert, MD  metroNIDAZOLE (FLAGYL) 500 MG tablet Take 1 tablet (500 mg total) by mouth 2 (two) times daily. 06/20/19   Chancy Milroy, MD  Multiple Vitamin (MULTIVITAMIN WITH MINERALS) TABS tablet Take 1 tablet by mouth daily.    [provider]  Multiple Vitamins-Minerals (HAIR/SKIN/NAILS/BIOTIN PO) Take by mouth.    [provider]    Family History Family History  Problem Relation Age of Onset  . Hypertension Mother   . Healthy Father     Social History Social History   Tobacco Use  . Smoking status: Never Smoker  . Smokeless tobacco: Never Used  Substance Use Topics  . Alcohol use: Yes    Comment: socially  . Drug use: No     Allergies   Amoxicillin and Penicillins   Review of Systems Review of Systems  Constitutional: Negative.   HENT: Negative.   Respiratory: Positive for  shortness of breath.   Cardiovascular: Negative.   Gastrointestinal: Negative.   Neurological: Negative.   All other systems reviewed and are negative.    Physical Exam Triage Vital Signs ED Triage Vitals  Enc Vitals Group     BP 10/04/19 1701 122/74     Pulse Rate 10/04/19 1701 (!) 105     Resp 10/04/19 1701 16     Temp 10/04/19 1701 99 F (37.2 C)     Temp Source 10/04/19 1701 Oral     SpO2 10/04/19 1701 95 %     Weight --      Height --      Head Circumference --      Peak Flow --      Pain Score 10/04/19 1700 2     Pain Loc --      Pain Edu? --      Excl. in [place]? --    No data found.  Updated Vital Signs BP 122/74 (BP Location: Left Arm)   Pulse (!) 105   Temp 99 F (37.2 C) (Oral)   Resp 16   SpO2 95%   Visual Acuity Right Eye Distance:   Left Eye Distance:   Bilateral Distance:    Right Eye Near:     Left Eye Near:    Bilateral Near:     Physical Exam Vitals and nursing note reviewed.  Constitutional:      General: She is not in acute distress.    Appearance: Normal appearance. She is normal weight. She is not ill-appearing or toxic-appearing.  HENT:     Head: Normocephalic.     Right Ear: Tympanic membrane, ear canal and external ear normal. There is no impacted cerumen.     Left Ear: Tympanic membrane, ear canal and external ear normal. There is no impacted cerumen.     Nose: Nose normal. No congestion.     Mouth/Throat:     Mouth: Mucous membranes are moist.     Pharynx: Oropharynx is clear. No oropharyngeal exudate or posterior oropharyngeal erythema.  Cardiovascular:     Rate and Rhythm: Normal rate and regular rhythm.     Pulses: Normal pulses.     Heart sounds: Normal heart sounds. No murmur.  Pulmonary:     Effort: Pulmonary effort is normal. No respiratory distress.     Breath sounds: Normal breath sounds. No wheezing or rhonchi.  Chest:     Chest wall: No tenderness.  Abdominal:     General: Abdomen is flat. Bowel sounds are normal. There is no distension.     Palpations: There is no mass.     Tenderness: There is no abdominal tenderness.     Hernia: No hernia is present.  Neurological:     Mental Status: She is alert and oriented to person, place, and time.      UC Treatments / Results  Labs (all labs ordered are listed, but only abnormal results are displayed) Labs Reviewed  POC URINE PREG, ED    EKG   Radiology DG Chest 2 View  Result Date: 10/04/2019 CLINICAL DATA:  Shortness of breath. Additional history provided: Patient reports shortness of breath for 4 days, tested positive for COVID 09/16/2019. EXAM: CHEST - 2 VIEW COMPARISON:  Prior chest radiographs 04/19/2013 and earlier FINDINGS: Heart size within normal limits. There is no appreciable airspace consolidation. No evidence of pleural effusion or pneumothorax. No acute bony abnormality  identified. IMPRESSION: No evidence of acute cardiopulmonary abnormality. Electronically Signed  By: Kellie Simmering DO   On: 10/04/2019 18:08    Procedures Procedures (including critical care time)  Medications Ordered in UC Medications - No data to display  Initial Impression / Assessment and Plan / UC Course  I have reviewed the triage vital signs and the nursing notes.  Pertinent labs & imaging results that were available during my care of the patient were reviewed by me and considered in my medical decision making (see chart for details).    Patient is stable for discharge.  Check x-ray is negative for bony acute cardiopulmonary disease.  I have reviewed the x-ray myself and the radiologist interpretation.  I am in agreement with the radiologist interpretation.  Patient is stable for discharge.  Symptom is likely from Covid infection.  Chest x-ray was negative.  Proair will be prescribed for symptomatic relief.  Was advised to follow PCP.  Final Clinical Impressions(s) / UC Diagnoses   Final diagnoses:  Shortness of breath  History of COVID-19     Discharge Instructions     Chest x-ray was negative for acute cardiopulmonary disease.  This suggested her symptoms are more likely from sequale of Covid infection ProAir was prescribed for shortness of breath Follow-up with PCP Return or go to ED for worsening of symptoms    ED Prescriptions    Medication Sig Dispense Auth. Provider   albuterol (VENTOLIN HFA) 108 (90 Base) MCG/ACT inhaler Inhale 1-2 puffs into the lungs every 6 (six) hours as needed for wheezing or shortness of breath. 18 g [name], FNP     PDMP not reviewed this encounter.   [name], FNP 10/04/19 1819

## 2019-10-04 NOTE — Discharge Instructions (Addendum)
Chest x-ray was negative for acute cardiopulmonary disease.  This suggested her symptoms are more likely from sequale of Covid infection ProAir was prescribed for shortness of breath Follow-up with PCP Return or go to ED for worsening of symptoms

## 2019-10-07 ENCOUNTER — Encounter: Payer: Self-pay | Admitting: *Deleted

## 2019-10-29 ENCOUNTER — Other Ambulatory Visit (HOSPITAL_COMMUNITY)
Admission: RE | Admit: 2019-10-29 | Discharge: 2019-10-29 | Disposition: A | Discharge status: Home / Self Care | Payer: Medicaid Other | Source: Ambulatory Visit | Attending: Nurse Practitioner | Admitting: Nurse Practitioner

## 2019-10-29 ENCOUNTER — Other Ambulatory Visit: Payer: Self-pay

## 2019-10-29 ENCOUNTER — Ambulatory Visit (INDEPENDENT_AMBULATORY_CARE_PROVIDER_SITE_OTHER): Payer: Medicaid Other

## 2019-10-29 DIAGNOSIS — R8290 Unspecified abnormal findings in urine: Secondary | ICD-10-CM | POA: Diagnosis not present

## 2019-10-29 DIAGNOSIS — N898 Other specified noninflammatory disorders of vagina: Secondary | ICD-10-CM | POA: Diagnosis not present

## 2019-10-29 LAB — POCT URINALYSIS DIP (DEVICE)
Bilirubin Urine: NEGATIVE
Glucose, UA: NEGATIVE mg/dL
Hgb urine dipstick: NEGATIVE
Ketones, ur: NEGATIVE mg/dL
Nitrite: NEGATIVE
Protein, ur: NEGATIVE mg/dL
Specific Gravity, Urine: 1.025 (ref 1.005–1.030)
Urobilinogen, UA: 0.2 mg/dL (ref 0.0–1.0)
pH: 5.5 (ref 5.0–8.0)

## 2019-10-29 NOTE — Progress Notes (Addendum)
Pt reports that she is here today because she is gets BV all the time and thinks that she has it now.  Pt reports that she is having vaginal discharge with odor.  She hold her urine so she may have a UTI.  Pt denies burning with urination or bladder pain.  Pt explained how to obtain a self swab.  Urinalysis shows small leukocytes.  Pt advised that we will call with abnormal results within 24-48 hrs and that it will take 72 hrs for urine culture results.  Pt verbalized understanding.   Mel Almond, RN  10/29/19  Chart reviewed for nurse visit. Agree with plan of care.   Virginia Rochester, NP 10/29/2019 5:11 PM

## 2019-10-29 NOTE — Patient Instructions (Signed)
Bacterial Vaginosis  Bacterial vaginosis is a vaginal infection that occurs when the normal balance of bacteria in the vagina is disrupted. It results from an overgrowth of certain bacteria. This is the most common vaginal infection among women ages 15-44. Because bacterial vaginosis increases your risk for STIs (sexually transmitted infections), getting treated can help reduce your risk for chlamydia, gonorrhea, herpes, and HIV (human immunodeficiency virus). Treatment is also important for preventing complications in pregnant women, because this condition can cause an early (premature) delivery. What are the causes? This condition is caused by an increase in harmful bacteria that are normally present in small amounts in the vagina. However, the reason that the condition develops is not fully understood. What increases the risk? The following factors may make you more likely to develop this condition:  Having a new sexual partner or multiple sexual partners.  Having unprotected sex.  Douching.  Having an intrauterine device (IUD).  Smoking.  Drug and alcohol abuse.  Taking certain antibiotic medicines.  Being pregnant. You cannot get bacterial vaginosis from toilet seats, bedding, swimming pools, or contact with objects around you. What are the signs or symptoms? Symptoms of this condition include:  Grey or white vaginal discharge. The discharge can also be watery or foamy.  A fish-like odor with discharge, especially after sexual intercourse or during menstruation.  Itching in and around the vagina.  Burning or pain with urination. Some women with bacterial vaginosis have no signs or symptoms. How is this diagnosed? This condition is diagnosed based on:  Your medical history.  A physical exam of the vagina.  Testing a sample of vaginal fluid under a microscope to look for a large amount of bad bacteria or abnormal cells. Your health care provider may use a cotton swab or  a small wooden spatula to collect the sample. How is this treated? This condition is treated with antibiotics. These may be given as a pill, a vaginal cream, or a medicine that is put into the vagina (suppository). If the condition comes back after treatment, a second round of antibiotics may be needed. Follow these instructions at home: Medicines  Take over-the-counter and prescription medicines only as told by your health care provider.  Take or use your antibiotic as told by your health care provider. Do not stop taking or using the antibiotic even if you start to feel better. General instructions  If you have a female sexual partner, tell her that you have a vaginal infection. She should see her health care provider and be treated if she has symptoms. If you have a female sexual partner, he does not need treatment.  During treatment: ? Avoid sexual activity until you finish treatment. ? Do not douche. ? Avoid alcohol as directed by your health care provider. ? Avoid breastfeeding as directed by your health care provider.  Drink enough water and fluids to keep your urine clear or pale yellow.  Keep the area around your vagina and rectum clean. ? Wash the area daily with warm water. ? Wipe yourself from front to back after using the toilet.  Keep all follow-up visits as told by your health care provider. This is important. How is this prevented?  Do not douche.  Wash the outside of your vagina with warm water only.  Use protection when having sex. This includes latex condoms and dental dams.  Limit how many sexual partners you have. To help prevent bacterial vaginosis, it is best to have sex with just one partner (  monogamous).  Make sure you and your sexual partner are tested for STIs.  Wear cotton or cotton-lined underwear.  Avoid wearing tight pants and pantyhose, especially during summer.  Limit the amount of alcohol that you drink.  Do not use any products that contain  nicotine or tobacco, such as cigarettes and e-cigarettes. If you need help quitting, ask your health care provider.  Do not use illegal drugs. Where to find more information  Centers for Disease Control and Prevention: www.cdc.gov/std  American Sexual Health Association (ASHA): www.ashastd.org  U.S. Department of Health and Human Services, Office on Women's Health: www.womenshealth.gov/ or https://www.womenshealth.gov/a-z-topics/bacterial-vaginosis Contact a health care provider if:  Your symptoms do not improve, even after treatment.  You have more discharge or pain when urinating.  You have a fever.  You have pain in your abdomen.  You have pain during sex.  You have vaginal bleeding between periods. Summary  Bacterial vaginosis is a vaginal infection that occurs when the normal balance of bacteria in the vagina is disrupted.  Because bacterial vaginosis increases your risk for STIs (sexually transmitted infections), getting treated can help reduce your risk for chlamydia, gonorrhea, herpes, and HIV (human immunodeficiency virus). Treatment is also important for preventing complications in pregnant women, because the condition can cause an early (premature) delivery.  This condition is treated with antibiotic medicines. These may be given as a pill, a vaginal cream, or a medicine that is put into the vagina (suppository). This information is not intended to replace advice given to you by your health care provider. Make sure you discuss any questions you have with your health care provider. Document Revised: 04/14/2017 Document Reviewed: 01/16/2016 Elsevier Patient Education  2020 Elsevier Inc.  

## 2019-10-30 LAB — URINE CULTURE

## 2019-10-30 LAB — CERVICOVAGINAL ANCILLARY ONLY
Bacterial Vaginitis (gardnerella): POSITIVE — AB
Candida Glabrata: NEGATIVE
Candida Vaginitis: NEGATIVE
Chlamydia: NEGATIVE
Comment: NEGATIVE
Comment: NEGATIVE
Comment: NEGATIVE
Comment: NEGATIVE
Comment: NEGATIVE
Comment: NORMAL
Neisseria Gonorrhea: NEGATIVE
Trichomonas: NEGATIVE

## 2019-10-31 ENCOUNTER — Telehealth (INDEPENDENT_AMBULATORY_CARE_PROVIDER_SITE_OTHER): Payer: Self-pay

## 2019-10-31 DIAGNOSIS — B9689 Other specified bacterial agents as the cause of diseases classified elsewhere: Secondary | ICD-10-CM

## 2019-10-31 DIAGNOSIS — N760 Acute vaginitis: Secondary | ICD-10-CM

## 2019-10-31 MED ORDER — METRONIDAZOLE 500 MG PO TABS: 500.0000 mg | ORAL_TABLET | Freq: Two times a day (BID) | ORAL | 0 refills | Status: DC

## 2019-10-31 MED ORDER — FLUCONAZOLE 150 MG PO TABS: 150.0000 mg | ORAL_TABLET | Freq: Once | ORAL | 0 refills | Status: AC

## 2019-11-01 NOTE — Telephone Encounter (Signed)
Pt called front office staff and requested antibiotic for infection. Called pt. Per chart review pt recently tested positive for BV. Flagyl prescribed per protocol and pt given Diflucan rx to follow antibiotic treatment if needed. Pt given instructions to take with food and avoid alcohol.

## 2019-11-04 ENCOUNTER — Telehealth (INDEPENDENT_AMBULATORY_CARE_PROVIDER_SITE_OTHER): Payer: Medicaid Other | Admitting: Family Medicine

## 2019-11-04 DIAGNOSIS — R0781 Pleurodynia: Secondary | ICD-10-CM

## 2019-11-04 NOTE — Telephone Encounter (Signed)
Patient is calling requesting a order be sent to the [facility], having breast issues

## 2019-11-05 NOTE — Telephone Encounter (Signed)
Called pt. Pt reports right breast is swollen and has "dropped down lower than left breast." Patient is concerned because this has never happened before. Would like to be seen at [facility], is not currently a patient there. Endorses pain that occurred once in the right breast. Denies any redness or other visual changes. Reports breastfeeding over 2 years ago. Experienced nipple discharge from 1 breast approx. 7 months ago but cannot recall which breast. Explained to pt I will consult with a provider and someone will reach out to her with an appointment in the next few days. Explained that I cannot schedule [facility] with [facility] without a referral from a provider.

## 2019-11-06 NOTE — Telephone Encounter (Signed)
Spoke with [place], DO who states this is not an urgent need but that patient should be seen in the office in the next few days. [place] office scheduled appt for 11/12/19 at 0800. Called pt to notify her of provider recommendation and appt time.

## 2019-11-12 ENCOUNTER — Other Ambulatory Visit (HOSPITAL_COMMUNITY)
Admission: RE | Admit: 2019-11-12 | Discharge: 2019-11-12 | Disposition: A | Discharge status: Home / Self Care | Payer: Medicaid Other | Source: Ambulatory Visit | Attending: Student | Admitting: Student

## 2019-11-12 ENCOUNTER — Encounter: Payer: Self-pay | Admitting: Student

## 2019-11-12 ENCOUNTER — Ambulatory Visit (INDEPENDENT_AMBULATORY_CARE_PROVIDER_SITE_OTHER): Payer: Medicaid Other | Admitting: Student

## 2019-11-12 ENCOUNTER — Other Ambulatory Visit: Payer: Self-pay

## 2019-11-12 VITALS — BP 114/80 | HR 79 | Ht 65.0 in | Wt 250.0 lb

## 2019-11-12 DIAGNOSIS — Z0000 Encounter for general adult medical examination without abnormal findings: Secondary | ICD-10-CM

## 2019-11-12 DIAGNOSIS — N898 Other specified noninflammatory disorders of vagina: Secondary | ICD-10-CM | POA: Insufficient documentation

## 2019-11-12 NOTE — Patient Instructions (Signed)
[place]  [phone]

## 2019-11-12 NOTE — Progress Notes (Signed)
°  History:  [name] is a 29 y.o. G1P1001 who presents to clinic today for concerns about her breast swelling that started 4 weeks. Her period was a week late, and then she noticed that her right breast was more swollen than the other.   She reports that she had pain "one time" on her right breast. It has not returned. She noticed this after she did jumping jacks. She denies discharge, redness, knots. SHe did a self breast exam and didn't feel anything concerning. She looked on Google and read that swelling is a sign of breast cancer so she wanted to be checked out.    She also mentions that she has had increased urination lately, which is a sign of BV to her. She denies UTI symptoms; would like to be self-swabbed for BV again.   She would also like to see a family practice doctor for a referral to GI for gastric sleeve surgery.   The following portions of the patient's history were reviewed and updated as appropriate: allergies, current medications, family history, past medical history, social history, past surgical history and problem list.  Review of Systems:  Review of Systems  Constitutional: Negative.   HENT: Negative.   Respiratory: Negative.   Cardiovascular: Negative.   Genitourinary: Negative.   Musculoskeletal: Negative.   Skin: Negative.   Neurological: Negative.       Objective:  Physical Exam BP 114/80    Pulse 79    Ht 5\' 5"  (1.651 m)    Wt 250 lb (113.4 kg)    LMP 11/03/2019 (Approximate)    BMI 41.60 kg/m  Physical Exam Constitutional:      Appearance: Normal appearance.  HENT:     Head: Normocephalic.  Cardiovascular:     Rate and Rhythm: Normal rate.  Pulmonary:     Effort: Pulmonary effort is normal.  Abdominal:     General: Abdomen is flat.  Musculoskeletal:        General: Normal range of motion.  Skin:    General: Skin is warm.  Neurological:     General: No focal deficit present.     Mental Status: She is alert.   Breasts are equal  in size and symmetry; no pain, no lumps, masses, dimpling or discharge.   Speculum exam not done  Labs and Imaging No results found for this or any previous visit (from the past 24 hour(s)).  No results found.   Assessment & Plan:   1. Vaginal discharge   2. Normal breast exam    -Reassured patient of normalcy of breast findings and exam; encouraged her to keep working out and that she probably strained a muscle while working out.  -Will do repeat swab to check for yeast/bv.  -all questions answered.  -Number given for Family Practice for patient to follow up. Once she established PCP, she will pursue gastric sleeve surgery.   Approximately 15 minutes of face-to-face time was spent with this patient   Melanie Cisneros, CNM 11/12/2019 8:25 AM

## 2019-11-13 LAB — CERVICOVAGINAL ANCILLARY ONLY
Bacterial Vaginitis (gardnerella): NEGATIVE
Candida Glabrata: NEGATIVE
Candida Vaginitis: NEGATIVE
Chlamydia: NEGATIVE
Comment: NEGATIVE
Comment: NEGATIVE
Comment: NEGATIVE
Comment: NEGATIVE
Comment: NEGATIVE
Comment: NORMAL
Neisseria Gonorrhea: NEGATIVE
Trichomonas: NEGATIVE

## 2019-12-31 ENCOUNTER — Other Ambulatory Visit: Payer: Self-pay

## 2019-12-31 ENCOUNTER — Ambulatory Visit (INDEPENDENT_AMBULATORY_CARE_PROVIDER_SITE_OTHER): Payer: Medicaid Other

## 2019-12-31 ENCOUNTER — Other Ambulatory Visit (HOSPITAL_COMMUNITY)
Admission: RE | Admit: 2019-12-31 | Discharge: 2019-12-31 | Disposition: A | Discharge status: Home / Self Care | Payer: Medicaid Other | Source: Ambulatory Visit | Attending: Obstetrics and Gynecology | Admitting: Obstetrics and Gynecology

## 2019-12-31 DIAGNOSIS — N898 Other specified noninflammatory disorders of vagina: Secondary | ICD-10-CM | POA: Insufficient documentation

## 2019-12-31 DIAGNOSIS — Z3202 Encounter for pregnancy test, result negative: Secondary | ICD-10-CM | POA: Diagnosis not present

## 2019-12-31 DIAGNOSIS — Z3200 Encounter for pregnancy test, result unknown: Secondary | ICD-10-CM

## 2019-12-31 DIAGNOSIS — Z113 Encounter for screening for infections with a predominantly sexual mode of transmission: Secondary | ICD-10-CM | POA: Insufficient documentation

## 2019-12-31 NOTE — Progress Notes (Signed)
Pt here today with complaint of vaginal dryness and irritation. 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 Requests work up for [place]; explained that pt will need provider visit for this. Pt agreeable to vaginal swab today to test for infection and to schedule provider visit to follow up. Encouraged pt to try some moisturizer to vaginal area either with personal lubricant or coconut oil.   Pt requests full STD testing today. Explained office will call pt with any abnormal results. Vaginal self-swab instructions given and specimen obtained. Labs drawn.   Pt requests urine pregnancy test; result is negative.  Apolonio Schneiders RN  12/31/19

## 2020-01-01 LAB — RPR: RPR Ser Ql: NONREACTIVE

## 2020-01-01 LAB — HEPATITIS C ANTIBODY: Hep C Virus Ab: <0.1 s/co ratio (ref 0.0–0.9)

## 2020-01-01 LAB — HIV ANTIBODY (ROUTINE TESTING W REFLEX): HIV Screen 4th Generation wRfx: NONREACTIVE

## 2020-01-01 LAB — POCT PREGNANCY, URINE: Preg Test, Ur: NEGATIVE

## 2020-01-01 LAB — HEPATITIS B SURFACE ANTIGEN: Hepatitis B Surface Ag: NEGATIVE

## 2020-01-01 NOTE — Progress Notes (Signed)
Patient was assessed and managed by nursing staff during this encounter. I have reviewed the chart and agree with the documentation and plan. I have also made any necessary editorial changes.  Griffin Basil, MD 01/01/2020 11:49 AM

## 2020-01-02 LAB — CERVICOVAGINAL ANCILLARY ONLY
Bacterial Vaginitis (gardnerella): POSITIVE — AB
Candida Glabrata: NEGATIVE
Candida Vaginitis: NEGATIVE
Chlamydia: NEGATIVE
Comment: NEGATIVE
Comment: NEGATIVE
Comment: NEGATIVE
Comment: NEGATIVE
Comment: NEGATIVE
Comment: NORMAL
Neisseria Gonorrhea: NEGATIVE
Trichomonas: NEGATIVE

## 2020-01-06 ENCOUNTER — Telehealth: Payer: Self-pay | Admitting: *Deleted

## 2020-01-06 DIAGNOSIS — N760 Acute vaginitis: Secondary | ICD-10-CM

## 2020-01-06 MED ORDER — METRONIDAZOLE 500 MG PO TABS: 500.0000 mg | ORAL_TABLET | Freq: Two times a day (BID) | ORAL | 0 refills | Status: DC

## 2020-01-06 MED ORDER — FLUCONAZOLE 150 MG PO TABS: 150.0000 mg | ORAL_TABLET | Freq: Once | ORAL | 0 refills | Status: AC

## 2020-01-06 NOTE — Telephone Encounter (Addendum)
-----   Message from Griffin Basil, MD sent at 01/06/2020 10:53 AM EDT ----- BV noted on  swab offer treatment flagyl 500 mg disp # 14 1 tab po bid x 7 days  8/23  1140  Called pt and informed her of test results and treatment can be sent to her pharmacy. Pt requested Rx for yeast also as she always has this problem after treatment for BV. Rx sent for diflucan per standing order. Pt was advised not to take the diflucan until Rx for flagyl is completed. If she is having sx of yeast 3 days after taking diflucan, she should call us back. Pt has office appt on 9/13 re: recurrent BV. She voiced understanding of all information and instructions given.

## 2020-01-27 ENCOUNTER — Other Ambulatory Visit (HOSPITAL_COMMUNITY)
Admission: RE | Admit: 2020-01-27 | Discharge: 2020-01-27 | Disposition: A | Discharge status: Home / Self Care | Payer: Medicaid Other | Source: Ambulatory Visit | Attending: Student | Admitting: Student

## 2020-01-27 ENCOUNTER — Ambulatory Visit: Payer: Medicaid Other | Admitting: Student

## 2020-01-27 ENCOUNTER — Other Ambulatory Visit: Payer: Self-pay

## 2020-01-27 ENCOUNTER — Encounter: Payer: Self-pay | Admitting: Student

## 2020-01-27 ENCOUNTER — Ambulatory Visit (INDEPENDENT_AMBULATORY_CARE_PROVIDER_SITE_OTHER): Payer: Medicaid Other | Admitting: Student

## 2020-01-27 VITALS — BP 127/89 | HR 79 | Ht 65.0 in | Wt 276.0 lb

## 2020-01-27 DIAGNOSIS — N760 Acute vaginitis: Secondary | ICD-10-CM | POA: Insufficient documentation

## 2020-01-27 DIAGNOSIS — B9689 Other specified bacterial agents as the cause of diseases classified elsewhere: Secondary | ICD-10-CM | POA: Insufficient documentation

## 2020-01-27 LAB — POCT PREGNANCY, URINE: Preg Test, Ur: NEGATIVE

## 2020-01-27 NOTE — Patient Instructions (Signed)
Soak a tampon in a 1/4 (quarter cup) of melted coconut oil and 3 drops of tea tea oil; insert and leave in over night. Repeat for two more nights.

## 2020-01-27 NOTE — Progress Notes (Signed)
  History:  [name] is a 29 y.o. G1P1001 who presents to clinic today for complaints about BV. She last had BV on 8-17 and reports that her treatment is finished but she still feels symptomatic. Symptoms include dryness, using the bathroom a lot, not "feeling right". She also wants STI testing. She does not want any BC. She finished her flagyl but does not want to take Flagyl again. She is interested in other methods for treating BV.   The following portions of the patient's history were reviewed and updated as appropriate: allergies, current medications, family history, past medical history, social history, past surgical history and problem list.  Review of Systems:  Review of Systems  Constitutional: Negative.   HENT: Negative.   Respiratory: Negative.   Cardiovascular: Negative.   Gastrointestinal: Negative.   Genitourinary: Negative.   Skin: Negative.   Neurological: Negative.       Objective:  Physical Exam BP 127/89   Pulse 79   Ht 5\' 5"  (1.651 m)   Wt 276 lb (125.2 kg)   LMP 01/17/2020   BMI 45.93 kg/m  Physical Exam Cardiovascular:     Rate and Rhythm: Normal rate.  Pulmonary:     Effort: Pulmonary effort is normal.  Abdominal:     General: Abdomen is flat.  Genitourinary:    General: Normal vulva.     Vagina: No vaginal discharge.     Comments: Mucousy clear  discharge in the vagina, no odor, no CMT, no redness on vaginal walls Musculoskeletal:        General: Normal range of motion.     Cervical back: Normal range of motion.  Neurological:     Mental Status: She is alert.       Labs and Imaging Results for orders placed or performed in visit on 01/27/20 (from the past 24 hour(s))  Pregnancy, urine POC     Status: None   Collection Time: 01/27/20  4:20 PM  Result Value Ref Range   Preg Test, Ur NEGATIVE NEGATIVE    No results found.   Assessment & Plan:   1. BV (bacterial vaginosis)   -pregnancy test is negative today -patient will  try homeopathic treatment, asked her to keep track of when she has intercourse, ovulation, and BV symptoms to try to determine triggers.  -will do GC CT testing Approximately 20 minutes of total time was spent with this patient on counseling and care.   [place]arr [place], [place] 01/27/2020 4:48 PM

## 2020-01-28 ENCOUNTER — Telehealth: Payer: Self-pay | Admitting: Student

## 2020-01-28 LAB — CERVICOVAGINAL ANCILLARY ONLY
Bacterial Vaginitis (gardnerella): NEGATIVE
Candida Glabrata: NEGATIVE
Candida Vaginitis: NEGATIVE
Chlamydia: NEGATIVE
Comment: NEGATIVE
Comment: NEGATIVE
Comment: NEGATIVE
Comment: NEGATIVE
Comment: NEGATIVE
Comment: NORMAL
Neisseria Gonorrhea: NEGATIVE
Trichomonas: NEGATIVE

## 2020-01-28 NOTE — Telephone Encounter (Signed)
Called patient and left message about her results and no need for BV treatment. Will send My Chart message as well.  Melanie Cisneros

## 2020-03-04 ENCOUNTER — Ambulatory Visit (INDEPENDENT_AMBULATORY_CARE_PROVIDER_SITE_OTHER): Payer: Medicaid Other | Admitting: Podiatry

## 2020-03-04 ENCOUNTER — Ambulatory Visit (INDEPENDENT_AMBULATORY_CARE_PROVIDER_SITE_OTHER): Payer: Medicaid Other

## 2020-03-04 ENCOUNTER — Ambulatory Visit: Payer: Medicaid Other | Admitting: Podiatry

## 2020-03-04 ENCOUNTER — Other Ambulatory Visit: Payer: Self-pay

## 2020-03-04 DIAGNOSIS — M2042 Other hammer toe(s) (acquired), left foot: Secondary | ICD-10-CM

## 2020-03-04 DIAGNOSIS — M2041 Other hammer toe(s) (acquired), right foot: Secondary | ICD-10-CM

## 2020-03-04 DIAGNOSIS — B351 Tinea unguium: Secondary | ICD-10-CM | POA: Diagnosis not present

## 2020-03-04 DIAGNOSIS — M79674 Pain in right toe(s): Secondary | ICD-10-CM

## 2020-03-04 DIAGNOSIS — M79675 Pain in left toe(s): Secondary | ICD-10-CM | POA: Diagnosis not present

## 2020-03-04 NOTE — Progress Notes (Signed)
   HPI: 29 y.o. female presenting today as a new patient referral from Dr. Gershon Mussel, local podiatrist for evaluation of pain and tenderness to the fifth toes bilateral.  Patient works standing on her feet all day at [employer].  She states that her fifth toes are very sore and they developed thick hyperkeratotic calluses.  She was referred here for surgical intervention and she states that she was diagnosed with bone spurs.  Past Medical History:  Diagnosis Date  . Bacterial vaginosis   . Chlamydia   . Gonorrhea   . Obesity   . UTI (lower urinary tract infection)       Objective: Physical Exam General: The patient is alert and oriented x3 in no acute distress.  Dermatology: Skin is cool, dry and supple bilateral lower extremities. Negative for open lesions or macerations.  Hyperkeratotic calluses overlying the PIPJ of the fifth digit bilateral  Vascular: Palpable pedal pulses bilaterally. No edema or erythema noted. Capillary refill within normal limits.  Neurological: Epicritic and protective threshold grossly intact bilaterally.   Musculoskeletal Exam: All pedal and ankle joints range of motion within normal limits bilateral. Muscle strength 5/5 in all groups bilateral. Hammertoe contracture deformity noted to digits fifth digits of the bilateral foot.  Radiographic Exam: Hammertoe contracture deformity noted to the interphalangeal joints and MPJ of the respective hammertoe digits mentioned on clinical musculoskeletal exam.     Assessment: 1.  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 Patient evaluated. X-Rays reviewed.  2. Today we discussed the conservative versus surgical management of the presenting pathology. The patient opts for surgical management. All possible complications and details of the procedure were explained. All patient questions were answered. No guarantees were expressed or implied. 3. Authorization for surgery was initiated  today. Surgery will consist of PIPJ arthroplasty with derotational skinplasty fifth digit bilateral 4.  Message was sent to our billing specialist to contact the patient in regards to her financial cost and obligation of surgery 5.  Return to clinic 1 week postop  *works at [employer], DPM [facility]  Dr. Edrick Kins, DPM    [address], [place] [address]                Office [phone]  Fax [phone]

## 2020-03-06 ENCOUNTER — Other Ambulatory Visit: Payer: Self-pay | Admitting: Family Medicine

## 2020-03-06 DIAGNOSIS — B9689 Other specified bacterial agents as the cause of diseases classified elsewhere: Secondary | ICD-10-CM

## 2020-03-06 MED ORDER — METRONIDAZOLE 500 MG PO TABS: 500.0000 mg | ORAL_TABLET | Freq: Two times a day (BID) | ORAL | 0 refills | Status: DC

## 2020-03-06 NOTE — Progress Notes (Signed)
Has BV--requests meds-->refill sent in

## 2020-03-10 ENCOUNTER — Encounter: Payer: Self-pay | Admitting: Podiatry

## 2020-03-10 ENCOUNTER — Ambulatory Visit: Payer: Medicaid Other | Admitting: Student

## 2020-03-19 ENCOUNTER — Telehealth: Payer: Self-pay

## 2020-03-19 NOTE — Telephone Encounter (Addendum)
DOS 03/26/2020  HAMMERTOE REPAIR 5TH B/L - [id number]  RECEIVED CALL FROM JENNA AT [facility]. SHE STATED NO PRECERT WAS REQUIRED FOR CPT [phone]. REF# [id number]

## 2020-03-26 ENCOUNTER — Other Ambulatory Visit: Payer: Self-pay | Admitting: Podiatry

## 2020-03-26 DIAGNOSIS — M2041 Other hammer toe(s) (acquired), right foot: Secondary | ICD-10-CM | POA: Diagnosis not present

## 2020-03-26 DIAGNOSIS — M2042 Other hammer toe(s) (acquired), left foot: Secondary | ICD-10-CM | POA: Diagnosis not present

## 2020-03-26 MED ORDER — OXYCODONE-ACETAMINOPHEN 5-325 MG PO TABS: 1.0000 | ORAL_TABLET | ORAL | 0 refills | Status: DC | PRN

## 2020-03-26 MED ORDER — IBUPROFEN 800 MG PO TABS: 800.0000 mg | ORAL_TABLET | Freq: Three times a day (TID) | ORAL | 1 refills | Status: DC

## 2020-03-26 NOTE — Progress Notes (Signed)
PRN postop 

## 2020-03-27 ENCOUNTER — Telehealth: Payer: Self-pay | Admitting: Podiatry

## 2020-03-27 NOTE — Telephone Encounter (Signed)
Mrs. Melanie Cisneros is calling, she said that the pain she is in is so bad. She has been taking the Percocet every 4 hours and she was in tears last night., due to the extreme pain. She would also like a walker or something to help with getting around.

## 2020-04-01 ENCOUNTER — Other Ambulatory Visit: Payer: Self-pay

## 2020-04-01 ENCOUNTER — Ambulatory Visit (INDEPENDENT_AMBULATORY_CARE_PROVIDER_SITE_OTHER): Payer: Medicaid Other | Admitting: Podiatry

## 2020-04-01 ENCOUNTER — Ambulatory Visit (INDEPENDENT_AMBULATORY_CARE_PROVIDER_SITE_OTHER): Payer: Medicaid Other

## 2020-04-01 DIAGNOSIS — M2041 Other hammer toe(s) (acquired), right foot: Secondary | ICD-10-CM

## 2020-04-01 DIAGNOSIS — M79674 Pain in right toe(s): Secondary | ICD-10-CM

## 2020-04-01 DIAGNOSIS — B351 Tinea unguium: Secondary | ICD-10-CM

## 2020-04-01 DIAGNOSIS — M2042 Other hammer toe(s) (acquired), left foot: Secondary | ICD-10-CM | POA: Diagnosis not present

## 2020-04-01 DIAGNOSIS — M79675 Pain in left toe(s): Secondary | ICD-10-CM

## 2020-04-01 DIAGNOSIS — Z9889 Other specified postprocedural states: Secondary | ICD-10-CM

## 2020-04-01 MED ORDER — TERBINAFINE HCL 250 MG PO TABS: 250.0000 mg | ORAL_TABLET | Freq: Every day | ORAL | 0 refills | Status: DC

## 2020-04-01 NOTE — Progress Notes (Signed)
   Subjective:  Patient presents today status post fifth toe arthroplasty bilateral/middle phalangectomy. DOS: 03/26/2020.  Patient states she is doing well.  The pain is tolerable.  She has been weightbearing and postsurgical shoes as instructed. Patient also complains of dystrophic thickened discolored nails to the bilateral great toes secondary to pedicure.  She would like to know different treatment options for the toenails.  She believes there is fungus now.  Past Medical History:  Diagnosis Date  . Bacterial vaginosis   . Chlamydia   . Gonorrhea   . Obesity   . UTI (lower urinary tract infection)       Objective/Physical Exam Neurovascular status intact.  Skin incisions appear to be well coapted with sutures  intact. No sign of infectious process noted. No dehiscence. No active bleeding noted. Moderate edema noted to the surgical extremity.  There is some hyperkeratotic dystrophic discolored nails noted to the bilateral great toenails consistent with findings of onychomycosis of the nail plates.  Radiographic Exam:  Absence of the middle phalanx of the bilateral fifth toes noted.  Toes appear to be in a good rectus alignment.  Assessment: 1. s/p fifth toe middle phalangectomy bilateral. DOS: 03/26/2020 2.  Onychomycosis of toenails bilateral great toes   Plan of Care:  1. Patient was evaluated. X-rays reviewed 2.  Dressings changed today.  Recommend antibiotic ointment and a Band-Aid daily 3.  Continue postsurgical shoes 4.  Prescription for Lamisil 200 mg #90 daily.  Patient denies any liver pathology or symptoms.  Patient otherwise healthy. 5.  Recommend OTC urea topical to use as a nail softener  6.  Return to clinic in 1 week   Edrick Kins, DPM [facility]  Dr. Edrick Kins, DPM    [address], [place] [address]                Office [phone]  Fax [phone]

## 2020-04-08 ENCOUNTER — Ambulatory Visit (INDEPENDENT_AMBULATORY_CARE_PROVIDER_SITE_OTHER): Payer: Medicaid Other | Admitting: Podiatry

## 2020-04-08 ENCOUNTER — Other Ambulatory Visit: Payer: Self-pay

## 2020-04-08 ENCOUNTER — Encounter: Payer: Self-pay | Admitting: Podiatry

## 2020-04-08 DIAGNOSIS — M2042 Other hammer toe(s) (acquired), left foot: Secondary | ICD-10-CM

## 2020-04-08 DIAGNOSIS — M2041 Other hammer toe(s) (acquired), right foot: Secondary | ICD-10-CM

## 2020-04-08 DIAGNOSIS — Z9889 Other specified postprocedural states: Secondary | ICD-10-CM

## 2020-04-08 NOTE — Progress Notes (Signed)
   Subjective:  Patient presents today status post fifth toe arthroplasty bilateral/middle phalangectomy. DOS: 03/26/2020.  Patient states that she is doing well.  No new complaints at this time Past Medical History:  Diagnosis Date  . Bacterial vaginosis   . Chlamydia   . Gonorrhea   . Obesity   . UTI (lower urinary tract infection)       Objective/Physical Exam Neurovascular status intact.  Skin incisions appear to be well coapted with sutures  intact. No sign of infectious process noted. No dehiscence. No active bleeding noted. Moderate edema noted to the surgical extremity.  There is some hyperkeratotic dystrophic discolored nails noted to the bilateral great toenails consistent with findings of onychomycosis of the nail plates.  Radiographic Exam:  Absence of the middle phalanx of the bilateral fifth toes noted.  Toes appear to be in a good rectus alignment.  Assessment: 1. s/p fifth toe middle phalangectomy bilateral. DOS: 03/26/2020 2.  Onychomycosis of toenails bilateral great toes   Plan of Care:  1. Patient was evaluated.  2.  Sutures removed today.  Recommend antibiotic ointment and a Band-Aid daily  3.  Discontinue postsurgical shoes.  Patient may resume good supportive tennis shoes 4.  Continue Lamisil 250 mg #90 daily as prescribed.  Patient denies any liver pathology or symptoms.  Patient otherwise healthy. 5.  Recommend OTC urea topical to use as a nail softener  6.  Return to clinic in 1 week   Edrick Kins, DPM [facility]  Dr. Edrick Kins, DPM    [address], [place] [address]                Office [phone]  Fax [phone]

## 2020-04-14 ENCOUNTER — Ambulatory Visit (INDEPENDENT_AMBULATORY_CARE_PROVIDER_SITE_OTHER): Payer: Medicaid Other | Admitting: *Deleted

## 2020-04-14 ENCOUNTER — Encounter: Payer: Self-pay | Admitting: *Deleted

## 2020-04-14 ENCOUNTER — Other Ambulatory Visit (HOSPITAL_COMMUNITY)
Admission: RE | Admit: 2020-04-14 | Discharge: 2020-04-14 | Disposition: A | Discharge status: Home / Self Care | Payer: Medicaid Other | Source: Ambulatory Visit | Attending: Family Medicine | Admitting: Family Medicine

## 2020-04-14 ENCOUNTER — Other Ambulatory Visit: Payer: Self-pay

## 2020-04-14 VITALS — BP 134/80 | HR 78 | Ht 65.0 in | Wt 289.5 lb

## 2020-04-14 DIAGNOSIS — N898 Other specified noninflammatory disorders of vagina: Secondary | ICD-10-CM | POA: Diagnosis not present

## 2020-04-14 DIAGNOSIS — R350 Frequency of micturition: Secondary | ICD-10-CM

## 2020-04-14 LAB — POCT URINALYSIS DIP (DEVICE)
Bilirubin Urine: NEGATIVE
Glucose, UA: NEGATIVE mg/dL
Hgb urine dipstick: NEGATIVE
Ketones, ur: NEGATIVE mg/dL
Nitrite: NEGATIVE
Protein, ur: NEGATIVE mg/dL
Specific Gravity, Urine: >=1.03 (ref 1.005–1.030)
Urobilinogen, UA: 0.2 mg/dL (ref 0.0–1.0)
pH: 5.5 (ref 5.0–8.0)

## 2020-04-14 NOTE — Progress Notes (Signed)
Pt reports having vaginal odor and small amount of discharge. Pt recently had foot surgery and received antibiotics. She believes she has BV again because her symptoms are the same. She also reports urinary frequency and has concerns for UTI. Urinalysis shows small Leukocytes. Due to pt allergic to PCN and ampicillin, urine will be sent to lab for culture and sensitivities. Self swab was obtained for wet prep and STI testing. Pt advised that she will be notified of test results and treatment if any via Mychart. Due to pt's issues with recurrent BV, I advised that she schedule follow up with a physician for further evaluation and consultation. She agreed and will schedule appt upon check out today.

## 2020-04-14 NOTE — Progress Notes (Signed)
Chart reviewed for nurse visit. Agree with plan of care.   Clarnce Flock, MD 04/14/20 11:33 AM

## 2020-04-15 LAB — CERVICOVAGINAL ANCILLARY ONLY
Bacterial Vaginitis (gardnerella): NEGATIVE
Candida Glabrata: NEGATIVE
Candida Vaginitis: NEGATIVE
Chlamydia: NEGATIVE
Comment: NEGATIVE
Comment: NEGATIVE
Comment: NEGATIVE
Comment: NEGATIVE
Comment: NEGATIVE
Comment: NORMAL
Neisseria Gonorrhea: NEGATIVE
Trichomonas: NEGATIVE

## 2020-04-16 ENCOUNTER — Telehealth: Payer: Self-pay | Admitting: Obstetrics & Gynecology

## 2020-04-16 LAB — URINE CULTURE

## 2020-04-16 NOTE — Telephone Encounter (Signed)
Patient called to say she had some questions about her test results she had in her MyChart.

## 2020-04-16 NOTE — Telephone Encounter (Signed)
I called patient back and she states she is trying to understand if her urine culture is negative or not. I reviewed her result and informed her was negative. She states she sees her wet prep is negative also ; but still having discharge, pain. I advised her to make appointment with provider which she said she did and it is Monday  12/ 6/21. She voices understanding. Rajan Burgard,RN

## 2020-04-20 ENCOUNTER — Ambulatory Visit: Payer: Medicaid Other | Admitting: Obstetrics and Gynecology

## 2020-04-22 ENCOUNTER — Encounter: Payer: Medicaid Other | Admitting: Podiatry

## 2020-04-27 ENCOUNTER — Encounter (HOSPITAL_COMMUNITY): Payer: Self-pay | Admitting: Emergency Medicine

## 2020-04-27 ENCOUNTER — Other Ambulatory Visit: Payer: Self-pay

## 2020-04-27 ENCOUNTER — Ambulatory Visit (HOSPITAL_COMMUNITY)
Admission: EM | Admit: 2020-04-27 | Discharge: 2020-04-27 | Disposition: A | Discharge status: Home / Self Care | Payer: Medicaid Other | Attending: Internal Medicine | Admitting: Internal Medicine

## 2020-04-27 DIAGNOSIS — N898 Other specified noninflammatory disorders of vagina: Secondary | ICD-10-CM | POA: Diagnosis not present

## 2020-04-27 LAB — POCT URINALYSIS DIPSTICK, ED / UC
Bilirubin Urine: NEGATIVE
Glucose, UA: NEGATIVE mg/dL
Hgb urine dipstick: NEGATIVE
Ketones, ur: NEGATIVE mg/dL
Nitrite: NEGATIVE
Protein, ur: NEGATIVE mg/dL
Specific Gravity, Urine: 1.025 (ref 1.005–1.030)
Urobilinogen, UA: 0.2 mg/dL (ref 0.0–1.0)
pH: 6 (ref 5.0–8.0)

## 2020-04-27 NOTE — ED Provider Notes (Signed)
[place]    CSN: [id number] Arrival date & time: 04/27/20  0803      History   Chief Complaint Chief Complaint  Patient presents with   Vaginal Discharge    HPI Melanie Cisneros is a 29 y.o. female who presents with recurrent vaginal discharge and irritation x for the past 2 weeks. Had self swab vaginal test with a midwife 11/30 and UA and came negative. She did not have a pelvic exam. She is in a monogamous relationship with her daughter's father x 5 years. Not using birth control since. LMP 12/1. Has been having increased vaginal discharge for the past few days.  Denies vaginal pain or lesions.    Past Medical History:  Diagnosis Date   Bacterial vaginosis    Chlamydia    Gonorrhea    Obesity    UTI (lower urinary tract infection)     Patient Active Problem List   Diagnosis Date Noted   Indication for care in labor or delivery 06/12/2016   BMI 45.0-49.9, adult ([place]) 06/06/2016   Psychosocial stressors 02/25/2016   Ptyalism 02/25/2016   Drug use affecting pregnancy in second trimester 01/27/2016   Rubella non-immune status, antepartum 01/27/2016   Supervision of high risk pregnancy in third trimester 11/20/2015    Past Surgical History:  Procedure Laterality Date   WISDOM TOOTH EXTRACTION      OB History    Gravida  1   Para  1   Term  1   Preterm      AB      Living  1     SAB      IAB      Ectopic      Multiple  0   Live Births  1            Home Medications    Prior to Admission medications   Medication Sig Start Date End Date Taking? Authorizing Provider  Multiple Vitamin (MULTIVITAMIN WITH MINERALS) TABS tablet Take 1 tablet by mouth daily.    [provider]  oxyCODONE-acetaminophen (PERCOCET) 5-325 MG tablet Take 1 tablet by mouth every 4 (four) hours as needed for severe pain. 03/26/20   Edrick Kins, DPM  terbinafine (LAMISIL) 250 MG tablet Take 1 tablet (250 mg total) by mouth daily.  04/01/20   Edrick Kins, DPM  albuterol (VENTOLIN HFA) 108 (90 Base) MCG/ACT inhaler Inhale 1-2 puffs into the lungs every 6 (six) hours as needed for wheezing or shortness of breath. Patient not taking: Reported on 04/14/2020 10/04/19 04/27/20  [name], FNP    Family History Family History  Problem Relation Age of Onset   Hypertension Mother    Healthy Father     Social History Social History   Tobacco Use   Smoking status: Never Smoker   Smokeless tobacco: Never Used  Scientific laboratory technician Use: Never used  Substance Use Topics   Alcohol use: Yes    Comment: socially   Drug use: No     Allergies   Amoxicillin and Penicillins   Review of Systems Review of Systems  Constitutional: Negative for chills and fever.  Genitourinary: Positive for vaginal discharge. Negative for difficulty urinating, dysuria, frequency, menstrual problem, pelvic pain and vaginal pain.  Musculoskeletal: Negative for gait problem and myalgias.  Skin: Negative for rash.     Physical Exam Triage Vital Signs ED Triage Vitals  Enc Vitals Group     BP 04/27/20 1884  140/88     Pulse Rate 04/27/20 0821 65     Resp 04/27/20 0821 17     Temp 04/27/20 0821 97.9 F (36.6 C)     Temp Source 04/27/20 0821 Oral     SpO2 04/27/20 0821 97 %     Weight --      Height --      Head Circumference --      Peak Flow --      Pain Score 04/27/20 0819 0     Pain Loc --      Pain Edu? --      Excl. in [place]? --    No data found.  Updated Vital Signs BP 140/88 (BP Location: Right Arm)    Pulse 65    Temp 97.9 F (36.6 C) (Oral)    Resp 17    LMP 04/15/2020    SpO2 97%   Visual Acuity Right Eye Distance:   Left Eye Distance:   Bilateral Distance:    Right Eye Near:   Left Eye Near:    Bilateral Near:     Physical Exam Vitals and nursing note reviewed.  Constitutional:      General: She is not in acute distress.    Appearance: She is obese. She is not toxic-appearing.  HENT:      Head: Normocephalic.     Right Ear: External ear normal.     Left Ear: External ear normal.  Eyes:     General: No scleral icterus.    Conjunctiva/sclera: Conjunctivae normal.  Pulmonary:     Effort: Pulmonary effort is normal.  Genitourinary:    General: Normal vulva.     Comments: Inner lower labia minora borders at the introital region has fine papules and circular mild erythema on the R of about 3x3 mm, which is flat but not tender. I obtained a herpes swab from this area. This is the area that feels irritated to her.  Musculoskeletal:     Cervical back: Neck supple.  Skin:    General: Skin is warm and dry.  Neurological:     Mental Status: She is alert and oriented to person, place, and time.     Gait: Gait normal.  Psychiatric:        Mood and Affect: Mood normal.        Behavior: Behavior normal.        Thought Content: Thought content normal.        Judgment: Judgment normal.      UC Treatments / Results  Labs (all labs ordered are listed, but only abnormal results are displayed) Labs Reviewed  POCT URINALYSIS DIPSTICK, ED / UC - Abnormal; Notable for the following components:      Result Value   Leukocytes,Ua SMALL (*)    All other components within normal limits  HSV CULTURE AND TYPING  CERVICOVAGINAL ANCILLARY ONLY    EKG   Radiology No results found.  Procedures Procedures (including critical care time)  Medications Ordered in UC Medications - No data to display  Initial Impression / Assessment and Plan / UC Course  I have reviewed the triage vital signs and the nursing notes. Has recurrent and unresolved vaginal irritation.  Her STD tests are pending and she will be informed if they are positive for treatment option.  Pertinent labs  results that were available during my care of the patient were reviewed by me and considered in my medical decision making (see chart for details).  Final Clinical Impressions(s) / UC Diagnoses   Final diagnoses:   Bacterial vaginosis  Vaginal yeast infection     Discharge Instructions     Check you mychart for results. I will call you when I see them back if it comes back today.     ED Prescriptions    None     PDMP not reviewed this encounter.   Shelby Mattocks, [place] 04/27/20 1123

## 2020-04-27 NOTE — Discharge Instructions (Addendum)
Check you mychart for results. I will call you when I see them back if it comes back today.

## 2020-04-27 NOTE — ED Triage Notes (Addendum)
Pt presents with heavy discharge and irritation. States was seen at [facility] on 11/30 and a reoccuring problem. Has appt with GYN at the end of this month.

## 2020-04-28 ENCOUNTER — Telehealth (HOSPITAL_COMMUNITY): Payer: Self-pay | Admitting: Emergency Medicine

## 2020-04-28 LAB — CERVICOVAGINAL ANCILLARY ONLY
Bacterial Vaginitis (gardnerella): NEGATIVE
Candida Glabrata: NEGATIVE
Candida Vaginitis: POSITIVE — AB
Chlamydia: NEGATIVE
Comment: NEGATIVE
Comment: NEGATIVE
Comment: NEGATIVE
Comment: NEGATIVE
Comment: NEGATIVE
Comment: NORMAL
Neisseria Gonorrhea: NEGATIVE
Trichomonas: NEGATIVE

## 2020-04-28 MED ORDER — FLUCONAZOLE 150 MG PO TABS: 150.0000 mg | ORAL_TABLET | Freq: Once | ORAL | 0 refills | Status: AC

## 2020-04-29 LAB — HSV CULTURE AND TYPING

## 2020-05-02 IMAGING — US US TRANSVAGINAL NON-OB
1 series · 15 of 25 positions shown · non-contrast
Comparison: 10/02/2018

CLINICAL DATA: Pelvic pain in a female; LMP = 03/14/2019

EXAM:
ULTRASOUND PELVIS TRANSVAGINAL
TECHNIQUE: Transvaginal ultrasound examination of the pelvis was performed
including evaluation of the uterus, ovaries, adnexal regions, and
pelvic cul-de-sac.

[Series 1: us transvaginal non-ob · 15 of 33 slices shown]
[im 1/33]
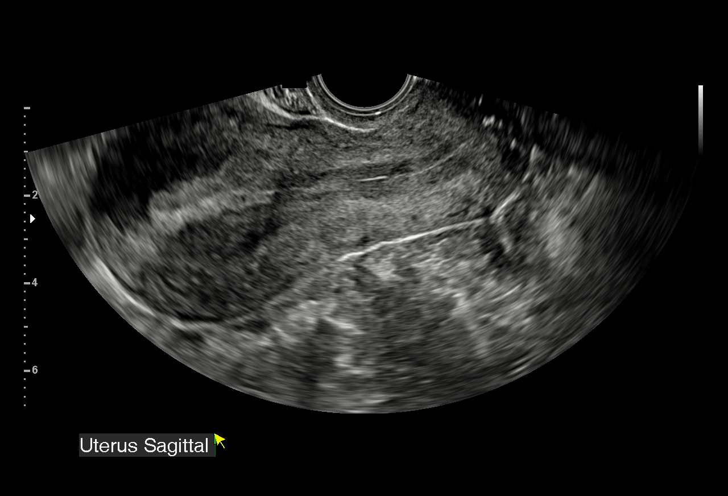
[im 3/33]
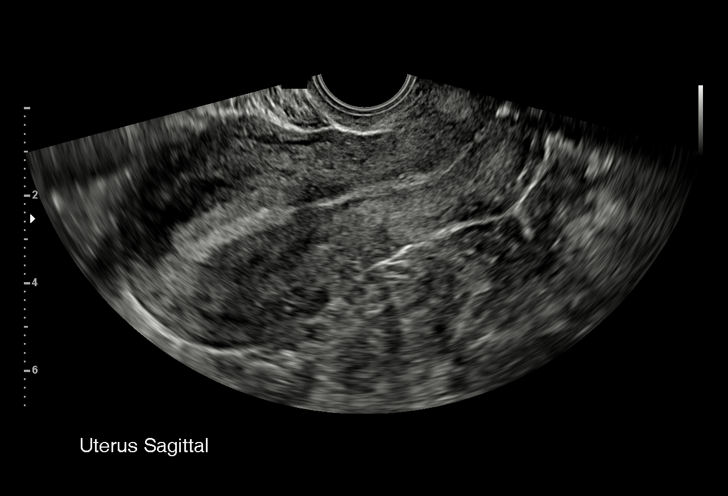
[im 6/33]
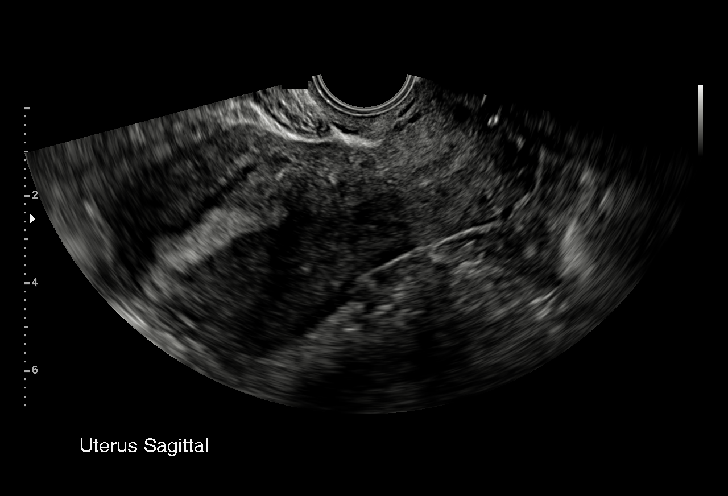
[im 7/33]
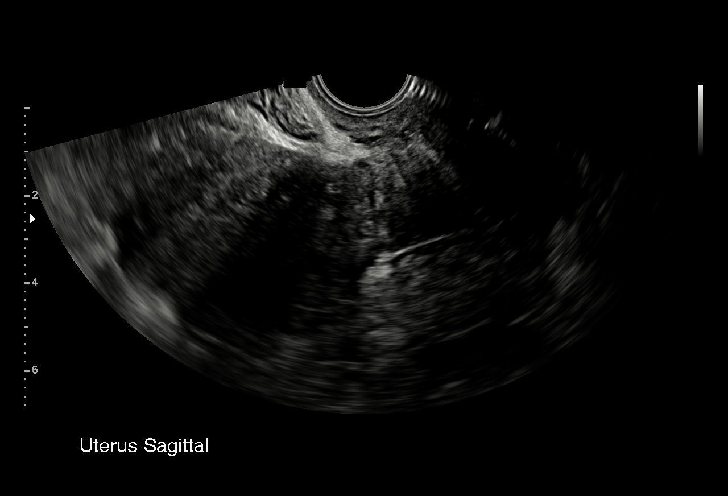
[im 10/33]
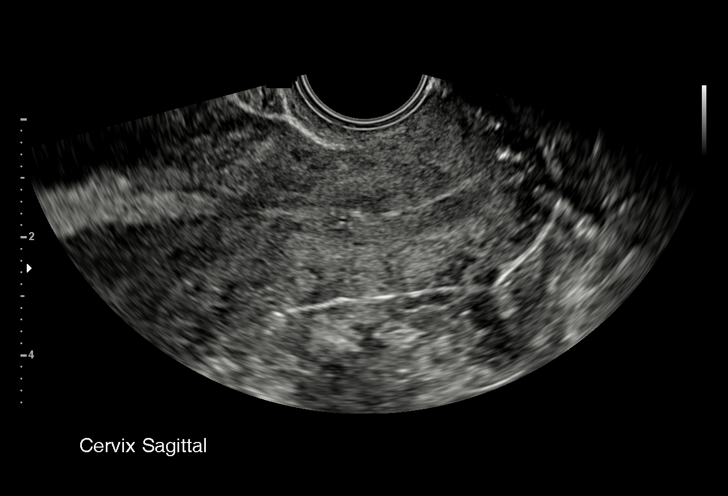
[im 13/33]
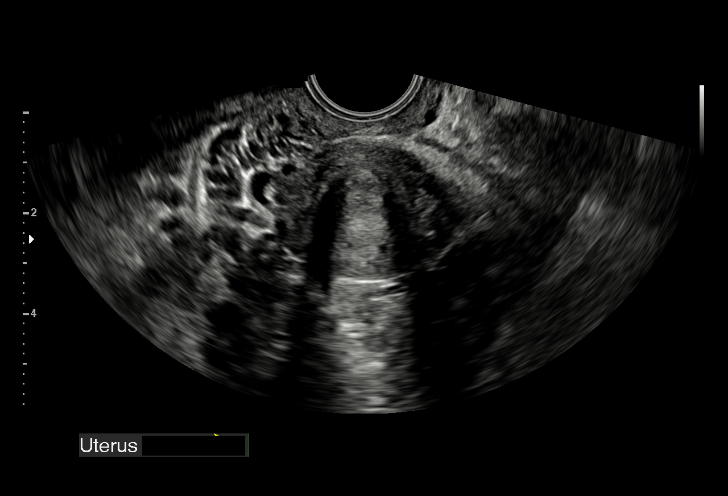
[im 14/33]
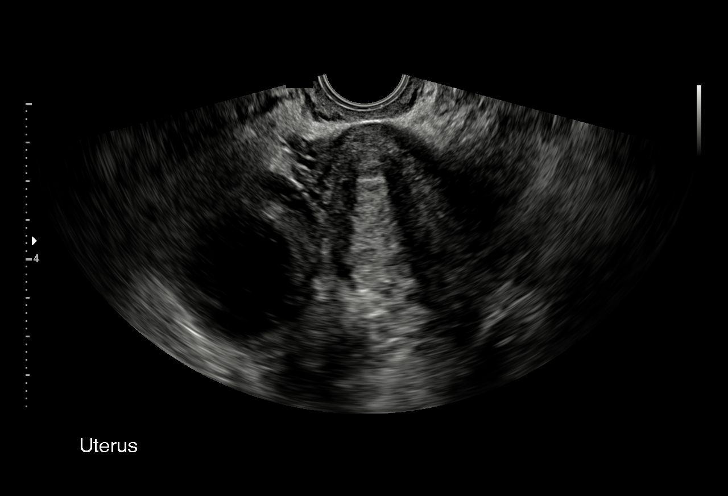
[im 17/33]
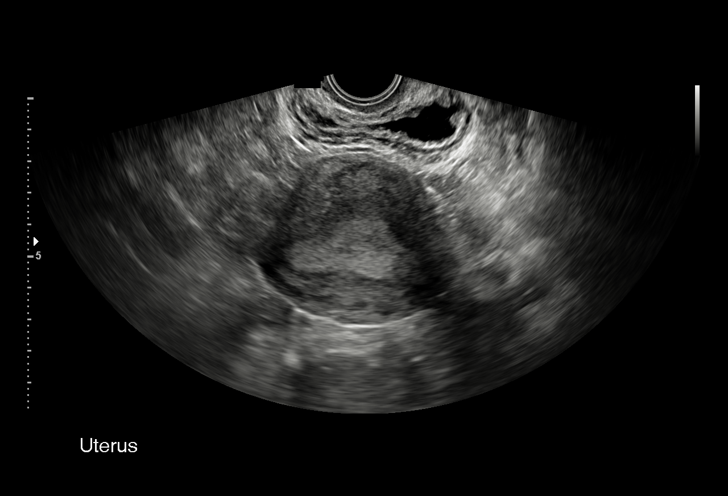
[im 19/33]
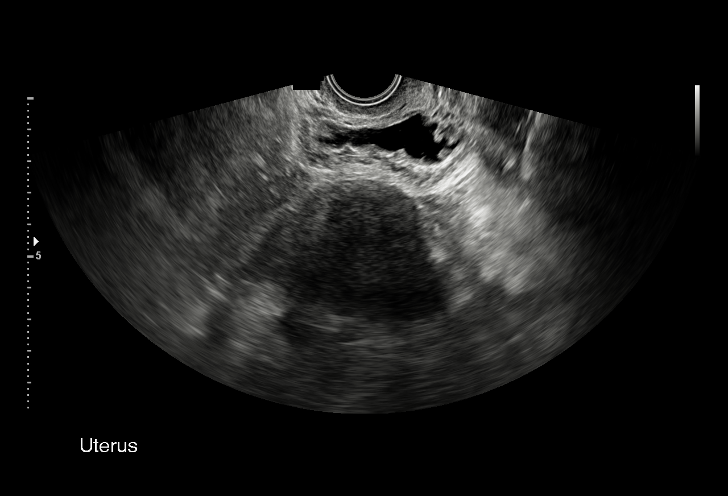
[im 21/33]
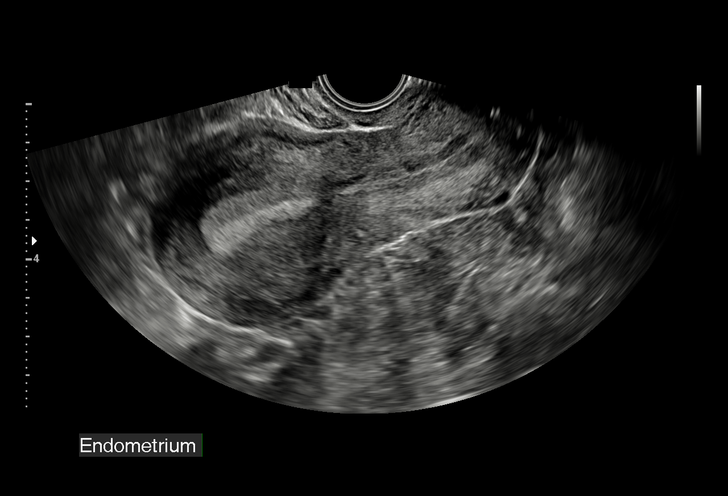
[im 23/33]
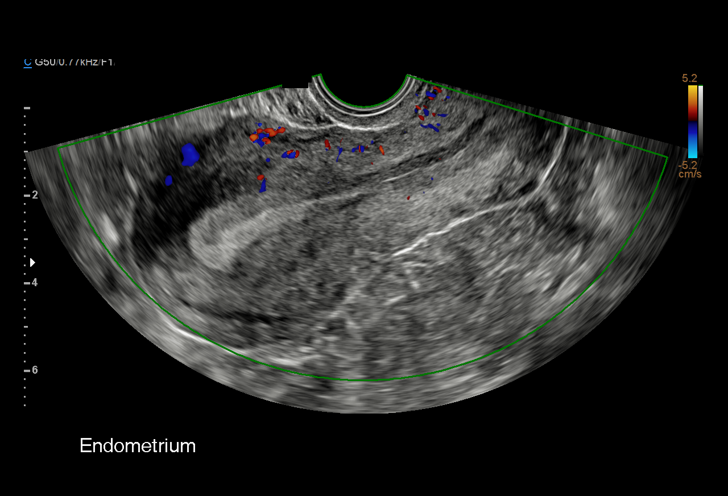
[im 26/33]
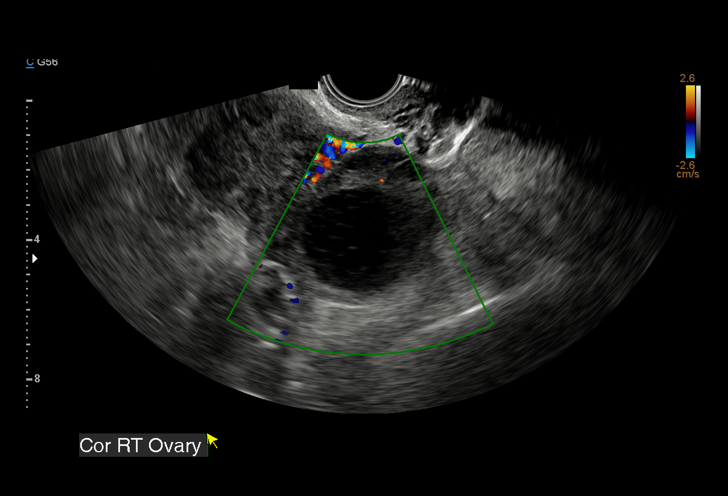
[im 27/33]
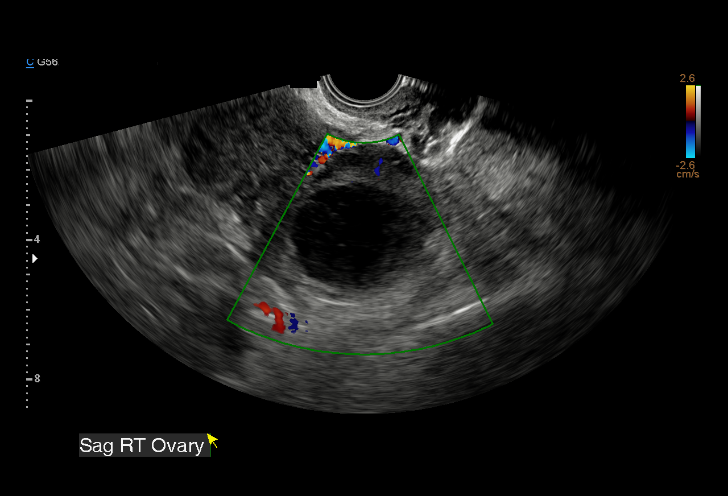
[im 30/33]
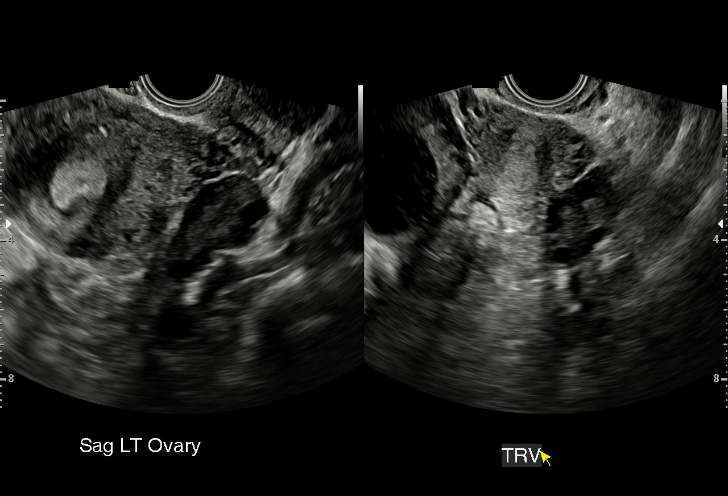
[im 33/33]
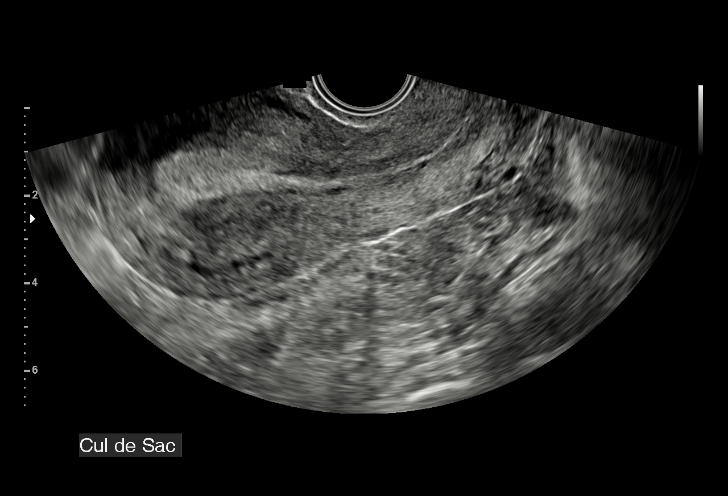

[15 of 25 positions shown; findings below may reference images not displayed]

FINDINGS: Uterus

Measurements: 10.2 x 5.2 x 6.3 cm = volume: 174 mL. Anteverted.
Normal morphology without mass

Endometrium

Thickness: 11 mm. Normal appearance without endometrial fluid or
focal abnormality.

Right ovary

Measurements: 5.3 x 3.8 x 4.1 cm = volume: 43.4 mL. Complicated cyst
of the RIGHT ovary containing low level internal echoes, measuring
4.0 x 3.3 x 3.3 cm, favor hemorrhagic cyst.

Left ovary

Measurements: 3.8 x 2.1 x 2.1 cm = volume: 8.8 mL. Normal morphology
without mass

Other findings: No free pelvic fluid. No additional adnexal masses.
IMPRESSION: Normal appearing uterus, endometrial complex and LEFT ovary.

Complicated cyst of the RIGHT ovary 4.0 cm in size containing
scattered internal echogenicity favor hemorrhagic cyst;
short-interval follow up ultrasound in 6-12 weeks is recommended,
preferably during the week following the patient's normal menses.

## 2020-05-11 ENCOUNTER — Ambulatory Visit (INDEPENDENT_AMBULATORY_CARE_PROVIDER_SITE_OTHER): Payer: Medicaid Other | Admitting: Podiatry

## 2020-05-11 ENCOUNTER — Other Ambulatory Visit: Payer: Self-pay

## 2020-05-11 ENCOUNTER — Ambulatory Visit (INDEPENDENT_AMBULATORY_CARE_PROVIDER_SITE_OTHER): Payer: Medicaid Other

## 2020-05-11 DIAGNOSIS — Z9889 Other specified postprocedural states: Secondary | ICD-10-CM

## 2020-05-11 DIAGNOSIS — L853 Xerosis cutis: Secondary | ICD-10-CM | POA: Diagnosis not present

## 2020-05-11 MED ORDER — AMMONIUM LACTATE 12 % EX LOTN: 1.0000 "application " | TOPICAL_LOTION | CUTANEOUS | 0 refills | Status: DC | PRN

## 2020-05-12 ENCOUNTER — Encounter: Payer: Self-pay | Admitting: Podiatry

## 2020-05-12 NOTE — Progress Notes (Signed)
   Subjective:  Patient presents today status post fifth toe arthroplasty bilateral/middle phalangectomy. DOS: 03/26/2020.  Patient states that she is doing well.  No new complaints at this time Past Medical History:  Diagnosis Date  . Bacterial vaginosis   . Chlamydia   . Gonorrhea   . Obesity   . UTI (lower urinary tract infection)       Objective/Physical Exam Neurovascular status intact.  Skin incisions appear to be well coapted with sutures  intact. No sign of infectious process noted. No dehiscence. No active bleeding noted. Moderate edema noted to the surgical extremity.  There is some hyperkeratotic dystrophic discolored nails noted to the bilateral great toenails consistent with findings of onychomycosis of the nail plates.  Radiographic Exam:  Absence of the middle phalanx of the bilateral fifth toes noted.  Toes appear to be in a good rectus alignment.  Assessment: 1. s/p fifth toe middle phalangectomy bilateral. DOS: 03/26/2020 2.  Onychomycosis of toenails bilateral great toes 3.  Xerosis of the heel bilateral   Plan of Care:  1. Patient was evaluated.  2.  I discussed with her that occasional numbness and tingling is very common as inside part of the body is still healing.  Patient states understanding 3.  Discontinue postsurgical shoes.  Patient may resume good supportive tennis shoes 4.  Continue Lamisil 250 mg #90 daily as prescribed.  Patient denies any liver pathology or symptoms.  Patient otherwise healthy. 5.  Prescription ammonium lactate was sent for xerosis of the heel bilaterally.  I instructed her to apply twice a day. 6.  Return to clinic in 1 week

## 2020-05-13 ENCOUNTER — Encounter: Payer: Self-pay | Admitting: Obstetrics & Gynecology

## 2020-05-13 ENCOUNTER — Other Ambulatory Visit: Payer: Self-pay

## 2020-05-13 ENCOUNTER — Ambulatory Visit (INDEPENDENT_AMBULATORY_CARE_PROVIDER_SITE_OTHER): Payer: Medicaid Other | Admitting: Obstetrics & Gynecology

## 2020-05-13 VITALS — BP 131/79 | HR 73 | Ht 65.0 in | Wt 289.0 lb

## 2020-05-13 DIAGNOSIS — B9689 Other specified bacterial agents as the cause of diseases classified elsewhere: Secondary | ICD-10-CM

## 2020-05-13 DIAGNOSIS — N760 Acute vaginitis: Secondary | ICD-10-CM | POA: Insufficient documentation

## 2020-05-13 MED ORDER — CLEOCIN 100 MG VA SUPP: 100.0000 mg | Freq: Every day | VAGINAL | 0 refills | Status: DC

## 2020-05-13 NOTE — Progress Notes (Signed)
Patient ID: Melanie Cisneros, female   DOB: 1991/02/27, 29 y.o.   MRN: [id number]  Chief Complaint  Patient presents with  . recurrent BV    HPI Melanie Cisneros is a 29 y.o. female.  G1P1001 Patient's last menstrual period was 05/11/2020 (exact date). She has been treated for both BV and vaginal yeast several times since June and I reviewed her tests and when they were positive. She was last treated for yeast and she has no symptoms today. HPI  Past Medical History:  Diagnosis Date  . Bacterial vaginosis   . Chlamydia   . Gonorrhea   . Obesity   . UTI (lower urinary tract infection)     Past Surgical History:  Procedure Laterality Date  . WISDOM TOOTH EXTRACTION      Family History  Problem Relation Age of Onset  . Hypertension Mother   . Healthy Father     Social History Social History   Tobacco Use  . Smoking status: Never Smoker  . Smokeless tobacco: Never Used  Vaping Use  . Vaping Use: Never used  Substance Use Topics  . Alcohol use: Yes    Comment: socially  . Drug use: No    Allergies  Allergen Reactions  . Amoxicillin Anaphylaxis, Hives and Other (See Comments)    Has patient had a PCN reaction causing immediate rash, facial/tongue/throat swelling, SOB or lightheadedness with hypotension: Yes Has patient had a PCN reaction causing severe rash involving mucus membranes or skin necrosis: No Has patient had a PCN reaction that required hospitalization No Has patient had a PCN reaction occurring within the last 10 years: No If all of the above answers are "NO", then may proceed with Cephalosporin use.  Marland Kitchen Penicillins Anaphylaxis, Hives and Other (See Comments)    Has patient had a PCN reaction causing immediate rash, facial/tongue/throat swelling, SOB or lightheadedness with hypotension: Yes Has patient had a PCN reaction causing severe rash involving mucus membranes or skin necrosis: No Has patient had a PCN reaction that required hospitalization No Has  patient had a PCN reaction occurring within the last 10 years: No If all of the above answers are "NO", then may proceed with Cephalosporin use.    Current Outpatient Medications  Medication Sig Dispense Refill  . clindamycin (CLEOCIN) 100 MG vaginal suppository Place 1 suppository (100 mg total) vaginally at bedtime. 3 suppository 0  . ibuprofen (ADVIL) 800 MG tablet Take 800 mg by mouth every 8 (eight) hours as needed.    . Multiple Vitamin (MULTIVITAMIN WITH MINERALS) TABS tablet Take 1 tablet by mouth daily.    Marland Kitchen ammonium lactate (AMLACTIN) 12 % lotion Apply 1 application topically as needed for dry skin. (Patient not taking: Reported on 05/13/2020) 400 g 0  . terbinafine (LAMISIL) 250 MG tablet Take 1 tablet (250 mg total) by mouth daily. (Patient not taking: Reported on 05/13/2020) 90 tablet 0   No current facility-administered medications for this visit.    Review of Systems Review of Systems  Constitutional: Negative.   Respiratory: Negative.   Gastrointestinal: Negative.   Genitourinary: Negative for pelvic pain, vaginal bleeding and vaginal discharge.    Blood pressure 131/79, pulse 73, height 5\' 5"  (1.651 m), weight 289 lb (131.1 kg), last menstrual period 05/11/2020.  Physical Exam Physical Exam Constitutional:      Appearance: She is obese. She is not ill-appearing.  Cardiovascular:     Rate and Rhythm: Normal rate.  Pulmonary:     Effort: Pulmonary effort  is normal.  Genitourinary:    Comments: Deferred as she has no current symptoms Neurological:     Mental Status: She is alert.     Data Reviewed Results for LEVITA, MONICAL (MRN [id number]) as of 05/13/2020 15:25  Ref. Range 04/27/2020 08:49  Chlamydia Unknown Negative  Neisseria Gonorrhea Unknown Negative  Trichomonas Unknown Negative  Candida Vaginitis Unknown Positive (A)  Candida Glabrata Unknown Negative  Bacterial Vaginitis (gardnerella) Unknown Negative    Assessment Recurrent  BV  Plan Current Outpatient Medications on File Prior to Visit  Medication Sig Dispense Refill  . ibuprofen (ADVIL) 800 MG tablet Take 800 mg by mouth every 8 (eight) hours as needed.    . Multiple Vitamin (MULTIVITAMIN WITH MINERALS) TABS tablet Take 1 tablet by mouth daily.    Marland Kitchen ammonium lactate (AMLACTIN) 12 % lotion Apply 1 application topically as needed for dry skin. (Patient not taking: Reported on 05/13/2020) 400 g 0  . terbinafine (LAMISIL) 250 MG tablet Take 1 tablet (250 mg total) by mouth daily. (Patient not taking: Reported on 05/13/2020) 90 tablet 0  . [DISCONTINUED] albuterol (VENTOLIN HFA) 108 (90 Base) MCG/ACT inhaler Inhale 1-2 puffs into the lungs every 6 (six) hours as needed for wheezing or shortness of breath. (Patient not taking: Reported on 04/14/2020) 18 g 0   No current facility-administered medications on file prior to visit.   Meds ordered this encounter  Medications  . clindamycin (CLEOCIN) 100 MG vaginal suppository    Sig: Place 1 suppository (100 mg total) vaginally at bedtime.    Dispense:  3 suppository    Refill:  0       Scheryl Darter 05/13/2020, 3:21 PM

## 2020-05-13 NOTE — Patient Instructions (Signed)

## 2020-06-10 ENCOUNTER — Other Ambulatory Visit: Payer: Self-pay

## 2020-06-10 ENCOUNTER — Ambulatory Visit (INDEPENDENT_AMBULATORY_CARE_PROVIDER_SITE_OTHER): Payer: Medicaid Other | Admitting: Podiatry

## 2020-06-10 DIAGNOSIS — Z9889 Other specified postprocedural states: Secondary | ICD-10-CM

## 2020-06-10 NOTE — Progress Notes (Signed)
   Subjective:  Patient presents today status post fifth toe arthroplasty bilateral/middle phalangectomy. DOS: 03/26/2020.  Patient states that she is doing well.  No new complaints at this time  Past Medical History:  Diagnosis Date  . Bacterial vaginosis   . Chlamydia   . Gonorrhea   . Obesity   . UTI (lower urinary tract infection)       Objective/Physical Exam Neurovascular status intact.  Skin incisions appear to be well coapted with sutures  intact. No sign of infectious process noted. No dehiscence. No active bleeding noted. Moderate edema noted to the surgical extremity.  There is some hyperkeratotic dystrophic discolored nails noted to the bilateral great toenails consistent with findings of onychomycosis of the nail plates, however the base of the nails appears significantly improved and it appears that the onychomycosis is growing out  Assessment: 1. s/p fifth toe middle phalangectomy bilateral. DOS: 03/26/2020 2.  Onychomycosis of toenails bilateral great toes-improving   Plan of Care:  1. Patient was evaluated.  2.  Patient may resume full activity no restrictions.  Recommend good supportive shoes 3.  Continue oral Lamisil until completed 4.  Return to clinic as needed  Edrick Kins, DPM [facility]  Dr. Edrick Kins, DPM    [address], [place] [address]                Office [phone]  Fax [phone]

## 2020-07-02 ENCOUNTER — Other Ambulatory Visit: Payer: Self-pay | Admitting: Obstetrics & Gynecology

## 2020-07-02 DIAGNOSIS — B9689 Other specified bacterial agents as the cause of diseases classified elsewhere: Secondary | ICD-10-CM

## 2020-07-13 ENCOUNTER — Other Ambulatory Visit: Payer: Self-pay | Admitting: Lactation Services

## 2020-07-13 MED ORDER — CLINDAMYCIN PHOSPHATE 100 MG VA SUPP: 100.0000 mg | Freq: Every day | VAGINAL | 0 refills | Status: DC

## 2020-07-17 ENCOUNTER — Encounter: Payer: Self-pay | Admitting: Podiatry

## 2020-07-17 NOTE — Telephone Encounter (Signed)
Spoke with this patient. Our office said I couldn't see her until March 23rd (next available appt). I'm not sure who is saying this from the office, but I'm not that booked.  Estill Bamberg, can you please contact this patient and set up an appt for her March 9th afternoon? Thanks!

## 2020-07-22 ENCOUNTER — Ambulatory Visit: Payer: Medicaid Other | Admitting: Podiatry

## 2020-07-30 ENCOUNTER — Ambulatory Visit (INDEPENDENT_AMBULATORY_CARE_PROVIDER_SITE_OTHER): Payer: Medicaid Other

## 2020-07-30 ENCOUNTER — Other Ambulatory Visit: Payer: Self-pay

## 2020-07-30 ENCOUNTER — Encounter: Payer: Self-pay | Admitting: Podiatry

## 2020-07-30 ENCOUNTER — Ambulatory Visit: Payer: Medicaid Other | Admitting: Podiatry

## 2020-07-30 ENCOUNTER — Ambulatory Visit: Payer: Medicaid Other

## 2020-07-30 DIAGNOSIS — M79672 Pain in left foot: Secondary | ICD-10-CM

## 2020-07-30 DIAGNOSIS — M722 Plantar fascial fibromatosis: Secondary | ICD-10-CM

## 2020-07-30 DIAGNOSIS — M7661 Achilles tendinitis, right leg: Secondary | ICD-10-CM | POA: Diagnosis not present

## 2020-07-30 MED ORDER — TRIAMCINOLONE ACETONIDE 10 MG/ML IJ SUSP
10.0000 mg | Freq: Once | INTRAMUSCULAR | Status: AC
  Administered 2020-07-30: 10 mg

## 2020-08-02 NOTE — Progress Notes (Signed)
Subjective:   Patient ID: Melanie Cisneros, female   DOB: 30 y.o.   MRN: [id number]   HPI Patient presents stating that she is having pain in the bottom of her left heel and is concerned about the back of her heel stating that she occasionally gets discomfort.  She does have significant obesity which is complicating factor to her condition and states the heel has been recent   ROS      Objective:  Physical Exam  Neurovascular status intact with patient who has moderate to extreme obesity with exquisite discomfort plantar aspect heel at the insertional point tendon calcaneus with fluid buildup with mild to moderate discomfort in the posterior heel right     Assessment:  Acute plantar fasciitis left Achilles tendinitis right with obesity complicating factor     Plan:  H&P reviewed condition and I discussed causes and for the left heel I did inject the fascia 3 mg dexamethasone Kenalog 5 mg Xylocaine and I then discussed Achilles recommended exercises heel lift weight loss.  Patient can also use topical and oral medications as needed  X-rays indicate small plantar spur formation and spurs in the plantar heel

## 2020-08-03 ENCOUNTER — Other Ambulatory Visit: Payer: Self-pay

## 2020-08-03 ENCOUNTER — Ambulatory Visit: Payer: Medicaid Other | Admitting: Podiatry

## 2020-08-03 ENCOUNTER — Encounter: Payer: Self-pay | Admitting: Podiatry

## 2020-08-03 DIAGNOSIS — M7662 Achilles tendinitis, left leg: Secondary | ICD-10-CM | POA: Diagnosis not present

## 2020-08-03 DIAGNOSIS — M722 Plantar fascial fibromatosis: Secondary | ICD-10-CM | POA: Diagnosis not present

## 2020-08-03 MED ORDER — METHYLPREDNISOLONE 4 MG PO TBPK: ORAL_TABLET | ORAL | 0 refills | Status: DC

## 2020-08-03 NOTE — Progress Notes (Signed)
   Subjective: 30 y.o. female PMHx morbid obesity presenting for follow-up evaluation of left foot pain.  Patient states that she received a steroid shot last week by another physician here in the office and she was not satisfied with her treatment.  She continues to have some heel pain that has been going on for several weeks now.  Patient has pain and tenderness with weightbearing.  She denies a history of injury.  She presents for further treatment evaluation   Past Medical History:  Diagnosis Date  . Bacterial vaginosis   . Chlamydia   . Gonorrhea   . Obesity   . UTI (lower urinary tract infection)      Objective: Physical Exam General: The patient is alert and oriented x3 in no acute distress.  Dermatology: Skin is warm, dry and supple bilateral lower extremities. Negative for open lesions or macerations bilateral.   Vascular: Dorsalis Pedis and Posterior Tibial pulses palpable bilateral.  Capillary fill time is immediate to all digits.  Neurological: Epicritic and protective threshold intact bilateral.   Musculoskeletal: Tenderness to palpation to the plantar aspect of the left heel along the plantar fascia.  There is also some tenderness to palpation along the Achilles tendon left lower extremity all other joints range of motion within normal limits bilateral. Strength 5/5 in all groups bilateral.   Assessment: 1. Plantar fasciitis left foot 2.  Achilles tendinitis left 2.  Morbid obesity  Plan of Care:  1. Patient evaluated. Xrays reviewed that were taken last visit 2.  Cam boot dispensed.  Weightbearing as tolerated 3. Rx for Medrol Dose Pak placed 4. Rx for Meloxicam ordered for patient, then continue Motrin 800 mg. 5.  Note for work was provided today. 6.  Patient weight likely contributes to the patient's foot pain.  I do believe there is a direct relationship between the two since she is on her feet and she has increased weight causing excessive foot pressure and  pain. 7.  Return to clinic as needed  Edrick Kins, DPM [facility]  Dr. Edrick Kins, DPM    [address], [place] [address]                Office [phone]  Fax [phone]

## 2020-08-05 ENCOUNTER — Ambulatory Visit: Payer: Medicaid Other | Admitting: Podiatry

## 2020-08-13 ENCOUNTER — Ambulatory Visit: Payer: Medicaid Other

## 2020-08-14 ENCOUNTER — Other Ambulatory Visit: Payer: Self-pay

## 2020-08-14 ENCOUNTER — Ambulatory Visit: Payer: Medicaid Other

## 2020-08-17 ENCOUNTER — Other Ambulatory Visit (HOSPITAL_COMMUNITY)
Admission: RE | Admit: 2020-08-17 | Discharge: 2020-08-17 | Disposition: A | Discharge status: Home / Self Care | Payer: Medicaid Other | Source: Ambulatory Visit | Attending: Family Medicine | Admitting: Family Medicine

## 2020-08-17 ENCOUNTER — Other Ambulatory Visit: Payer: Self-pay

## 2020-08-17 ENCOUNTER — Ambulatory Visit: Payer: Medicaid Other | Admitting: *Deleted

## 2020-08-17 VITALS — BP 121/87 | HR 102 | Ht 64.0 in | Wt 294.4 lb

## 2020-08-17 DIAGNOSIS — Z202 Contact with and (suspected) exposure to infections with a predominantly sexual mode of transmission: Secondary | ICD-10-CM | POA: Insufficient documentation

## 2020-08-17 DIAGNOSIS — N898 Other specified noninflammatory disorders of vagina: Secondary | ICD-10-CM

## 2020-08-17 NOTE — Progress Notes (Signed)
Here for nurse visit for c/o vaginal irritation and urination often. Would like to do self swab to check for BV and yeast , gc and trich. Would like to do urinalysis to check for uti. Jacques Navy

## 2020-08-17 NOTE — Progress Notes (Signed)
Patient seen and assessed by nursing staff.  Agree with documentation and plan.  

## 2020-08-18 ENCOUNTER — Other Ambulatory Visit: Payer: Self-pay

## 2020-08-18 LAB — CERVICOVAGINAL ANCILLARY ONLY
Bacterial Vaginitis (gardnerella): POSITIVE — AB
Candida Glabrata: NEGATIVE
Candida Vaginitis: NEGATIVE
Chlamydia: NEGATIVE
Comment: NEGATIVE
Comment: NEGATIVE
Comment: NEGATIVE
Comment: NEGATIVE
Comment: NEGATIVE
Comment: NORMAL
Neisseria Gonorrhea: NEGATIVE
Trichomonas: NEGATIVE

## 2020-08-19 ENCOUNTER — Other Ambulatory Visit: Payer: Self-pay

## 2020-08-19 MED ORDER — CLINDAMYCIN PHOSPHATE 100 MG VA SUPP: 100.0000 mg | Freq: Every day | VAGINAL | 0 refills | Status: DC

## 2020-08-19 MED ORDER — METRONIDAZOLE 500 MG PO TABS: 500.0000 mg | ORAL_TABLET | Freq: Two times a day (BID) | ORAL | 0 refills | Status: AC

## 2020-08-19 NOTE — Addendum Note (Signed)
Addended by: Donnamae Jude on: 08/19/2020 09:40 AM   Modules accepted: Orders

## 2020-08-26 MED ORDER — CLINDAMYCIN PHOSPHATE 100 MG VA SUPP: 100.0000 mg | Freq: Every day | VAGINAL | 0 refills | Status: DC

## 2020-10-12 ENCOUNTER — Other Ambulatory Visit: Payer: Self-pay

## 2020-10-12 ENCOUNTER — Encounter (HOSPITAL_COMMUNITY): Payer: Self-pay

## 2020-10-12 ENCOUNTER — Ambulatory Visit (HOSPITAL_COMMUNITY)
Admission: EM | Admit: 2020-10-12 | Discharge: 2020-10-12 | Disposition: A | Discharge status: Home / Self Care | Payer: Medicaid Other | Attending: Physician Assistant | Admitting: Physician Assistant

## 2020-10-12 DIAGNOSIS — J029 Acute pharyngitis, unspecified: Secondary | ICD-10-CM | POA: Diagnosis not present

## 2020-10-12 DIAGNOSIS — R509 Fever, unspecified: Secondary | ICD-10-CM | POA: Insufficient documentation

## 2020-10-12 DIAGNOSIS — J039 Acute tonsillitis, unspecified: Secondary | ICD-10-CM | POA: Diagnosis not present

## 2020-10-12 DIAGNOSIS — R5383 Other fatigue: Secondary | ICD-10-CM | POA: Diagnosis not present

## 2020-10-12 LAB — POCT INFECTIOUS MONO SCREEN, ED / UC: Mono Screen: NEGATIVE

## 2020-10-12 LAB — POCT RAPID STREP A, ED / UC: Streptococcus, Group A Screen (Direct): NEGATIVE

## 2020-10-12 MED ORDER — CLINDAMYCIN HCL 300 MG PO CAPS: 300.0000 mg | ORAL_CAPSULE | Freq: Three times a day (TID) | ORAL | 0 refills | Status: DC

## 2020-10-12 NOTE — Discharge Instructions (Signed)
Your strep and mono testing was negative.  I am concerned for infection of your tonsils and so we are starting you on antibiotics.  Please take clindamycin 3 times a day.  This can upset your stomach so please take it with food.  If you have any severe diarrhea stop the medication and be seen immediately.  Please continue alternating Tylenol and ibuprofen to manage fever and pain.  If you have any trouble swallowing or breathing please go to the ER immediately.  Follow-up with PCP or our clinic within 1 week.

## 2020-10-12 NOTE — ED Triage Notes (Signed)
Pt report chills, fever 102.0 F, sore throat and fatigue x 2 days. Reports she had 2 negative COVID test at [facility] and her job. Pt took ibuprofen 1-11/2 hr ago.

## 2020-10-12 NOTE — ED Provider Notes (Signed)
[place]    CSN: [id number] Arrival date & time: 10/12/20  [place]      History   Chief Complaint Chief Complaint  Patient presents with  . Fatigue  . Fever  . Sore Throat    HPI Melanie Cisneros is a 30 y.o. female.   Patient presents today with a 3-day history of sore throat.  Reports sore throat pain has gradually improved and is currently rated 4 on a 0-10 pain scale, described as aching with periodic sharp pains, worse with swallowing, no alleviating factors identified.  She reports associated, headache, fever and fatigue.  Denies cough, congestion, body aches.  She has tried ibuprofen 800 mg without improvement of symptoms.  Denies any known sick contacts.  Reports she is up-to-date on immunizations including COVID-19 vaccination.  She denies any recent antibiotic use.  She denies any difficulty speaking or swallowing.  Denies any shortness of breath.  She has taken several COVID 19 tests both at home and at her place of employment that have been negative.     Past Medical History:  Diagnosis Date  . Bacterial vaginosis   . Chlamydia   . Gonorrhea   . Obesity   . UTI (lower urinary tract infection)     Patient Active Problem List   Diagnosis Date Noted  . Bacterial vaginosis 05/13/2020  . BMI 45.0-49.9, adult ([place]) 06/06/2016  . Psychosocial stressors 02/25/2016  . 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 Rubella non-immune status, antepartum 01/27/2016    Past Surgical History:  Procedure Laterality Date  . WISDOM TOOTH EXTRACTION      OB History    Gravida  1   Para  1   Term  1   Preterm      AB      Living  1     SAB      IAB      Ectopic      Multiple  0   Live Births  1            Home Medications    Prior to Admission medications   Medication Sig Start Date End Date Taking? Authorizing Provider  clindamycin (CLEOCIN) 300 MG capsule Take 1 capsule (300 mg total) by mouth 3 (three) times daily. 10/12/20  Yes Raynell Upton K, PA-C   ibuprofen (ADVIL) 800 MG tablet Take 800 mg by mouth every 8 (eight) hours as needed.    [provider]  terbinafine (LAMISIL) 250 MG tablet Take 1 tablet (250 mg total) by mouth daily. Patient not taking: Reported on 05/13/2020 04/01/20   Edrick Kins, DPM  albuterol (VENTOLIN HFA) 108 (90 Base) MCG/ACT inhaler Inhale 1-2 puffs into the lungs every 6 (six) hours as needed for wheezing or shortness of breath. Patient not taking: Reported on 04/14/2020 10/04/19 04/27/20  [name], FNP    Family History Family History  Problem Relation Age of Onset  . Hypertension Mother   . Healthy Father     Social History Social History   Tobacco Use  . Smoking status: Never Smoker  . Smokeless tobacco: Never Used  Vaping Use  . Vaping Use: Never used  Substance Use Topics  . Alcohol use: Yes    Comment: socially  . Drug use: No     Allergies   Amoxicillin and Penicillins   Review of Systems Review of Systems  Constitutional: Positive for activity change, fatigue and fever. Negative for appetite change.  HENT: Positive for sore throat.  Negative for congestion, sinus pressure and sneezing.   Respiratory: Negative for cough and shortness of breath.   Cardiovascular: Negative for chest pain.  Gastrointestinal: Positive for nausea. Negative for abdominal pain, diarrhea and vomiting.  Musculoskeletal: Negative for arthralgias and myalgias.  Neurological: Positive for headaches. Negative for dizziness and light-headedness.     Physical Exam Triage Vital Signs ED Triage Vitals  Enc Vitals Group     BP 10/12/20 1853 125/73     Pulse Rate 10/12/20 1853 95     Resp 10/12/20 1853 19     Temp 10/12/20 1853 (!) 100.5 F (38.1 C)     Temp Source 10/12/20 1853 Oral     SpO2 10/12/20 1853 97 %     Weight --      Height --      Head Circumference --      Peak Flow --      Pain Score 10/12/20 1849 8     Pain Loc --      Pain Edu? --      Excl. in [place]? --    No  data found.  Updated Vital Signs BP 125/73 (BP Location: Right Arm)   Pulse 95   Temp (!) 100.5 F (38.1 C) (Oral)   Resp 19   LMP  (Within Days)   SpO2 97%   Visual Acuity Right Eye Distance:   Left Eye Distance:   Bilateral Distance:    Right Eye Near:   Left Eye Near:    Bilateral Near:     Physical Exam Vitals reviewed.  Constitutional:      General: She is awake. She is not in acute distress.    Appearance: Normal appearance. She is overweight. She is not ill-appearing.     Comments: Very pleasant female appears stated age in no acute distress  HENT:     Head: Normocephalic and atraumatic.     Right Ear: Tympanic membrane, ear canal and external ear normal. Tympanic membrane is not erythematous or bulging.     Left Ear: Tympanic membrane, ear canal and external ear normal. Tympanic membrane is not erythematous or bulging.     Nose:     Right Sinus: No maxillary sinus tenderness or frontal sinus tenderness.     Left Sinus: No maxillary sinus tenderness or frontal sinus tenderness.     Mouth/Throat:     Pharynx: Uvula midline. Posterior oropharyngeal erythema present. No oropharyngeal exudate.     Tonsils: Tonsillar exudate present. No tonsillar abscesses. 2+ on the right. 2+ on the left.     Comments: Erythema and exudate noted bilateral tonsils.  No evidence of abscess. Cardiovascular:     Rate and Rhythm: Normal rate and regular rhythm.     Heart sounds: Normal heart sounds. No murmur heard.   Pulmonary:     Effort: Pulmonary effort is normal.     Breath sounds: Normal breath sounds. No wheezing, rhonchi or rales.     Comments: Clear to auscultation bilaterally Musculoskeletal:     Right lower leg: No edema.     Left lower leg: No edema.  Lymphadenopathy:     Head:     Right side of head: No submental, submandibular or tonsillar adenopathy.     Left side of head: No submental, submandibular or tonsillar adenopathy.     Cervical: No cervical adenopathy.   Psychiatric:        Behavior: Behavior is cooperative.      UC Treatments / Results  Labs (all labs ordered are listed, but only abnormal results are displayed) Labs Reviewed  CULTURE, GROUP A STREP [facility]  POCT RAPID STREP A, ED / UC  POCT INFECTIOUS MONO SCREEN, ED / UC    EKG   Radiology No results found.  Procedures Procedures (including critical care time)  Medications Ordered in UC Medications - No data to display  Initial Impression / Assessment and Plan / UC Course  I have reviewed the triage vital signs and the nursing notes.  Pertinent labs & imaging results that were available during my care of the patient were reviewed by me and considered in my medical decision making (see chart for details).     No indication for flu testing given she has no cough symptoms.  She has already had several negative COVID-19 test so will not repeat this today as it would not change management.  Rapid strep was negative in office today.  Mono was negative.  Patient is allergic to amoxicillin so we will treat with clindamycin.  She was instructed to alternate Tylenol and ibuprofen to help manage symptoms.  Discussed alarm symptoms that warrant emergent evaluation.  Strict return precautions given to which she expressed understanding.  Final Clinical Impressions(s) / UC Diagnoses   Final diagnoses:  Sore throat  Fever, unspecified  Fatigue, unspecified type  Acute tonsillitis, unspecified etiology     Discharge Instructions     Your strep and mono testing was negative.  I am concerned for infection of your tonsils and so we are starting you on antibiotics.  Please take clindamycin 3 times a day.  This can upset your stomach so please take it with food.  If you have any severe diarrhea stop the medication and be seen immediately.  Please continue alternating Tylenol and ibuprofen to manage fever and pain.  If you have any trouble swallowing or breathing please go to the ER  immediately.  Follow-up with PCP or our clinic within 1 week.    ED Prescriptions    Medication Sig Dispense Auth. Provider   clindamycin (CLEOCIN) 300 MG capsule Take 1 capsule (300 mg total) by mouth 3 (three) times daily. 21 capsule Quintez Maselli K, PA-C     PDMP not reviewed this encounter.   Terrilee Croak, PA-C 10/12/20 1952

## 2020-10-13 LAB — CULTURE, GROUP A STREP (THRC)

## 2020-12-21 DIAGNOSIS — Z20822 Contact with and (suspected) exposure to covid-19: Secondary | ICD-10-CM | POA: Diagnosis not present

## 2020-12-25 DIAGNOSIS — R1030 Lower abdominal pain, unspecified: Secondary | ICD-10-CM | POA: Diagnosis not present

## 2020-12-25 DIAGNOSIS — R350 Frequency of micturition: Secondary | ICD-10-CM | POA: Diagnosis not present

## 2020-12-25 DIAGNOSIS — N644 Mastodynia: Secondary | ICD-10-CM | POA: Diagnosis not present

## 2021-01-11 ENCOUNTER — Telehealth: Payer: Medicaid Other | Admitting: Physician Assistant

## 2021-01-11 DIAGNOSIS — J069 Acute upper respiratory infection, unspecified: Secondary | ICD-10-CM

## 2021-01-11 MED ORDER — BENZONATATE 100 MG PO CAPS: 100.0000 mg | ORAL_CAPSULE | Freq: Three times a day (TID) | ORAL | 0 refills | Status: DC | PRN

## 2021-01-11 NOTE — Patient Instructions (Signed)
Melanie Cisneros, thank you for joining Leeanne Rio, PA-C for today's virtual visit.  While this provider is not your primary care provider (PCP), if your PCP is located in our provider database this encounter information will be shared with them immediately following your visit.  Consent: (Patient) Melanie Cisneros provided verbal consent for this virtual visit at the beginning of the encounter.  Current Medications:  Current Outpatient Medications:    clindamycin (CLEOCIN) 300 MG capsule, Take 1 capsule (300 mg total) by mouth 3 (three) times daily., Disp: 21 capsule, Rfl: 0   ibuprofen (ADVIL) 800 MG tablet, Take 800 mg by mouth every 8 (eight) hours as needed., Disp: , Rfl:    terbinafine (LAMISIL) 250 MG tablet, Take 1 tablet (250 mg total) by mouth daily. (Patient not taking: Reported on 05/13/2020), Disp: 90 tablet, Rfl: 0   Medications ordered in this encounter:  No orders of the defined types were placed in this encounter.    *If you need refills on other medications prior to your next appointment, please contact your pharmacy*  Follow-Up: Call back or seek an in-person evaluation if the symptoms worsen or if the condition fails to improve as anticipated.  Other Instructions   Based on what you have shared with me, it looks like you may have a viral upper respiratory infection.  Upper respiratory infections are caused by a large number of viruses; however, rhinovirus is the most common cause.   Symptoms vary from person to person, with common symptoms including sore throat, cough, fatigue or lack of energy and feeling of general discomfort.  A low-grade fever of up to 100.4 may present, but is often uncommon.  Symptoms vary however, and are closely related to a person's age or underlying illnesses.  The most common symptoms associated with an upper respiratory infection are nasal discharge or congestion, cough, sneezing, headache and pressure in the ears and face.   These symptoms usually persist for about 3 to 10 days, but can last up to 2 weeks.  It is important to know that upper respiratory infections do not cause serious illness or complications in most cases.    Upper respiratory infections can be transmitted from person to person, with the most common method of transmission being a person's hands.  The virus is able to live on the skin and can infect other persons for up to 2 hours after direct contact.  Also, these can be transmitted when someone coughs or sneezes; thus, it is important to cover the mouth to reduce this risk.  To keep the spread of the illness at [place], good hand hygiene is very important.  This is an infection that is most likely caused by a virus. There are no specific treatments other than to help you with the symptoms until the infection runs its course.  We are sorry you are not feeling well.  Here is how we plan to help!   For nasal congestion, you may use an oral decongestants such as Mucinex D or if you have glaucoma or high blood pressure use plain Mucinex.  Saline nasal spray or nasal drops can help and can safely be used as often as needed for congestion.    If you do not have a history of heart disease, hypertension, diabetes or thyroid disease, prostate/bladder issues or glaucoma, you may also use Sudafed to treat nasal congestion.  It is highly recommended that you consult with a pharmacist or your primary care physician to ensure  this medication is safe for you to take.     If you have a cough, you may use cough suppressants such as Delsym and Robitussin.  If you have glaucoma or high blood pressure, you can also use Coricidin HBP.   For cough I have prescribed for you A prescription cough medication called Tessalon Perles 100 mg. You may take 1-2 capsules every 8 hours as needed for cough  If you have a sore or scratchy throat, use a saltwater gargle-  to  teaspoon of salt dissolved in a 4-ounce to 8-ounce glass of warm  water.  Gargle the solution for approximately 15-30 seconds and then spit.  It is important not to swallow the solution.  You can also use throat lozenges/cough drops and Chloraseptic spray to help with throat pain or discomfort.  Warm or cold liquids can also be helpful in relieving throat pain.  For headache, pain or general discomfort, you can use Ibuprofen or Tylenol as directed.   Some authorities believe that zinc sprays or the use of Echinacea may shorten the course of your symptoms.   If you have been instructed to have an in-person evaluation today at a local Urgent Care facility, please use the link below. It will take you to a list of all of our available [facility], including address, phone number and hours of operation. Please do not delay care.  [facility]  If you or a family member do not have a primary care provider, use the link below to schedule a visit and establish care. When you choose a [facility] primary care physician or advanced practice provider, you gain a long-term partner in health. Find a Primary Care Provider  Learn more about [facility] in-office and virtual care options: [facility]

## 2021-01-11 NOTE — Progress Notes (Signed)
Virtual Visit Consent   RHEYA WOLAK, you are scheduled for a virtual visit with a [facility] provider today.     Just as with appointments in the office, your consent must be obtained to participate.  Your consent will be active for this visit and any virtual visit you may have with one of our providers in the next 365 days.     If you have a MyChart account, a copy of this consent can be sent to you electronically.  All virtual visits are billed to your insurance company just like a traditional visit in the office.    As this is a virtual visit, video technology does not allow for your provider to perform a traditional examination.  This may limit your provider's ability to fully assess your condition.  If your provider identifies any concerns that need to be evaluated in person or the need to arrange testing (such as labs, EKG, etc.), we will make arrangements to do so.     Although advances in technology are sophisticated, we cannot ensure that it will always work on either your end or our end.  If the connection with a video visit is poor, the visit may have to be switched to a telephone visit.  With either a video or telephone visit, we are not always able to ensure that we have a secure connection.     I need to obtain your verbal consent now.   Are you willing to proceed with your visit today?    Junior D Luzadder has provided verbal consent on 01/11/2021 for a virtual visit (video or telephone).   Leeanne Rio, [place]   Date: 01/11/2021 4:23 PM   Virtual Visit via Video Note   I, Leeanne Rio, connected with  Melanie Cisneros  ([id number], March 09, 1991) on 01/11/21 at  4:15 PM EDT by a video-enabled telemedicine application and verified that I am speaking with the correct person using two identifiers.  Location: Patient: Virtual Visit Location Patient: Home Provider: Virtual Visit Location Provider: Home Office   I discussed the limitations of evaluation and  management by telemedicine and the availability of in person appointments. The patient expressed understanding and agreed to proceed.    History of Present Illness: Melanie Cisneros is a 30 y.o. who identifies as a female who was assigned female at birth, and is being seen today for cough and congestion over the past 4 days. Notes just getting over COVID about 3 weeks ago with full recovery. Over past 4 days with cough, nasal congestion and some occasional hoarseness.   HPI: HPI  Problems:  Patient Active Problem List   Diagnosis Date Noted   Bacterial vaginosis 05/13/2020   BMI 45.0-49.9, adult ([place]) 06/06/2016   Psychosocial stressors 02/25/2016   Ptyalism 02/25/2016   Rubella non-immune status, antepartum 01/27/2016    Allergies:  Allergies  Allergen Reactions   Amoxicillin Anaphylaxis, Hives and Other (See Comments)    Has patient had a PCN reaction causing immediate rash, facial/tongue/throat swelling, SOB or lightheadedness with hypotension: Yes Has patient had a PCN reaction causing severe rash involving mucus membranes or skin necrosis: No Has patient had a PCN reaction that required hospitalization No Has patient had a PCN reaction occurring within the last 10 years: No If all of the above answers are "NO", then may proceed with Cephalosporin use.   Penicillins Anaphylaxis, Hives and Other (See Comments)    Has patient had a PCN reaction causing immediate rash,  facial/tongue/throat swelling, SOB or lightheadedness with hypotension: Yes Has patient had a PCN reaction causing severe rash involving mucus membranes or skin necrosis: No Has patient had a PCN reaction that required hospitalization No Has patient had a PCN reaction occurring within the last 10 years: No If all of the above answers are "NO", then may proceed with Cephalosporin use.   Medications:  Current Outpatient Medications:    benzonatate (TESSALON) 100 MG capsule, Take 1 capsule (100 mg total) by mouth 3  (three) times daily as needed for cough., Disp: 30 capsule, Rfl: 0  Observations/Objective: Patient is well-developed, well-nourished in no acute distress.  Resting comfortably at home.  Head is normocephalic, atraumatic.  No labored breathing. Speech is clear and coherent with logical content.  Patient is alert and oriented at baseline.   Assessment and Plan: 1. Viral URI - benzonatate (TESSALON) 100 MG capsule; Take 1 capsule (100 mg total) by mouth 3 (three) times daily as needed for cough.  Dispense: 30 capsule; Refill: 0 Supportive measures and OTC medications reviewed. Rx Tessalon for cough. Follow-up if symptoms are not resolving, anything worsens or new symptoms develop.  Follow Up Instructions: I discussed the assessment and treatment plan with the patient. The patient was provided an opportunity to ask questions and all were answered. The patient agreed with the plan and demonstrated an understanding of the instructions.  A copy of instructions were sent to the patient via MyChart.  The patient was advised to call back or seek an in-person evaluation if the symptoms worsen or if the condition fails to improve as anticipated.  Time:  I spent 12 minutes with the patient via telehealth technology discussing the above problems/concerns.    Leeanne Rio, PA-C

## 2021-01-29 ENCOUNTER — Ambulatory Visit: Payer: Medicaid Other | Admitting: Obstetrics and Gynecology

## 2021-02-18 ENCOUNTER — Encounter: Payer: Self-pay | Admitting: Family Medicine

## 2021-02-18 ENCOUNTER — Encounter: Payer: Self-pay | Admitting: Obstetrics and Gynecology

## 2021-02-18 ENCOUNTER — Other Ambulatory Visit: Payer: Self-pay

## 2021-02-18 ENCOUNTER — Ambulatory Visit (INDEPENDENT_AMBULATORY_CARE_PROVIDER_SITE_OTHER): Payer: Medicaid Other | Admitting: Obstetrics and Gynecology

## 2021-02-18 ENCOUNTER — Other Ambulatory Visit (HOSPITAL_COMMUNITY)
Admission: RE | Admit: 2021-02-18 | Discharge: 2021-02-18 | Disposition: A | Discharge status: Home / Self Care | Payer: Medicaid Other | Source: Ambulatory Visit | Attending: Obstetrics and Gynecology | Admitting: Obstetrics and Gynecology

## 2021-02-18 VITALS — BP 129/79 | HR 57 | Ht 65.0 in | Wt 280.5 lb

## 2021-02-18 DIAGNOSIS — Z113 Encounter for screening for infections with a predominantly sexual mode of transmission: Secondary | ICD-10-CM | POA: Diagnosis not present

## 2021-02-18 DIAGNOSIS — R1032 Left lower quadrant pain: Secondary | ICD-10-CM | POA: Insufficient documentation

## 2021-02-18 NOTE — Progress Notes (Signed)
30 yo P1 presenting today for the evaluation of a 23-month history of intermittent left lower quadrant pain. Patient states that the pain occurs randomly. It typically last 5-15 minutes. It resolves with ibuprofen but she doesn't like taking medication. Patient reports a monthly period lasting 4-5 days. She has not noticed a correlation between the pain and her menses. She recently was made aware that her maternal grandmother was diagnosed with colon cancer and is concerned that the pain may be an early indication of cancer. She reports a regular daily bowel movement. She denies any relationship between her pain and bowel movement. She recently ended a relationship and desires STI testing. Patient is without any other complaints.   Past Medical History:  Diagnosis Date   Bacterial vaginosis    Chlamydia    Gonorrhea    Obesity    UTI (lower urinary tract infection)    Past Surgical History:  Procedure Laterality Date   WISDOM TOOTH EXTRACTION     Family History  Problem Relation Age of Onset   Hypertension Mother    Healthy Father    Social History   Tobacco Use   Smoking status: Never   Smokeless tobacco: Never  Vaping Use   Vaping Use: Never used  Substance Use Topics   Alcohol use: Yes    Comment: socially   Drug use: No   Family History  Problem Relation Age of Onset   Hypertension Mother    Healthy Father    ROS See pertinent in HPI. All other systems reviewed and non contributory Blood pressure 129/79, pulse (!) 57, height 5\' 5"  (1.651 m), weight 280 lb 8 oz (127.2 kg). GENERAL: Well-developed, well-nourished female in no acute distress.  ABDOMEN: Soft, nontender, nondistended. No organomegaly. PELVIC: Not performed EXTREMITIES: No cyanosis, clubbing, or edema, 2+ distal pulses.  A/P 30 yo with LLQ pain - Pelvic ultrasound ordered to rule out GYN pathology - STI screening collected - Patient referred to GI for evaluation of LLQ pain - Patient will be contacted  with abnormal results

## 2021-02-19 LAB — CERVICOVAGINAL ANCILLARY ONLY
Bacterial Vaginitis (gardnerella): NEGATIVE
Candida Glabrata: NEGATIVE
Candida Vaginitis: NEGATIVE
Chlamydia: NEGATIVE
Comment: NEGATIVE
Comment: NEGATIVE
Comment: NEGATIVE
Comment: NEGATIVE
Comment: NEGATIVE
Comment: NORMAL
Neisseria Gonorrhea: NEGATIVE
Trichomonas: NEGATIVE

## 2021-02-19 LAB — HIV ANTIBODY (ROUTINE TESTING W REFLEX): HIV Screen 4th Generation wRfx: NONREACTIVE

## 2021-02-19 LAB — HEPATITIS C ANTIBODY: Hep C Virus Ab: <0.1 s/co ratio (ref 0.0–0.9)

## 2021-02-19 LAB — RPR: RPR Ser Ql: NONREACTIVE

## 2021-02-19 LAB — HEPATITIS B SURFACE ANTIGEN: Hepatitis B Surface Ag: NEGATIVE

## 2021-02-26 ENCOUNTER — Ambulatory Visit (HOSPITAL_BASED_OUTPATIENT_CLINIC_OR_DEPARTMENT_OTHER)
Admission: RE | Admit: 2021-02-26 | Discharge: 2021-02-26 | Disposition: A | Discharge status: Home / Self Care | Payer: Medicaid Other | Source: Ambulatory Visit | Attending: Obstetrics and Gynecology | Admitting: Obstetrics and Gynecology

## 2021-02-26 ENCOUNTER — Other Ambulatory Visit: Payer: Self-pay

## 2021-02-26 DIAGNOSIS — R1032 Left lower quadrant pain: Secondary | ICD-10-CM | POA: Insufficient documentation

## 2021-03-26 ENCOUNTER — Ambulatory Visit (HOSPITAL_COMMUNITY)
Admission: EM | Admit: 2021-03-26 | Discharge: 2021-03-26 | Disposition: A | Discharge status: Home / Self Care | Payer: Medicaid Other | Attending: Family Medicine | Admitting: Family Medicine

## 2021-03-26 ENCOUNTER — Encounter (HOSPITAL_COMMUNITY): Payer: Self-pay

## 2021-03-26 ENCOUNTER — Other Ambulatory Visit: Payer: Self-pay

## 2021-03-26 DIAGNOSIS — M25511 Pain in right shoulder: Secondary | ICD-10-CM | POA: Insufficient documentation

## 2021-03-26 DIAGNOSIS — R202 Paresthesia of skin: Secondary | ICD-10-CM | POA: Diagnosis not present

## 2021-03-26 DIAGNOSIS — N760 Acute vaginitis: Secondary | ICD-10-CM | POA: Diagnosis not present

## 2021-03-26 LAB — POCT URINALYSIS DIPSTICK, ED / UC
Bilirubin Urine: NEGATIVE
Glucose, UA: NEGATIVE mg/dL
Hgb urine dipstick: NEGATIVE
Ketones, ur: NEGATIVE mg/dL
Nitrite: NEGATIVE
Protein, ur: NEGATIVE mg/dL
Specific Gravity, Urine: 1.025 (ref 1.005–1.030)
Urobilinogen, UA: 0.2 mg/dL (ref 0.0–1.0)
pH: 5.5 (ref 5.0–8.0)

## 2021-03-26 MED ORDER — CYCLOBENZAPRINE HCL 10 MG PO TABS: 10.0000 mg | ORAL_TABLET | Freq: Every day | ORAL | 0 refills | Status: DC

## 2021-03-26 MED ORDER — DICLOFENAC SODIUM 50 MG PO TBEC: 50.0000 mg | DELAYED_RELEASE_TABLET | Freq: Two times a day (BID) | ORAL | 0 refills | Status: DC

## 2021-03-26 MED ORDER — FLUCONAZOLE 150 MG PO TABS: 150.0000 mg | ORAL_TABLET | Freq: Every day | ORAL | 0 refills | Status: DC

## 2021-03-26 NOTE — Discharge Instructions (Addendum)
Vaginal testing results will result in 1-2 days, if any additional treatment needed we will notify via phone. Take medication as prescribed. If shoulder symptoms worsen, follow-up at [facility] for further workup of right shoulder pain Urine is unremarkable

## 2021-03-26 NOTE — ED Triage Notes (Signed)
Pt presents with R shoulder pain x 1 week. Pt states she has tingling on the R side of her body. States she has a slight pool behind her R eye.   Pt states she has vaginal odor and discharge x 1 week. States she wants to test for UTI.

## 2021-03-26 NOTE — ED Provider Notes (Signed)
[facility]    CSN: [id number] Arrival date & time: 03/26/21  1543      History   Chief Complaint Chief Complaint  Patient presents with   Shoulder Pain    HPI Melanie Cisneros is a 30 y.o. female.   HPI Patient presents for evaluation of right shoulder pain, right side numbness, vaginal discharge and concern for UTI.  Right shoulder pain with paresthesias times two nights. She has taken ibuprofen without relief of symptoms. Patient reports r that her bed broke and pain in the shoulders developed after she lifted a center-block to fix the frame of her broken bed. Past Medical History:  Diagnosis Date   Bacterial vaginosis    Chlamydia    Gonorrhea    Obesity    UTI (lower urinary tract infection)     Patient Active Problem List   Diagnosis Date Noted   Bacterial vaginosis 05/13/2020   BMI 45.0-49.9, adult ([place]) 06/06/2016   Psychosocial stressors 02/25/2016   Ptyalism 02/25/2016   Rubella non-immune status, antepartum 01/27/2016    Past Surgical History:  Procedure Laterality Date   WISDOM TOOTH EXTRACTION      OB History     Gravida  1   Para  1   Term  1   Preterm      AB      Living  1      SAB      IAB      Ectopic      Multiple  0   Live Births  1            Home Medications    Prior to Admission medications   Medication Sig Start Date End Date Taking? Authorizing Provider  cyclobenzaprine (FLEXERIL) 10 MG tablet Take 1 tablet (10 mg total) by mouth at bedtime. 03/26/21  Yes Scot Jun, FNP  diclofenac (VOLTAREN) 50 MG EC tablet Take 1 tablet (50 mg total) by mouth 2 (two) times daily. 03/26/21  Yes Scot Jun, FNP  fluconazole (DIFLUCAN) 150 MG tablet Take 1 tablet (150 mg total) by mouth daily. 03/26/21  Yes Scot Jun, FNP  benzonatate (TESSALON) 100 MG capsule Take 1 capsule (100 mg total) by mouth 3 (three) times daily as needed for cough. 01/11/21   [name], PA-C  albuterol  (VENTOLIN HFA) 108 (90 Base) MCG/ACT inhaler Inhale 1-2 puffs into the lungs every 6 (six) hours as needed for wheezing or shortness of breath. Patient not taking: Reported on 04/14/2020 10/04/19 04/27/20  [name], FNP    Family History Family History  Problem Relation Age of Onset   Hypertension Mother    Healthy Father     Social History Social History   Tobacco Use   Smoking status: Never   Smokeless tobacco: Never  Vaping Use   Vaping Use: Never used  Substance Use Topics   Alcohol use: Yes    Comment: socially   Drug use: No     Allergies   Amoxicillin and Penicillins   Review of Systems Review of Systems Pertinent negatives listed in HPI  Physical Exam Triage Vital Signs ED Triage Vitals  Enc Vitals Group     BP 03/26/21 1636 125/76     Pulse Rate 03/26/21 1640 84     Resp 03/26/21 1640 17     Temp --      Temp src --      SpO2 03/26/21 1640 100 %  Weight --      Height --      Head Circumference --      Peak Flow --      Pain Score 03/26/21 1633 5     Pain Loc --      Pain Edu? --      Excl. in [place]? --    No data found.  Updated Vital Signs BP 135/70 (BP Location: Right Arm)   Pulse 84   Resp 17   LMP  (LMP Unknown)   SpO2 100%   Visual Acuity Right Eye Distance:   Left Eye Distance:   Bilateral Distance:    Right Eye Near:   Left Eye Near:    Bilateral Near:     Physical Exam Constitutional:      Appearance: Normal appearance.  HENT:     Head: Normocephalic.  Eyes:     Extraocular Movements: Extraocular movements intact.     Pupils: Pupils are equal, round, and reactive to light.  Cardiovascular:     Rate and Rhythm: Normal rate and regular rhythm.  Pulmonary:     Effort: Pulmonary effort is normal.     Breath sounds: Normal breath sounds and air entry.  Musculoskeletal:     Right shoulder: Tenderness present. No swelling. Normal range of motion.     Right hand: No bony tenderness. Normal range of motion.   Neurological:     Mental Status: She is alert.  Psychiatric:        Attention and Perception: Attention normal.        Speech: Speech normal.        Judgment: Judgment normal.    UC Treatments / Results  Labs (all labs ordered are listed, but only abnormal results are displayed) Labs Reviewed  POCT URINALYSIS DIPSTICK, ED / UC - Abnormal; Notable for the following components:      Result Value   Leukocytes,Ua SMALL (*)    All other components within normal limits  CERVICOVAGINAL ANCILLARY ONLY    EKG   Radiology No results found.  Procedures Procedures (including critical care time)  Medications Ordered in UC Medications - No data to display  Initial Impression / Assessment and Plan / UC Course  I have reviewed the triage vital signs and the nursing notes.  Pertinent labs & imaging results that were available during my care of the patient were reviewed by me and considered in my medical decision making (see chart for details).     Right shoulder pain likely secondary to shoulder strain treating with Voltaren 50 mg twice daily as needed for anti-inflammatory action along with cyclobenzaprine at bedtime.  Also encouraged to apply heat.  Follow-up with orthopedics if symptoms worsen or do not improve. Acute vaginitis treating with Diflucan.  Vaginal cytology pending.  Advised patient she will be notified if any additional treatment is warranted.   Final Clinical Impressions(s) / UC Diagnoses   Final diagnoses:  Right shoulder pain, unspecified chronicity  Acute vaginitis  Paresthesia     Discharge Instructions      Vaginal testing results will result in 1-2 days, if any additional treatment needed we will notify via phone. Take medication as prescribed. If shoulder symptoms worsen, follow-up at [facility] for further workup of right shoulder pain Urine is unremarkable      ED Prescriptions     Medication Sig Dispense Auth. Provider   diclofenac  (VOLTAREN) 50 MG EC tablet Take 1 tablet (50 mg total) by mouth  2 (two) times daily. 20 tablet Scot Jun, FNP   cyclobenzaprine (FLEXERIL) 10 MG tablet Take 1 tablet (10 mg total) by mouth at bedtime. 12 tablet Scot Jun, FNP   fluconazole (DIFLUCAN) 150 MG tablet Take 1 tablet (150 mg total) by mouth daily. 2 tablet Scot Jun, FNP      PDMP not reviewed this encounter.   Scot Jun, [place] 03/28/21 [phone]

## 2021-03-29 ENCOUNTER — Telehealth (HOSPITAL_COMMUNITY): Payer: Self-pay | Admitting: Emergency Medicine

## 2021-03-29 DIAGNOSIS — Z20822 Contact with and (suspected) exposure to covid-19: Secondary | ICD-10-CM | POA: Diagnosis not present

## 2021-03-29 LAB — CERVICOVAGINAL ANCILLARY ONLY
Bacterial Vaginitis (gardnerella): POSITIVE — AB
Candida Glabrata: NEGATIVE
Candida Vaginitis: NEGATIVE
Chlamydia: NEGATIVE
Comment: NEGATIVE
Comment: NEGATIVE
Comment: NEGATIVE
Comment: NEGATIVE
Comment: NEGATIVE
Comment: NORMAL
Neisseria Gonorrhea: NEGATIVE
Trichomonas: NEGATIVE

## 2021-03-29 MED ORDER — METRONIDAZOLE 0.75 % VA GEL: 1.0000 | Freq: Every day | VAGINAL | 0 refills | Status: AC

## 2021-04-19 ENCOUNTER — Other Ambulatory Visit: Payer: Self-pay | Admitting: Family Medicine

## 2021-04-20 ENCOUNTER — Other Ambulatory Visit: Payer: Self-pay | Admitting: Family Medicine

## 2021-04-22 ENCOUNTER — Telehealth: Payer: Self-pay | Admitting: Family Medicine

## 2021-04-22 NOTE — Telephone Encounter (Signed)
Called pt. Person answers phone, call disconnected. Called pt a second time; VM left stating I am calling to follow up with patient and will also send MyChart message.

## 2021-04-22 NOTE — Telephone Encounter (Signed)
Patient needing to speak to someone about getting a different med for her yeast infection or can give her a name of something she can buy over the counter.  Patient have mychart. [phone]

## 2021-05-06 ENCOUNTER — Emergency Department (HOSPITAL_COMMUNITY): Payer: Medicaid Other

## 2021-05-06 ENCOUNTER — Other Ambulatory Visit: Payer: Self-pay

## 2021-05-06 ENCOUNTER — Emergency Department (HOSPITAL_COMMUNITY)
Admission: EM | Admit: 2021-05-06 | Discharge: 2021-05-07 | Disposition: A | Discharge status: Home / Self Care | Payer: Medicaid Other | Attending: Emergency Medicine | Admitting: Emergency Medicine

## 2021-05-06 DIAGNOSIS — N9489 Other specified conditions associated with female genital organs and menstrual cycle: Secondary | ICD-10-CM | POA: Insufficient documentation

## 2021-05-06 DIAGNOSIS — D72829 Elevated white blood cell count, unspecified: Secondary | ICD-10-CM | POA: Insufficient documentation

## 2021-05-06 DIAGNOSIS — J189 Pneumonia, unspecified organism: Secondary | ICD-10-CM | POA: Insufficient documentation

## 2021-05-06 DIAGNOSIS — R059 Cough, unspecified: Secondary | ICD-10-CM | POA: Diagnosis not present

## 2021-05-06 DIAGNOSIS — R0902 Hypoxemia: Secondary | ICD-10-CM | POA: Diagnosis not present

## 2021-05-06 DIAGNOSIS — Z20822 Contact with and (suspected) exposure to covid-19: Secondary | ICD-10-CM | POA: Insufficient documentation

## 2021-05-06 DIAGNOSIS — R000 Tachycardia, unspecified: Secondary | ICD-10-CM | POA: Diagnosis not present

## 2021-05-06 DIAGNOSIS — I10 Essential (primary) hypertension: Secondary | ICD-10-CM | POA: Diagnosis not present

## 2021-05-06 DIAGNOSIS — R0789 Other chest pain: Secondary | ICD-10-CM | POA: Diagnosis not present

## 2021-05-06 DIAGNOSIS — R079 Chest pain, unspecified: Secondary | ICD-10-CM | POA: Diagnosis not present

## 2021-05-06 LAB — CBC WITH DIFFERENTIAL/PLATELET
Abs Immature Granulocytes: 0.06 10*3/uL (ref 0.00–0.07)
Basophils Absolute: 0.1 10*3/uL (ref 0.0–0.1)
Basophils Relative: 0 %
Eosinophils Absolute: 0.1 10*3/uL (ref 0.0–0.5)
Eosinophils Relative: 0 %
HCT: 40.2 % (ref 36.0–46.0)
Hemoglobin: 13.4 g/dL (ref 12.0–15.0)
Immature Granulocytes: 0 %
Lymphocytes Relative: 10 %
Lymphs Abs: 1.8 10*3/uL (ref 0.7–4.0)
MCH: 27.6 pg (ref 26.0–34.0)
MCHC: 33.3 g/dL (ref 30.0–36.0)
MCV: 82.7 fL (ref 80.0–100.0)
Monocytes Absolute: 1.2 10*3/uL — ABNORMAL HIGH (ref 0.1–1.0)
Monocytes Relative: 7 %
Neutro Abs: 15 10*3/uL — ABNORMAL HIGH (ref 1.7–7.7)
Neutrophils Relative %: 83 %
Platelets: 297 10*3/uL (ref 150–400)
RBC: 4.86 MIL/uL (ref 3.87–5.11)
RDW: 13.7 % (ref 11.5–15.5)
WBC: 18.2 10*3/uL — ABNORMAL HIGH (ref 4.0–10.5)
nRBC: 0 % (ref 0.0–0.2)

## 2021-05-06 LAB — TROPONIN I (HIGH SENSITIVITY)
Troponin I (High Sensitivity): 5 ng/L (ref <18)
Troponin I (High Sensitivity): 3 ng/L (ref <18)

## 2021-05-06 LAB — RESP PANEL BY RT-PCR (FLU A&B, COVID) ARPGX2
Influenza A by PCR: NEGATIVE
Influenza B by PCR: NEGATIVE
SARS Coronavirus 2 by RT PCR: NEGATIVE

## 2021-05-06 LAB — BASIC METABOLIC PANEL
Anion gap: 8 (ref 5–15)
BUN: 9 mg/dL (ref 6–20)
CO2: 23 mmol/L (ref 22–32)
Calcium: 8.9 mg/dL (ref 8.9–10.3)
Chloride: 108 mmol/L (ref 98–111)
Creatinine, Ser: 0.87 mg/dL (ref 0.44–1.00)
GFR, Estimated: >60 mL/min (ref >60)
Glucose, Bld: 100 mg/dL — ABNORMAL HIGH (ref 70–99)
Potassium: 3.6 mmol/L (ref 3.5–5.1)
Sodium: 139 mmol/L (ref 135–145)

## 2021-05-06 LAB — D-DIMER, QUANTITATIVE: D-Dimer, Quant: 0.98 ug/mL-FEU — ABNORMAL HIGH (ref 0.00–0.50)

## 2021-05-06 LAB — I-STAT BETA HCG BLOOD, ED (MC, WL, AP ONLY): I-stat hCG, quantitative: <5 m[IU]/mL (ref <5)

## 2021-05-06 MED ORDER — DOXYCYCLINE HYCLATE 100 MG PO CAPS: 100.0000 mg | ORAL_CAPSULE | Freq: Two times a day (BID) | ORAL | 0 refills | Status: AC

## 2021-05-06 MED ORDER — ACETAMINOPHEN 500 MG PO TABS
1000.0000 mg | ORAL_TABLET | ORAL | Status: DC | PRN
  Administered 2021-05-06: 22:00:00 1000 mg via ORAL
  Filled 2021-05-06: qty 2

## 2021-05-06 MED ORDER — DOXYCYCLINE HYCLATE 100 MG PO TABS
100.0000 mg | ORAL_TABLET | Freq: Once | ORAL | Status: AC
  Administered 2021-05-06: 22:00:00 100 mg via ORAL
  Filled 2021-05-06: qty 1

## 2021-05-06 MED ORDER — LACTATED RINGERS IV BOLUS
1000.0000 mL | Freq: Once | INTRAVENOUS | Status: AC
  Administered 2021-05-06: 22:00:00 1000 mL via INTRAVENOUS

## 2021-05-06 MED ORDER — HYDROCOD POLST-CPM POLST ER 10-8 MG/5ML PO SUER
5.0000 mL | Freq: Once | ORAL | Status: AC
  Administered 2021-05-06: 5 mL via ORAL
  Filled 2021-05-06: qty 5

## 2021-05-06 NOTE — ED Triage Notes (Signed)
Pt with generalized weakness and feeling unwell today. CP began 1 hour ago. 324 ASA given by EMS.

## 2021-05-06 NOTE — ED Provider Notes (Signed)
Emergency Medicine Provider Triage Evaluation Note  Melanie Cisneros , a 30 y.o. female  was evaluated in triage.  Pt complains of flulike illness and chest pain.  She reports that while at work she started having a cough, rhinorrhea, nasal congestion and generalized weakness.  Patient says she also "started feeling weird."  Patient is unable to explain what she means by this.  Patient reports that she went home and after napping started having left-sided chest pain.  Patient describes pain as a stabbing and squeezing.  Pain reports radiation to left arm.  Review of Systems  Positive: Cough, rhinorrhea, nasal congestion generalized weakness, chest pain Negative: Nausea, vomiting, abdominal pain diaphoresis,   Physical Exam  BP 116/74 (BP Location: Right Arm)    Pulse (!) 114    Temp (!) 100.7 F (38.2 C) (Oral)    Resp 20    SpO2 97%  Gen:   Awake, no distress   Resp:  Normal effort, lungs clear to auscultation bilaterally MSK:   Moves extremities without difficulty  Other:  Chest pain is reproduced with inspiration as well as touch.  Medical Decision Making  Medically screening exam initiated at 6:47 PM.  Appropriate orders placed.  Melanie Cisneros was informed that the remainder of the evaluation will be completed by another provider, this initial triage assessment does not replace that evaluation, and the importance of remaining in the ED until their evaluation is complete.  Patient received 324 mg of aspirin with EMS.  Reports taking cold and flu medication prior to arrival in the emergency department.  Will obtain chest x-ray, COVID-19/influenza testing, and ACS work-up.   Melanie Cisneros 05/06/21 1850    Teressa Lower, MD 05/06/21 2328

## 2021-05-06 NOTE — ED Provider Notes (Signed)
[facility] EMERGENCY DEPARTMENT Provider Note   CSN: [id number] Arrival date & time: 05/06/21  1829     History Chief Complaint  Patient presents with   Weakness   Chest Pain    Melanie Cisneros is a 30 y.o. female who presents for evaluation of chest pain which started earlier today.  She states that this morning at work, she developed flulike symptoms including cough with dark-colored sputum, rhinorrhea, congestion, nausea, headache and generalized weakness.  She also developed left-sided chest pain worse when lying down.  She describes his pain as stabbing and feels that it radiates to her left arm.  She left work early and took some over-the-counter medications for her symptoms.  She noticed that her chest pain seemed to getting worse and called EMS.  She was given 324 mg ASA en route without relief.  Patient states daughter has been sick from daycare.  She otherwise denies shortness of breath, difficulty breathing, vomiting, diarrhea, urinary symptoms.   Weakness Associated symptoms: chest pain, cough, fever, headaches, myalgias and nausea   Associated symptoms: no abdominal pain, no diarrhea and no vomiting   Chest Pain Associated symptoms: cough, fatigue, fever, headache, nausea and weakness   Associated symptoms: no abdominal pain and no vomiting       Past Medical History:  Diagnosis Date   Bacterial vaginosis    Chlamydia    Gonorrhea    Obesity    UTI (lower urinary tract infection)     Patient Active Problem List   Diagnosis Date Noted   Bacterial vaginosis 05/13/2020   BMI 45.0-49.9, adult ([place]) 06/06/2016   Psychosocial stressors 02/25/2016   Ptyalism 02/25/2016   Rubella non-immune status, antepartum 01/27/2016    Past Surgical History:  Procedure Laterality Date   WISDOM TOOTH EXTRACTION       OB History     Gravida  1   Para  1   Term  1   Preterm      AB      Living  1      SAB      IAB      Ectopic       Multiple  0   Live Births  1           Family History  Problem Relation Age of Onset   Hypertension Mother    Healthy Father     Social History   Tobacco Use   Smoking status: Never   Smokeless tobacco: Never  Vaping Use   Vaping Use: Never used  Substance Use Topics   Alcohol use: Yes    Comment: socially   Drug use: No    Home Medications Prior to Admission medications   Medication Sig Start Date End Date Taking? Authorizing Provider  benzonatate (TESSALON) 100 MG capsule Take 1 capsule (100 mg total) by mouth 3 (three) times daily as needed for cough. 01/11/21   [name], PA-C  cyclobenzaprine (FLEXERIL) 10 MG tablet Take 1 tablet (10 mg total) by mouth at bedtime. 03/26/21   Scot Jun, FNP  diclofenac (VOLTAREN) 50 MG EC tablet Take 1 tablet (50 mg total) by mouth 2 (two) times daily. 03/26/21   Scot Jun, FNP  fluconazole (DIFLUCAN) 150 MG tablet Take 1 tablet (150 mg total) by mouth daily. 03/26/21   Scot Jun, FNP  albuterol (VENTOLIN HFA) 108 (90 Base) MCG/ACT inhaler Inhale 1-2 puffs into the lungs every 6 (six) hours as  needed for wheezing or shortness of breath. Patient not taking: Reported on 04/14/2020 10/04/19 04/27/20  [name], FNP    Allergies    Amoxicillin and Penicillins  Review of Systems   Review of Systems  Constitutional:  Positive for chills, fatigue and fever. Negative for appetite change.  HENT:  Negative for sore throat.   Eyes:  Negative for redness and visual disturbance.  Respiratory:  Positive for cough and chest tightness.   Cardiovascular:  Positive for chest pain.  Gastrointestinal:  Positive for nausea. Negative for abdominal pain, diarrhea and vomiting.  Endocrine: Negative.   Genitourinary: Negative.   Musculoskeletal:  Positive for myalgias.  Skin: Negative.   Neurological:  Positive for weakness and headaches.  Psychiatric/Behavioral: Negative.    All other systems reviewed and  are negative.  Physical Exam Updated Vital Signs BP 125/86    Pulse (!) 112    Temp (!) 100.7 F (38.2 C) (Oral)    Resp 10    SpO2 95%  Today's Vitals   05/06/21 2230 05/06/21 2245 05/06/21 2300 05/06/21 2304  BP: 130/80 106/65 (!) 114/56   Pulse: (!) 107 93 99   Resp: 17 (!) 26 (!) 24   Temp:    99.2 F (37.3 C)  TempSrc:    Oral  SpO2: 95% 95% 95%   PainSc:       There is no height or weight on file to calculate BMI.  Physical Exam Vitals and nursing note reviewed.  Constitutional:      General: She is not in acute distress.    Appearance: She is ill-appearing.  HENT:     Head: Atraumatic.     Nose: Nose normal.  Eyes:     Extraocular Movements: Extraocular movements intact.     Conjunctiva/sclera: Conjunctivae normal.  Cardiovascular:     Rate and Rhythm: Regular rhythm. Tachycardia present.     Pulses: Normal pulses.          Radial pulses are 2+ on the right side and 2+ on the left side.       Dorsalis pedis pulses are 2+ on the right side and 2+ on the left side.     Heart sounds: No murmur heard. Pulmonary:     Effort: Pulmonary effort is normal. No accessory muscle usage or respiratory distress.     Breath sounds: Normal breath sounds.     Comments: Lung clear to ausculation bilaterally. No tachypnea, no accessory muscle use, no acute distress, no increased work of breathing, no decrease in air movement  Abdominal:     General: Abdomen is flat. There is no distension.     Palpations: Abdomen is soft.     Tenderness: There is no abdominal tenderness.  Musculoskeletal:        General: Normal range of motion.     Cervical back: Normal range of motion.  Skin:    General: Skin is warm and dry.     Capillary Refill: Capillary refill takes less than 2 seconds.     Comments: Skin is hot to touch  Neurological:     General: No focal deficit present.     Mental Status: She is alert.  Psychiatric:        Mood and Affect: Mood normal.    ED Results / Procedures  / Treatments   Labs (all labs ordered are listed, but only abnormal results are displayed) Labs Reviewed  BASIC METABOLIC PANEL - Abnormal; Notable for the following components:  Result Value   Glucose, Bld 100 (*)    All other components within normal limits  CBC WITH DIFFERENTIAL/PLATELET - Abnormal; Notable for the following components:   WBC 18.2 (*)    Neutro Abs 15.0 (*)    Monocytes Absolute 1.2 (*)    All other components within normal limits  RESP PANEL BY RT-PCR (FLU A&B, COVID) ARPGX2  I-STAT BETA HCG BLOOD, ED ([facility], [facility], [facility] ONLY)  TROPONIN I (HIGH SENSITIVITY)  TROPONIN I (HIGH SENSITIVITY)    EKG None  Radiology DG Chest 2 View  Result Date: 05/06/2021 CLINICAL DATA:  Chest pain with cough. EXAM: CHEST - 2 VIEW COMPARISON:  Chest x-ray 10/04/2019. FINDINGS: There are minimal infrahilar opacities bilaterally. There is no pleural effusion or pneumothorax. Cardiomediastinal silhouette is within normal limits. Osseous structures are within normal limits. IMPRESSION: 1. Minimal infrahilar opacities favored is atelectasis. Infection not excluded. Electronically Signed   By: Ronney Asters M.D.   On: 05/06/2021 19:45    Procedures Procedures   Medications Ordered in ED Medications  acetaminophen (TYLENOL) tablet 1,000 mg (1,000 mg Oral Given 05/06/21 2138)  lactated ringers bolus 1,000 mL (1,000 mLs Intravenous New Bag/Given 05/06/21 2132)  doxycycline (VIBRA-TABS) tablet 100 mg (100 mg Oral Given 05/06/21 2138)    ED Course  I have reviewed the triage vital signs and the nursing notes.  Pertinent labs & imaging results that were available during my care of the patient were reviewed by me and considered in my medical decision making (see chart for details).    MDM Rules/Calculators/A&P                         This patient presents to the ED for concern of chest pain, this involves an extensive number of treatment options, and is a complaint that carries with it a  high risk of complications and morbidity.  The emergent differential diagnosis of chest pain includes: Acute coronary syndrome, pericarditis, aortic dissection, pulmonary embolism, tension pneumothorax, and esophageal rupture.  I do not believe the patient has an emergent cause of chest pain, other urgent/non-acute considerations include, but are not limited to: chronic angina, aortic stenosis, cardiomyopathy, myocarditis, mitral valve prolapse, pulmonary hypertension, hypertrophic obstructive cardiomyopathy (HOCM), aortic insufficiency, right ventricular hypertrophy, pneumonia, pleuritis, bronchitis, pneumothorax, tumor, gastroesophageal reflux disease (GERD), esophageal spasm, Mallory-Weiss syndrome, peptic ulcer disease, biliary disease, pancreatitis, functional gastrointestinal pain, cervical or thoracic disk disease or arthritis, shoulder arthritis, costochondritis, subacromial bursitis, anxiety or panic attack, herpes zoster, breast disorders, chest wall tumors, thoracic outlet syndrome, mediastinitis.    Additional history obtained:  Additional history obtained from EMS   Lab Tests:  I Ordered, reviewed, and interpreted labs.  The pertinent results include: BMP WNL, negative for flu and COVID, CBC with white count of 18.2.  Pregnancy test negative.  Troponin normal. Ddimer elevated   Imaging Studies ordered:  I ordered imaging studies including chest x-ray I independently visualized and interpreted imaging which showed some infrahilar opacities. I agree with the radiologist interpretation Given patient's overall history and physical exam, x-ray results likely indicate a developing pneumonia Additionally, patient had elevated D-dimer which necessitated CTA looking for PE. I independently visualized and interpreted imaging which shows no evidence of pulmonary embolism. Does confirm multifocal consolidation. I agree with radiologist interpretation   Cardiac Monitoring:  The patient was  maintained on a cardiac monitor.  I personally viewed and interpreted the cardiac monitored which showed an underlying rhythm of: Sinus tachycardia  Medicines ordered and prescription drug management:  I ordered medication including 1 L lactated ringer bolus for fever and tachycardia.  I additionally gave patient 1 g Tylenol for her fever of 100.7 F.  Given patient's imaging and physical exam, I started her first dose of doxycycline p.o. while she was receiving her fluids. Reevaluation of the patient after these medicines showed that the patient improved I have reviewed the patients home medicines and have made adjustments as needed    Reevaluation:  After the interventions noted above, I reevaluated the patient and found that they have :improved   Dispostion:  After consideration of the diagnostic results and the patients response to treatment feel that the patent would benefit from discharge with outpatient follow-up.   Community-acquired pneumonia-patient discharged home with instructions for 1 week course of doxycycline.  She is to follow-up with her primary care doctor for an x-ray in a few days to monitor response to treatment.  At home supportive care measures discussed.  We also discussed reasons that warrant return to the ED.  Patient had opportunity ask all questions and were answered to her satisfaction.  Patient is understanding and amenable to plan.    Final Clinical Impression(s) / ED Diagnoses Final diagnoses:  Community acquired pneumonia, unspecified laterality    Rx / DC Orders ED Discharge Orders          Ordered    doxycycline (VIBRAMYCIN) 100 MG capsule  2 times daily        05/06/21 2359             Tonye Pearson, [place] 05/07/21 0044    Lucrezia Starch, MD 05/10/21 [phone]

## 2021-05-06 NOTE — Discharge Instructions (Addendum)
You were diagnosed today with pneumonia, although it appears to be in its very early stages. Your COVID and flu tests were negative, and your fever responded well to the Tylenol and fluids. Your labwork was overall reassuring, however there was a small concern that you may have developed a blood clot. Luckily, your CT was negative for this, so at this point you are ready to discharge home with antibiotics and supportive care. I have sent in your antibiotic prescription to your pharmacy, otherwise please read the attached information on over the counter medications that are safe for you to take at home. Continue to drink plenty of fluids.   Please feel better soon, and return if you develop worsening shortness of breath, difficulty breathing or fever that won't resolve with Tylenol or motrin.

## 2021-05-07 ENCOUNTER — Emergency Department (HOSPITAL_COMMUNITY): Payer: Medicaid Other

## 2021-05-07 ENCOUNTER — Telehealth: Payer: Self-pay | Admitting: *Deleted

## 2021-05-07 MED ORDER — IOHEXOL 350 MG/ML SOLN
100.0000 mL | Freq: Once | INTRAVENOUS | Status: AC | PRN
  Administered 2021-05-07: 80 mL via INTRAVENOUS

## 2021-05-07 NOTE — ED Notes (Signed)
Patient verbalizes understanding of discharge instructions. Opportunity for questioning and answers were provided. Armband removed by staff, pt discharged from ED. Wheeled out to lobby  

## 2021-05-07 NOTE — ED Notes (Signed)
Patient transported to CT 

## 2021-05-07 NOTE — Telephone Encounter (Signed)
Pt called regarding pharmacy not filling Rx because prescribing EDP was not registered with medicaid.  RNCM called pharmacy and advised to utilize supervising EDP printed on Rx.

## 2021-05-08 ENCOUNTER — Telehealth: Payer: Self-pay

## 2021-05-08 ENCOUNTER — Telehealth: Payer: Medicaid Other | Admitting: Nurse Practitioner

## 2021-05-08 DIAGNOSIS — Z9189 Other specified personal risk factors, not elsewhere classified: Secondary | ICD-10-CM

## 2021-05-08 DIAGNOSIS — B001 Herpesviral vesicular dermatitis: Secondary | ICD-10-CM | POA: Diagnosis not present

## 2021-05-08 MED ORDER — VALACYCLOVIR HCL 1 G PO TABS: 2000.0000 mg | ORAL_TABLET | Freq: Two times a day (BID) | ORAL | 1 refills | Status: AC

## 2021-05-08 NOTE — Patient Instructions (Addendum)
We are sorry that you are not feeling well.  Here is how we plan to help!  Based on what you have shared with me it does look like you have a viral infection.    Most cold sores or fever blisters are small fluid filled blisters around the mouth caused by herpes simplex virus.  The most common strain of the virus causing cold sores is herpes simplex virus 1.  It can be spread by skin contact, sharing eating utensils, or even sharing towels.  Cold sores are contagious to other people until dry. (Approximately 5-7 days).  Wash your hands. You can spread the virus to your eyes through handling your contact lenses after touching the lesions.  Most people experience pain at the sight or tingling sensations in their lips that may begin before the ulcers erupt.  Herpes simplex is treatable but not curable.  It may lie dormant for a long time and then reappear due to stress or prolonged sun exposure.  Many patients have success in treating their cold sores with an over the counter topical called Abreva.  You may apply the cream up to 5 times daily (maximum 10 days) until healing occurs.  If you would like to use an oral antiviral medication to speed the healing of your cold sore, I have sent a prescription to your local pharmacy Valacyclovir 2 gm take one by mouth twice a day for 1 day    HOME CARE:  Wash your hands frequently. Do not pick at or rub the sore. Don't open the blisters. Avoid kissing other people during this time. Avoid sharing drinking glasses, eating utensils, or razors. Do not handle contact lenses unless you have thoroughly washed your hands with soap and warm water! Avoid oral sex during this time.  Herpes from sores on your mouth can spread to your partner's genital area. Avoid contact with anyone who has eczema or a weakened immune system. Cold sores are often triggered by exposure to intense sunlight, use a lip balm containing a sunscreen (SPF 30 or higher).  GET HELP RIGHT AWAY  IF:  Blisters look infected. Blisters occur near or in the eye. Symptoms last longer than 10 days. Your symptoms become worse.  MAKE SURE YOU:  Understand these instructions. Will watch your condition. Will get help right away if you are not doing well or get worse.       Please review your pharmacy choice.  Be sure that the pharmacy you have chosen is open so that you can pick up your prescription now.  If there is a problem you can message your provider in [place] to have the prescription routed to another pharmacy.    Your safety is important to [place].  If you have drug allergies check our prescription carefully.  For the next 24 hours you can use MyChart to ask questions about today's visit, request a non-urgent call back, or ask for a work or school excuse from your e-visit provider.  You will get an email in the next two days asking about your experience.

## 2021-05-08 NOTE — Telephone Encounter (Signed)
Transition Care Management Follow-up Telephone Call Date of discharge and from where: 05/07/2021-[facility]  How have you been since you were released from the hospital? Patient stated she is feeling better and has started her medications. Any questions or concerns? No  Items Reviewed: Did the pt receive and understand the discharge instructions provided? Yes  Medications obtained and verified? Yes  Other? No  Any new allergies since your discharge? No  Dietary orders reviewed? No Do you have support at home? Yes   Home Care and Equipment/Supplies: Were home health services ordered? not applicable If so, what is the name of the agency? N/A  Has the agency set up a time to come to the patient's home? not applicable Were any new equipment or medical supplies ordered?  No What is the name of the medical supply agency? N/A Were you able to get the supplies/equipment? not applicable Do you have any questions related to the use of the equipment or supplies? No  Functional Questionnaire: (I = Independent and D = Dependent) ADLs: I  Bathing/Dressing- I  Meal Prep- I  Eating- I  Maintaining continence- I  Transferring/Ambulation- I  Managing Meds- I  Follow up appointments reviewed:  PCP Hospital f/u appt confirmed? No   Specialist Hospital f/u appt confirmed? No   Are transportation arrangements needed? No  If their condition worsens, is the pt aware to call PCP or go to the Emergency Dept.? Yes Was the patient provided with contact information for the PCP's office or ED? Yes Was to pt encouraged to call back with questions or concerns? Yes

## 2021-05-08 NOTE — Progress Notes (Signed)
Virtual Visit Consent   Melanie Cisneros, you are scheduled for a virtual visit with a [facility] provider today.     Just as with appointments in the office, your consent must be obtained to participate.  Your consent will be active for this visit and any virtual visit you may have with one of our providers in the next 365 days.     If you have a MyChart account, a copy of this consent can be sent to you electronically.  All virtual visits are billed to your insurance company just like a traditional visit in the office.    As this is a virtual visit, video technology does not allow for your provider to perform a traditional examination.  This may limit your provider's ability to fully assess your condition.  If your provider identifies any concerns that need to be evaluated in person or the need to arrange testing (such as labs, EKG, etc.), we will make arrangements to do so.     Although advances in technology are sophisticated, we cannot ensure that it will always work on either your end or our end.  If the connection with a video visit is poor, the visit may have to be switched to a telephone visit.  With either a video or telephone visit, we are not always able to ensure that we have a secure connection.     I need to obtain your verbal consent now.   Are you willing to proceed with your visit today?    Debrina D Molitor has provided verbal consent on 05/08/2021 for a virtual visit (video or telephone).   Gildardo Pounds, NP   Date: 05/08/2021 4:32 PM   Virtual Visit via Video Note   I, Gildardo Pounds, connected with  Melanie Cisneros  ([id number], Nov 15, 1990) on 05/08/21 at  4:30 PM EST by a video-enabled telemedicine application and verified that I am speaking with the correct person using two identifiers.  Location: Patient: Virtual Visit Location Patient: Home Provider: Virtual Visit Location Provider: Home Office   I discussed the limitations of evaluation and management by  telemedicine and the availability of in person appointments. The patient expressed understanding and agreed to proceed.    History of Present Illness: Melanie Cisneros is a 30 y.o. who identifies as a female who was assigned female at birth, and is being seen today for cold sore.  HPI:  She has a cold sore out break on the left corner of her lip. Notes increased stress as well as recent re occurring respiratory infections. She denies any lesions on her vagina. Initial breakout was 6 months ago.    Problems:  Patient Active Problem List   Diagnosis Date Noted   Bacterial vaginosis 05/13/2020   BMI 45.0-49.9, adult ([place]) 06/06/2016   Psychosocial stressors 02/25/2016   Ptyalism 02/25/2016   Rubella non-immune status, antepartum 01/27/2016    Allergies:  Allergies  Allergen Reactions   Amoxicillin Anaphylaxis, Hives and Other (See Comments)    Has patient had a PCN reaction causing immediate rash, facial/tongue/throat swelling, SOB or lightheadedness with hypotension: Yes Has patient had a PCN reaction causing severe rash involving mucus membranes or skin necrosis: No Has patient had a PCN reaction that required hospitalization No Has patient had a PCN reaction occurring within the last 10 years: No If all of the above answers are "NO", then may proceed with Cephalosporin use.   Penicillins Anaphylaxis, Hives and Other (See Comments)  Has patient had a PCN reaction causing immediate rash, facial/tongue/throat swelling, SOB or lightheadedness with hypotension: Yes Has patient had a PCN reaction causing severe rash involving mucus membranes or skin necrosis: No Has patient had a PCN reaction that required hospitalization No Has patient had a PCN reaction occurring within the last 10 years: No If all of the above answers are "NO", then may proceed with Cephalosporin use.   Medications:  Current Outpatient Medications:    valACYclovir (VALTREX) 1000 MG tablet, Take 2 tablets (2,000 mg  total) by mouth 2 (two) times daily for 1 day., Disp: 4 tablet, Rfl: 1   benzonatate (TESSALON) 100 MG capsule, Take 1 capsule (100 mg total) by mouth 3 (three) times daily as needed for cough., Disp: 30 capsule, Rfl: 0   cyclobenzaprine (FLEXERIL) 10 MG tablet, Take 1 tablet (10 mg total) by mouth at bedtime., Disp: 12 tablet, Rfl: 0   diclofenac (VOLTAREN) 50 MG EC tablet, Take 1 tablet (50 mg total) by mouth 2 (two) times daily., Disp: 20 tablet, Rfl: 0   doxycycline (VIBRAMYCIN) 100 MG capsule, Take 1 capsule (100 mg total) by mouth 2 (two) times daily for 7 days., Disp: 14 capsule, Rfl: 0   fluconazole (DIFLUCAN) 150 MG tablet, Take 1 tablet (150 mg total) by mouth daily., Disp: 2 tablet, Rfl: 0  Observations/Objective: Patient is well-developed, well-nourished in no acute distress.  Resting comfortably  at home.  Head is normocephalic, atraumatic.  No labored breathing.  Speech is clear and coherent with logical content.  Patient is alert and oriented at baseline.    Assessment and Plan: 1. Herpes labialis - valACYclovir (VALTREX) 1000 MG tablet; Take 2 tablets (2,000 mg total) by mouth 2 (two) times daily for 1 day.  Dispense: 4 tablet; Refill: 1   Follow Up Instructions: I discussed the assessment and treatment plan with the patient. The patient was provided an opportunity to ask questions and all were answered. The patient agreed with the plan and demonstrated an understanding of the instructions.  A copy of instructions were sent to the patient via MyChart unless otherwise noted below.     The patient was advised to call back or seek an in-person evaluation if the symptoms worsen or if the condition fails to improve as anticipated.  Time:  I spent 10 minutes with the patient via telehealth technology discussing the above problems/concerns.    Gildardo Pounds, NP

## 2021-05-12 ENCOUNTER — Other Ambulatory Visit: Payer: Self-pay

## 2021-05-12 ENCOUNTER — Ambulatory Visit (HOSPITAL_COMMUNITY)
Admission: EM | Admit: 2021-05-12 | Discharge: 2021-05-12 | Disposition: A | Discharge status: Home / Self Care | Payer: Medicaid Other | Attending: Internal Medicine | Admitting: Internal Medicine

## 2021-05-12 ENCOUNTER — Encounter (HOSPITAL_COMMUNITY): Payer: Self-pay | Admitting: *Deleted

## 2021-05-12 DIAGNOSIS — J189 Pneumonia, unspecified organism: Secondary | ICD-10-CM

## 2021-05-12 NOTE — ED Triage Notes (Signed)
Pt called on mobile and Pt reported she was not in parking lot but went home to give her mother meds. Pt reports she was told it was OK to leave to go home.

## 2021-05-12 NOTE — ED Triage Notes (Signed)
PT called back to report she was walking into [facility]. Pt called with no answer.

## 2021-05-12 NOTE — ED Provider Notes (Signed)
[place]    CSN: [id number] Arrival date & time: 05/12/21  3220      History   Chief Complaint Chief Complaint  Patient presents with   Cough    HPI Melanie Cisneros is a 30 y.o. female.   30 year old female presents today "requesting a chest x-ray to make sure the pneumonia is gone".  She was seen in the emergency room on 05/06/21 with complaints of chest pain at that time.  Labs were performed which revealed a WBC count of 18.2, Otherwise unremarkable.  She had a chest x-ray completed which showed concern for possible infection versus atelectasis.  She also had a CTA performed to rule out PE, abnormality indicated "Multifocal pulmonary consolidation within the lung bases bilaterally, possibly infectious in the acute setting."  Patient was sent home with a prescription for doxycycline.  She states she has been taking this as prescribed and tolerating it well.  Overall she states she has significant improvement.  She still is coughing with some phlegm.  Her chest pain has resolved.  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  She denies any additional symptoms today.   Cough Associated symptoms: no shortness of breath and no wheezing    Past Medical History:  Diagnosis Date   Bacterial vaginosis    Chlamydia    Gonorrhea    Obesity    UTI (lower urinary tract infection)     Patient Active Problem List   Diagnosis Date Noted   Bacterial vaginosis 05/13/2020   BMI 45.0-49.9, adult ([place]) 06/06/2016   Psychosocial stressors 02/25/2016   Ptyalism 02/25/2016   Rubella non-immune status, antepartum 01/27/2016    Past Surgical History:  Procedure Laterality Date   WISDOM TOOTH EXTRACTION      OB History     Gravida  1   Para  1   Term  1   Preterm      AB      Living  1      SAB      IAB      Ectopic      Multiple  0   Live Births  1            Home Medications    Prior  to Admission medications   Medication Sig Start Date End Date Taking? Authorizing Provider  diclofenac (VOLTAREN) 50 MG EC tablet Take 1 tablet (50 mg total) by mouth 2 (two) times daily. 03/26/21   Scot Jun, FNP  doxycycline (VIBRAMYCIN) 100 MG capsule Take 1 capsule (100 mg total) by mouth 2 (two) times daily for 7 days. 05/07/21 05/14/21  Tonye Pearson, PA-C  albuterol (VENTOLIN HFA) 108 (90 Base) MCG/ACT inhaler Inhale 1-2 puffs into the lungs every 6 (six) hours as needed for wheezing or shortness of breath. Patient not taking: Reported on 04/14/2020 10/04/19 04/27/20  [name], FNP    Family History Family History  Problem Relation Age of Onset   Hypertension Mother    Healthy Father     Social History Social History   Tobacco Use   Smoking status: Never   Smokeless tobacco: Never  Vaping Use   Vaping Use: Never used  Substance Use Topics   Alcohol use: Yes    Comment: socially   Drug use: No     Allergies   Amoxicillin and Penicillins   Review of Systems Review of Systems  Constitutional: Negative.   HENT: Negative.    Eyes: Negative.   Respiratory:  Positive for cough. Negative for choking, chest tightness, shortness of breath, wheezing and stridor.   Cardiovascular: Negative.   Gastrointestinal: Negative.   Genitourinary: Negative.   Musculoskeletal: Negative.     Physical Exam Triage Vital Signs ED Triage Vitals  Enc Vitals Group     BP 05/12/21 1117 119/79     Pulse Rate 05/12/21 1117 64     Resp 05/12/21 1117 18     Temp 05/12/21 1117 98.5 F (36.9 C)     Temp src --      SpO2 05/12/21 1117 95 %     Weight --      Height --      Head Circumference --      Peak Flow --      Pain Score 05/12/21 1114 0     Pain Loc --      Pain Edu? --      Excl. in [place]? --    No data found.  Updated Vital Signs BP 119/79    Pulse 64    Temp 98.5 F (36.9 C)    Resp 18    LMP 05/07/2021    SpO2 95%   Visual Acuity Right Eye  Distance:   Left Eye Distance:   Bilateral Distance:    Right Eye Near:   Left Eye Near:    Bilateral Near:     Physical Exam Vitals and nursing note reviewed. Exam conducted with a chaperone present.  Constitutional:      General: She is not in acute distress.    Appearance: She is well-developed. She is obese. She is not ill-appearing, toxic-appearing or diaphoretic.  HENT:     Head: Normocephalic and atraumatic.     Right Ear: Tympanic membrane, ear canal and external ear normal. There is no impacted cerumen.     Left Ear: Tympanic membrane, ear canal and external ear normal. There is no impacted cerumen.     Nose: Nose normal. No congestion or rhinorrhea.     Mouth/Throat:     Mouth: Mucous membranes are moist.     Pharynx: No oropharyngeal exudate or posterior oropharyngeal erythema.  Eyes:     General: No scleral icterus.       Right eye: No discharge.        Left eye: No discharge.     Extraocular Movements: Extraocular movements intact.     Conjunctiva/sclera: Conjunctivae normal.     Pupils: Pupils are equal, round, and reactive to light.  Cardiovascular:     Rate and Rhythm: Normal rate and regular rhythm.     Pulses: Normal pulses.     Heart sounds: Normal heart sounds. No murmur heard.   No gallop.  Pulmonary:     Effort: Pulmonary effort is normal. No respiratory distress.     Breath sounds: Normal breath sounds. No stridor. No wheezing, rhonchi or rales.  Chest:     Chest wall: No tenderness.  Abdominal:     Palpations: Abdomen is soft.     Tenderness: There is no abdominal tenderness.  Musculoskeletal:        General: No swelling or tenderness. Normal range of motion.     Cervical back: Normal range of motion and neck supple. No tenderness.     Right lower leg: No edema.     Left lower leg: No edema.  Lymphadenopathy:     Cervical: No cervical  adenopathy.  Skin:    General: Skin is warm and dry.     Capillary Refill: Capillary refill takes less than 2  seconds.  Neurological:     Mental Status: She is alert.  Psychiatric:        Mood and Affect: Mood normal.     UC Treatments / Results  Labs (all labs ordered are listed, but only abnormal results are displayed) Labs Reviewed - No data to display  EKG   Radiology No results found.  Procedures Procedures (including critical care time)  Medications Ordered in UC Medications - No data to display  Initial Impression / Assessment and Plan / UC Course  I have reviewed the triage vital signs and the nursing notes.  Pertinent labs & imaging results that were available during my care of the patient were reviewed by me and considered in my medical decision making (see chart for details).     CAP -patient has 2 days left of doxycycline for a total of 4 capsules.  Continue and complete all antibiotics.  Clinically patient appears well, no indication to repeat chest x-ray this closely after being diagnosed.  Offered to recheck CBC today to ensure improvement, patient refused.  Recommended patient follow-up with a primary care physician in the next 6 to 8 weeks to recheck her CBC and chest x-ray at that point which would be more reliable to ensure complete resolution.  Final Clinical Impressions(s) / UC Diagnoses   Final diagnoses:  Community acquired bilateral lower lobe pneumonia     Discharge Instructions      Out of work until Monday, Jan 2. Continue taking all antibiotics until completed. Make an appointment with a new PCP to establish care and recheck CXR and CBC in 6-8 weeks. May continue taking mucinex 600mg  twice daily with lots of water. Spit out the phlegm.    ED Prescriptions   None    PDMP not reviewed this encounter.   Chaney Malling, [place] 05/12/21 1219

## 2021-05-12 NOTE — ED Triage Notes (Signed)
Pt reports she has a productive cough that has been on going for weeks. Pt wants a chest x-ray to check for PNA.

## 2021-05-12 NOTE — Discharge Instructions (Addendum)
Out of work until Monday, Jan 2. Continue taking all antibiotics until completed. Make an appointment with a new PCP to establish care and recheck CXR and CBC in 6-8 weeks. May continue taking mucinex 600mg  twice daily with lots of water. Spit out the phlegm.

## 2021-05-13 ENCOUNTER — Encounter: Payer: Self-pay | Admitting: Nurse Practitioner

## 2021-05-13 ENCOUNTER — Other Ambulatory Visit: Payer: Self-pay | Admitting: Nurse Practitioner

## 2021-05-13 MED ORDER — FLUCONAZOLE 150 MG PO TABS: 150.0000 mg | ORAL_TABLET | Freq: Once | ORAL | 0 refills | Status: AC

## 2021-05-26 ENCOUNTER — Encounter: Payer: Self-pay | Admitting: Nurse Practitioner

## 2021-05-26 ENCOUNTER — Other Ambulatory Visit: Payer: Self-pay | Admitting: Nurse Practitioner

## 2021-05-26 MED ORDER — FLUCONAZOLE 150 MG PO TABS: 150.0000 mg | ORAL_TABLET | Freq: Once | ORAL | 0 refills | Status: AC

## 2021-06-01 ENCOUNTER — Telehealth: Payer: Self-pay

## 2021-06-01 NOTE — Telephone Encounter (Signed)
Telephone encounter was:  Unsuccessful.  06/01/2021 Name: Melanie Cisneros MRN: [id number] DOB: [birth year]  Unsuccessful outbound call made today to assist with:   PCP Search  Outreach Attempt:  1st Attempt  Unable to reach pt as call is not going through.  [facility] Guide, Embedded Care Coordination [facility]  [place], [place] [address]  Main Phone: [phone]   E-mail: [email or web address]  Website: [email or web address]

## 2021-06-14 ENCOUNTER — Encounter (HOSPITAL_COMMUNITY): Payer: Self-pay

## 2021-06-14 ENCOUNTER — Emergency Department (HOSPITAL_COMMUNITY): Payer: Medicaid Other

## 2021-06-14 ENCOUNTER — Encounter (HOSPITAL_COMMUNITY): Payer: Self-pay | Admitting: Emergency Medicine

## 2021-06-14 ENCOUNTER — Ambulatory Visit (HOSPITAL_COMMUNITY)
Admission: EM | Admit: 2021-06-14 | Discharge: 2021-06-14 | Disposition: A | Discharge status: Home / Self Care | Payer: Medicaid Other | Attending: Internal Medicine | Admitting: Internal Medicine

## 2021-06-14 ENCOUNTER — Other Ambulatory Visit: Payer: Self-pay

## 2021-06-14 ENCOUNTER — Emergency Department (HOSPITAL_COMMUNITY)
Admission: EM | Admit: 2021-06-14 | Discharge: 2021-06-14 | Disposition: A | Discharge status: Home / Self Care | Payer: Medicaid Other | Attending: Emergency Medicine | Admitting: Emergency Medicine

## 2021-06-14 DIAGNOSIS — M7989 Other specified soft tissue disorders: Secondary | ICD-10-CM | POA: Diagnosis not present

## 2021-06-14 DIAGNOSIS — M25572 Pain in left ankle and joints of left foot: Secondary | ICD-10-CM | POA: Insufficient documentation

## 2021-06-14 DIAGNOSIS — B9689 Other specified bacterial agents as the cause of diseases classified elsewhere: Secondary | ICD-10-CM

## 2021-06-14 DIAGNOSIS — S93402A Sprain of unspecified ligament of left ankle, initial encounter: Secondary | ICD-10-CM | POA: Diagnosis not present

## 2021-06-14 DIAGNOSIS — W108XXA Fall (on) (from) other stairs and steps, initial encounter: Secondary | ICD-10-CM | POA: Diagnosis not present

## 2021-06-14 DIAGNOSIS — Y9301 Activity, walking, marching and hiking: Secondary | ICD-10-CM | POA: Diagnosis not present

## 2021-06-14 DIAGNOSIS — S99912A Unspecified injury of left ankle, initial encounter: Secondary | ICD-10-CM | POA: Diagnosis not present

## 2021-06-14 DIAGNOSIS — N760 Acute vaginitis: Secondary | ICD-10-CM | POA: Diagnosis not present

## 2021-06-14 DIAGNOSIS — Z5321 Procedure and treatment not carried out due to patient leaving prior to being seen by health care provider: Secondary | ICD-10-CM | POA: Diagnosis not present

## 2021-06-14 LAB — POCT URINALYSIS DIPSTICK, ED / UC
Bilirubin Urine: NEGATIVE
Glucose, UA: NEGATIVE mg/dL
Hgb urine dipstick: NEGATIVE
Nitrite: NEGATIVE
Protein, ur: NEGATIVE mg/dL
Specific Gravity, Urine: 1.025 (ref 1.005–1.030)
Urobilinogen, UA: 0.2 mg/dL (ref 0.0–1.0)
pH: 5 (ref 5.0–8.0)

## 2021-06-14 MED ORDER — BORIC ACID VAGINAL 600 MG VA SUPP: 1.0000 | Freq: Every evening | VAGINAL | 0 refills | Status: AC

## 2021-06-14 MED ORDER — IBUPROFEN 600 MG PO TABS: 600.0000 mg | ORAL_TABLET | Freq: Three times a day (TID) | ORAL | 0 refills | Status: DC | PRN

## 2021-06-14 NOTE — ED Triage Notes (Signed)
Pt presents with left ankle pain from a fall on some stairs X 2 nights ago; pt has some pain and swelling, seen in ED.  Pt also complains of recurring vaginal odor

## 2021-06-14 NOTE — ED Provider Notes (Signed)
[place]    CSN: [id number] Arrival date & time: 06/14/21  1711      History   Chief Complaint Chief Complaint  Patient presents with   Vaginitis   Ankle Pain    HPI Melanie Cisneros is a 31 y.o. female comes to the urgent care with left ankle pain which occurred 2 nights ago.  Patient twisted her ankle when she fell on a flight of stairs.  She did not hit her head or lose consciousness.  Patient describes the pain as sharp, of moderate severity, aggravated by bearing weight and denies any known relieving factors.  She has elevated the leg and applied ice with no improvement in symptoms.  Patient went to the emergency room where she had an x-ray of the left ankle completed.  She left the emergency room before she was evaluated by a provider.  Patient has recurrent bacterial vaginosis.  Patient has clear vaginal discharge with odor.  No abdominal pain.  No dysuria urgency or frequency.  She has tried metronidazole in the past.  HPI  Past Medical History:  Diagnosis Date   Bacterial vaginosis    Chlamydia    Gonorrhea    Obesity    UTI (lower urinary tract infection)     Patient Active Problem List   Diagnosis Date Noted   Bacterial vaginosis 05/13/2020   BMI 45.0-49.9, adult ([place]) 06/06/2016   Psychosocial stressors 02/25/2016   Ptyalism 02/25/2016   Rubella non-immune status, antepartum 01/27/2016    Past Surgical History:  Procedure Laterality Date   WISDOM TOOTH EXTRACTION      OB History     Gravida  1   Para  1   Term  1   Preterm      AB      Living  1      SAB      IAB      Ectopic      Multiple  0   Live Births  1            Home Medications    Prior to Admission medications   Medication Sig Start Date End Date Taking? Authorizing Provider  Boric Acid Vaginal 600 MG SUPP Place 1 suppository vaginally at bedtime for 20 days. 06/14/21 07/04/21 Yes Pegeen Stiger, Myrene Galas, MD  ibuprofen (ADVIL) 600 MG tablet Take 1 tablet  (600 mg total) by mouth every 8 (eight) hours as needed. 06/14/21  Yes Nicholson Starace, Myrene Galas, MD  diclofenac (VOLTAREN) 50 MG EC tablet Take 1 tablet (50 mg total) by mouth 2 (two) times daily. 03/26/21   Scot Jun, FNP  albuterol (VENTOLIN HFA) 108 (90 Base) MCG/ACT inhaler Inhale 1-2 puffs into the lungs every 6 (six) hours as needed for wheezing or shortness of breath. Patient not taking: Reported on 04/14/2020 10/04/19 04/27/20  [name], FNP    Family History Family History  Problem Relation Age of Onset   Hypertension Mother    Healthy Father     Social History Social History   Tobacco Use   Smoking status: Never   Smokeless tobacco: Never  Vaping Use   Vaping Use: Never used  Substance Use Topics   Alcohol use: Yes    Comment: socially   Drug use: No     Allergies   Amoxicillin and Penicillins   Review of Systems Review of Systems  Constitutional: Negative.   Respiratory: Negative.    Gastrointestinal: Negative.   Genitourinary:  Positive  for vaginal discharge. Negative for dysuria, frequency, urgency and vaginal pain.  Musculoskeletal:  Positive for arthralgias and joint swelling. Negative for back pain, gait problem, myalgias, neck pain and neck stiffness.    Physical Exam Triage Vital Signs ED Triage Vitals  Enc Vitals Group     BP 06/14/21 1827 138/85     Pulse Rate 06/14/21 1827 79     Resp 06/14/21 1827 18     Temp 06/14/21 1827 98.6 F (37 C)     Temp Source 06/14/21 1827 Oral     SpO2 06/14/21 1827 94 %     Weight --      Height --      Head Circumference --      Peak Flow --      Pain Score 06/14/21 1831 6     Pain Loc --      Pain Edu? --      Excl. in [place]? --    No data found.  Updated Vital Signs BP 138/85 (BP Location: Left Arm)    Pulse 79    Temp 98.6 F (37 C) (Oral)    Resp 18    LMP 06/07/2021    SpO2 94%   Visual Acuity Right Eye Distance:   Left Eye Distance:   Bilateral Distance:    Right Eye Near:   Left  Eye Near:    Bilateral Near:     Physical Exam Vitals and nursing note reviewed.  Constitutional:      General: She is not in acute distress.    Appearance: She is not ill-appearing.  Cardiovascular:     Rate and Rhythm: Normal rate and regular rhythm.     Pulses: Normal pulses.  Pulmonary:     Effort: Pulmonary effort is normal.     Breath sounds: Normal breath sounds.  Musculoskeletal:     Comments: Swelling over the lateral malleolus.  Full range of motion.  No bruising noted.  Neurological:     Mental Status: She is alert.     UC Treatments / Results  Labs (all labs ordered are listed, but only abnormal results are displayed) Labs Reviewed  POCT URINALYSIS DIPSTICK, ED / UC - Abnormal; Notable for the following components:      Result Value   Ketones, ur TRACE (*)    Leukocytes,Ua SMALL (*)    All other components within normal limits    EKG   Radiology DG Ankle Complete Left  Result Date: 06/14/2021 CLINICAL DATA:  Left ankle injury 2 days ago, swelling EXAM: LEFT FOOT - COMPLETE 3+ VIEW; LEFT ANKLE COMPLETE - 3+ VIEW COMPARISON:  07/30/2020 FINDINGS: Left ankle: Frontal, oblique, lateral views are obtained. No acute fracture, subluxation, or dislocation. Joint spaces are well preserved. There is diffuse soft tissue swelling. Left foot: Frontal, oblique, and lateral views are obtained. Either resection or resorption of the fifth middle phalanx, a stable finding. No acute fracture, subluxation, or dislocation. Joint spaces are well preserved. Small inferior calcaneal spur. Soft tissue swelling of the ankle and hindfoot. IMPRESSION: 1. Soft tissue swelling of the ankle and hindfoot. 2. No acute fracture. Electronically Signed   By: Randa Ngo M.D.   On: 06/14/2021 15:35   DG Foot Complete Left  Result Date: 06/14/2021 CLINICAL DATA:  Left ankle injury 2 days ago, swelling EXAM: LEFT FOOT - COMPLETE 3+ VIEW; LEFT ANKLE COMPLETE - 3+ VIEW COMPARISON:  07/30/2020  FINDINGS: Left ankle: Frontal, oblique, lateral views are obtained. No acute  fracture, subluxation, or dislocation. Joint spaces are well preserved. There is diffuse soft tissue swelling. Left foot: Frontal, oblique, and lateral views are obtained. Either resection or resorption of the fifth middle phalanx, a stable finding. No acute fracture, subluxation, or dislocation. Joint spaces are well preserved. Small inferior calcaneal spur. Soft tissue swelling of the ankle and hindfoot. IMPRESSION: 1. Soft tissue swelling of the ankle and hindfoot. 2. No acute fracture. Electronically Signed   By: Randa Ngo M.D.   On: 06/14/2021 15:35    Procedures Procedures (including critical care time)  Medications Ordered in UC Medications - No data to display  Initial Impression / Assessment and Plan / UC Course  I have reviewed the triage vital signs and the nursing notes.  Pertinent labs & imaging results that were available during my care of the patient were reviewed by me and considered in my medical decision making (see chart for details).     1.  Moderate left ankle sprain: Gentle range of motion Elevation Continue icing NSAID use If symptoms persist please follow-up with orthopedic surgery Ace wrap of the ankle is recommended  2.  Bacterial vaginosis: Boric acid vaginal suppositories recommended If patient's symptoms persist you may need to follow-up with a gynecologist for further management of recurrent bacterial vaginosis. Final Clinical Impressions(s) / UC Diagnoses   Final diagnoses:  Moderate left ankle sprain, initial encounter  BV (bacterial vaginosis)     Discharge Instructions      Please use medications as prescribed Your urine is negative for urinary tract infection Elevation of the left ankle Icing of the left ankle Take anti-inflammatory agents as prescribed If you have persistent pain and/or swelling-follow-up with the [facility]. Ace wrap of the left  ankle is recommended.   ED Prescriptions     Medication Sig Dispense Auth. Provider   ibuprofen (ADVIL) 600 MG tablet Take 1 tablet (600 mg total) by mouth every 8 (eight) hours as needed. 30 tablet Lorrane Mccay, Myrene Galas, MD   Boric Acid Vaginal 600 MG SUPP Place 1 suppository vaginally at bedtime for 20 days. 20 suppository Freman Lapage, Myrene Galas, MD      PDMP not reviewed this encounter.   Chase Picket, MD 06/14/21 2001

## 2021-06-14 NOTE — ED Provider Triage Note (Signed)
Emergency Medicine Provider Triage Evaluation Note  Melanie Cisneros , a 31 y.o. female  was evaluated in triage.  Pt complains of left ankle and foot pain.  Patient states that she was walking down some stairs on Saturday, rolled her left ankle and fell.  She initially rested it, and said the swelling went down over the weekend.  She said today at work as she was walking around it became more swollen and painful.  Review of Systems  Positive: Left ankle and foot pain Negative: Numbness, tingling  Physical Exam  BP 126/82 (BP Location: Right Arm)    Pulse 94    Temp 97.7 F (36.5 C) (Oral)    Resp 18    Ht 5\' 5"  (1.651 m)    Wt 122 kg    LMP 06/07/2021    SpO2 94%    BMI 44.76 kg/m  Gen:   Awake, no distress   Resp:  Normal effort  MSK:   Moves extremities without difficulty  Other:  Can wiggle all toes, good cap refill  Medical Decision Making  Medically screening exam initiated at 2:49 PM.  Appropriate orders placed.  Zulma D Eustice was informed that the remainder of the evaluation will be completed by another provider, this initial triage assessment does not replace that evaluation, and the importance of remaining in the ED until their evaluation is complete.  Will obtain x-rays   Hibba Schram T, PA-C 06/14/21 1453

## 2021-06-14 NOTE — Discharge Instructions (Addendum)
Please use medications as prescribed Your urine is negative for urinary tract infection Elevation of the left ankle Icing of the left ankle Take anti-inflammatory agents as prescribed If you have persistent pain and/or swelling-follow-up with the [facility]. Ace wrap of the left ankle is recommended.

## 2021-06-14 NOTE — ED Notes (Signed)
Pt on phone while walking to treatment room. Pt asked this RN when we entered treatment to obtain triage if she could finish her phone call. This RN informed patient to come out and let staff know when off phone.

## 2021-06-14 NOTE — ED Triage Notes (Signed)
Pt states she was walking down some stairs on Saturday and rolled her left ankle then fell. Pt rested and the swelling went down some over the weekend. But today at work as pt was walking around, it became more swollen and painful.

## 2021-06-15 ENCOUNTER — Telehealth: Payer: Self-pay

## 2021-06-15 NOTE — Telephone Encounter (Signed)
Telephone encounter was:  Successful.  06/15/2021 Name: Melanie Cisneros MRN: [id number] DOB: 10-28-1990  Melanie Cisneros is a 31 y.o. year old female who is a primary care patient of Patient, No Pcp Per (Inactive) . The community resource team was consulted for assistance with  PCP Search  Care guide performed the following interventions:  Patient's PCP appointment scheduled.  She advised at this time, she does not need any further assistance.  Follow Up Plan:  No further follow up planned at this time. The patient has been provided with needed resources.  [facility] Guide, Embedded Care Coordination [facility]  [place], [place] [address]  Main Phone: [phone]   E-mail: [email or web address]  Website: [email or web address]

## 2021-06-15 NOTE — Telephone Encounter (Signed)
Transition Care Management Follow-up Telephone Call Date of discharge and from where: 06/14/2021 from [facility] How have you been since you were released from the hospital? Pt stated that she is feeling better and did not have any questions.  Any questions or concerns? No  Items Reviewed: Did the pt receive and understand the discharge instructions provided? Yes  Medications obtained and verified? Yes  Other? No  Any new allergies since your discharge? No  Dietary orders reviewed? No Do you have support at home? Yes   Functional Questionnaire: (I = Independent and D = Dependent) ADLs: I Bathing/Dressing- I Meal Prep- I Eating- I Maintaining continence- I Transferring/Ambulation- I Managing Meds- I   Follow up appointments reviewed: PCP Hospital f/u appt confirmed? No  Pt is interested in establishing with PCP. Information given for [facility], [facility], [facility] and [place].  [facility] f/u appt confirmed? No   Are transportation arrangements needed? No  If their condition worsens, is the pt aware to call PCP or go to the Emergency Dept.? Yes Was the patient provided with contact information for the PCP's office or ED? Yes Was to pt encouraged to call back with questions or concerns? Yes

## 2021-06-18 ENCOUNTER — Ambulatory Visit: Payer: Medicaid Other | Admitting: Family

## 2021-08-05 ENCOUNTER — Other Ambulatory Visit: Payer: Self-pay

## 2021-08-05 ENCOUNTER — Encounter (INDEPENDENT_AMBULATORY_CARE_PROVIDER_SITE_OTHER): Payer: Self-pay | Admitting: Primary Care

## 2021-08-05 ENCOUNTER — Ambulatory Visit (INDEPENDENT_AMBULATORY_CARE_PROVIDER_SITE_OTHER): Payer: Medicaid Other | Admitting: Primary Care

## 2021-08-05 VITALS — BP 133/80 | HR 67 | Temp 97.7°F | Ht 65.0 in | Wt 293.2 lb

## 2021-08-05 DIAGNOSIS — B9689 Other specified bacterial agents as the cause of diseases classified elsewhere: Secondary | ICD-10-CM

## 2021-08-05 DIAGNOSIS — G8929 Other chronic pain: Secondary | ICD-10-CM | POA: Diagnosis not present

## 2021-08-05 DIAGNOSIS — N760 Acute vaginitis: Secondary | ICD-10-CM | POA: Diagnosis not present

## 2021-08-05 DIAGNOSIS — Z6841 Body Mass Index (BMI) 40.0 and over, adult: Secondary | ICD-10-CM

## 2021-08-05 DIAGNOSIS — Z7689 Persons encountering health services in other specified circumstances: Secondary | ICD-10-CM

## 2021-08-05 DIAGNOSIS — M549 Dorsalgia, unspecified: Secondary | ICD-10-CM | POA: Diagnosis not present

## 2021-08-05 NOTE — Progress Notes (Signed)
? ?Established Patient Office Visit ? ?Subjective:  ?Patient ID: Melanie Cisneros, female    DOB: [birth year]  Age: 31 y.o. MRN: [id number] ? ?CC:  ?Chief Complaint  ?Patient presents with  ? New Patient (Initial Visit)  ? ? ?HPI ?Melanie Cisneros presents for establishment of care . She voices concern with bacterial vaginosis reoccurrence at this time tx is sorbourric acid prescribed by Urgent care and at this time it is working.She is also concern with ongoing mid back pain and when she takes her bra off her posture leans forwarded. Today, on the exam table her posture is leaning forward and when asked to sit up her back starting hurting more. ? ?Past Medical History:  ?Diagnosis Date  ? Bacterial vaginosis   ? Chlamydia   ? Gonorrhea   ? Obesity   ? UTI (lower urinary tract infection)   ? ? ?Past Surgical History:  ?Procedure Laterality Date  ? WISDOM TOOTH EXTRACTION    ? ? ?Family History  ?Problem Relation Age of Onset  ? Hypertension Mother   ? Healthy Father   ? ? ?Social History  ? ?Socioeconomic History  ? Marital status: Single  ?  Spouse name: Not on file  ? Number of children: Not on file  ? Years of education: Not on file  ? Highest education level: Not on file  ?Occupational History  ? Not on file  ?Tobacco Use  ? Smoking status: Never  ? Smokeless tobacco: Never  ?Vaping Use  ? Vaping Use: Never used  ?Substance and Sexual Activity  ? Alcohol use: Yes  ?  Comment: socially  ? Drug use: No  ? Sexual activity: Yes  ?  Birth control/protection: None  ?Other Topics Concern  ? Not on file  ?Social History Narrative  ? Not on file  ? ?Social Determinants of Health  ? ?Financial Resource Strain: Not on file  ?Food Insecurity: Not on file  ?Transportation Needs: Not on file  ?Physical Activity: Not on file  ?Stress: Not on file  ?Social Connections: Not on file  ?Intimate Partner Violence: Not on file  ? ? ?Outpatient Medications Prior to Visit  ?Medication Sig Dispense Refill  ? AZO BORIC ACID 600 MG SUPP  Take 600 mg by mouth.    ? diclofenac (VOLTAREN) 50 MG EC tablet Take 1 tablet (50 mg total) by mouth 2 (two) times daily. 20 tablet 0  ? ibuprofen (ADVIL) 600 MG tablet Take 1 tablet (600 mg total) by mouth every 8 (eight) hours as needed. 30 tablet 0  ? ?No facility-administered medications prior to visit.  ? ? ?Allergies  ?Allergen Reactions  ? Amoxicillin Anaphylaxis, Hives and Other (See Comments)  ?  Has patient had a PCN reaction causing immediate rash, facial/tongue/throat swelling, SOB or lightheadedness with hypotension: Yes ?Has patient had a PCN reaction causing severe rash involving mucus membranes or skin necrosis: No ?Has patient had a PCN reaction that required hospitalization No ?Has patient had a PCN reaction occurring within the last 10 years: No ?If all of the above answers are "NO", then may proceed with Cephalosporin use.  ? Penicillins Anaphylaxis, Hives and Other (See Comments)  ?  Has patient had a PCN reaction causing immediate rash, facial/tongue/throat swelling, SOB or lightheadedness with hypotension: Yes ?Has patient had a PCN reaction causing severe rash involving mucus membranes or skin necrosis: No ?Has patient had a PCN reaction that required hospitalization No ?Has patient had a PCN reaction occurring within the  last 10 years: No ?If all of the above answers are "NO", then may proceed with Cephalosporin use.  ? ? ?ROS ?Comprehensive ROS Pertinent positive and negative noted in HPI   ? ?  ?Objective:  ?  ?Physical Exam ?Vitals reviewed.  ?Constitutional:   ?   Comments: Morbid obesity   ?HENT:  ?   Head: Normocephalic.  ?   Right Ear: Tympanic membrane and external ear normal.  ?   Left Ear: Tympanic membrane and external ear normal.  ?   Nose: Nose normal.  ?Eyes:  ?   Extraocular Movements: Extraocular movements intact.  ?   Pupils: Pupils are equal, round, and reactive to light.  ?Cardiovascular:  ?   Rate and Rhythm: Normal rate and regular rhythm.  ?Pulmonary:  ?   Effort:  Pulmonary effort is normal.  ?   Breath sounds: Normal breath sounds.  ?Abdominal:  ?   General: Bowel sounds are normal. There is distension.  ?   Palpations: Abdomen is soft.  ?Musculoskeletal:     ?   General: Normal range of motion.  ?   Cervical back: Normal range of motion and neck supple.  ?Skin: ?   General: Skin is warm and dry.  ?Neurological:  ?   Mental Status: She is alert and oriented to person, place, and time.  ?Psychiatric:     ?   Mood and Affect: Mood normal.     ?   Behavior: Behavior normal.     ?   Thought Content: Thought content normal.     ?   Judgment: Judgment normal.  ? ? ?BP 133/80 (BP Location: Right Arm, Patient Position: Sitting, Cuff Size: Large)   Pulse 67   Temp 97.7 ?F (36.5 ?C) (Oral)   Ht '5\' 5"'$  (1.651 m)   Wt 293 lb 3.2 oz (133 kg)   LMP 08/04/2021 (Exact Date)   SpO2 96%   BMI 48.79 kg/m?  ?Wt Readings from Last 3 Encounters:  ?08/05/21 293 lb 3.2 oz (133 kg)  ?06/14/21 269 lb (122 kg)  ?02/18/21 280 lb 8 oz (127.2 kg)  ? ? ? ?Health Maintenance Due  ?Topic Date Due  ? COVID-19 Vaccine (1) Never done  ? ? ?There are no preventive care reminders to display for this patient. ? ?No results found for: TSH ?Lab Results  ?Component Value Date  ? WBC 18.2 (H) 05/06/2021  ? HGB 13.4 05/06/2021  ? HCT 40.2 05/06/2021  ? MCV 82.7 05/06/2021  ? PLT 297 05/06/2021  ? ?Lab Results  ?Component Value Date  ? NA 139 05/06/2021  ? K 3.6 05/06/2021  ? CO2 23 05/06/2021  ? GLUCOSE 100 (H) 05/06/2021  ? BUN 9 05/06/2021  ? CREATININE 0.87 05/06/2021  ? BILITOT 0.9 01/12/2019  ? ALKPHOS 87 01/12/2019  ? AST 17 01/12/2019  ? ALT 18 01/12/2019  ? PROT 6.6 01/12/2019  ? ALBUMIN 3.6 01/12/2019  ? CALCIUM 8.9 05/06/2021  ? ANIONGAP 8 05/06/2021  ? ?No results found for: CHOL ?No results found for: HDL ?No results found for: [place] ?No results found for: TRIG ?No results found for: CHOLHDL ?No results found for: HGBA1C ? ?  ?Assessment & Plan:  ?Melanie Cisneros was seen today for new patient (initial  visit). ? ?Diagnoses and all orders for this visit: ? ?Bacterial vaginosis ?Resolving treated at urgent care ? ?Chronic mid back pain ?Morbid obesity and Macromastia are underlying causes.  Encourage to exercise and lose weight, healthy diet discussed  better food options ? ?Encounter to establish care ?Establish care ? ?BMI 45.0-49.9, adult ([place]) ?Morbid obesity is greater than 40 BMI indicating an excess in caloric intake or underlining conditions. This may lead to other co-morbidities. Lifestyle modifications of diet and exercise may reduce obesity.   ?-     Lipid panel; Future ? ?  ? ? ?Follow-up: Return if symptoms worsen or fail to improve.  ? ? ?Kerin Perna, NP ?

## 2021-08-05 NOTE — Progress Notes (Signed)
Wanted to discuss ph balance concerns. She is very sensitive in her private area and states anything causes her ph balance to be off. She was given boric acid supplements to take and wanted to know if they are good for pH ?States she has some tenderness in breasts at times other than near menstrual  ?

## 2021-08-05 NOTE — Patient Instructions (Signed)
Chronic Back Pain When back pain lasts longer than 3 months, it is called chronic back pain. Pain may get worse at certain times (flare-ups). There are things you can do at home to manage your pain. Follow these instructions at home: Pay attention to any changes in your symptoms. Take these actions to help with your pain: Managing pain and stiffness   If told, put ice on the painful area. Your doctor may tell you to use ice for 24-48 hours after the flare-up starts. To do this: Put ice in a plastic bag. Place a towel between your skin and the bag. Leave the ice on for 20 minutes, 2-3 times a day. If told, put heat on the painful area. Do this as often as told by your doctor. Use the heat source that your doctor recommends, such as a moist heat pack or a heating pad. Place a towel between your skin and the heat source. Leave the heat on for 20-30 minutes. Take off the heat if your skin turns bright red. This is especially important if you are unable to feel pain, heat, or cold. You may have a greater risk of getting burned. Soak in a warm bath. This can help relieve pain. Activity  Avoid bending and other activities that make pain worse. When standing: Keep your upper back and neck straight. Keep your shoulders pulled back. Avoid slouching. When sitting: Keep your back straight. Relax your shoulders. Do not round your shoulders or pull them backward. Do not sit or stand in one place for long periods of time. Take short rest breaks during the day. Lying down or standing is usually better than sitting. Resting can help relieve pain. When sitting or lying down for a long time, do some mild activity or stretching. This will help to prevent stiffness and pain. Get regular exercise. Ask your doctor what activities are safe for you. Do not lift anything that is heavier than 10 lb (4.5 kg) or the limit that you are told, until your doctor says that it is safe. To prevent injury when you lift  things: Bend your knees. Keep the weight close to your body. Avoid twisting. Sleep on a firm mattress. Try lying on your side with your knees slightly bent. If you lie on your back, put a pillow under your knees. Medicines Treatment may include medicines for pain and swelling taken by mouth or put on the skin, prescription pain medicine, or muscle relaxants. Take over-the-counter and prescription medicines only as told by your doctor. Ask your doctor if the medicine prescribed to you: Requires you to avoid driving or using machinery. Can cause trouble pooping (constipation). You may need to take these actions to prevent or treat trouble pooping: Drink enough fluid to keep your pee (urine) pale yellow. Take over-the-counter or prescription medicines. Eat foods that are high in fiber. These include beans, whole grains, and fresh fruits and vegetables. Limit foods that are high in fat and sugars. These include fried or sweet foods. General instructions Do not use any products that contain nicotine or tobacco, such as cigarettes, e-cigarettes, and chewing tobacco. If you need help quitting, ask your doctor. Keep all follow-up visits as told by your doctor. This is important. Contact a doctor if: Your pain does not get better with rest or medicine. Your pain gets worse, or you have new pain. You have a high fever. You lose weight very quickly. You have trouble doing your normal activities. Get help right away if: One   or both of your legs or feet feel weak. One or both of your legs or feet lose feeling (have numbness). You have trouble controlling when you poop (have a bowel movement) or pee (urinate). You have bad back pain and: You feel like you may vomit (nauseous), or you vomit. You have pain in your belly (abdomen). You have shortness of breath. You faint. Summary When back pain lasts longer than 3 months, it is called chronic back pain. Pain may get worse at certain times  (flare-ups). Use ice and heat as told by your doctor. Your doctor may tell you to use ice after flare-ups. This information is not intended to replace advice given to you by your health care provider. Make sure you discuss any questions you have with your health care provider. Document Revised: 06/12/2019 Document Reviewed: 06/12/2019 Elsevier Patient Education  2022 [employer]  

## 2021-08-27 DIAGNOSIS — B3732 Chronic candidiasis of vulva and vagina: Secondary | ICD-10-CM | POA: Diagnosis not present

## 2021-08-27 DIAGNOSIS — N898 Other specified noninflammatory disorders of vagina: Secondary | ICD-10-CM | POA: Diagnosis not present

## 2021-09-06 ENCOUNTER — Encounter (HOSPITAL_COMMUNITY): Payer: Self-pay

## 2021-09-06 ENCOUNTER — Ambulatory Visit (HOSPITAL_COMMUNITY)
Admission: RE | Admit: 2021-09-06 | Discharge: 2021-09-06 | Disposition: A | Discharge status: Home / Self Care | Payer: Medicaid Other | Source: Ambulatory Visit | Attending: Emergency Medicine | Admitting: Emergency Medicine

## 2021-09-06 VITALS — BP 115/79 | HR 85 | Temp 99.0°F | Resp 17

## 2021-09-06 DIAGNOSIS — J020 Streptococcal pharyngitis: Secondary | ICD-10-CM

## 2021-09-06 LAB — POCT RAPID STREP A, ED / UC: Streptococcus, Group A Screen (Direct): POSITIVE — AB

## 2021-09-06 MED ORDER — FLUCONAZOLE 150 MG PO TABS: 150.0000 mg | ORAL_TABLET | Freq: Every day | ORAL | 0 refills | Status: AC

## 2021-09-06 MED ORDER — CLINDAMYCIN HCL 300 MG PO CAPS: 300.0000 mg | ORAL_CAPSULE | Freq: Three times a day (TID) | ORAL | 0 refills | Status: AC

## 2021-09-06 NOTE — ED Triage Notes (Signed)
Pt is present today with a sore throat, HA, left earache . Pt sx started Saturday ?

## 2021-09-06 NOTE — ED Provider Notes (Signed)
?[facility] ? ? ? ?CSN: [id number] ?Arrival date & time: 09/06/21  1046 ? ? ?  ? ?History   ?Chief Complaint ?Chief Complaint  ?Patient presents with  ? Sore Throat  ?  Throat hurts very bad has Melanie Cisneros spots on it and is inflamed, coughing, spitting a lot, runny nose, head aches here and there - Entered by patient  ? ? ?HPI ?Melanie Cisneros is a 31 y.o. female.  ? ?Patient presents with sore throat, left-sided ear pain, generalized headache for 2 days.  Painful to swallow, decreased appetite but tolerating some fluids.  Melanie Cisneros patches are notated to the back of throat which patient has attempted to remove.  No known sick contacts.  Denies fever, chills, body aches, congestion, coughing, shortness of breath or wheezing.   ? ?Past Medical History:  ?Diagnosis Date  ? Bacterial vaginosis   ? Chlamydia   ? Gonorrhea   ? Obesity   ? UTI (lower urinary tract infection)   ? ? ?Patient Active Problem List  ? Diagnosis Date Noted  ? Bacterial vaginosis 05/13/2020  ? BMI 45.0-49.9, adult ([place]) 06/06/2016  ? Psychosocial stressors 02/25/2016  ? 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 Rubella non-immune status, antepartum 01/27/2016  ? ? ?Past Surgical History:  ?Procedure Laterality Date  ? WISDOM TOOTH EXTRACTION    ? ? ?OB History   ? ? Gravida  ?1  ? Para  ?1  ? Term  ?1  ? Preterm  ?   ? AB  ?   ? Living  ?1  ?  ? ? SAB  ?   ? IAB  ?   ? Ectopic  ?   ? Multiple  ?0  ? Live Births  ?1  ?   ?  ?  ? ? ? ?Home Medications   ? ?Prior to Admission medications   ?Medication Sig Start Date End Date Taking? Authorizing Provider  ?AZO BORIC ACID 600 MG SUPP Take 600 mg by mouth. 07/05/21   [provider]  ?albuterol (VENTOLIN HFA) 108 (90 Base) MCG/ACT inhaler Inhale 1-2 puffs into the lungs every 6 (six) hours as needed for wheezing or shortness of breath. ?Patient not taking: Reported on 04/14/2020 10/04/19 04/27/20  [name], FNP  ? ? ?Family History ?Family History  ?Problem Relation Age of Onset  ? Hypertension  Mother   ? Healthy Father   ? ? ?Social History ?Social History  ? ?Tobacco Use  ? Smoking status: Never  ? Smokeless tobacco: Never  ?Vaping Use  ? Vaping Use: Never used  ?Substance Use Topics  ? Alcohol use: Yes  ?  Comment: socially  ? Drug use: No  ? ? ? ?Allergies   ?Amoxicillin and Penicillins ? ? ?Review of Systems ?Review of Systems  ?Constitutional: Negative.   ?HENT:  Positive for ear pain and sore throat. Negative for congestion, dental problem, drooling, ear discharge, facial swelling, hearing loss, mouth sores, nosebleeds, postnasal drip, rhinorrhea, sinus pressure, sinus pain, sneezing, tinnitus, trouble swallowing and voice change.   ?Respiratory: Negative.    ?Cardiovascular: Negative.   ?Gastrointestinal: Negative.   ?Skin: Negative.   ?Neurological:  Positive for headaches. Negative for dizziness, tremors, seizures, syncope, facial asymmetry, speech difficulty, weakness, light-headedness and numbness.  ? ? ?Physical Exam ?Triage Vital Signs ?ED Triage Vitals  ?Enc Vitals Group  ?   BP 09/06/21 1105 115/79  ?   Pulse Rate 09/06/21 1105 85  ?   Resp 09/06/21 1105 17  ?  Temp 09/06/21 1105 99 ?F (37.2 ?C)  ?   Temp src --   ?   SpO2 09/06/21 1105 95 %  ?   Weight --   ?   Height --   ?   Head Circumference --   ?   Peak Flow --   ?   Pain Score 09/06/21 1104 6  ?   Pain Loc --   ?   Pain Edu? --   ?   Excl. in [place]? --   ? ?No data found. ? ?Updated Vital Signs ?BP 115/79   Pulse 85   Temp 99 ?F (37.2 ?C)   Resp 17   SpO2 95%  ? ?Visual Acuity ?Right Eye Distance:   ?Left Eye Distance:   ?Bilateral Distance:   ? ?Right Eye Near:   ?Left Eye Near:    ?Bilateral Near:    ? ?Physical Exam ?Constitutional:   ?   Appearance: She is well-developed.  ?HENT:  ?   Head: Normocephalic.  ?   Right Ear: Tympanic membrane and ear canal normal.  ?   Left Ear: Tympanic membrane and ear canal normal.  ?   Nose: No congestion or rhinorrhea.  ?   Mouth/Throat:  ?   Mouth: Mucous membranes are moist.  ?   Pharynx:  No posterior oropharyngeal erythema.  ?   Tonsils: Tonsillar exudate present. 2+ on the right. 2+ on the left.  ?Cardiovascular:  ?   Rate and Rhythm: Normal rate and regular rhythm.  ?   Heart sounds: Normal heart sounds.  ?Pulmonary:  ?   Effort: Pulmonary effort is normal.  ?   Breath sounds: Normal breath sounds.  ?Musculoskeletal:  ?   Cervical back: Normal range of motion and neck supple.  ?Skin: ?   General: Skin is warm and dry.  ?Neurological:  ?   General: No focal deficit present.  ?   Mental Status: She is alert and oriented to person, place, and time.  ?Psychiatric:     ?   Mood and Affect: Mood normal.     ?   Behavior: Behavior normal.  ? ? ? ?UC Treatments / Results  ?Labs ?(all labs ordered are listed, but only abnormal results are displayed) ?Labs Reviewed  ?POCT RAPID STREP A, ED / UC - Abnormal; Notable for the following components:  ?    Result Value  ? Streptococcus, Group A Screen (Direct) POSITIVE (*)   ? All other components within normal limits  ? ? ?EKG ? ? ?Radiology ?No results found. ? ?Procedures ?Procedures (including critical care time) ? ?Medications Ordered in UC ?Medications - No data to display ? ?Initial Impression / Assessment and Plan / UC Course  ?I have reviewed the triage vital signs and the nursing notes. ? ?Pertinent labs & imaging results that were available during my care of the patient were reviewed by me and considered in my medical decision making (see chart for details). ? ?Strep pharyngitis ? ?Confirmed by rapid testing, discussed findings with patient, advised patient to not attempt to remove exudate and that it will resolve on its own, allergy to penicillin confirmed, clindamycin 3 times daily for 10 days prescribed, recommended continued use of ibuprofen or Tylenol for pain management, may attempt salt water gargles, throat lozenges, warm liquids soft foods and teaspoons of honey for additional supportive care, may follow-up with urgent care as needed, work note  given ?Final Clinical Impressions(s) / UC Diagnoses  ? ?Final diagnoses:  ?  None  ? ?Discharge Instructions   ?None ?  ? ?ED Prescriptions   ?None ?  ? ?PDMP not reviewed this encounter. ?  ?Hans Eden, NP ?09/06/21 1144 ? ?

## 2021-09-06 NOTE — Discharge Instructions (Signed)
Your rapid strep test today was positive ? ?Take amoxicillin twice a day for the next 10 days, you should begin to see improvement in symptoms in about 48 hours and steady progression from there ? ?You can continue use of ibuprofen and Tylenol for additional pain management ? ?You may attempt use of salt water gargles, throat lozenges, warm liquids and teaspoons of honey for additional support ? ?You may follow-up at urgent care as needed   ?

## 2021-09-25 ENCOUNTER — Ambulatory Visit (HOSPITAL_COMMUNITY)
Admission: RE | Admit: 2021-09-25 | Discharge: 2021-09-25 | Disposition: A | Discharge status: Home / Self Care | Payer: Medicaid Other | Source: Ambulatory Visit | Attending: Emergency Medicine | Admitting: Emergency Medicine

## 2021-09-25 ENCOUNTER — Encounter (HOSPITAL_COMMUNITY): Payer: Self-pay

## 2021-09-25 VITALS — BP 122/87 | HR 53 | Temp 98.9°F | Resp 16

## 2021-09-25 DIAGNOSIS — R102 Pelvic and perineal pain: Secondary | ICD-10-CM | POA: Diagnosis not present

## 2021-09-25 LAB — POCT URINALYSIS DIPSTICK, ED / UC
Bilirubin Urine: NEGATIVE
Glucose, UA: NEGATIVE mg/dL
Hgb urine dipstick: NEGATIVE
Ketones, ur: NEGATIVE mg/dL
Leukocytes,Ua: NEGATIVE
Nitrite: NEGATIVE
Protein, ur: NEGATIVE mg/dL
Specific Gravity, Urine: 1.02 (ref 1.005–1.030)
Urobilinogen, UA: 0.2 mg/dL (ref 0.0–1.0)
pH: 6 (ref 5.0–8.0)

## 2021-09-25 LAB — POC URINE PREG, ED: Preg Test, Ur: NEGATIVE

## 2021-09-25 NOTE — ED Triage Notes (Addendum)
Reports being contacted by sexual partner that their "pH was off" and believes pt gave him an infection; pt states just finished clindamycin, followed by diflucan, so doesn't believe she has infection. Pt started using leftover Rx of Metrogel yesterday. ?C/O intermittent suprapubic pains since yesterday. ?

## 2021-09-25 NOTE — Discharge Instructions (Signed)
Labs pending 2-3 days, you will be contacted if positive for any sti and treatment will be sent to the pharmacy, you will have to return to the clinic if positive for gonorrhea to receive treatment  ? ?As you have been using MetroGel it is a possibility that it may skew the testing, if we need you to come back for retesting we will call you ? ?Please refrain from having sex until labs results, if positive please refrain from having sex until treatment complete and symptoms resolve  ? ?If positive for Chlamydia  gonorrhea or trichomoniasis please notify partner or partners so they may tested as well ? ?Moving forward, it is recommended you use some form of protection against the transmission of sti infections  such as condoms or dental dams with each sexual encounter  ? ?

## 2021-09-25 NOTE — ED Provider Notes (Signed)
?[place] ? ? ? ?CSN: [id number] ?Arrival date & time: 09/25/21  1233 ? ? ?  ? ?History   ?Chief Complaint ?Chief Complaint  ?Patient presents with  ? Abdominal Pain  ? Appointment  ?  1245  ? ? ?HPI ?Melanie Cisneros is a 31 y.o. female.  ? ?This with patient presents with suprapubic pelvic pain occurring once 1 day ago.  Requesting STD testing.  Endorses a new partner in which she did not use condoms with.  Partner has disclosed that he was told that his pH was imbalance by a medical provider.  Partner denied STD.  Patient denies urinary frequency, urgency, hematuria, dysuria, flank pain, fever, chills, vaginal discharge itching or odor.  Was recently treated for BV, completed medication and symptoms resolved.  Has been taking old prescription of MetroGel since symptoms began.  ?Past Medical History:  ?Diagnosis Date  ? Bacterial vaginosis   ? Chlamydia   ? Gonorrhea   ? Obesity   ? UTI (lower urinary tract infection)   ? ? ?Patient Active Problem List  ? Diagnosis Date Noted  ? Bacterial vaginosis 05/13/2020  ? BMI 45.0-49.9, adult ([place]) 06/06/2016  ? Psychosocial stressors 02/25/2016  ? 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 Rubella non-immune status, antepartum 01/27/2016  ? ? ?Past Surgical History:  ?Procedure Laterality Date  ? WISDOM TOOTH EXTRACTION    ? ? ?OB History   ? ? Gravida  ?1  ? Para  ?1  ? Term  ?1  ? Preterm  ?   ? AB  ?   ? Living  ?1  ?  ? ? SAB  ?   ? IAB  ?   ? Ectopic  ?   ? Multiple  ?0  ? Live Births  ?1  ?   ?  ?  ? ? ? ?Home Medications   ? ?Prior to Admission medications   ?Medication Sig Start Date End Date Taking? Authorizing Provider  ?metroNIDAZOLE (METROGEL) 0.75 % vaginal gel Place 1 Applicatorful vaginally 2 (two) times daily.   Yes [provider]  ?AZO BORIC ACID 600 MG SUPP Take 600 mg by mouth. 07/05/21   [provider]  ?albuterol (VENTOLIN HFA) 108 (90 Base) MCG/ACT inhaler Inhale 1-2 puffs into the lungs every 6 (six) hours as needed for wheezing or  shortness of breath. ?Patient not taking: Reported on 04/14/2020 10/04/19 04/27/20  [name], FNP  ? ? ?Family History ?Family History  ?Problem Relation Age of Onset  ? Hypertension Mother   ? Healthy Father   ? ? ?Social History ?Social History  ? ?Tobacco Use  ? Smoking status: Never  ? Smokeless tobacco: Never  ?Vaping Use  ? Vaping Use: Never used  ?Substance Use Topics  ? Alcohol use: Yes  ?  Comment: socially  ? Drug use: No  ? ? ? ?Allergies   ?Amoxicillin and Penicillins ? ? ?Review of Systems ?Review of Systems  ?Constitutional: Negative.   ?Respiratory: Negative.    ?Cardiovascular: Negative.   ?Gastrointestinal:  Positive for abdominal pain. Negative for abdominal distention, anal bleeding, blood in stool, constipation, diarrhea, nausea, rectal pain and vomiting.  ?Skin: Negative.   ? ? ?Physical Exam ?Triage Vital Signs ?ED Triage Vitals  ?Enc Vitals Group  ?   BP 09/25/21 1258 122/87  ?   Pulse Rate 09/25/21 1258 (!) 53  ?   Resp 09/25/21 1258 16  ?   Temp 09/25/21 1258 98.9 ?F (37.2 ?C)  ?  Temp Source 09/25/21 1258 Oral  ?   SpO2 09/25/21 1258 100 %  ?   Weight --   ?   Height --   ?   Head Circumference --   ?   Peak Flow --   ?   Pain Score 09/25/21 1301 0  ?   Pain Loc --   ?   Pain Edu? --   ?   Excl. in [place]? --   ? ?No data found. ? ?Updated Vital Signs ?BP 122/87   Pulse (!) 53   Temp 98.9 ?F (37.2 ?C) (Oral)   Resp 16   LMP 08/12/2021 (Approximate)   SpO2 100%  ? ?Visual Acuity ?Right Eye Distance:   ?Left Eye Distance:   ?Bilateral Distance:   ? ?Right Eye Near:   ?Left Eye Near:    ?Bilateral Near:    ? ?Physical Exam ?Constitutional:   ?   Appearance: Normal appearance.  ?HENT:  ?   Head: Normocephalic.  ?Eyes:  ?   Extraocular Movements: Extraocular movements intact.  ?Pulmonary:  ?   Effort: Pulmonary effort is normal.  ?Abdominal:  ?   General: Abdomen is flat. Bowel sounds are normal.  ?   Palpations: Abdomen is soft.  ?   Tenderness: There is no abdominal tenderness.   ?Genitourinary: ?   Comments: Deferred, self collect vaginal swab ?Neurological:  ?   Mental Status: She is alert and oriented to person, place, and time. Mental status is at baseline.  ?Psychiatric:     ?   Mood and Affect: Mood normal.     ?   Behavior: Behavior normal.  ? ? ? ?UC Treatments / Results  ?Labs ?(all labs ordered are listed, but only abnormal results are displayed) ?Labs Reviewed  ?POC URINE PREG, ED  ?POCT URINALYSIS DIPSTICK, ED / UC  ?CERVICOVAGINAL ANCILLARY ONLY  ? ? ?EKG ? ? ?Radiology ?No results found. ? ?Procedures ?Procedures (including critical care time) ? ?Medications Ordered in UC ?Medications - No data to display ? ?Initial Impression / Assessment and Plan / UC Course  ?I have reviewed the triage vital signs and the nursing notes. ? ?Pertinent labs & imaging results that were available during my care of the patient were reviewed by me and considered in my medical decision making (see chart for details). ? ?Suprapubic pain ? ?STI labs pending, discussed possible skewed results due to use of MetroGel today, will have patient retest as needed, advised abstinence until lab results, and/or treatment is complete, may follow-up with urgent care as needed, advised condom use during all sexual encounters moving forward ?Final Clinical Impressions(s) / UC Diagnoses  ? ?Final diagnoses:  ?Suprapubic pain  ? ?Discharge Instructions   ?None ?  ? ?ED Prescriptions   ?None ?  ? ?PDMP not reviewed this encounter. ?  ?Hans Eden, NP ?09/25/21 1357 ? ?

## 2021-09-27 LAB — CERVICOVAGINAL ANCILLARY ONLY
Bacterial Vaginitis (gardnerella): NEGATIVE
Candida Glabrata: NEGATIVE
Candida Vaginitis: NEGATIVE
Chlamydia: NEGATIVE
Comment: NEGATIVE
Comment: NEGATIVE
Comment: NEGATIVE
Comment: NEGATIVE
Comment: NEGATIVE
Comment: NORMAL
Neisseria Gonorrhea: NEGATIVE
Trichomonas: NEGATIVE

## 2021-10-08 ENCOUNTER — Ambulatory Visit (HOSPITAL_COMMUNITY): Payer: Self-pay

## 2021-10-10 ENCOUNTER — Ambulatory Visit (HOSPITAL_COMMUNITY): Payer: Self-pay

## 2021-10-11 ENCOUNTER — Ambulatory Visit (HOSPITAL_COMMUNITY): Payer: Self-pay

## 2021-10-20 ENCOUNTER — Ambulatory Visit (HOSPITAL_COMMUNITY)
Admission: RE | Admit: 2021-10-20 | Discharge: 2021-10-20 | Disposition: A | Discharge status: Home / Self Care | Payer: Medicaid Other | Source: Ambulatory Visit | Attending: Physician Assistant | Admitting: Physician Assistant

## 2021-10-20 ENCOUNTER — Ambulatory Visit: Payer: Medicaid Other | Admitting: Podiatry

## 2021-10-20 ENCOUNTER — Encounter (HOSPITAL_COMMUNITY): Payer: Self-pay

## 2021-10-20 ENCOUNTER — Ambulatory Visit (INDEPENDENT_AMBULATORY_CARE_PROVIDER_SITE_OTHER): Payer: Medicaid Other

## 2021-10-20 ENCOUNTER — Encounter: Payer: Self-pay | Admitting: Podiatry

## 2021-10-20 VITALS — BP 131/90 | HR 71 | Temp 97.9°F | Resp 18

## 2021-10-20 DIAGNOSIS — N898 Other specified noninflammatory disorders of vagina: Secondary | ICD-10-CM | POA: Insufficient documentation

## 2021-10-20 DIAGNOSIS — Z113 Encounter for screening for infections with a predominantly sexual mode of transmission: Secondary | ICD-10-CM | POA: Diagnosis not present

## 2021-10-20 DIAGNOSIS — M722 Plantar fascial fibromatosis: Secondary | ICD-10-CM

## 2021-10-20 DIAGNOSIS — R200 Anesthesia of skin: Secondary | ICD-10-CM | POA: Insufficient documentation

## 2021-10-20 DIAGNOSIS — R202 Paresthesia of skin: Secondary | ICD-10-CM | POA: Insufficient documentation

## 2021-10-20 DIAGNOSIS — N760 Acute vaginitis: Secondary | ICD-10-CM | POA: Diagnosis not present

## 2021-10-20 LAB — POCT URINALYSIS DIPSTICK, ED / UC
Bilirubin Urine: NEGATIVE
Glucose, UA: NEGATIVE mg/dL
Hgb urine dipstick: NEGATIVE
Ketones, ur: NEGATIVE mg/dL
Leukocytes,Ua: NEGATIVE
Nitrite: NEGATIVE
Protein, ur: NEGATIVE mg/dL
Specific Gravity, Urine: 1.015 (ref 1.005–1.030)
Urobilinogen, UA: 0.2 mg/dL (ref 0.0–1.0)
pH: 6 (ref 5.0–8.0)

## 2021-10-20 LAB — POC URINE PREG, ED: Preg Test, Ur: NEGATIVE

## 2021-10-20 LAB — HCG, QUANTITATIVE, PREGNANCY: hCG, Beta Chain, Quant, S: <1 m[IU]/mL (ref <5)

## 2021-10-20 LAB — HEPATITIS C ANTIBODY: HCV Ab: NONREACTIVE

## 2021-10-20 LAB — HIV ANTIBODY (ROUTINE TESTING W REFLEX): HIV Screen 4th Generation wRfx: NONREACTIVE

## 2021-10-20 MED ORDER — FLUCONAZOLE 150 MG PO TABS: 150.0000 mg | ORAL_TABLET | ORAL | 0 refills | Status: DC | PRN

## 2021-10-20 MED ORDER — TRIAMCINOLONE ACETONIDE 10 MG/ML IJ SUSP
10.0000 mg | Freq: Once | INTRAMUSCULAR | Status: AC
  Administered 2021-10-20: 10 mg

## 2021-10-20 MED ORDER — NAPROXEN 500 MG PO TABS: 500.0000 mg | ORAL_TABLET | Freq: Two times a day (BID) | ORAL | 0 refills | Status: DC

## 2021-10-20 NOTE — ED Triage Notes (Signed)
Pt is present today vaginal discharge and abdominal pain. Pt sx started x3 days ago

## 2021-10-20 NOTE — ED Provider Notes (Signed)
[place]    CSN: [id number] Arrival date & time: 10/20/21  1617      History   Chief Complaint Chief Complaint  Patient presents with   Abdominal Pain    I got tested a few weeks ago I believe I got tested too early. I'm having abdominal pain, slight ordor and irritation, also some discharge. I would also like my arm checked out, maybe a referral if you all provide them my arm keep going numb - Entered by patient    HPI Melanie Cisneros is a 31 y.o. female.   Patient presents today with several concerns.  Her primary concern today is vaginal itching and irritation.  Reports that she was seen here 09/25/2021 at which point she had some mild suprapubic pain.  She was concerned that she might have an STI and at that point swab was negative.  She is requesting repeat today.  She reports over the past several days that she has had some vaginal irritation and thick white discharge.  She had been using a leftover prescription of metronidazole gel as she has a history of recurrent bacterial vaginosis and initially thought that was causing symptoms.  She is sexually active with female partners and does report unprotected sex in the past several months.  She is interested in complete STI panel.  Denies any pelvic pain, abdominal pain, nausea, vomiting, fever.  She is requesting pregnancy testing.  She is requesting quantitative testing specifically as she has a history of irregular menstrual cycles with last menstrual cycle in April 2023 and often has a negative urine pregnancy test at first.  In addition, patient is requesting a referral to neurology.  She reports a several month history of intermittent right arm/hand numbness and tingling.  Last episode occurred earlier today when she woke up and then resolved prior to her appointment.  She has tried over-the-counter medications including Tylenol ibuprofen without improvement.  She denies any significant neck pain.  Denies any weakness or  numbness but does feel that her arm is more difficult to move when she is experiencing the significant symptoms.  She did discuss this with her PCP but referral was not placed to neurology.  She is requesting referral today.   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 Authorizing Provider  fluconazole (DIFLUCAN) 150 MG tablet Take 1 tablet (150 mg total) by mouth every 3 (three) days as needed. 10/20/21  Yes Lamere Lightner,  Brinna Divelbiss K, PA-C  naproxen (NAPROSYN) 500 MG tablet Take 1 tablet (500 mg total) by mouth 2 (two) times daily. 10/20/21  Yes Kewana Sanon K, PA-C  AZO BORIC ACID 600 MG SUPP Take 600 mg by mouth. 07/05/21   [provider]  albuterol (VENTOLIN HFA) 108 (90 Base) MCG/ACT inhaler Inhale 1-2 puffs into the lungs every 6 (six) hours as needed for wheezing or shortness of breath. Patient not taking: Reported on 04/14/2020 10/04/19 04/27/20  [name], FNP    Family History Family History  Problem Relation Age of Onset   Hypertension Mother    Healthy Father     Social History Social History   Tobacco Use   Smoking status: Never   Smokeless tobacco: Never  Vaping Use   Vaping Use: Never used  Substance Use Topics   Alcohol use: Yes    Comment: socially   Drug use:  No     Allergies   Amoxicillin and Penicillins   Review of Systems Review of Systems  Constitutional:  Positive for activity change. Negative for appetite change, fatigue and fever.  Gastrointestinal:  Negative for abdominal pain, diarrhea, nausea and vomiting.  Genitourinary:  Positive for vaginal discharge and vaginal pain. Negative for dysuria, frequency, pelvic pain, urgency and vaginal bleeding.  Musculoskeletal:  Positive for arthralgias and myalgias.  Neurological:  Positive for numbness. Negative for dizziness, weakness, light-headedness and headaches.    Physical Exam Triage Vital Signs ED Triage Vitals [10/20/21 1714]  Enc Vitals Group     BP 131/90     Pulse Rate 71     Resp 18     Temp 97.9 F (36.6 C)     Temp src      SpO2 94 %     Weight      Height      Head Circumference      Peak Flow      Pain Score 0     Pain Loc      Pain Edu?      Excl. in [place]?    No data found.  Updated Vital Signs BP 131/90   Pulse 71   Temp 97.9 F (36.6 C)   Resp 18   SpO2 94%   Visual Acuity Right Eye Distance:   Left Eye Distance:   Bilateral Distance:    Right Eye Near:   Left Eye Near:    Bilateral Near:     Physical Exam Vitals reviewed.  Constitutional:      General: She is awake. She is not in acute distress.    Appearance: Normal appearance. She is well-developed. She is not ill-appearing.     Comments: Very pleasant female appears stated age in no acute distress sitting comfortably in exam room  HENT:     Head: Normocephalic and atraumatic.  Cardiovascular:     Rate and Rhythm: Normal rate and regular rhythm.     Heart sounds: Normal heart sounds, S1 normal and S2 normal. No murmur heard. Pulmonary:     Effort: Pulmonary effort is normal.     Breath sounds: Normal breath sounds. No wheezing, rhonchi or rales.     Comments: Clear to auscultation bilaterally Abdominal:     General: Bowel sounds are normal.     Palpations: Abdomen is soft.      Tenderness: There is no abdominal tenderness. There is no right CVA tenderness, left CVA tenderness, guarding or rebound.     Comments: Benign abdominal exam  Genitourinary:    Labia:        Right: No rash or tenderness.        Left: No rash or tenderness.      Vagina: Vaginal discharge present. No tenderness or bleeding.     Cervix: Normal.     Uterus: Normal.      Comments: Thick white discharge noted vaginal walls and posterior vaginal vault. Musculoskeletal:     Cervical back: No tenderness or bony tenderness.     Thoracic back: No tenderness or bony tenderness.     Lumbar back: No tenderness or bony tenderness.     Comments: No pain with palpation of paraspinal muscles or pain percussion of vertebrae.  Strength 5/5 bilateral upper extremities.  Hand neurovascularly intact.  Normal pincer grip strength.  Psychiatric:        Behavior: Behavior is cooperative.     UC Treatments / Results  Labs (all labs ordered are listed, but only abnormal results are displayed) Labs Reviewed  HIV ANTIBODY (ROUTINE TESTING W REFLEX)  HEPATITIS C ANTIBODY  RPR  HCG, QUANTITATIVE, PREGNANCY  POCT URINALYSIS DIPSTICK, ED / UC  POC URINE PREG, ED  CERVICOVAGINAL ANCILLARY ONLY    EKG   Radiology No results found.  Procedures Procedures (including critical care time)  Medications Ordered in UC Medications - No data to display  Initial Impression / Assessment and Plan / UC Course  I have reviewed the triage vital signs and the nursing notes.  Pertinent labs & imaging results that were available during my care of the patient were reviewed by me and considered in my medical decision making (see chart for details).     Suspect vaginal irritation is related to yeast based on clinical presentation and physical exam findings.  Urine was normal.  Urine pregnancy was negative.  Patient requested quantitative hCG given previous pregnancy with negative urine pregnancy test initially; this was  obtained I discussed that she should hold all other medications until results are available as they are not recommended in pregnancy.  Suspect this will be negative at which point she can start Diflucan and Naprosyn as prescribed.  Patient was started on Diflucan 1 dose every 72 hours for up to 2 doses.  STI swab was collected and blood work for HIV/hepatitis/syphilis is pending.  Discussed that if her lab work is abnormal we will contact her to arrange additional treatment.  She is to use hypoallergenic soaps and detergents and wear loosefitting cotton underwear.  If she develops any worsening symptoms she needs to return for reevaluation.  Discussed that arm pain/numbness tingling symptoms sound to be neuropathic in origin.  She was started on Naprosyn to help with pain and inflammation with instruction not to take additional NSAIDs with this medication.  Discussed that ultimately she would likely need to see a neurologist for nerve conduction studies.  A referral was placed but it discussed that we typically do not place referrals and do not have staff to follow-up on this.  Recommended that she also call and schedule an appointment on her own and was given contact information for local provider.  If she develops any worsening symptoms she is to be seen immediately to which she expressed understanding.  Work excuse note provided.  Final Clinical Impressions(s) / UC Diagnoses   Final diagnoses:  Vaginal irritation  Vaginitis and vulvovaginitis  Routine screening for STI (sexually transmitted infection)  Numbness and tingling of right arm     Discharge Instructions  I believe that your irritation is related to yeast.  Please start Diflucan 1 dose today and the second dose in 72 hours.  Wear loosefitting cotton underwear and use hypoallergenic soaps and detergents.  We will contact you if any of your lab work is abnormal and we need to change your treatment plan.  I have placed a referral to  neurology but I am not sure if this will go through.  Please call and schedule an appointment with neurology.  Your symptoms do sound like nerve irritation.  Start Naprosyn to help with pain and inflammation.  Do not take NSAIDs including aspirin, ibuprofen/Advil, naproxen/Aleve with this medication as it can cause stomach bleeding.  If you develop any worsening pain including weakness in your arm, severe pain, neck pain, fever, numbness/tingling in the hand that does not go away or severe please be seen immediately.     ED Prescriptions     Medication Sig Dispense Auth. Provider   fluconazole (DIFLUCAN) 150 MG tablet Take 1 tablet (150 mg total) by mouth every 3 (three) days as needed. 2 tablet Kaleb Linquist K, PA-C   naproxen (NAPROSYN) 500 MG tablet Take 1 tablet (500 mg total) by mouth 2 (two) times daily. 30 tablet Dyllin Gulley, Derry Skill, PA-C      PDMP not reviewed this encounter.   Terrilee Croak, PA-C 10/20/21 1836

## 2021-10-20 NOTE — Discharge Instructions (Addendum)
I believe that your irritation is related to yeast.  Please start Diflucan 1 dose today and the second dose in 72 hours.  Wear loosefitting cotton underwear and use hypoallergenic soaps and detergents.  We will contact you if any of your lab work is abnormal and we need to change your treatment plan.  I have placed a referral to neurology but I am not sure if this will go through.  Please call and schedule an appointment with neurology.  Your symptoms do sound like nerve irritation.  Start Naprosyn to help with pain and inflammation.  Do not take NSAIDs including aspirin, ibuprofen/Advil, naproxen/Aleve with this medication as it can cause stomach bleeding.  If you develop any worsening pain including weakness in your arm, severe pain, neck pain, fever, numbness/tingling in the hand that does not go away or severe please be seen immediately.

## 2021-10-21 ENCOUNTER — Encounter: Payer: Self-pay | Admitting: Neurology

## 2021-10-21 LAB — CERVICOVAGINAL ANCILLARY ONLY
Bacterial Vaginitis (gardnerella): NEGATIVE
Candida Glabrata: NEGATIVE
Candida Vaginitis: NEGATIVE
Chlamydia: NEGATIVE
Comment: NEGATIVE
Comment: NEGATIVE
Comment: NEGATIVE
Comment: NEGATIVE
Comment: NEGATIVE
Comment: NORMAL
Neisseria Gonorrhea: NEGATIVE
Trichomonas: NEGATIVE

## 2021-10-21 LAB — RPR: RPR Ser Ql: NONREACTIVE

## 2021-10-26 ENCOUNTER — Telehealth (HOSPITAL_COMMUNITY): Payer: Self-pay | Admitting: Emergency Medicine

## 2021-10-26 DIAGNOSIS — R200 Anesthesia of skin: Secondary | ICD-10-CM

## 2021-10-26 MED ORDER — NAPROXEN 500 MG PO TABS: 500.0000 mg | ORAL_TABLET | Freq: Two times a day (BID) | ORAL | 0 refills | Status: DC

## 2021-10-26 NOTE — Telephone Encounter (Signed)
Patient called with concerns about her continued symptoms with negative test.  Reviewed with Junie Panning, APP who okay'd for patient to return for RN visit for self swab and recollect of cytology.   Patient notified, and updated that if all testing returns negative again she will want to follow up with a gynecologist for further evaluation.  Also resending 10 days of naproxen for patient, with okay from [place], [place], due to she states she left her prescription out of town and needs a refill (did not want to buy OTC)

## 2021-11-01 ENCOUNTER — Encounter (HOSPITAL_BASED_OUTPATIENT_CLINIC_OR_DEPARTMENT_OTHER): Payer: Self-pay | Admitting: Urology

## 2021-11-01 ENCOUNTER — Telehealth: Payer: Self-pay | Admitting: Emergency Medicine

## 2021-11-01 ENCOUNTER — Emergency Department (HOSPITAL_BASED_OUTPATIENT_CLINIC_OR_DEPARTMENT_OTHER)
Admission: EM | Admit: 2021-11-01 | Discharge: 2021-11-01 | Disposition: A | Discharge status: Home / Self Care | Payer: Medicaid Other | Attending: Emergency Medicine | Admitting: Emergency Medicine

## 2021-11-01 ENCOUNTER — Emergency Department (HOSPITAL_BASED_OUTPATIENT_CLINIC_OR_DEPARTMENT_OTHER): Payer: Medicaid Other

## 2021-11-01 ENCOUNTER — Ambulatory Visit
Admission: RE | Admit: 2021-11-01 | Discharge: 2021-11-01 | Disposition: A | Discharge status: Other Acute Inpatient Hospital | Payer: Medicaid Other | Source: Ambulatory Visit | Attending: Urgent Care | Admitting: Urgent Care

## 2021-11-01 ENCOUNTER — Other Ambulatory Visit: Payer: Self-pay

## 2021-11-01 VITALS — BP 113/70 | HR 62 | Temp 97.9°F | Resp 14

## 2021-11-01 DIAGNOSIS — R1032 Left lower quadrant pain: Secondary | ICD-10-CM

## 2021-11-01 DIAGNOSIS — R350 Frequency of micturition: Secondary | ICD-10-CM | POA: Diagnosis not present

## 2021-11-01 DIAGNOSIS — N83202 Unspecified ovarian cyst, left side: Secondary | ICD-10-CM | POA: Diagnosis not present

## 2021-11-01 DIAGNOSIS — K760 Fatty (change of) liver, not elsewhere classified: Secondary | ICD-10-CM | POA: Diagnosis not present

## 2021-11-01 DIAGNOSIS — R109 Unspecified abdominal pain: Secondary | ICD-10-CM | POA: Diagnosis not present

## 2021-11-01 DIAGNOSIS — K5730 Diverticulosis of large intestine without perforation or abscess without bleeding: Secondary | ICD-10-CM | POA: Diagnosis not present

## 2021-11-01 DIAGNOSIS — R3915 Urgency of urination: Secondary | ICD-10-CM | POA: Diagnosis not present

## 2021-11-01 LAB — COMPREHENSIVE METABOLIC PANEL
ALT: 16 U/L (ref 0–44)
AST: 17 U/L (ref 15–41)
Albumin: 3.5 g/dL (ref 3.5–5.0)
Alkaline Phosphatase: 77 U/L (ref 38–126)
Anion gap: 4 — ABNORMAL LOW (ref 5–15)
BUN: 7 mg/dL (ref 6–20)
CO2: 27 mmol/L (ref 22–32)
Calcium: 8.5 mg/dL — ABNORMAL LOW (ref 8.9–10.3)
Chloride: 106 mmol/L (ref 98–111)
Creatinine, Ser: 0.64 mg/dL (ref 0.44–1.00)
GFR, Estimated: >60 mL/min (ref >60)
Glucose, Bld: 85 mg/dL (ref 70–99)
Potassium: 4.3 mmol/L (ref 3.5–5.1)
Sodium: 137 mmol/L (ref 135–145)
Total Bilirubin: 0.6 mg/dL (ref 0.3–1.2)
Total Protein: 7 g/dL (ref 6.5–8.1)

## 2021-11-01 LAB — CBC WITH DIFFERENTIAL/PLATELET
Abs Immature Granulocytes: 0.03 10*3/uL (ref 0.00–0.07)
Basophils Absolute: 0.1 10*3/uL (ref 0.0–0.1)
Basophils Relative: 1 %
Eosinophils Absolute: 0.2 10*3/uL (ref 0.0–0.5)
Eosinophils Relative: 2 %
HCT: 40.2 % (ref 36.0–46.0)
Hemoglobin: 13.1 g/dL (ref 12.0–15.0)
Immature Granulocytes: 0 %
Lymphocytes Relative: 27 %
Lymphs Abs: 2.6 10*3/uL (ref 0.7–4.0)
MCH: 27.3 pg (ref 26.0–34.0)
MCHC: 32.6 g/dL (ref 30.0–36.0)
MCV: 83.8 fL (ref 80.0–100.0)
Monocytes Absolute: 0.8 10*3/uL (ref 0.1–1.0)
Monocytes Relative: 8 %
Neutro Abs: 6.2 10*3/uL (ref 1.7–7.7)
Neutrophils Relative %: 62 %
Platelets: 335 10*3/uL (ref 150–400)
RBC: 4.8 MIL/uL (ref 3.87–5.11)
RDW: 14.4 % (ref 11.5–15.5)
WBC: 9.8 10*3/uL (ref 4.0–10.5)
nRBC: 0 % (ref 0.0–0.2)

## 2021-11-01 LAB — URINALYSIS, ROUTINE W REFLEX MICROSCOPIC
Bilirubin Urine: NEGATIVE
Glucose, UA: NEGATIVE mg/dL
Hgb urine dipstick: NEGATIVE
Ketones, ur: NEGATIVE mg/dL
Leukocytes,Ua: NEGATIVE
Nitrite: NEGATIVE
Protein, ur: NEGATIVE mg/dL
Specific Gravity, Urine: 1.02 (ref 1.005–1.030)
pH: 7 (ref 5.0–8.0)

## 2021-11-01 LAB — LIPASE, BLOOD: Lipase: 24 U/L (ref 11–51)

## 2021-11-01 LAB — POCT URINALYSIS DIP (MANUAL ENTRY)
Bilirubin, UA: NEGATIVE
Blood, UA: NEGATIVE
Glucose, UA: NEGATIVE mg/dL
Ketones, POC UA: NEGATIVE mg/dL
Nitrite, UA: NEGATIVE
Protein Ur, POC: NEGATIVE mg/dL
Spec Grav, UA: 1.02 (ref 1.010–1.025)
Urobilinogen, UA: 0.2 E.U./dL
pH, UA: 7 (ref 5.0–8.0)

## 2021-11-01 LAB — PREGNANCY, URINE: Preg Test, Ur: NEGATIVE

## 2021-11-01 MED ORDER — OXYCODONE-ACETAMINOPHEN 5-325 MG PO TABS: 1.0000 | ORAL_TABLET | Freq: Once | ORAL | Status: DC

## 2021-11-01 MED ORDER — NAPROXEN 500 MG PO TABS: 500.0000 mg | ORAL_TABLET | Freq: Two times a day (BID) | ORAL | 0 refills | Status: DC

## 2021-11-01 MED ORDER — KETOROLAC TROMETHAMINE 15 MG/ML IJ SOLN
15.0000 mg | Freq: Once | INTRAMUSCULAR | Status: AC
  Administered 2021-11-01: 15 mg via INTRAVENOUS
  Filled 2021-11-01: qty 1

## 2021-11-01 MED ORDER — IOHEXOL 300 MG/ML  SOLN
100.0000 mL | Freq: Once | INTRAMUSCULAR | Status: AC | PRN
  Administered 2021-11-01: 100 mL via INTRAVENOUS

## 2021-11-01 NOTE — ED Triage Notes (Signed)
Seen at [facility], sent here for imaging LLQ pain x 2-3 weeks, worsening over the last 5-6 days Denies any blood in urine States pain worse when bladder full, feels relief after urinating

## 2021-11-01 NOTE — ED Triage Notes (Signed)
Pt here with sharp LLQ pain x 5 days. Pt also has an increased urge to urinate. No vaginal discharge, no concern for pregnancy.

## 2021-11-01 NOTE — Discharge Instructions (Addendum)
Your Ultrasound revealed a 6 cm left ovarian cyst that could be causing your symptoms. You had good blood flow to  both ovaries today. Please schedule a follow up appointment with [facility] for your Ovarian Cyst.  I recommend alternating 600 mg Ibuprofen with 650 mg Tylenol every 4-6 hours for 3-4 days. Please return if worsening symptoms.

## 2021-11-01 NOTE — ED Notes (Signed)
Patient transported to [facility] 

## 2021-11-01 NOTE — ED Provider Notes (Signed)
Melanie Cisneros   MRN: [id number] DOB: [birth year]  Subjective:   Melanie Cisneros is a 31 y.o. female presenting for 5-day history of acute onset persistent and worsening abdominal pain, left flank pain, left-sided pelvic pain.  Patient also feels the urgency to urinate, is peeing more frequently.  States that the left-sided flank pain gets much worse when she urinates.  She did have an office visit earlier in June and had complete work-up most of which was negative.  Has a history of BV but states that she is not concerned about this.  She also has a history of chlamydia and gonorrhea and is not opposed to rechecking for this.  No gynecologic history otherwise.  Patient did a Producer, television/film/video and is very concerned about interstitial cystitis.  She would like to have more testing done them was done last time to help her figure out why she is hurting.  No current facility-administered medications for this encounter.  Current Outpatient Medications:    AZO BORIC ACID 600 MG SUPP, Take 600 mg by mouth., Disp: , Rfl:    fluconazole (DIFLUCAN) 150 MG tablet, Take 1 tablet (150 mg total) by mouth every 3 (three) days as needed., Disp: 2 tablet, Rfl: 0   naproxen (NAPROSYN) 500 MG tablet, Take 1 tablet (500 mg total) by mouth 2 (two) times daily for 10 days., Disp: 20 tablet, Rfl: 0   Allergies  Allergen Reactions   Amoxicillin Anaphylaxis, Hives and Other (See Comments)    Has patient had a PCN reaction causing immediate rash, facial/tongue/throat swelling, SOB or lightheadedness with hypotension: Yes Has patient had a PCN reaction causing severe rash involving mucus membranes or skin necrosis: No Has patient had a PCN reaction that required hospitalization No Has patient had a PCN reaction occurring within the last 10 years: No If all of the above answers are "NO", then may proceed with Cephalosporin use.   Penicillins Anaphylaxis, Hives and Other (See Comments)    Has patient  had a PCN reaction causing immediate rash, facial/tongue/throat swelling, SOB or lightheadedness with hypotension: Yes Has patient had a PCN reaction causing severe rash involving mucus membranes or skin necrosis: No Has patient had a PCN reaction that required hospitalization No Has patient had a PCN reaction occurring within the last 10 years: No If all of the above answers are "NO", then may proceed with Cephalosporin use.    Past Medical History:  Diagnosis Date   Bacterial vaginosis    Chlamydia    Gonorrhea    Obesity    UTI (lower urinary tract infection)      Past Surgical History:  Procedure Laterality Date   WISDOM TOOTH EXTRACTION      Family History  Problem Relation Age of Onset   Hypertension Mother    Healthy Father     Social History   Tobacco Use   Smoking status: Never   Smokeless tobacco: Never  Vaping Use   Vaping Use: Never used  Substance Use Topics   Alcohol use: Yes    Comment: socially   Drug use: No    ROS   Objective:   Vitals: BP 113/70 (BP Location: Left Arm)   Pulse 62   Temp 97.9 F (36.6 C) (Oral)   Resp 14   SpO2 98%   Physical Exam Constitutional:      General: She is not in acute distress.    Appearance: Normal appearance. She is well-developed. She is not ill-appearing,  toxic-appearing or diaphoretic.  HENT:     Head: Normocephalic and atraumatic.     Nose: Nose normal.     Mouth/Throat:     Mouth: Mucous membranes are moist.     Pharynx: Oropharynx is clear.  Eyes:     General: No scleral icterus.       Right eye: No discharge.        Left eye: No discharge.     Extraocular Movements: Extraocular movements intact.     Conjunctiva/sclera: Conjunctivae normal.  Cardiovascular:     Rate and Rhythm: Normal rate.  Pulmonary:     Effort: Pulmonary effort is normal.  Abdominal:     General: Bowel sounds are normal. There is no distension.     Palpations: Abdomen is soft. There is no mass.     Tenderness: There  is abdominal tenderness (patient chopped at my right arm and pushed me away on light palpation of her left lower abdomen/pelvic area) in the left lower quadrant. There is no right CVA tenderness, left CVA tenderness, guarding or rebound.  Skin:    General: Skin is warm and dry.  Neurological:     General: No focal deficit present.     Mental Status: She is alert and oriented to person, place, and time.  Psychiatric:        Mood and Affect: Mood normal.        Behavior: Behavior normal.        Thought Content: Thought content normal.        Judgment: Judgment normal.     Results for orders placed or performed during the hospital encounter of 11/01/21 (from the past 24 hour(s))  POCT urinalysis dipstick     Status: Abnormal   Collection Time: 11/01/21  9:35 AM  Result Value Ref Range   Color, UA light yellow (A) yellow   Clarity, UA clear clear   Glucose, UA negative negative mg/dL   Bilirubin, UA negative negative   Ketones, POC UA negative negative mg/dL   Spec Grav, UA 1.020 1.010 - 1.025   Blood, UA negative negative   pH, UA 7.0 5.0 - 8.0   Protein Ur, POC negative negative mg/dL   Urobilinogen, UA 0.2 0.2 or 1.0 E.U./dL   Nitrite, UA Negative Negative   Leukocytes, UA Trace (A) Negative    Assessment and Plan :   PDMP not reviewed this encounter.  1. Left flank pain   2. Urinary frequency   3. Urinary urgency    Patient requested a recheck of the vaginal swab and therefore we will do this.  I offered her IM Toradol for her severe pain but she refused.  Recommended naproxen as an outpatient.  As she had severe pain on light palpation of her abdomen to the point that she push me away, I recommended presenting to the emergency room for consideration of imaging as this would be the next step in her work-up.  Patient does have a follow-up appointment with her gynecologist but is not until August.  She plans on going to the ER now.   Jaynee Eagles, [place] 11/01/21 [phone]

## 2021-11-01 NOTE — ED Provider Notes (Signed)
[facility] EMERGENCY DEPARTMENT Provider Note   CSN: [id number] Arrival date & time: 11/01/21  1028     History PMH: chlamydia/gonorrhea, obesity Chief Complaint  Patient presents with   Abdominal Pain    Melanie Cisneros is a 31 y.o. female. Presents the emergency department with a chief complaint of left lower quadrant abdominal pain.  She says this pain actually started 2 to 3 weeks ago but has been intermittent.  She been on June 7 and diagnosed with a yeast infection noted taking Diflucan.  She was also tested for multiple STDs that time including HIV, hepatitis C, syphilis, gonorrhea, chlamydia which were negative.  She did negative pregnancy test.   She states over the past 6 to 7 days, her pain is gotten a lot worse and is more constant.  She says it is in the left lower quadrant of her abdomen still.  She says that when she has to urinate it makes the pain worse, but the pain gets better after she finishes urinating.  She denies any burning with urination.  She was seen again by urgent care today for the worsening abdominal pain.  They did a urine sample today which did not show any significant bacteria.  She was already tested for gonorrhea, chlamydia.  Sent her to the emergency department due to significant pain of the left lower quadrant on exam for consideration of CT imaging.  She does state that her periods have been more irregular than normal and that she has not had a menstrual cycle for 2 months. Most recent pregnancy test two weeks ago was negative.  She denies any fevers, chills, diarrhea, constipation, blood in stool, nausea, vomiting, dysuria, hematuria, flank pain, vaginal discharge.   Abdominal Pain Associated symptoms: no chest pain, no chills, no diarrhea, no dysuria, no fever, no hematuria, no nausea, no shortness of breath, no vaginal discharge and no vomiting        Home Medications Prior to Admission medications   Medication Sig Start Date End  Date Taking? Authorizing Provider  AZO BORIC ACID 600 MG SUPP Take 600 mg by mouth. 07/05/21   [provider]  fluconazole (DIFLUCAN) 150 MG tablet Take 1 tablet (150 mg total) by mouth every 3 (three) days as needed. 10/20/21   Raspet, Derry Skill, PA-C  naproxen (NAPROSYN) 500 MG tablet Take 1 tablet (500 mg total) by mouth 2 (two) times daily with a meal. 11/01/21   Jaynee Eagles, PA-C  albuterol (VENTOLIN HFA) 108 (90 Base) MCG/ACT inhaler Inhale 1-2 puffs into the lungs every 6 (six) hours as needed for wheezing or shortness of breath. Patient not taking: Reported on 04/14/2020 10/04/19 04/27/20  [name], FNP      Allergies    Amoxicillin and Penicillins    Review of Systems   Review of Systems  Constitutional:  Negative for chills and fever.  Respiratory:  Negative for shortness of breath.   Cardiovascular:  Negative for chest pain.  Gastrointestinal:  Positive for abdominal pain. Negative for abdominal distention, anal bleeding, blood in stool, diarrhea, nausea and vomiting.  Genitourinary:  Negative for dysuria, flank pain, hematuria and vaginal discharge.  All other systems reviewed and are negative.   Physical Exam Updated Vital Signs BP 130/74   Pulse (!) 58   Temp 98.3 F (36.8 C) (Oral)   Resp 18   Ht '5\' 5"'$  (1.651 m)   Wt 133 kg   SpO2 99%   BMI 48.79 kg/m  Physical  Exam Vitals and nursing note reviewed.  Constitutional:      General: She is not in acute distress.    Appearance: Normal appearance. She is not ill-appearing, toxic-appearing or diaphoretic.  HENT:     Head: Normocephalic and atraumatic.     Nose: No nasal deformity.     Mouth/Throat:     Lips: Pink. No lesions.     Mouth: No injury, lacerations, oral lesions or angioedema.     Pharynx: Uvula midline. No pharyngeal swelling or uvula swelling.  Eyes:     General: Gaze aligned appropriately. No scleral icterus.       Right eye: No discharge.        Left eye: No discharge.      Conjunctiva/sclera: Conjunctivae normal.     Right eye: Right conjunctiva is not injected. No exudate or hemorrhage.    Left eye: Left conjunctiva is not injected. No exudate or hemorrhage. Cardiovascular:     Pulses:          Radial pulses are 2+ on the right side and 2+ on the left side.       Dorsalis pedis pulses are 2+ on the right side and 2+ on the left side.     Heart sounds: S1 normal and S2 normal. Heart sounds not distant.     No S3 or S4 sounds.  Pulmonary:     Effort: Pulmonary effort is normal. No accessory muscle usage or respiratory distress.     Breath sounds: No stridor.  Abdominal:     General: Abdomen is flat. There is no distension.     Palpations: Abdomen is soft. There is no mass or pulsatile mass.     Tenderness: There is abdominal tenderness in the suprapubic area and left lower quadrant. There is no right CVA tenderness, left CVA tenderness, guarding or rebound. Negative signs include Murphy's sign, Rovsing's sign and McBurney's sign.     Comments: Moderate LLQ tenderness. Patient pushes me away to light palpation. No guarding or rigidity.  Musculoskeletal:     Right lower leg: No edema.     Left lower leg: No edema.  Skin:    General: Skin is warm and dry.     Coloration: Skin is not jaundiced or pale.     Findings: No bruising, erythema, lesion or rash.  Neurological:     General: No focal deficit present.     Mental Status: She is alert and oriented to person, place, and time.     GCS: GCS eye subscore is 4. GCS verbal subscore is 5. GCS motor subscore is 6.  Psychiatric:        Mood and Affect: Mood normal.        Behavior: Behavior normal. Behavior is cooperative.     ED Results / Procedures / Treatments   Labs (all labs ordered are listed, but only abnormal results are displayed) Labs Reviewed  COMPREHENSIVE METABOLIC PANEL - Abnormal; Notable for the following components:      Result Value   Calcium 8.5 (*)    Anion gap 4 (*)    All other  components within normal limits  CBC WITH DIFFERENTIAL/PLATELET  LIPASE, BLOOD  PREGNANCY, URINE  URINALYSIS, ROUTINE W REFLEX MICROSCOPIC    EKG None  Radiology US PELVIC COMPLETE W TRANSVAGINAL AND TORSION R/O  Result Date: 11/01/2021 CLINICAL DATA:  Left ovarian cyst identified by CT, left pelvic and flank pain EXAM: TRANSABDOMINAL AND TRANSVAGINAL ULTRASOUND OF PELVIS DOPPLER ULTRASOUND OF OVARIES  TECHNIQUE: Both transabdominal and transvaginal ultrasound examinations of the pelvis were performed. Transabdominal technique was performed for global imaging of the pelvis including uterus, ovaries, adnexal regions, and pelvic cul-de-sac. It was necessary to proceed with endovaginal exam following the transabdominal exam to visualize the uterus, endometrium, ovaries, and adnexa. Color and duplex Doppler ultrasound was utilized to evaluate blood flow to the ovaries. COMPARISON:  None Available. FINDINGS: Uterus Measurements: 10.7 x 5.9 x 5.9 cm = volume: 197 mL. No fibroids or other mass visualized. Endometrium Thickness: 1.6 cm.  No focal abnormality visualized. Right ovary Measurements: 2.8 x 1.8 x 2.1 cm = volume: 6 mL. Normal appearance/no adnexal mass. Left ovary Measurements: 6.8 x 5.7 x 5.7 cm = volume: 116 mL. Left ovary is enlarged by a simple appearing cyst measuring 6.1 x 5.2 x 5.0 cm Pulsed Doppler evaluation of both ovaries demonstrates normal low-resistance arterial and venous waveforms. Other findings Trace free fluid in the low pelvis. IMPRESSION: 1. The left ovary is enlarged by a simple appearing cyst measuring 6.1 x 5.2 x 5.0 cm. Although most likely benign and functional, recommend follow-up US in 3-6 months. Note: This recommendation does not apply to premenarchal patients or to those with increased risk (genetic, family history, elevated tumor markers or other high-risk factors) of ovarian cancer. Reference: Radiology 2019 Nov; 293(2):359-371. 2. Arterial and venous Doppler flow is  present to the bilateral ovaries. However, please note that intermittent or incomplete ovarian torsion can not be excluded by ultrasound in any enlarged ovary. 3.  No other ultrasound abnormality of the pelvis. Electronically Signed   By: Delanna Ahmadi M.D.   On: 11/01/2021 15:04   CT ABDOMEN PELVIS W CONTRAST  Result Date: 11/01/2021 CLINICAL DATA:  Left lower quadrant abdominal pain/left flank pain. Urinary frequency and urgency. EXAM: CT ABDOMEN AND PELVIS WITH CONTRAST TECHNIQUE: Multidetector CT imaging of the abdomen and pelvis was performed using the standard protocol following bolus administration of intravenous contrast. RADIATION DOSE REDUCTION: This exam was performed according to the departmental dose-optimization program which includes automated exposure control, adjustment of the mA and/or kV according to patient size and/or use of iterative reconstruction technique. CONTRAST:  131m OMNIPAQUE IOHEXOL 300 MG/ML  SOLN COMPARISON:  CT abdomen and pelvis 10/02/2018. Pelvic ultrasound 02/26/2021. FINDINGS: Lower chest: Clear lung bases. Hepatobiliary: Diffusely decreased attenuation of the liver suggesting steatosis. No focal liver abnormality. Unremarkable gallbladder. No biliary dilatation. Pancreas: Unremarkable. Spleen: Unremarkable. Adrenals/Urinary Tract: Unremarkable adrenal glands. No evidence of a renal mass, calculi, or hydronephrosis. Unremarkable bladder. Stomach/Bowel: The stomach is unremarkable. There is no evidence of bowel obstruction or inflammation. Mild colonic diverticulosis is noted without evidence of diverticulitis. The appendix is unremarkable. Vascular/Lymphatic: Normal caliber of the abdominal aorta. No enlarged lymph nodes. Reproductive: Unremarkable uterus. 6 cm simple appearing left adnexal cyst. Other: No ascites or pneumoperitoneum. Small fat-containing umbilical hernia. Musculoskeletal: No acute osseous abnormality or suspicious osseous lesion. IMPRESSION: 1. No acute  abnormality identified in the abdomen or pelvis. 2. 6 cm left adnexal simple-appearing cyst. Recommend follow-up pelvic [place] 3-6 months. Reference: JACR 2020 Feb;17(2):248-254 3. Hepatic steatosis, mild colonic diverticulosis, and small fat-containing umbilical hernia. Electronically Signed   By: ALogan BoresM.D.   On: 11/01/2021 13:23    Procedures Procedures   Medications Ordered in ED Medications  oxyCODONE-acetaminophen (PERCOCET/ROXICET) 5-325 MG per tablet 1 tablet (1 tablet Oral Not Given 11/01/21 1424)  ketorolac (TORADOL) 15 MG/ML injection 15 mg (15 mg Intravenous Given 11/01/21 1247)  iohexol (OMNIPAQUE) 300  MG/ML solution 100 mL (100 mLs Intravenous Contrast Given 11/01/21 1257)    ED Course/ Medical Decision Making/ A&P                           Medical Decision Making Amount and/or Complexity of Data Reviewed Labs: ordered. Radiology: ordered.  Risk Prescription drug management.    MDM  This is a 31 y.o. female who presents to the ED with LLQ abdominal pain The differential of this patient includes but is not limited to Appendicitis, hernia, Diverticulitis, Ectopic Pregnancy, Ovarian Torsion, PID/TOA, Period/Fibroids, UTI, Constipation  My Impression, Plan, and ED Course:  Well appearing. Afebrile. Vitals Stable. LLQ abdominal tenderness to light palpation. Due to body habitus, I don't think I was able to even palpate beyond soft tissue as patient was pushing me away.  She has already had numerous negative STD tests and without vaginal discharge, I think that STD and PID are less likely at this point.   We will need labs, repeat urine/pregnancy test, and likely CT imaging to r/u diverticulitis.  I personally ordered, reviewed, and interpreted all laboratory work and imaging and agree with radiologist interpretation. Results interpreted below: . CBC is without leukocytosis or anemia.  It is normal.  CMP reveals a calcium of 8.5 which is her baseline.  No other  abnormalities are noted.  Lipase is 24.  Pregnancy negative.  Urinalysis shows no evidence of any infection or bacteria. CT imaging of the abdomen and pelvis noted a 6 cm left adnexal simple appearing cyst.  No other acute abnormality was noted.  On reassessment, pain has improved, but has not completely gone away.  In the setting of worsening left lower quadrant pain with a 6 cm cyst, this increases her risk for torsion. Will obtain transvaginal US to assess blood flow.   '@1504'$ : US reveals left ovarian enlargement due to a simple appearing cyst measuring 6.1 x 5.2 x 5.0 cm. No pelvic free fluid. Arterial and venous doppler flow present bilaterally.  Pain is improved on reassessment. She has refused further pain medication. [place] did not suggest torsion. In the setting of improved pain and longevity of pain being less suggestive of torsion, I have lower suspicion for an [place] negative torsion. She has remained stable and is requesting discharge.  I have discussed recommendation to follow up with GYN regarding her cyst. I have also provided strict return precautions.   Charting Requirements Additional history is obtained from:  Independent historian External Records from outside source obtained and reviewed including: Reviewed recent UC notes, STD testing results, UA results Social Determinants of Health:  none Pertinant PMH that complicates patient's illness: n/a  Patient Care Problems that were addressed during this visit: - Left Ovarian Cyst: Acute illness with systemic symptoms - Left Lower Quadrant Abdominal pain: Acute illness with complication Medications given in ED: Toradol Reevaluation of the patient after these medicines showed that the patient improved I have reviewed home medications and made changes accordingly.  Critical Care Interventions: n/a Consultations: n/a Disposition: discharge  Portions of this note were generated with Dragon dictation software. Dictation errors may occur  despite best attempts at proofreading.    Final Clinical Impression(s) / ED Diagnoses Final diagnoses:  Cyst of left ovary  Left lower quadrant abdominal pain    Rx / DC Orders ED Discharge Orders     None         Adolphus Birchwood, PA-C 11/01/21 1529  Lucrezia Starch, MD 11/02/21 [phone]

## 2021-11-02 LAB — URINE CULTURE: Culture: NO GROWTH

## 2021-11-02 LAB — CERVICOVAGINAL ANCILLARY ONLY
Bacterial Vaginitis (gardnerella): NEGATIVE
Chlamydia: NEGATIVE
Comment: NEGATIVE
Comment: NEGATIVE
Comment: NEGATIVE
Comment: NORMAL
Neisseria Gonorrhea: NEGATIVE
Trichomonas: NEGATIVE

## 2021-11-03 ENCOUNTER — Emergency Department (HOSPITAL_COMMUNITY)
Admission: EM | Admit: 2021-11-03 | Discharge: 2021-11-03 | Disposition: A | Discharge status: Home / Self Care | Payer: Medicaid Other | Attending: Emergency Medicine | Admitting: Emergency Medicine

## 2021-11-03 ENCOUNTER — Other Ambulatory Visit: Payer: Self-pay

## 2021-11-03 ENCOUNTER — Emergency Department (HOSPITAL_COMMUNITY): Payer: Medicaid Other

## 2021-11-03 DIAGNOSIS — R1032 Left lower quadrant pain: Secondary | ICD-10-CM

## 2021-11-03 DIAGNOSIS — R0689 Other abnormalities of breathing: Secondary | ICD-10-CM | POA: Diagnosis not present

## 2021-11-03 DIAGNOSIS — R1084 Generalized abdominal pain: Secondary | ICD-10-CM | POA: Diagnosis not present

## 2021-11-03 DIAGNOSIS — R9431 Abnormal electrocardiogram [ECG] [EKG]: Secondary | ICD-10-CM | POA: Diagnosis not present

## 2021-11-03 DIAGNOSIS — N83202 Unspecified ovarian cyst, left side: Secondary | ICD-10-CM | POA: Insufficient documentation

## 2021-11-03 DIAGNOSIS — N83209 Unspecified ovarian cyst, unspecified side: Secondary | ICD-10-CM

## 2021-11-03 DIAGNOSIS — R102 Pelvic and perineal pain: Secondary | ICD-10-CM | POA: Diagnosis not present

## 2021-11-03 DIAGNOSIS — R231 Pallor: Secondary | ICD-10-CM | POA: Diagnosis not present

## 2021-11-03 LAB — COMPREHENSIVE METABOLIC PANEL
ALT: 17 U/L (ref 0–44)
AST: 17 U/L (ref 15–41)
Albumin: 3.5 g/dL (ref 3.5–5.0)
Alkaline Phosphatase: 75 U/L (ref 38–126)
Anion gap: 9 (ref 5–15)
BUN: 6 mg/dL (ref 6–20)
CO2: 22 mmol/L (ref 22–32)
Calcium: 8.6 mg/dL — ABNORMAL LOW (ref 8.9–10.3)
Chloride: 107 mmol/L (ref 98–111)
Creatinine, Ser: 0.76 mg/dL (ref 0.44–1.00)
GFR, Estimated: >60 mL/min (ref >60)
Glucose, Bld: 81 mg/dL (ref 70–99)
Potassium: 4.1 mmol/L (ref 3.5–5.1)
Sodium: 138 mmol/L (ref 135–145)
Total Bilirubin: 0.4 mg/dL (ref 0.3–1.2)
Total Protein: 6.8 g/dL (ref 6.5–8.1)

## 2021-11-03 LAB — CBC WITH DIFFERENTIAL/PLATELET
Abs Immature Granulocytes: 0.04 10*3/uL (ref 0.00–0.07)
Basophils Absolute: 0.1 10*3/uL (ref 0.0–0.1)
Basophils Relative: 1 %
Eosinophils Absolute: 0.1 10*3/uL (ref 0.0–0.5)
Eosinophils Relative: 1 %
HCT: 41 % (ref 36.0–46.0)
Hemoglobin: 13.4 g/dL (ref 12.0–15.0)
Immature Granulocytes: 0 %
Lymphocytes Relative: 19 %
Lymphs Abs: 2.4 10*3/uL (ref 0.7–4.0)
MCH: 27.9 pg (ref 26.0–34.0)
MCHC: 32.7 g/dL (ref 30.0–36.0)
MCV: 85.2 fL (ref 80.0–100.0)
Monocytes Absolute: 0.8 10*3/uL (ref 0.1–1.0)
Monocytes Relative: 7 %
Neutro Abs: 9.4 10*3/uL — ABNORMAL HIGH (ref 1.7–7.7)
Neutrophils Relative %: 72 %
Platelets: 308 10*3/uL (ref 150–400)
RBC: 4.81 MIL/uL (ref 3.87–5.11)
RDW: 14.5 % (ref 11.5–15.5)
WBC: 12.8 10*3/uL — ABNORMAL HIGH (ref 4.0–10.5)
nRBC: 0 % (ref 0.0–0.2)

## 2021-11-03 LAB — URINALYSIS, ROUTINE W REFLEX MICROSCOPIC
Bilirubin Urine: NEGATIVE
Glucose, UA: NEGATIVE mg/dL
Hgb urine dipstick: NEGATIVE
Ketones, ur: NEGATIVE mg/dL
Leukocytes,Ua: NEGATIVE
Nitrite: NEGATIVE
Protein, ur: NEGATIVE mg/dL
Specific Gravity, Urine: 1.006 (ref 1.005–1.030)
pH: 7 (ref 5.0–8.0)

## 2021-11-03 LAB — LIPASE, BLOOD: Lipase: 28 U/L (ref 11–51)

## 2021-11-03 LAB — I-STAT BETA HCG BLOOD, ED (MC, WL, AP ONLY): I-stat hCG, quantitative: <5 m[IU]/mL (ref <5)

## 2021-11-03 MED ORDER — MORPHINE SULFATE (PF) 4 MG/ML IV SOLN
4.0000 mg | Freq: Once | INTRAVENOUS | Status: AC
  Administered 2021-11-03: 4 mg via INTRAVENOUS
  Filled 2021-11-03: qty 1

## 2021-11-03 MED ORDER — SODIUM CHLORIDE 0.9 % IV BOLUS
1000.0000 mL | Freq: Once | INTRAVENOUS | Status: AC
Start: 2021-11-03 — End: 2021-11-03
  Administered 2021-11-03: 1000 mL via INTRAVENOUS

## 2021-11-03 MED ORDER — ONDANSETRON HCL 4 MG/2ML IJ SOLN
4.0000 mg | Freq: Once | INTRAMUSCULAR | Status: AC
Start: 2021-11-03 — End: 2021-11-03
  Administered 2021-11-03: 4 mg via INTRAVENOUS
  Filled 2021-11-03: qty 2

## 2021-11-03 NOTE — Discharge Instructions (Signed)
Follow-up with gynecology as planned.  Your ultrasound was reassuring today.

## 2021-11-03 NOTE — ED Provider Notes (Incomplete)
[facility] EMERGENCY DEPARTMENT Provider Note   CSN: [id number] Arrival date & time: 11/03/21  1355     History {Add pertinent medical, surgical, social history, OB history to HPI:1} No chief complaint on file.   Melanie Cisneros  is a 31 y.o. female.  Pt is a 31 yo female with a pmhx significant for obesity and left ovarian cyst.  She was seen in the ED on 6/19 for abd pain and was diagnosed with a 6 cm left ovarian cyst.  She did have an [place] then which showed no evidence of torsion.  The pt said she was fine until this afternoon.  She was at work when she suddenly developed terrible lower abd pain.  Pt was very uncomfortable when EMS arrived.  She was given 100 mcg fentanyl en route and pain has improved.  She denies any other associated sx.  Pt has an appt next week with obgyn.       Home Medications Prior to Admission medications   Medication Sig Start Date End Date Taking? Authorizing Provider  AZO BORIC ACID 600 MG SUPP Take 600 mg by mouth. 07/05/21   [provider]  fluconazole (DIFLUCAN) 150 MG tablet Take 1 tablet (150 mg total) by mouth every 3 (three) days as needed. 10/20/21   Raspet, Derry Skill, PA-C  naproxen (NAPROSYN) 500 MG tablet Take 1 tablet (500 mg total) by mouth 2 (two) times daily with a meal. 11/01/21   Jaynee Eagles, PA-C  albuterol (VENTOLIN HFA) 108 (90 Base) MCG/ACT inhaler Inhale 1-2 puffs into the lungs every 6 (six) hours as needed for wheezing or shortness of breath. Patient not taking: Reported on 04/14/2020 10/04/19 04/27/20  [name], FNP      Allergies    Amoxicillin and Penicillins    Review of Systems   Review of Systems  Gastrointestinal:  Positive for abdominal pain.  All other systems reviewed and are negative.   Physical Exam Updated Vital Signs BP 124/85   Pulse (!) 59   Temp 97.9 F (36.6 C) (Oral)   Resp 15   SpO2 98%  Physical Exam Vitals and nursing note reviewed.  Constitutional:       Appearance: Normal appearance. She is obese.  HENT:     Head: Normocephalic and atraumatic.     Right Ear: External ear normal.     Left Ear: External ear normal.     Nose: Nose normal.     Mouth/Throat:     Mouth: Mucous membranes are moist.     Pharynx: Oropharynx is clear.  Eyes:     Extraocular Movements: Extraocular movements intact.     Conjunctiva/sclera: Conjunctivae normal.     Pupils: Pupils are equal, round, and reactive to light.  Cardiovascular:     Rate and Rhythm: Normal rate and regular rhythm.     Pulses: Normal pulses.     Heart sounds: Normal heart sounds.  Pulmonary:     Effort: Pulmonary effort is normal.     Breath sounds: Normal breath sounds.  Abdominal:     General: Abdomen is flat. Bowel sounds are normal.     Palpations: Abdomen is soft.     Tenderness: There is abdominal tenderness in the left lower quadrant.  Musculoskeletal:        General: Normal range of motion.     Cervical back: Normal range of motion and neck supple.  Skin:    General: Skin is warm.  Capillary Refill: Capillary refill takes less than 2 seconds.  Neurological:     General: No focal deficit present.     Mental Status: She is alert and oriented to person, place, and time.  Psychiatric:        Mood and Affect: Mood normal.        Behavior: Behavior normal.        Thought Content: Thought content normal.        Judgment: Judgment normal.     ED Results / Procedures / Treatments   Labs (all labs ordered are listed, but only abnormal results are displayed) Labs Reviewed  CBC WITH DIFFERENTIAL/PLATELET  COMPREHENSIVE METABOLIC PANEL  LIPASE, BLOOD  URINALYSIS, ROUTINE W REFLEX MICROSCOPIC  I-STAT BETA HCG BLOOD, ED ([facility], [facility], [facility] ONLY)    EKG None  Radiology US PELVIC COMPLETE W TRANSVAGINAL AND TORSION R/O  Result Date: 11/01/2021 CLINICAL DATA:  Left ovarian cyst identified by CT, left pelvic and flank pain EXAM: TRANSABDOMINAL AND TRANSVAGINAL ULTRASOUND OF  PELVIS DOPPLER ULTRASOUND OF OVARIES TECHNIQUE: Both transabdominal and transvaginal ultrasound examinations of the pelvis were performed. Transabdominal technique was performed for global imaging of the pelvis including uterus, ovaries, adnexal regions, and pelvic cul-de-sac. It was necessary to proceed with endovaginal exam following the transabdominal exam to visualize the uterus, endometrium, ovaries, and adnexa. Color and duplex Doppler ultrasound was utilized to evaluate blood flow to the ovaries. COMPARISON:  None Available. FINDINGS: Uterus Measurements: 10.7 x 5.9 x 5.9 cm = volume: 197 mL. No fibroids or other mass visualized. Endometrium Thickness: 1.6 cm.  No focal abnormality visualized. Right ovary Measurements: 2.8 x 1.8 x 2.1 cm = volume: 6 mL. Normal appearance/no adnexal mass. Left ovary Measurements: 6.8 x 5.7 x 5.7 cm = volume: 116 mL. Left ovary is enlarged by a simple appearing cyst measuring 6.1 x 5.2 x 5.0 cm Pulsed Doppler evaluation of both ovaries demonstrates normal low-resistance arterial and venous waveforms. Other findings Trace free fluid in the low pelvis. IMPRESSION: 1. The left ovary is enlarged by a simple appearing cyst measuring 6.1 x 5.2 x 5.0 cm. Although most likely benign and functional, recommend follow-up US in 3-6 months. Note: This recommendation does not apply to premenarchal patients or to those with increased risk (genetic, family history, elevated tumor markers or other high-risk factors) of ovarian cancer. Reference: Radiology 2019 Nov; 293(2):359-371. 2. Arterial and venous Doppler flow is present to the bilateral ovaries. However, please note that intermittent or incomplete ovarian torsion can not be excluded by ultrasound in any enlarged ovary. 3.  No other ultrasound abnormality of the pelvis. Electronically Signed   By: Delanna Ahmadi M.Cisneros.   On: 11/01/2021 15:04    Procedures Procedures  {Document cardiac monitor, telemetry assessment procedure when  appropriate:1}  Medications Ordered in ED Medications  sodium chloride 0.9 % bolus 1,000 mL (has no administration in time range)  ondansetron (ZOFRAN) injection 4 mg (has no administration in time range)  morphine (PF) 4 MG/ML injection 4 mg (has no administration in time range)    ED Course/ Medical Decision Making/ A&P                           Medical Decision Making Amount and/or Complexity of Data Reviewed Labs: ordered. Radiology: ordered.  Risk Prescription drug management.   This patient presents to the ED for concern of abd pain, this involves an extensive number of treatment options, and is a  complaint that carries with it a high risk of complications and morbidity.  The differential diagnosis includes ovarian cyst torsion or rupture, infection   Co morbidities that complicate the patient evaluation  obesity and left ovarian cyst   Additional history obtained:  Additional history obtained from epic chart review External records from outside source obtained and reviewed including EMS report   Lab Tests:  I Ordered, and personally interpreted labs.  The pertinent results include:  ***   Imaging Studies ordered:  I ordered imaging studies including ***  I independently visualized and interpreted imaging which showed *** I agree with the radiologist interpretation   Cardiac Monitoring:  The patient was maintained on a cardiac monitor.  I personally viewed and interpreted the cardiac monitored which showed an underlying rhythm of: nsr   Medicines ordered and prescription drug management:  I ordered medication including morphine and zofran  for pain  Reevaluation of the patient after these medicines showed that the patient improved I have reviewed the patients home medicines and have made adjustments as needed   Test Considered:  ***   Critical Interventions:  ***   Consultations Obtained:  I requested consultation with the ***,  and  discussed lab and imaging findings as well as pertinent plan - they recommend: ***   Problem List / ED Course:  ***   Reevaluation:  After the interventions noted above, I reevaluated the patient and found that they have :{resolved/improved/worsened:23923::"improved"}   Social Determinants of Health:  ***   Dispostion:  After consideration of the diagnostic results and the patients response to treatment, I feel that the patent would benefit from ***.    {Document critical care time when appropriate:1} {Document review of labs and clinical decision tools ie heart score, Chads2Vasc2 etc:1}  {Document your independent review of radiology images, and any outside records:1} {Document your discussion with family members, caretakers, and with consultants:1} {Document social determinants of health affecting pt's care:1} {Document your decision making why or why not admission, treatments were needed:1} Final Clinical Impression(s) / ED Diagnoses Final diagnoses:  None    Rx / DC Orders ED Discharge Orders     None

## 2021-11-03 NOTE — ED Triage Notes (Signed)
Pt arrived from work via [employer]. Pt dx with polysystic ovarian cyst recently on left ovary. Pt states she feels a crushing, sharp abd pain.  10/10 pain initially  Received 100 mcg of fent.  Pain reduced pain to 4/10   Latest  VS  150/90  H90  RR30  SP 98  22 in RAC  no fluids given

## 2021-11-03 NOTE — ED Provider Notes (Signed)
  Physical Exam  BP 129/84   Pulse 68   Temp 98.1 F (36.7 C) (Oral)   Resp 15   SpO2 97%   Physical Exam  Procedures  Procedures  ED Course / MDM    Medical Decision Making Amount and/or Complexity of Data Reviewed Labs: ordered. Radiology: ordered.  Risk Prescription drug management.  Patient returns abdominal pain.  Received in signout.  Previous ovarian cyst.  Ultrasound repeated today for increased pain and did not visualize torsion but also did not feel a cyst anymore.  Pain is improved.  Cyst may have ruptured.  Feels better and will discharge home.       Davonna Belling, MD 11/03/21 [phone]

## 2021-11-09 ENCOUNTER — Other Ambulatory Visit: Payer: Self-pay

## 2021-11-09 ENCOUNTER — Encounter: Payer: Self-pay | Admitting: Family Medicine

## 2021-11-09 ENCOUNTER — Ambulatory Visit (INDEPENDENT_AMBULATORY_CARE_PROVIDER_SITE_OTHER): Payer: Medicaid Other | Admitting: Family Medicine

## 2021-11-09 VITALS — BP 123/85 | HR 90 | Ht 65.0 in | Wt 279.0 lb

## 2021-11-09 DIAGNOSIS — N83202 Unspecified ovarian cyst, left side: Secondary | ICD-10-CM

## 2021-11-09 DIAGNOSIS — Z5941 Food insecurity: Secondary | ICD-10-CM | POA: Diagnosis not present

## 2021-11-09 DIAGNOSIS — Z6841 Body Mass Index (BMI) 40.0 and over, adult: Secondary | ICD-10-CM

## 2021-11-09 DIAGNOSIS — A549 Gonococcal infection, unspecified: Secondary | ICD-10-CM | POA: Insufficient documentation

## 2021-11-09 DIAGNOSIS — N83209 Unspecified ovarian cyst, unspecified side: Secondary | ICD-10-CM | POA: Insufficient documentation

## 2021-11-09 NOTE — Assessment & Plan Note (Signed)
Discussed with patient that ovarian cysts are common and benign. Can control with OCP's but at present she does not want to be on medications and given only one episode I think this is reasonable. Follow up PRN for this issue.

## 2021-11-12 DIAGNOSIS — Z713 Dietary counseling and surveillance: Secondary | ICD-10-CM | POA: Diagnosis not present

## 2021-11-24 DIAGNOSIS — Z6841 Body Mass Index (BMI) 40.0 and over, adult: Secondary | ICD-10-CM | POA: Diagnosis not present

## 2021-11-24 DIAGNOSIS — F54 Psychological and behavioral factors associated with disorders or diseases classified elsewhere: Secondary | ICD-10-CM | POA: Diagnosis not present

## 2021-11-24 DIAGNOSIS — Z7189 Other specified counseling: Secondary | ICD-10-CM | POA: Diagnosis not present

## 2021-12-05 ENCOUNTER — Ambulatory Visit
Admission: RE | Admit: 2021-12-05 | Discharge: 2021-12-05 | Disposition: A | Discharge status: Home / Self Care | Payer: Medicaid Other | Source: Ambulatory Visit | Attending: Family Medicine | Admitting: Family Medicine

## 2021-12-05 VITALS — BP 110/77 | HR 83 | Temp 99.4°F | Resp 16

## 2021-12-05 DIAGNOSIS — R6883 Chills (without fever): Secondary | ICD-10-CM | POA: Diagnosis not present

## 2021-12-05 DIAGNOSIS — J029 Acute pharyngitis, unspecified: Secondary | ICD-10-CM | POA: Diagnosis not present

## 2021-12-05 LAB — POCT RAPID STREP A (OFFICE): Rapid Strep A Screen: NEGATIVE

## 2021-12-05 NOTE — ED Triage Notes (Signed)
Pt presents with sore throat, body aches, and chills that began yesterday.

## 2021-12-05 NOTE — ED Provider Notes (Signed)
[facility]    CSN: [id number] Arrival date & time: 12/05/21  0830      History   Chief Complaint Chief Complaint  Patient presents with   Sore Throat    I believe I have strep throat - Entered by patient   Chills    HPI Melanie Cisneros is a 31 y.o. female.   Presenting today with 1 day history of sore, swollen feeling throat, body aches, chills.  Thinks there may be a little bit of sinus pressure starting but no significant congestion, cough, chest pain, shortness of breath, abdominal pain, nausea vomiting or diarrhea.  So far trying Tylenol around-the-clock with mild temporary relief.  No known sick contacts recently.  No known pertinent chronic medical problems per patient.    Past Medical History:  Diagnosis Date   Bacterial vaginosis    Chlamydia    Gonorrhea    Obesity    UTI (lower urinary tract infection)     Patient Active Problem List   Diagnosis Date Noted   Gonorrhea 11/09/2021   Ovarian cyst 11/09/2021   Bacterial vaginosis 05/13/2020   BMI 45.0-49.9, adult ([place]) 06/06/2016   Psychosocial stressors 02/25/2016   Ptyalism 02/25/2016   Rubella non-immune status, antepartum 01/27/2016    Past Surgical History:  Procedure Laterality Date   WISDOM TOOTH EXTRACTION      OB History     Gravida  1   Para  1   Term  1   Preterm  0   AB  0   Living  1      SAB  0   IAB  0   Ectopic  0   Multiple  0   Live Births  1            Home Medications    Prior to Admission medications   Medication Sig Start Date End Date Taking? Authorizing Provider  Multiple Vitamins-Minerals (HAIR SKIN & NAILS ADVANCED) TABS Take 1 tablet by mouth daily. Patient not taking: Reported on 12/05/2021    [provider]  Multiple Vitamins-Minerals (MULTIVITAMIN ADULTS) TABS Take 1 tablet by mouth daily.    [provider]  naproxen (NAPROSYN) 500 MG tablet Take 1 tablet (500 mg total) by mouth 2 (two) times daily with a  meal. Patient not taking: Reported on 11/09/2021 11/01/21   Jaynee Eagles, PA-C  albuterol (VENTOLIN HFA) 108 (90 Base) MCG/ACT inhaler Inhale 1-2 puffs into the lungs every 6 (six) hours as needed for wheezing or shortness of breath. Patient not taking: Reported on 04/14/2020 10/04/19 04/27/20  [name], FNP    Family History Family History  Problem Relation Age of Onset   Diabetes Father    Hypertension Mother     Social History Social History   Tobacco Use   Smoking status: Never   Smokeless tobacco: Never  Vaping Use   Vaping Use: Never used  Substance Use Topics   Alcohol use: Yes    Comment: socially   Drug use: No     Allergies   Amoxicillin and Penicillins   Review of Systems Review of Systems Per HPI  Physical Exam Triage Vital Signs ED Triage Vitals [12/05/21 0836]  Enc Vitals Group     BP 110/77     Pulse Rate 83     Resp 16     Temp 99.4 F (37.4 C)     Temp Source Oral     SpO2 96 %  Weight      Height      Head Circumference      Peak Flow      Pain Score 8     Pain Loc      Pain Edu?      Excl. in [place]?    No data found.  Updated Vital Signs BP 110/77 (BP Location: Left Arm)   Pulse 83   Temp 99.4 F (37.4 C) (Oral)   Resp 16   LMP 11/07/2021 (Exact Date)   SpO2 96%   Visual Acuity Right Eye Distance:   Left Eye Distance:   Bilateral Distance:    Right Eye Near:   Left Eye Near:    Bilateral Near:     Physical Exam Vitals and nursing note reviewed.  Constitutional:      Appearance: Normal appearance.  HENT:     Head: Atraumatic.     Right Ear: Tympanic membrane and external ear normal.     Left Ear: Tympanic membrane and external ear normal.     Mouth/Throat:     Mouth: Mucous membranes are moist.     Pharynx: Posterior oropharyngeal erythema present.     Comments: Mild tonsillar erythema, edema bilaterally, uvula midline, oral airway patent Eyes:     Extraocular Movements: Extraocular movements intact.      Conjunctiva/sclera: Conjunctivae normal.  Cardiovascular:     Rate and Rhythm: Normal rate and regular rhythm.     Heart sounds: Normal heart sounds.  Pulmonary:     Effort: Pulmonary effort is normal.     Breath sounds: Normal breath sounds. No wheezing.  Musculoskeletal:        General: Normal range of motion.     Cervical back: Normal range of motion and neck supple.  Lymphadenopathy:     Cervical: No cervical adenopathy.  Skin:    General: Skin is warm and dry.  Neurological:     Mental Status: She is alert and oriented to person, place, and time.  Psychiatric:        Mood and Affect: Mood normal.        Thought Content: Thought content normal.      UC Treatments / Results  Labs (all labs ordered are listed, but only abnormal results are displayed) Labs Reviewed  CULTURE, GROUP A STREP ([facility])  COVID-19, FLU A+B NAA  POCT RAPID STREP A (OFFICE)    EKG   Radiology No results found.  Procedures Procedures (including critical care time)  Medications Ordered in UC Medications - No data to display  Initial Impression / Assessment and Plan / UC Course  I have reviewed the triage vital signs and the nursing notes.  Pertinent labs & imaging results that were available during my care of the patient were reviewed by me and considered in my medical decision making (see chart for details).     Rapid strep negative, throat culture and COVID and flu testing pending.  Discussed supportive over-the-counter medications, home care.  Work note given.  Return for worsening symptoms.  Final Clinical Impressions(s) / UC Diagnoses   Final diagnoses:  Sore throat  Chills   Discharge Instructions   None    ED Prescriptions   None    PDMP not reviewed this encounter.   Merrie Roof [place], [place] 12/05/21 [phone]

## 2021-12-06 DIAGNOSIS — Z713 Dietary counseling and surveillance: Secondary | ICD-10-CM | POA: Diagnosis not present

## 2021-12-07 LAB — CULTURE, GROUP A STREP (THRC)

## 2021-12-07 LAB — COVID-19, FLU A+B NAA
Influenza A, NAA: NOT DETECTED
Influenza B, NAA: NOT DETECTED
SARS-CoV-2, NAA: NOT DETECTED

## 2021-12-19 NOTE — Progress Notes (Signed)
Subjective:   Patient ID: Melanie Cisneros, female   DOB: 31 y.o.   MRN: [id number]   HPI Patient presents stating her right heel has started to hurt   ROS      Objective:  Physical Exam  Neurovascular status intact with discomfort now occurring in the right plantar fascia insertional point tendon calcaneus     Assessment:  Acute plantar fasciitis right     Plan:  Sterile prep reviewed condition with patient and injected the plantar fascia right 3 mg Kenalog 5 mg Xylocaine advised on support and shoe gear modifications reappoint as needed  X-rays were negative for signs of fracture or arthritis associated with condition

## 2021-12-24 ENCOUNTER — Ambulatory Visit: Payer: Medicaid Other | Admitting: Obstetrics & Gynecology

## 2022-01-03 DIAGNOSIS — Z880 Allergy status to penicillin: Secondary | ICD-10-CM | POA: Diagnosis not present

## 2022-01-03 DIAGNOSIS — Z79899 Other long term (current) drug therapy: Secondary | ICD-10-CM | POA: Diagnosis not present

## 2022-01-03 DIAGNOSIS — E785 Hyperlipidemia, unspecified: Secondary | ICD-10-CM | POA: Diagnosis not present

## 2022-01-03 DIAGNOSIS — Z6841 Body Mass Index (BMI) 40.0 and over, adult: Secondary | ICD-10-CM | POA: Diagnosis not present

## 2022-01-03 DIAGNOSIS — Z889 Allergy status to unspecified drugs, medicaments and biological substances status: Secondary | ICD-10-CM | POA: Diagnosis not present

## 2022-01-03 DIAGNOSIS — B9681 Helicobacter pylori [H. pylori] as the cause of diseases classified elsewhere: Secondary | ICD-10-CM | POA: Diagnosis not present

## 2022-01-03 DIAGNOSIS — K219 Gastro-esophageal reflux disease without esophagitis: Secondary | ICD-10-CM | POA: Diagnosis not present

## 2022-01-03 DIAGNOSIS — I10 Essential (primary) hypertension: Secondary | ICD-10-CM | POA: Diagnosis not present

## 2022-01-03 DIAGNOSIS — Z01818 Encounter for other preprocedural examination: Secondary | ICD-10-CM | POA: Diagnosis not present

## 2022-01-03 DIAGNOSIS — G4733 Obstructive sleep apnea (adult) (pediatric): Secondary | ICD-10-CM | POA: Diagnosis not present

## 2022-01-03 DIAGNOSIS — K2950 Unspecified chronic gastritis without bleeding: Secondary | ICD-10-CM | POA: Diagnosis not present

## 2022-01-04 DIAGNOSIS — B9681 Helicobacter pylori [H. pylori] as the cause of diseases classified elsewhere: Secondary | ICD-10-CM | POA: Diagnosis not present

## 2022-01-04 DIAGNOSIS — K2950 Unspecified chronic gastritis without bleeding: Secondary | ICD-10-CM | POA: Diagnosis not present

## 2022-01-11 DIAGNOSIS — Z136 Encounter for screening for cardiovascular disorders: Secondary | ICD-10-CM | POA: Diagnosis not present

## 2022-01-11 DIAGNOSIS — A048 Other specified bacterial intestinal infections: Secondary | ICD-10-CM | POA: Diagnosis not present

## 2022-01-11 DIAGNOSIS — E559 Vitamin D deficiency, unspecified: Secondary | ICD-10-CM | POA: Diagnosis not present

## 2022-01-11 DIAGNOSIS — Z01818 Encounter for other preprocedural examination: Secondary | ICD-10-CM | POA: Diagnosis not present

## 2022-01-11 DIAGNOSIS — Z6841 Body Mass Index (BMI) 40.0 and over, adult: Secondary | ICD-10-CM | POA: Diagnosis not present

## 2022-01-18 ENCOUNTER — Ambulatory Visit: Payer: Medicaid Other | Admitting: Podiatry

## 2022-01-18 DIAGNOSIS — B9681 Helicobacter pylori [H. pylori] as the cause of diseases classified elsewhere: Secondary | ICD-10-CM | POA: Diagnosis not present

## 2022-01-18 DIAGNOSIS — K2970 Gastritis, unspecified, without bleeding: Secondary | ICD-10-CM | POA: Diagnosis not present

## 2022-01-18 DIAGNOSIS — Z01818 Encounter for other preprocedural examination: Secondary | ICD-10-CM | POA: Diagnosis not present

## 2022-01-19 ENCOUNTER — Ambulatory Visit: Payer: Medicaid Other | Admitting: Neurology

## 2022-01-20 DIAGNOSIS — Z1152 Encounter for screening for COVID-19: Secondary | ICD-10-CM | POA: Diagnosis not present

## 2022-01-21 ENCOUNTER — Ambulatory Visit (HOSPITAL_COMMUNITY): Payer: Self-pay

## 2022-02-01 DIAGNOSIS — B9681 Helicobacter pylori [H. pylori] as the cause of diseases classified elsewhere: Secondary | ICD-10-CM | POA: Diagnosis not present

## 2022-02-01 DIAGNOSIS — K2970 Gastritis, unspecified, without bleeding: Secondary | ICD-10-CM | POA: Diagnosis not present

## 2022-02-02 DIAGNOSIS — Z881 Allergy status to other antibiotic agents status: Secondary | ICD-10-CM | POA: Diagnosis not present

## 2022-02-02 DIAGNOSIS — Z6841 Body Mass Index (BMI) 40.0 and over, adult: Secondary | ICD-10-CM | POA: Diagnosis not present

## 2022-02-02 DIAGNOSIS — Z880 Allergy status to penicillin: Secondary | ICD-10-CM | POA: Diagnosis not present

## 2022-02-02 DIAGNOSIS — K219 Gastro-esophageal reflux disease without esophagitis: Secondary | ICD-10-CM | POA: Diagnosis not present

## 2022-02-02 DIAGNOSIS — Z79899 Other long term (current) drug therapy: Secondary | ICD-10-CM | POA: Diagnosis not present

## 2022-02-04 DIAGNOSIS — Z9884 Bariatric surgery status: Secondary | ICD-10-CM | POA: Diagnosis not present

## 2022-02-04 DIAGNOSIS — R1110 Vomiting, unspecified: Secondary | ICD-10-CM | POA: Diagnosis not present

## 2022-02-05 DIAGNOSIS — R112 Nausea with vomiting, unspecified: Secondary | ICD-10-CM | POA: Diagnosis not present

## 2022-02-09 DIAGNOSIS — K3189 Other diseases of stomach and duodenum: Secondary | ICD-10-CM | POA: Diagnosis not present

## 2022-02-09 DIAGNOSIS — Z9884 Bariatric surgery status: Secondary | ICD-10-CM | POA: Diagnosis not present

## 2022-02-09 DIAGNOSIS — N179 Acute kidney failure, unspecified: Secondary | ICD-10-CM | POA: Diagnosis not present

## 2022-02-09 DIAGNOSIS — E876 Hypokalemia: Secondary | ICD-10-CM | POA: Diagnosis not present

## 2022-02-09 DIAGNOSIS — K2960 Other gastritis without bleeding: Secondary | ICD-10-CM | POA: Diagnosis not present

## 2022-02-09 DIAGNOSIS — Z79899 Other long term (current) drug therapy: Secondary | ICD-10-CM | POA: Diagnosis not present

## 2022-02-09 DIAGNOSIS — I9788 Other intraoperative complications of the circulatory system, not elsewhere classified: Secondary | ICD-10-CM | POA: Diagnosis not present

## 2022-02-09 DIAGNOSIS — R579 Shock, unspecified: Secondary | ICD-10-CM | POA: Diagnosis not present

## 2022-02-09 DIAGNOSIS — K439 Ventral hernia without obstruction or gangrene: Secondary | ICD-10-CM | POA: Diagnosis not present

## 2022-02-09 DIAGNOSIS — K311 Adult hypertrophic pyloric stenosis: Secondary | ICD-10-CM | POA: Diagnosis not present

## 2022-02-09 DIAGNOSIS — R112 Nausea with vomiting, unspecified: Secondary | ICD-10-CM | POA: Diagnosis not present

## 2022-02-09 DIAGNOSIS — E860 Dehydration: Secondary | ICD-10-CM | POA: Diagnosis not present

## 2022-02-09 DIAGNOSIS — B9681 Helicobacter pylori [H. pylori] as the cause of diseases classified elsewhere: Secondary | ICD-10-CM | POA: Diagnosis not present

## 2022-02-09 DIAGNOSIS — Z881 Allergy status to other antibiotic agents status: Secondary | ICD-10-CM | POA: Diagnosis not present

## 2022-02-09 DIAGNOSIS — Z91014 Allergy to mammalian meats: Secondary | ICD-10-CM | POA: Diagnosis not present

## 2022-02-09 DIAGNOSIS — Z880 Allergy status to penicillin: Secondary | ICD-10-CM | POA: Diagnosis not present

## 2022-02-09 DIAGNOSIS — E870 Hyperosmolality and hypernatremia: Secondary | ICD-10-CM | POA: Diagnosis not present

## 2022-02-09 DIAGNOSIS — Z9889 Other specified postprocedural states: Secondary | ICD-10-CM | POA: Diagnosis not present

## 2022-02-09 DIAGNOSIS — R079 Chest pain, unspecified: Secondary | ICD-10-CM | POA: Diagnosis not present

## 2022-02-09 DIAGNOSIS — Z713 Dietary counseling and surveillance: Secondary | ICD-10-CM | POA: Diagnosis not present

## 2022-02-09 DIAGNOSIS — Z6841 Body Mass Index (BMI) 40.0 and over, adult: Secondary | ICD-10-CM | POA: Diagnosis not present

## 2022-02-09 DIAGNOSIS — Z903 Acquired absence of stomach [part of]: Secondary | ICD-10-CM | POA: Diagnosis not present

## 2022-02-10 ENCOUNTER — Telehealth (INDEPENDENT_AMBULATORY_CARE_PROVIDER_SITE_OTHER): Payer: Self-pay | Admitting: Primary Care

## 2022-02-10 NOTE — Telephone Encounter (Signed)
Copied from [place] [id number]. Topic: Appointment Scheduling - Scheduling Inquiry for Clinic >> Feb 10, 2022  9:57 AM Erskine Squibb wrote: Reason for CRM: [facility] with [place] with [facility] called in stating the patient is being discharged form [facility] on 9/29. She is scheduled for a hospital follow up with her provider on 10/17 but she will have a video visit follow up within the first 7-10 days of being discharged with [facility]. Please assist patient further if necessary

## 2022-02-10 NOTE — Telephone Encounter (Signed)
Would you be able to contact pt and schedule a sooner HFU

## 2022-02-11 DIAGNOSIS — Z452 Encounter for adjustment and management of vascular access device: Secondary | ICD-10-CM | POA: Diagnosis not present

## 2022-02-14 DIAGNOSIS — T8119XA Other postprocedural shock, initial encounter: Secondary | ICD-10-CM | POA: Diagnosis not present

## 2022-02-14 DIAGNOSIS — Z903 Acquired absence of stomach [part of]: Secondary | ICD-10-CM | POA: Diagnosis not present

## 2022-02-14 DIAGNOSIS — R000 Tachycardia, unspecified: Secondary | ICD-10-CM | POA: Diagnosis not present

## 2022-02-14 DIAGNOSIS — K219 Gastro-esophageal reflux disease without esophagitis: Secondary | ICD-10-CM | POA: Diagnosis not present

## 2022-02-14 DIAGNOSIS — K311 Adult hypertrophic pyloric stenosis: Secondary | ICD-10-CM | POA: Diagnosis not present

## 2022-02-14 DIAGNOSIS — E860 Dehydration: Secondary | ICD-10-CM | POA: Diagnosis not present

## 2022-02-14 DIAGNOSIS — Z9884 Bariatric surgery status: Secondary | ICD-10-CM | POA: Diagnosis not present

## 2022-02-14 DIAGNOSIS — Z6841 Body Mass Index (BMI) 40.0 and over, adult: Secondary | ICD-10-CM | POA: Diagnosis not present

## 2022-02-14 DIAGNOSIS — E870 Hyperosmolality and hypernatremia: Secondary | ICD-10-CM | POA: Diagnosis not present

## 2022-02-14 DIAGNOSIS — N179 Acute kidney failure, unspecified: Secondary | ICD-10-CM | POA: Diagnosis not present

## 2022-02-15 DIAGNOSIS — N179 Acute kidney failure, unspecified: Secondary | ICD-10-CM | POA: Diagnosis not present

## 2022-02-15 DIAGNOSIS — T8119XA Other postprocedural shock, initial encounter: Secondary | ICD-10-CM | POA: Diagnosis not present

## 2022-02-15 DIAGNOSIS — K311 Adult hypertrophic pyloric stenosis: Secondary | ICD-10-CM | POA: Diagnosis not present

## 2022-02-15 DIAGNOSIS — E860 Dehydration: Secondary | ICD-10-CM | POA: Diagnosis not present

## 2022-02-18 NOTE — Progress Notes (Deleted)
Initial neurology clinic note  Melanie Cisneros MRN: [id number] DOB: 1991/02/06  Referring provider: Terrilee Croak, PA-C  Primary care provider: Kerin Perna, NP  Reason for consult:  right arm and hand numbness and tingling  Subjective:  This is [name], a 31 y.o. ***-handed female with a medical history of *** who presents to neurology clinic with ***. The patient is accompanied by ***.  ***  Patient was seen at the ED on 10/20/21. While there, patient mentioned right arm and hand numbness and tingling. She asked for neurology referral which is why patient was sent to neurology. ***  MEDICATIONS:  Outpatient Encounter Medications as of 02/23/2022  Medication Sig   Multiple Vitamins-Minerals (HAIR SKIN & NAILS ADVANCED) TABS Take 1 tablet by mouth daily. (Patient not taking: Reported on 12/05/2021)   Multiple Vitamins-Minerals (MULTIVITAMIN ADULTS) TABS Take 1 tablet by mouth daily.   naproxen (NAPROSYN) 500 MG tablet Take 1 tablet (500 mg total) by mouth 2 (two) times daily with a meal. (Patient not taking: Reported on 11/09/2021)   [DISCONTINUED] albuterol (VENTOLIN HFA) 108 (90 Base) MCG/ACT inhaler Inhale 1-2 puffs into the lungs every 6 (six) hours as needed for wheezing or shortness of breath. (Patient not taking: Reported on 04/14/2020)   No facility-administered encounter medications on file as of 02/23/2022.    PAST MEDICAL HISTORY: Past Medical History:  Diagnosis Date   Bacterial vaginosis    Chlamydia    Gonorrhea    Obesity    UTI (lower urinary tract infection)     PAST SURGICAL HISTORY: Past Surgical History:  Procedure Laterality Date   WISDOM TOOTH EXTRACTION      ALLERGIES: Allergies  Allergen Reactions   Amoxicillin Anaphylaxis, Hives and Other (See Comments)    Has patient had a PCN reaction causing immediate rash, facial/tongue/throat swelling, SOB or lightheadedness with hypotension: Yes Has patient had a PCN reaction  causing severe rash involving mucus membranes or skin necrosis: No Has patient had a PCN reaction that required hospitalization No Has patient had a PCN reaction occurring within the last 10 years: No If all of the above answers are "NO", then may proceed with Cephalosporin use.   Penicillins Anaphylaxis, Hives and Other (See Comments)    Has patient had a PCN reaction causing immediate rash, facial/tongue/throat swelling, SOB or lightheadedness with hypotension: Yes Has patient had a PCN reaction causing severe rash involving mucus membranes or skin necrosis: No Has patient had a PCN reaction that required hospitalization No Has patient had a PCN reaction occurring within the last 10 years: No If all of the above answers are "NO", then may proceed with Cephalosporin use.    FAMILY HISTORY: Family History  Problem Relation Age of Onset   Diabetes Father    Hypertension Mother     SOCIAL HISTORY: Social History   Tobacco Use   Smoking status: Never   Smokeless tobacco: Never  Vaping Use   Vaping Use: Never used  Substance Use Topics   Alcohol use: Yes    Comment: socially   Drug use: No   Social History   Social History Narrative   Not on file    Objective:  Vital Signs:  There were no vitals taken for this visit.  ***  Labs and Imaging review: Internal labs: No results found for: "HGBA1C" No results found for: "VITAMINB12" No results found for: "TSH" No results found for: "ESRSEDRATE", "POCTSEDRATE" ***  External labs: *** BMP significant for  elevated glucose CBC significant for thrombocytopenia (101) and leukocytosis (20.5) on 02/15/22  Imaging: ***  Assessment/Plan:  Melanie Cisneros is a 31 y.o. female who presents for evaluation of ***. *** has a relevant medical history of ***. *** neurological examination is pertinent for ***. Available diagnostic data is significant for ***. This constellation of symptoms and objective data would most likely  localize to ***. ***  PLAN: -Blood work: *** ***  -Return to clinic ***  The impression above as well as the plan as outlined below were extensively discussed with the patient (in the company of ***) who voiced understanding. All questions were answered to their satisfaction.  The patient was counseled on pertinent fall precautions per the printed material provided today, and as noted under the "Patient Instructions" section below.***  When available, results of the above investigations and possible further recommendations will be communicated to the patient via telephone/MyChart. Patient to call office if not contacted after expected testing turnaround time.   Total time spent reviewing records, interview, history/exam, documentation, and coordination of care on day of encounter:  *** min   Thank you for allowing me to participate in patient's care.  If I can answer any additional questions, I would be pleased to do so.  Kai Levins, MD   CC: Kerin Perna, NP [id number] [id number]  CC: Referring provider: Terrilee Croak, PA-C [address] [place],  [place] [address]

## 2022-02-23 ENCOUNTER — Ambulatory Visit: Payer: Medicaid Other | Admitting: Neurology

## 2022-02-28 ENCOUNTER — Encounter: Payer: Self-pay | Admitting: Obstetrics & Gynecology

## 2022-02-28 ENCOUNTER — Other Ambulatory Visit (HOSPITAL_COMMUNITY)
Admission: RE | Admit: 2022-02-28 | Discharge: 2022-02-28 | Disposition: A | Discharge status: Home / Self Care | Payer: Medicaid Other | Source: Ambulatory Visit | Attending: Obstetrics & Gynecology | Admitting: Obstetrics & Gynecology

## 2022-02-28 ENCOUNTER — Ambulatory Visit (INDEPENDENT_AMBULATORY_CARE_PROVIDER_SITE_OTHER): Payer: Medicaid Other | Admitting: Obstetrics & Gynecology

## 2022-02-28 ENCOUNTER — Other Ambulatory Visit: Payer: Self-pay

## 2022-02-28 VITALS — BP 110/80 | HR 85 | Wt 243.7 lb

## 2022-02-28 DIAGNOSIS — N898 Other specified noninflammatory disorders of vagina: Secondary | ICD-10-CM | POA: Insufficient documentation

## 2022-02-28 DIAGNOSIS — B3731 Acute candidiasis of vulva and vagina: Secondary | ICD-10-CM

## 2022-02-28 DIAGNOSIS — Z01419 Encounter for gynecological examination (general) (routine) without abnormal findings: Secondary | ICD-10-CM

## 2022-02-28 NOTE — Patient Instructions (Signed)

## 2022-02-28 NOTE — Progress Notes (Signed)
GYNECOLOGY ANNUAL PREVENTATIVE CARE ENCOUNTER NOTE  History:     Melanie Cisneros is a 31 y.o. G31P1001 female here for a routine annual gynecologic exam.  Current complaints: worried about yeast infection due to abnormal discharge for a few days.   Denies abnormal vaginal bleeding, pelvic pain, problems with intercourse or other gynecologic concerns.    Gynecologic History Patient's last menstrual period was 01/30/2022 (exact date). Contraception: abstinence Last Pap: 02/2022. Result was normal with negative HPV  Obstetric History OB History  Gravida Para Term Preterm AB Living  '1 1 1 '$ 0 0 1  SAB IAB Ectopic Multiple Live Births  0 0 0 0 1    # Outcome Date GA Lbr Len/2nd Weight Sex Delivery Anes PTL Lv  1 Term 06/13/16 [id number] / 00:29 8 lb 10.5 oz (3.925 kg) F Vag-Vacuum EPI  LIV    Past Medical History:  Diagnosis Date   Bacterial vaginosis    Chlamydia    Gonorrhea    Obesity    UTI (lower urinary tract infection)     Past Surgical History:  Procedure Laterality Date   WISDOM TOOTH EXTRACTION      Current Outpatient Medications on File Prior to Visit  Medication Sig Dispense Refill   metoprolol tartrate (LOPRESSOR) 25 MG tablet Take by mouth.     Multiple Vitamins-Minerals (MULTIVITAMIN ADULTS) TABS Take 1 tablet by mouth daily.     enoxaparin (LOVENOX) 40 MG/0.4ML injection Inject into the skin. (Patient not taking: Reported on 02/28/2022)     omeprazole (PRILOSEC) 40 MG capsule Take by mouth. (Patient not taking: Reported on 02/28/2022)     ondansetron (ZOFRAN-ODT) 8 MG disintegrating tablet Take 8 mg by mouth every 8 (eight) hours as needed. (Patient not taking: Reported on 02/28/2022)     promethazine (PHENERGAN) 25 MG suppository Place 25 mg rectally every 6 (six) hours as needed. (Patient not taking: Reported on 02/28/2022)     ursodiol (ACTIGALL) 300 MG capsule Take by mouth. (Patient not taking: Reported on 02/28/2022)     [DISCONTINUED] albuterol  (VENTOLIN HFA) 108 (90 Base) MCG/ACT inhaler Inhale 1-2 puffs into the lungs every 6 (six) hours as needed for wheezing or shortness of breath. (Patient not taking: Reported on 04/14/2020) 18 g 0   No current facility-administered medications on file prior to visit.    Allergies  Allergen Reactions   Amoxicillin Anaphylaxis, Hives and Other (See Comments)    Has patient had a PCN reaction causing immediate rash, facial/tongue/throat swelling, SOB or lightheadedness with hypotension: Yes Has patient had a PCN reaction causing severe rash involving mucus membranes or skin necrosis: No Has patient had a PCN reaction that required hospitalization No Has patient had a PCN reaction occurring within the last 10 years: No If all of the above answers are "NO", then may proceed with Cephalosporin use.   Penicillins Anaphylaxis, Hives and Other (See Comments)    Has patient had a PCN reaction causing immediate rash, facial/tongue/throat swelling, SOB or lightheadedness with hypotension: Yes Has patient had a PCN reaction causing severe rash involving mucus membranes or skin necrosis: No Has patient had a PCN reaction that required hospitalization No Has patient had a PCN reaction occurring within the last 10 years: No If all of the above answers are "NO", then may proceed with Cephalosporin use.   Pork Allergy Other (See Comments)    Pt has tolerated multiple doses of heparin    Social History:  reports that she has  never smoked. She has never used smokeless tobacco. She reports current alcohol use. She reports that she does not use drugs.  Family History  Problem Relation Age of Onset   Diabetes Father    Hypertension Mother     The following portions of the patient's history were reviewed and updated as appropriate: allergies, current medications, past family history, past medical history, past social history, past surgical history and problem list.  Review of Systems Pertinent items noted  in HPI and remainder of comprehensive ROS otherwise negative.  Physical Exam:  BP 110/80   Pulse 85   Wt 243 lb 11.2 oz (110.5 kg)   LMP 01/30/2022 (Exact Date)   BMI 40.55 kg/m  CONSTITUTIONAL: Well-developed, well-nourished female in no acute distress.  HENT:  Normocephalic, atraumatic, External right and left ear normal.  EYES: Conjunctivae and EOM are normal. Pupils are equal, round, and reactive to light. No scleral icterus.  NECK: Normal range of motion, supple, no masses.  Normal thyroid.  SKIN: Skin is warm and dry. No rash noted. Not diaphoretic. No erythema. No pallor. MUSCULOSKELETAL: Normal range of motion. No tenderness.  No cyanosis, clubbing, or edema. NEUROLOGIC: Alert and oriented to person, place, and time. Normal reflexes, muscle tone coordination.  PSYCHIATRIC: Normal mood and affect. Normal behavior. Normal judgment and thought content. CARDIOVASCULAR: Normal heart rate noted, regular rhythm RESPIRATORY: Clear to auscultation bilaterally. Effort and breath sounds normal, no problems with respiration noted. BREASTS: Symmetric in size. No masses, tenderness, skin changes, nipple drainage, or lymphadenopathy bilaterally. Performed in the presence of a chaperone. ABDOMEN: Soft, no distention noted.  No tenderness, rebound or guarding.  PELVIC: Normal appearing external genitalia and urethral meatus; normal appearing vaginal mucosa and cervix.  Yellow vaginal discharge noted, testing sample obtained.  Pap smear obtained.  Normal uterine size, no other palpable masses, no uterine or adnexal tenderness.  Performed in the presence of a chaperone.   Assessment and Plan:     1. Vaginal discharge - Cervicovaginal ancillary only done, will follow up results and manage accordingly.  2. Well woman exam with routine gynecological exam - Cytology - PAP( [facility]) Will follow up results of pap smear and manage accordingly. Routine preventative health maintenance measures  emphasized. Please refer to After Visit Summary for other counseling recommendations.      Verita Schneiders, MD, [place] for [employer] [facility] [employer], [place]

## 2022-03-01 ENCOUNTER — Inpatient Hospital Stay (INDEPENDENT_AMBULATORY_CARE_PROVIDER_SITE_OTHER): Payer: Medicaid Other | Admitting: Primary Care

## 2022-03-01 LAB — CERVICOVAGINAL ANCILLARY ONLY
Bacterial Vaginitis (gardnerella): NEGATIVE
Candida Glabrata: NEGATIVE
Candida Vaginitis: POSITIVE — AB
Comment: NEGATIVE
Comment: NEGATIVE
Comment: NEGATIVE

## 2022-03-01 MED ORDER — FLUCONAZOLE 150 MG PO TABS: 150.0000 mg | ORAL_TABLET | Freq: Once | ORAL | 0 refills | Status: AC

## 2022-03-01 NOTE — Addendum Note (Signed)
Addended by: Verita Schneiders A on: 03/01/2022 04:37 PM   Modules accepted: Orders

## 2022-03-02 LAB — CYTOLOGY - PAP
Comment: NEGATIVE
Diagnosis: NEGATIVE
High risk HPV: NEGATIVE

## 2022-03-03 ENCOUNTER — Emergency Department (HOSPITAL_COMMUNITY)
Admission: EM | Admit: 2022-03-03 | Discharge: 2022-03-03 | Disposition: A | Discharge status: Home / Self Care | Payer: Medicaid Other | Attending: Emergency Medicine | Admitting: Emergency Medicine

## 2022-03-03 ENCOUNTER — Encounter (HOSPITAL_COMMUNITY): Payer: Self-pay

## 2022-03-03 ENCOUNTER — Emergency Department (HOSPITAL_COMMUNITY): Payer: Medicaid Other

## 2022-03-03 ENCOUNTER — Other Ambulatory Visit: Payer: Self-pay

## 2022-03-03 DIAGNOSIS — R109 Unspecified abdominal pain: Secondary | ICD-10-CM | POA: Diagnosis not present

## 2022-03-03 DIAGNOSIS — E876 Hypokalemia: Secondary | ICD-10-CM | POA: Diagnosis not present

## 2022-03-03 DIAGNOSIS — N3000 Acute cystitis without hematuria: Secondary | ICD-10-CM

## 2022-03-03 DIAGNOSIS — R252 Cramp and spasm: Secondary | ICD-10-CM | POA: Insufficient documentation

## 2022-03-03 DIAGNOSIS — R1012 Left upper quadrant pain: Secondary | ICD-10-CM | POA: Diagnosis present

## 2022-03-03 DIAGNOSIS — Z79899 Other long term (current) drug therapy: Secondary | ICD-10-CM | POA: Diagnosis not present

## 2022-03-03 DIAGNOSIS — R1032 Left lower quadrant pain: Secondary | ICD-10-CM | POA: Diagnosis not present

## 2022-03-03 DIAGNOSIS — R112 Nausea with vomiting, unspecified: Secondary | ICD-10-CM | POA: Diagnosis not present

## 2022-03-03 LAB — CBC WITH DIFFERENTIAL/PLATELET
Abs Immature Granulocytes: 0.02 10*3/uL (ref 0.00–0.07)
Basophils Absolute: 0.1 10*3/uL (ref 0.0–0.1)
Basophils Relative: 1 %
Eosinophils Absolute: 0.1 10*3/uL (ref 0.0–0.5)
Eosinophils Relative: 2 %
HCT: 43.4 % (ref 36.0–46.0)
Hemoglobin: 14.3 g/dL (ref 12.0–15.0)
Immature Granulocytes: 0 %
Lymphocytes Relative: 25 %
Lymphs Abs: 2.1 10*3/uL (ref 0.7–4.0)
MCH: 27.3 pg (ref 26.0–34.0)
MCHC: 32.9 g/dL (ref 30.0–36.0)
MCV: 82.8 fL (ref 80.0–100.0)
Monocytes Absolute: 0.9 10*3/uL (ref 0.1–1.0)
Monocytes Relative: 11 %
Neutro Abs: 5.1 10*3/uL (ref 1.7–7.7)
Neutrophils Relative %: 61 %
Platelets: 435 10*3/uL — ABNORMAL HIGH (ref 150–400)
RBC: 5.24 MIL/uL — ABNORMAL HIGH (ref 3.87–5.11)
RDW: 13.9 % (ref 11.5–15.5)
WBC: 8.2 10*3/uL (ref 4.0–10.5)
nRBC: 0 % (ref 0.0–0.2)

## 2022-03-03 LAB — URINALYSIS, ROUTINE W REFLEX MICROSCOPIC
Bilirubin Urine: NEGATIVE
Glucose, UA: NEGATIVE mg/dL
Ketones, ur: 80 mg/dL — AB
Nitrite: NEGATIVE
Protein, ur: 30 mg/dL — AB
Specific Gravity, Urine: 1.014 (ref 1.005–1.030)
Squamous Epithelial / HPF: >50 — ABNORMAL HIGH (ref 0–5)
WBC, UA: >50 WBC/hpf — ABNORMAL HIGH (ref 0–5)
pH: 5 (ref 5.0–8.0)

## 2022-03-03 LAB — COMPREHENSIVE METABOLIC PANEL
ALT: 33 U/L (ref 0–44)
AST: 30 U/L (ref 15–41)
Albumin: 3.9 g/dL (ref 3.5–5.0)
Alkaline Phosphatase: 71 U/L (ref 38–126)
Anion gap: 16 — ABNORMAL HIGH (ref 5–15)
BUN: <5 mg/dL — ABNORMAL LOW (ref 6–20)
CO2: 25 mmol/L (ref 22–32)
Calcium: 9.1 mg/dL (ref 8.9–10.3)
Chloride: 98 mmol/L (ref 98–111)
Creatinine, Ser: 0.62 mg/dL (ref 0.44–1.00)
GFR, Estimated: >60 mL/min (ref >60)
Glucose, Bld: 73 mg/dL (ref 70–99)
Potassium: 3.2 mmol/L — ABNORMAL LOW (ref 3.5–5.1)
Sodium: 139 mmol/L (ref 135–145)
Total Bilirubin: 1.2 mg/dL (ref 0.3–1.2)
Total Protein: 7.5 g/dL (ref 6.5–8.1)

## 2022-03-03 LAB — I-STAT BETA HCG BLOOD, ED (MC, WL, AP ONLY): I-stat hCG, quantitative: <5 m[IU]/mL (ref <5)

## 2022-03-03 LAB — LIPASE, BLOOD: Lipase: 33 U/L (ref 11–51)

## 2022-03-03 MED ORDER — FOSFOMYCIN TROMETHAMINE 3 G PO PACK
3.0000 g | PACK | Freq: Once | ORAL | Status: AC
  Administered 2022-03-03: 3 g via ORAL
  Filled 2022-03-03: qty 3

## 2022-03-03 MED ORDER — POTASSIUM CHLORIDE 10 MEQ/100ML IV SOLN
10.0000 meq | Freq: Once | INTRAVENOUS | Status: AC
  Administered 2022-03-03: 10 meq via INTRAVENOUS
  Filled 2022-03-03: qty 100

## 2022-03-03 MED ORDER — SULFAMETHOXAZOLE-TRIMETHOPRIM 800-160 MG PO TABS
1.0000 | ORAL_TABLET | Freq: Once | ORAL | Status: DC
  Filled 2022-03-03: qty 1

## 2022-03-03 MED ORDER — IOHEXOL 300 MG/ML  SOLN
100.0000 mL | Freq: Once | INTRAMUSCULAR | Status: AC | PRN
  Administered 2022-03-03: 100 mL via INTRAVENOUS

## 2022-03-03 NOTE — ED Provider Triage Note (Signed)
Emergency Medicine Provider Triage Evaluation Note  ALENCIA GORDON , a 31 y.o. female  was evaluated in triage.  Pt complains of postop problems.  Patient reports having gastric bypass operation on 22 September.  Patient reports that this operation was "botched" and they had to revise the operation on 2 October.  The patient reports ever since surgery initially in September she has had intermittent right-sided groin pain along with inability to tolerate solids.  The patient states that for the last 2 days she has had left-sided flank pain that is nonradiating, not associated with urinary symptoms.  The patient also states that she has had "tightness" in her legs but also has been noted to be hyponatremic and hyponatremic in the past.  Patient reports his bilateral leg "tightness".  Patient denies history of blood clots, chest pain, shortness of breath.  Patient endorsing sporadic fevers ever since operation.  Patient also endorsing nausea and vomiting.  Patient reports that her last successful bowel movement was on Tuesday and before that she had not had a bowel movement since 22 September.  Review of Systems  Positive:  Negative:   Physical Exam  BP 121/82 (BP Location: Left Arm)   Pulse 78   Temp 97.7 F (36.5 C) (Oral)   Resp 18   Ht '5\' 5"'$  (1.651 m)   Wt 106.1 kg   LMP 01/30/2022 (Exact Date)   SpO2 96%   BMI 38.94 kg/m  Gen:   Awake, no distress   Resp:  Normal effort  MSK:   Moves extremities without difficulty  Other:    Medical Decision Making  Medically screening exam initiated at 12:19 PM.  Appropriate orders placed.  Asianna D Verrilli was informed that the remainder of the evaluation will be completed by another provider, this initial triage assessment does not replace that evaluation, and the importance of remaining in the ED until their evaluation is complete.     Azucena Cecil, PA-C 03/03/22 1220

## 2022-03-03 NOTE — Discharge Instructions (Addendum)
Please follow-up with the surgeon who performed your procedure for another office visit if you continue having pain or issues with your eating or bowel movements.  We gave you an antibiotic in the ER as a one-time treatment for urine infection.  We talked about your low potassium level.  Your potassium is 3.2.  If you can drink 2-3 Gatorade's or sports drinks with electrolytes daily, this will help replenish your potassium.

## 2022-03-03 NOTE — ED Triage Notes (Signed)
Pt reports intermittent left flank pain x1 day with nausea/vomiting. Denies urinary s/sy.  Pt reports recent bariatric surgery 10/2.

## 2022-03-03 NOTE — ED Provider Notes (Signed)
[facility] DEPT Provider Note   CSN: [id number] Arrival date & time: 03/03/22  1050     History  Chief Complaint  Patient presents with   Flank Pain    Melanie Cisneros is a 31 y.o. 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  The patient reports primarily she is concerned about sharp intermittent pains across her abdomen, mostly in the left upper quadrant, which she has fell yesterday into today.  She also says she has trouble to have a bowel movement for the better part of the month, although she had a normal bowel movement yesterday.  She denies persistent vomiting.  She has been having difficulty with pured diet after her weight loss surgery and has been drinking mostly Gatorade and water.  She reports she has had cramping pain in both of her upper legs on and off since her surgery.   HPI     Home Medications Prior to Admission medications   Medication Sig Start Date End Date Taking? Authorizing Provider  enoxaparin (LOVENOX) 40 MG/0.4ML injection Inject into the skin. Patient not taking: Reported on 02/28/2022 02/16/22   [provider]  metoprolol tartrate (LOPRESSOR) 25 MG tablet Take by mouth. 02/16/22   [provider]  Multiple Vitamins-Minerals (MULTIVITAMIN ADULTS) TABS Take 1 tablet by mouth daily.    [provider]  omeprazole (PRILOSEC) 40 MG capsule Take by mouth. Patient not taking: Reported on 02/28/2022 01/19/22   [provider]  ondansetron (ZOFRAN-ODT) 8 MG disintegrating tablet Take 8 mg by mouth every 8 (eight) hours as needed. Patient not taking: Reported on 02/28/2022 02/08/22   [provider]  promethazine (PHENERGAN) 25 MG suppository Place 25 mg rectally every 6 (six) hours as needed. Patient not taking: Reported on 02/28/2022 02/11/22   [provider]  ursodiol (ACTIGALL) 300 MG capsule Take by mouth. Patient not taking: Reported on 02/28/2022 01/19/22   [provider]  albuterol (VENTOLIN HFA) 108 (90 Base) MCG/ACT inhaler Inhale 1-2 puffs into the lungs every 6 (six) hours as needed for wheezing or shortness of breath. Patient not taking: Reported on 04/14/2020 10/04/19 04/27/20  [name], FNP      Allergies    Amoxicillin, Penicillins, and Pork allergy    Review of Systems   Review of Systems  Physical Exam Updated Vital Signs BP 134/76   Pulse 77   Temp 98 F (36.7 C)   Resp 18   Ht '5\' 5"'$  (1.651 m)   Wt 106.1 kg   LMP 01/30/2022 (Exact Date)   SpO2 100%   BMI 38.94 kg/m  Physical Exam Constitutional:      General: She is not in acute distress. HENT:     Head: Normocephalic and atraumatic.  Eyes:     Conjunctiva/sclera: Conjunctivae normal.     Pupils: Pupils are equal, round, and reactive to light.  Cardiovascular:     Rate and Rhythm: Normal rate and regular rhythm.     Pulses: Normal pulses.  Pulmonary:     Effort: Pulmonary effort is normal. No respiratory distress.  Abdominal:     General: There is no distension.     Tenderness: There is no abdominal tenderness.  Skin:    General: Skin is warm and dry.  Neurological:     General: No focal deficit present.     Mental Status:  She is alert and oriented to person, place, and time. Mental status is at baseline.  Psychiatric:        Mood and Affect: Mood normal.        Behavior: Behavior normal.     ED Results / Procedures / Treatments   Labs (all labs ordered are listed, but only abnormal results are displayed) Labs Reviewed  CBC WITH DIFFERENTIAL/PLATELET - Abnormal; Notable for the following components:      Result Value   RBC 5.24 (*)    Platelets 435 (*)    All other components within normal limits  COMPREHENSIVE METABOLIC PANEL - Abnormal; Notable for the following components:   Potassium 3.2 (*)    BUN <5 (*)     Anion gap 16 (*)    All other components within normal limits  URINALYSIS, ROUTINE W REFLEX MICROSCOPIC - Abnormal; Notable for the following components:   APPearance CLOUDY (*)    Hgb urine dipstick SMALL (*)    Ketones, ur 80 (*)    Protein, ur 30 (*)    Leukocytes,Ua LARGE (*)    WBC, UA >50 (*)    Bacteria, UA MANY (*)    Squamous Epithelial / LPF >50 (*)    All other components within normal limits  URINE CULTURE  LIPASE, BLOOD  I-STAT BETA HCG BLOOD, ED ([facility], [facility], [facility] ONLY)    EKG None  Radiology CT Abdomen Pelvis W Contrast  Result Date: 03/03/2022 CLINICAL DATA:  One day history of intermittent left flank pain with nausea and vomiting. Recent bariatric surgery on 02/14/2022 EXAM: CT ABDOMEN AND PELVIS WITH CONTRAST TECHNIQUE: Multidetector CT imaging of the abdomen and pelvis was performed using the standard protocol following bolus administration of intravenous contrast. RADIATION DOSE REDUCTION: This exam was performed according to the departmental dose-optimization program which includes automated exposure control, adjustment of the mA and/or kV according to patient size and/or use of iterative reconstruction technique. CONTRAST:  137m OMNIPAQUE IOHEXOL 300 MG/ML  SOLN COMPARISON:  CT abdomen and pelvis dated 11/01/2021 FINDINGS: Lower chest: No focal consolidation or pulmonary nodule in the lung bases. No pleural effusion or pneumothorax demonstrated. Partially imaged heart size is normal. Hepatobiliary: No focal hepatic lesions. No intra or extrahepatic biliary ductal dilation. Normal gallbladder. Pancreas: No focal lesions or main ductal dilation. Spleen: Normal in size without focal abnormality. Adrenals/Urinary Tract: No adrenal nodules. No focal renal lesions, calculi or hydronephrosis. No focal bladder wall thickening. 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 Mild stranding surrounding the gastrojejunostomy site. No  evidence of bowel wall thickening, distention, or inflammatory changes. Colonic diverticulosis without acute diverticulitis. Normal appendix. Vascular/Lymphatic: No significant vascular findings are present. No enlarged abdominal or pelvic lymph nodes. Reproductive: No adnexal masses. Other: Trace pneumoperitoneum. Musculoskeletal: No acute or abnormal lytic or blastic osseous lesions. Incidentally noted pseudoarthrosis left L5-S1. Postsurgical changes of the anterior abdominal wall. Small fat-containing paraumbilical hernia. IMPRESSION: 1. 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 Findings likely represent expected postsurgical changes. 2. Colonic diverticulosis without acute diverticulitis. Electronically Signed   By: LDarrin NipperM.D.   On: 03/03/2022 18:53    Procedures Procedures    Medications Ordered in ED Medications  potassium chloride 10 mEq in 100 mL IVPB (0 mEq Intravenous Stopped 03/03/22 1832)  iohexol (OMNIPAQUE) 300 MG/ML solution 100 mL (100 mLs Intravenous Contrast Given 03/03/22 1823)  fosfomycin (MONUROL) packet 3 g (3 g Oral  Given 03/03/22 1940)    ED Course/ Medical Decision Making/ A&P                           Medical Decision Making Amount and/or Complexity of Data Reviewed Labs: ordered.  Risk Prescription drug management.   This patient presents to the ED with concern for abdominal pain, now 2 to 3 weeks postoperative for lysis of adhesions and gastric bypass. This involves an extensive number of treatment options, and is a complaint that carries with it a high risk of complications and morbidity.  The differential diagnosis includes postoperative complication including infection versus bowel obstruction versus ileus versus other  Co-morbidities that complicate the patient evaluation: History of recent operation at high risk for postoperative  complication  External records from outside source obtained and reviewed including records from OSH regarding operative procedure as noted above  I ordered and personally interpreted labs.  The pertinent results include: Potassium 3.2.  Kidney function within normal limits.  CBC unremarkable.  Lipase unremarkable.  I ordered imaging studies including CT abdomen pelvis with contrast I independently visualized and interpreted imaging which showed bladder wall thickening, no other acute abnormalities to explain the patient's symptoms, she has some postsurgical changes and small inflammation which would be expected after recent procedure. I agree with the radiologist interpretation  The patient was maintained on a cardiac monitor.  I personally viewed and interpreted the cardiac monitored which showed an underlying rhythm of: NSR  I ordered medication including IV potassium infusion as patient was concerned about ability to swallow oral potassium tablets which are large.  Fosfomycin was ordered as a single dose for UTI  I have reviewed the patients home medicines and have made adjustments as needed  Test Considered: I have a lower overall clinical suspicion for bilateral upper leg DVTs of this presentation; given that it is symmetrical in the upper legs  Overall her clinical presentation is benign, her lab work does not indicate significant infection.  Do not believe she needs an emergent surgery consultation here.  I recommended she follow-up with her prior surgeons.  After the interventions noted above, I reevaluated the patient and found that they have: improved  Dispostion:  After consideration of the diagnostic results and the patients response to treatment, I feel that the patent would benefit from outpatient f/u         Final Clinical Impression(s) / ED Diagnoses Final diagnoses:  Acute cystitis without hematuria  Abdominal pain, unspecified abdominal location  Hypokalemia     Rx / DC Orders ED Discharge Orders     None         Wyvonnia Dusky, MD 03/03/22 2309

## 2022-03-05 LAB — URINE CULTURE

## 2022-03-08 ENCOUNTER — Ambulatory Visit (HOSPITAL_COMMUNITY): Payer: Self-pay

## 2022-03-08 DIAGNOSIS — R110 Nausea: Secondary | ICD-10-CM | POA: Diagnosis not present

## 2022-03-08 DIAGNOSIS — Z9884 Bariatric surgery status: Secondary | ICD-10-CM | POA: Diagnosis not present

## 2022-03-09 ENCOUNTER — Other Ambulatory Visit: Payer: Self-pay

## 2022-03-09 ENCOUNTER — Emergency Department (HOSPITAL_COMMUNITY)
Admission: EM | Admit: 2022-03-09 | Discharge: 2022-03-10 | Disposition: A | Discharge status: Home / Self Care | Payer: Medicaid Other | Attending: Emergency Medicine | Admitting: Emergency Medicine

## 2022-03-09 DIAGNOSIS — R1013 Epigastric pain: Secondary | ICD-10-CM | POA: Diagnosis not present

## 2022-03-09 DIAGNOSIS — M7989 Other specified soft tissue disorders: Secondary | ICD-10-CM | POA: Insufficient documentation

## 2022-03-09 DIAGNOSIS — Z79899 Other long term (current) drug therapy: Secondary | ICD-10-CM | POA: Insufficient documentation

## 2022-03-09 DIAGNOSIS — E876 Hypokalemia: Secondary | ICD-10-CM | POA: Diagnosis not present

## 2022-03-09 DIAGNOSIS — E860 Dehydration: Secondary | ICD-10-CM | POA: Diagnosis not present

## 2022-03-09 DIAGNOSIS — R112 Nausea with vomiting, unspecified: Secondary | ICD-10-CM | POA: Insufficient documentation

## 2022-03-09 DIAGNOSIS — D72829 Elevated white blood cell count, unspecified: Secondary | ICD-10-CM | POA: Insufficient documentation

## 2022-03-09 DIAGNOSIS — K828 Other specified diseases of gallbladder: Secondary | ICD-10-CM | POA: Diagnosis not present

## 2022-03-09 DIAGNOSIS — G4489 Other headache syndrome: Secondary | ICD-10-CM | POA: Diagnosis not present

## 2022-03-09 DIAGNOSIS — K3189 Other diseases of stomach and duodenum: Secondary | ICD-10-CM | POA: Diagnosis not present

## 2022-03-09 DIAGNOSIS — K5730 Diverticulosis of large intestine without perforation or abscess without bleeding: Secondary | ICD-10-CM | POA: Diagnosis not present

## 2022-03-09 DIAGNOSIS — R531 Weakness: Secondary | ICD-10-CM | POA: Diagnosis not present

## 2022-03-09 DIAGNOSIS — R1111 Vomiting without nausea: Secondary | ICD-10-CM | POA: Diagnosis not present

## 2022-03-09 DIAGNOSIS — R1110 Vomiting, unspecified: Secondary | ICD-10-CM | POA: Diagnosis present

## 2022-03-09 DIAGNOSIS — Z9884 Bariatric surgery status: Secondary | ICD-10-CM | POA: Diagnosis not present

## 2022-03-09 LAB — URINALYSIS, ROUTINE W REFLEX MICROSCOPIC
Bilirubin Urine: NEGATIVE
Glucose, UA: NEGATIVE mg/dL
Hgb urine dipstick: NEGATIVE
Ketones, ur: 80 mg/dL — AB
Nitrite: NEGATIVE
Protein, ur: 30 mg/dL — AB
Specific Gravity, Urine: 1.018 (ref 1.005–1.030)
pH: 5 (ref 5.0–8.0)

## 2022-03-09 LAB — COMPREHENSIVE METABOLIC PANEL
ALT: 29 U/L (ref 0–44)
AST: 26 U/L (ref 15–41)
Albumin: 3.8 g/dL (ref 3.5–5.0)
Alkaline Phosphatase: 62 U/L (ref 38–126)
Anion gap: 16 — ABNORMAL HIGH (ref 5–15)
BUN: 8 mg/dL (ref 6–20)
CO2: 19 mmol/L — ABNORMAL LOW (ref 22–32)
Calcium: 9.1 mg/dL (ref 8.9–10.3)
Chloride: 110 mmol/L (ref 98–111)
Creatinine, Ser: 0.83 mg/dL (ref 0.44–1.00)
GFR, Estimated: >60 mL/min (ref >60)
Glucose, Bld: 86 mg/dL (ref 70–99)
Potassium: 3.3 mmol/L — ABNORMAL LOW (ref 3.5–5.1)
Sodium: 145 mmol/L (ref 135–145)
Total Bilirubin: 1 mg/dL (ref 0.3–1.2)
Total Protein: 6.9 g/dL (ref 6.5–8.1)

## 2022-03-09 LAB — CBC WITH DIFFERENTIAL/PLATELET
Abs Immature Granulocytes: 0.07 10*3/uL (ref 0.00–0.07)
Basophils Absolute: 0 10*3/uL (ref 0.0–0.1)
Basophils Relative: 0 %
Eosinophils Absolute: 0 10*3/uL (ref 0.0–0.5)
Eosinophils Relative: 0 %
HCT: 40.8 % (ref 36.0–46.0)
Hemoglobin: 13.5 g/dL (ref 12.0–15.0)
Immature Granulocytes: 0 %
Lymphocytes Relative: 13 %
Lymphs Abs: 2.2 10*3/uL (ref 0.7–4.0)
MCH: 27.7 pg (ref 26.0–34.0)
MCHC: 33.1 g/dL (ref 30.0–36.0)
MCV: 83.8 fL (ref 80.0–100.0)
Monocytes Absolute: 2 10*3/uL — ABNORMAL HIGH (ref 0.1–1.0)
Monocytes Relative: 11 %
Neutro Abs: 12.8 10*3/uL — ABNORMAL HIGH (ref 1.7–7.7)
Neutrophils Relative %: 76 %
Platelets: 258 10*3/uL (ref 150–400)
RBC: 4.87 MIL/uL (ref 3.87–5.11)
RDW: 14.5 % (ref 11.5–15.5)
WBC: 17.1 10*3/uL — ABNORMAL HIGH (ref 4.0–10.5)
nRBC: 0 % (ref 0.0–0.2)

## 2022-03-09 LAB — PREGNANCY, URINE: Preg Test, Ur: NEGATIVE

## 2022-03-09 LAB — LIPASE, BLOOD: Lipase: 35 U/L (ref 11–51)

## 2022-03-09 MED ORDER — SODIUM CHLORIDE 0.9 % IV BOLUS
1000.0000 mL | Freq: Once | INTRAVENOUS | Status: AC
  Administered 2022-03-09: 1000 mL via INTRAVENOUS

## 2022-03-09 MED ORDER — ONDANSETRON HCL 4 MG/2ML IJ SOLN
4.0000 mg | Freq: Once | INTRAMUSCULAR | Status: AC
  Administered 2022-03-09: 4 mg via INTRAVENOUS
  Filled 2022-03-09: qty 2

## 2022-03-09 MED ORDER — SODIUM CHLORIDE 0.9 % IV SOLN
12.5000 mg | Freq: Four times a day (QID) | INTRAVENOUS | Status: DC | PRN
  Administered 2022-03-10: 12.5 mg via INTRAVENOUS
  Filled 2022-03-09: qty 12.5

## 2022-03-09 MED ORDER — DIPHENHYDRAMINE HCL 50 MG/ML IJ SOLN
25.0000 mg | Freq: Once | INTRAMUSCULAR | Status: AC
  Administered 2022-03-09: 25 mg via INTRAVENOUS
  Filled 2022-03-09: qty 1

## 2022-03-09 MED ORDER — IOHEXOL 9 MG/ML PO SOLN
ORAL | Status: AC
  Administered 2022-03-09: 500 mL via ORAL
  Filled 2022-03-09: qty 1000

## 2022-03-09 NOTE — ED Provider Notes (Signed)
[facility] DEPT Provider Note   CSN: [id number] Arrival date & time: 03/09/22  2017     History {Add pertinent medical, surgical, social history, OB history to HPI:1} Chief Complaint  Patient presents with  . Emesis    Jalaya D Minetti is a 31 y.o. female.   Emesis   31 year old female presents emergency department with complaints of emesis.  Patient states that she's had decreased p.o. intake ever since she had surgery performed on 02/14/2022.  She states she did okay on initial week of liquid only diet but states when she tried to transition to pured type foods, she has been able to keep nothing down.  She reports being able to minimally sip on sugar-free Gatorade but no other liquid or food.  She was seen the emergency department here on 03/03/2022 and was given IV fluid hydration.  She states that temporarily helped her symptoms.  Since discharge, she has had persistent emesis that is nonbloody in appearance.  Decreased p.o. intake.  She reports generalized weakness.  She reports some abdominal pain that is baseline since abdominal surgery.  Patient also reports having a urinary tract infection upon prior visit that she has not been able to take medication at home.  Denies fever, chills, night sweats, chest pain, shortness of breath, vaginal symptoms, change in bowel habits.  She was seen by [facility] yesterday and scheduled for TPN this Friday; they were unable to obtain IV access for fluid administration then.  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  Home Medications Prior to Admission medications   Medication Sig Start Date End Date Taking? Authorizing Provider  enoxaparin (LOVENOX) 40 MG/0.4ML injection Inject into the skin. Patient not taking: Reported on 02/28/2022 02/16/22   [provider]  metoprolol tartrate (LOPRESSOR) 25 MG tablet Take by  mouth. 02/16/22   [provider]  Multiple Vitamins-Minerals (MULTIVITAMIN ADULTS) TABS Take 1 tablet by mouth daily.    [provider]  omeprazole (PRILOSEC) 40 MG capsule Take by mouth. Patient not taking: Reported on 02/28/2022 01/19/22   [provider]  ondansetron (ZOFRAN-ODT) 8 MG disintegrating tablet Take 8 mg by mouth every 8 (eight) hours as needed. Patient not taking: Reported on 02/28/2022 02/08/22   [provider]  promethazine (PHENERGAN) 25 MG suppository Place 25 mg rectally every 6 (six) hours as needed. Patient not taking: Reported on 02/28/2022 02/11/22   [provider]  ursodiol (ACTIGALL) 300 MG capsule Take by mouth. Patient not taking: Reported on 02/28/2022 01/19/22   [provider]  albuterol (VENTOLIN HFA) 108 (90 Base) MCG/ACT inhaler Inhale 1-2 puffs into the lungs every 6 (six) hours as needed for wheezing or shortness of breath. Patient not taking: Reported on 04/14/2020 10/04/19 04/27/20  [name], FNP      Allergies    Amoxicillin, Penicillins, and Pork allergy    Review of Systems   Review of Systems  Gastrointestinal:  Positive for vomiting.  All other systems reviewed and are negative.   Physical Exam Updated Vital Signs BP 108/80   Pulse 88   Temp 98.1 F (36.7 C)   Resp 18   LMP 01/30/2022 (Exact Date)   SpO2 99%  Physical Exam Vitals and nursing note reviewed.  Constitutional:      General: She is not in acute distress.    Appearance: She is well-developed.  HENT:     Head: Normocephalic and atraumatic.  Eyes:     Conjunctiva/sclera: Conjunctivae normal.  Cardiovascular:     Rate and Rhythm: Normal rate and regular rhythm.     Heart sounds: No murmur heard. Pulmonary:     Effort: Pulmonary effort is normal. No respiratory distress.     Breath sounds: Normal breath sounds. No wheezing or rales.  Abdominal:     Palpations: Abdomen is soft.     Tenderness: There is no  abdominal tenderness.  Musculoskeletal:     Cervical back: Neck supple. No rigidity or tenderness.     Comments: Lower extremity swelling without pitting edema.  Patient states this has been baseline since surgery.  Skin:    General: Skin is warm and dry.     Capillary Refill: Capillary refill takes less than 2 seconds.  Neurological:     Mental Status: She is alert.  Psychiatric:        Mood and Affect: Mood normal.    ED Results / Procedures / Treatments   Labs (all labs ordered are listed, but only abnormal results are displayed) Labs Reviewed  COMPREHENSIVE METABOLIC PANEL  CBC WITH DIFFERENTIAL/PLATELET  LIPASE, BLOOD  URINALYSIS, ROUTINE W REFLEX MICROSCOPIC  PREGNANCY, URINE    EKG None  Radiology No results found.  Procedures Procedures  {Document cardiac monitor, telemetry assessment procedure when appropriate:1}  Medications Ordered in ED Medications  sodium chloride 0.9 % bolus 1,000 mL (1,000 mLs Intravenous New Bag/Given 03/09/22 2047)  ondansetron (ZOFRAN) injection 4 mg (4 mg Intravenous Given 03/09/22 2047)    ED Course/ Medical Decision Making/ A&P Clinical Course as of 03/09/22 2052  Wed Mar 09, 2022  2040 Pureed food last and this week. Tolerating liquids before that [CR]    Clinical Course User Index [CR] Wilnette Kales, PA                           Medical Decision Making Amount and/or Complexity of Data Reviewed Labs: ordered.  Risk Prescription drug management.   This patient presents to the ED for concern of ***, this involves an extensive number of treatment options, and is a complaint that carries with it a high risk of complications and morbidity.  The differential diagnosis includes ***   Co morbidities that complicate the patient evaluation  See HPI   Additional history obtained:  Additional history obtained from EMR External records from outside source obtained and reviewed including ***   Lab Tests:  I Ordered,  and personally interpreted labs.  The pertinent results include:  ***   Imaging Studies ordered:  I ordered imaging studies including ***  I independently visualized and interpreted imaging which showed *** I agree with the radiologist interpretation   Cardiac Monitoring: / EKG:  The patient was maintained on a cardiac monitor.  I personally viewed and interpreted the cardiac monitored which showed an underlying rhythm of: ***   Consultations Obtained:  I requested consultation with the ***,  and discussed lab and imaging findings as well as pertinent plan - they recommend: ***   Problem List / ED Course / Critical interventions / Medication management  *** I ordered medication including ***  for ***  Reevaluation of the patient after these medicines showed that the patient {resolved/improved/worsened:23923::"improved"} I have reviewed the patients home medicines and have made adjustments as needed   Social Determinants of Health:  ***   Test / Admission - Considered:  Vitals signs significant for ***. Otherwise within normal  range and stable throughout visit. Laboratory/imaging studies significant for: *** *** Worrisome signs and symptoms were discussed with the patient, and the patient acknowledged understanding to return to the ED if noticed. Patient was stable upon discharge.    {Document critical care time when appropriate:1} {Document review of labs and clinical decision tools ie heart score, Chads2Vasc2 etc:1}  {Document your independent review of radiology images, and any outside records:1} {Document your discussion with family members, caretakers, and with consultants:1} {Document social determinants of health affecting pt's care:1} {Document your decision making why or why not admission, treatments were needed:1} Final Clinical Impression(s) / ED Diagnoses Final diagnoses:  None    Rx / DC Orders ED Discharge Orders     None

## 2022-03-09 NOTE — ED Triage Notes (Signed)
BIB [facility] for nausea/vomiting. Pt has a history of gastric bypass sx earlier this month, has not been able to keep food or drionk down

## 2022-03-09 NOTE — ED Provider Notes (Signed)
  Physical Exam  BP 122/68   Pulse 63   Temp 98.3 F (36.8 C) (Oral)   Resp 15   LMP 01/30/2022 (Exact Date) Comment: negative urie pregnancy test 03/10/22  SpO2 97%   Physical Exam  Procedures  Procedures  ED Course / MDM   Clinical Course as of 03/10/22 0531  Wed Mar 09, 2022  2040 Pureed food last and this week. Tolerating liquids before that [CR]  Thu Mar 10, 2022  0241 Image discussed with Dr. Yehuda Mao, radiologist, after further discussion fever that findings initially reported as extraluminal air are likely within small bowel loops or remaining pouches the stomach.  Suspicion for perforation on imaging is exceptionally low, particularly given clinically she has very little abdominal pain.  I appreciate his collaboration in the care of this patient. [RS]    Clinical Course User Index [CR] Wilnette Kales, PA [RS] Yvonne Kendall Gypsy Balsam, PA-C   Medical Decision Making Amount and/or Complexity of Data Reviewed Labs: ordered.    Details:  CBC with leukocytosis of 17,000, increased from WBC of 8 6 days ago, CMP with hypokalemia 3.3, mild elevated anion gap to 16, low CO2 to 19.  Lipase is normal, patient is not pregnant.  UA with ketonuria proteinuria and many bacteria. Radiology: ordered.    Details: CT with postoperative changes, but no evidence of acute infection, clinical concern for perforation is low and after discussing imaging with radiologist, do not feel imaging supports etiology of perforation.  Risk Prescription drug management.    Care of this patient assumed from preceding ED provider, Dion Saucier, PA-C at time of shift change.  Please see his associated note for further insight and the patient's ED course.  In brief patient is a 31 year old female who Underwent sleeve gastrectomy on 02/02/2022.  Had a revision surgery in 02/14/2022.  Unfortunately patient still not tolerating p.o. since that time.  Scheduled for PICC line for TPN on 10/27.  At time of shift  change patient is awaiting CT of the abdomen and pelvis.  She does have more nausea medication ordered prior to oral contrast.  Patient refused to drink oral contrast for CT imaging.  Patient tolerating liquids at this time, feels her nausea is improved.  Was given dose of IV antibiotics in ED for UTI and has oral antibiotics to take at home.  Recommendation is as prescribed for the entire course with outpatient follow-up.  Also recommend close follow-up with her surgeon as scheduled for tomorrow for PICC line and TPN as well as further management of her intractable nausea and vomiting at home.  Melanie Cisneros voiced understanding of her medical evaluation and treatment plan. Each of their questions answered to their expressed satisfaction.  Return precautions were given.  Patient is well-appearing, stable, and was discharged in good condition.  This chart was dictated using voice recognition software, Dragon. Despite the best efforts of this provider to proofread and correct errors, errors may still occur which can change documentation meaning.      Emeline Darling, PA-C 03/10/22 0532    Lennice Sites, DO 03/10/22 1622

## 2022-03-10 ENCOUNTER — Telehealth: Payer: Self-pay

## 2022-03-10 ENCOUNTER — Emergency Department (HOSPITAL_COMMUNITY): Payer: Medicaid Other

## 2022-03-10 DIAGNOSIS — D72829 Elevated white blood cell count, unspecified: Secondary | ICD-10-CM | POA: Diagnosis not present

## 2022-03-10 DIAGNOSIS — R1084 Generalized abdominal pain: Secondary | ICD-10-CM | POA: Diagnosis not present

## 2022-03-10 DIAGNOSIS — R112 Nausea with vomiting, unspecified: Secondary | ICD-10-CM | POA: Diagnosis not present

## 2022-03-10 DIAGNOSIS — Z79899 Other long term (current) drug therapy: Secondary | ICD-10-CM | POA: Diagnosis not present

## 2022-03-10 DIAGNOSIS — K828 Other specified diseases of gallbladder: Secondary | ICD-10-CM | POA: Diagnosis not present

## 2022-03-10 DIAGNOSIS — Z6838 Body mass index (BMI) 38.0-38.9, adult: Secondary | ICD-10-CM | POA: Diagnosis not present

## 2022-03-10 DIAGNOSIS — Z888 Allergy status to other drugs, medicaments and biological substances status: Secondary | ICD-10-CM | POA: Diagnosis not present

## 2022-03-10 DIAGNOSIS — E860 Dehydration: Secondary | ICD-10-CM | POA: Diagnosis not present

## 2022-03-10 DIAGNOSIS — R109 Unspecified abdominal pain: Secondary | ICD-10-CM | POA: Diagnosis not present

## 2022-03-10 DIAGNOSIS — K5730 Diverticulosis of large intestine without perforation or abscess without bleeding: Secondary | ICD-10-CM | POA: Diagnosis not present

## 2022-03-10 DIAGNOSIS — R1013 Epigastric pain: Secondary | ICD-10-CM | POA: Diagnosis not present

## 2022-03-10 DIAGNOSIS — Z9884 Bariatric surgery status: Secondary | ICD-10-CM | POA: Diagnosis not present

## 2022-03-10 DIAGNOSIS — M7989 Other specified soft tissue disorders: Secondary | ICD-10-CM | POA: Diagnosis not present

## 2022-03-10 DIAGNOSIS — N390 Urinary tract infection, site not specified: Secondary | ICD-10-CM | POA: Diagnosis not present

## 2022-03-10 DIAGNOSIS — K3189 Other diseases of stomach and duodenum: Secondary | ICD-10-CM | POA: Diagnosis not present

## 2022-03-10 DIAGNOSIS — Z880 Allergy status to penicillin: Secondary | ICD-10-CM | POA: Diagnosis not present

## 2022-03-10 DIAGNOSIS — Z91014 Allergy to mammalian meats: Secondary | ICD-10-CM | POA: Diagnosis not present

## 2022-03-10 DIAGNOSIS — E876 Hypokalemia: Secondary | ICD-10-CM | POA: Diagnosis not present

## 2022-03-10 MED ORDER — SODIUM CHLORIDE 0.9 % IV SOLN
1.0000 g | Freq: Once | INTRAVENOUS | Status: AC
  Administered 2022-03-10: 1 g via INTRAVENOUS
  Filled 2022-03-10: qty 10

## 2022-03-10 MED ORDER — IOHEXOL 300 MG/ML  SOLN
100.0000 mL | Freq: Once | INTRAMUSCULAR | Status: AC | PRN
  Administered 2022-03-10: 100 mL via INTRAVENOUS

## 2022-03-10 MED ORDER — LACTATED RINGERS IV BOLUS
1000.0000 mL | Freq: Once | INTRAVENOUS | Status: AC
  Administered 2022-03-10: 1000 mL via INTRAVENOUS

## 2022-03-10 MED ORDER — SODIUM CHLORIDE (PF) 0.9 % IJ SOLN
INTRAMUSCULAR | Status: AC
  Filled 2022-03-10: qty 50

## 2022-03-10 NOTE — Discharge Instructions (Addendum)
You are seen in the ER today for your nausea vomiting and dehydration.  Work-up overall is reassuring you were given fluids here IV.  You are also given a dose of IV antibiotics for urinary tract infection.  Please take the antibiotics prescribed to you in the outpatient setting for the entire course.  Please follow-up with your surgery team as scheduled for tomorrow and return to the ER with any severe symptoms.

## 2022-03-10 NOTE — ED Notes (Signed)
Patient resting comfortably

## 2022-03-10 NOTE — Patient Outreach (Signed)
Care Coordination  03/10/2022  MISSEY HASLEY 01-27-1991 [id number]   Transition Care Management Follow-up Telephone Call Date of discharge and from where: 03/10/22 [facility] How have you been since you were released from the hospital? Feel a little better after eating. The doctor said nothing came back in the CT Scan, but the doctor in [place] told me to come to the ED there, and I am going there today Any questions or concerns? Yes  Items Reviewed: Did the pt receive and understand the discharge instructions provided? Yes  Medications obtained and verified? No  Other? No  Any new allergies since your discharge? No  Dietary orders reviewed? Yes Do you have support at home? Yes   Home Care and Equipment/Supplies: Were home health services ordered? not applicable If so, what is the name of the agency?  Has the agency set up a time to come to the patient's home? not applicable Were any new equipment or medical supplies ordered?  No What is the name of the medical supply agency?  Were you able to get the supplies/equipment? not applicable Do you have any questions related to the use of the equipment or supplies? No  Functional Questionnaire: (I = Independent and D = Dependent) ADLs: I  Bathing/Dressing- I  Meal Prep- I  Eating- I  Maintaining continence- I  Transferring/Ambulation- I  Managing Meds- I  Follow up appointments reviewed:  PCP Hospital f/u appt confirmed? No  Scheduled to see Specialist Hospital f/u appt confirmed? No  Scheduled to see  on  Are transportation arrangements needed? No  If their condition worsens, is the pt aware to call PCP or go to the Emergency Dept.? Yes Was the patient provided with contact information for the PCP's office or ED? Yes Was to pt encouraged to call back with questions or concerns? Yes Mickel Fuchs, BSW, [place] Managed Medicaid Team  [phone]

## 2022-03-11 DIAGNOSIS — Z452 Encounter for adjustment and management of vascular access device: Secondary | ICD-10-CM | POA: Diagnosis not present

## 2022-03-14 ENCOUNTER — Telehealth: Payer: Self-pay | Admitting: *Deleted

## 2022-03-14 NOTE — Patient Outreach (Signed)
Care Coordination  03/14/2022  VERONCIA JEZEK 04-06-1991 [id number]   Transition Care Management Unsuccessful Follow-up Telephone Call  Date of discharge and from where:  03/12/22 from [facility]  Attempts:  1st Attempt  Reason for unsuccessful TCM follow-up call:  Unable to leave message   Lurena Joiner RN, [place] RN Care Coordinator

## 2022-03-15 ENCOUNTER — Telehealth: Payer: Self-pay | Admitting: *Deleted

## 2022-03-15 NOTE — Patient Outreach (Signed)
Care Coordination  03/15/2022  TELESA JEANCHARLES 1991-04-17 [id number]   Transition Care Management Unsuccessful Follow-up Telephone Call  Date of discharge and from where:  03/12/22 from [facility]  Attempts:  2nd Attempt  Reason for unsuccessful TCM follow-up call:  No answer/busy   Lurena Joiner RN, [place] RN Care Coordinator

## 2022-03-29 ENCOUNTER — Other Ambulatory Visit: Payer: Self-pay

## 2022-03-29 ENCOUNTER — Encounter (HOSPITAL_COMMUNITY): Payer: Self-pay | Admitting: Emergency Medicine

## 2022-03-29 ENCOUNTER — Emergency Department (HOSPITAL_COMMUNITY)
Admission: EM | Admit: 2022-03-29 | Discharge: 2022-03-30 | Discharge status: Against Medical Advice | Payer: Medicaid Other | Attending: Emergency Medicine | Admitting: Emergency Medicine

## 2022-03-29 DIAGNOSIS — Z5321 Procedure and treatment not carried out due to patient leaving prior to being seen by health care provider: Secondary | ICD-10-CM | POA: Insufficient documentation

## 2022-03-29 DIAGNOSIS — R531 Weakness: Secondary | ICD-10-CM | POA: Diagnosis not present

## 2022-03-29 DIAGNOSIS — R000 Tachycardia, unspecified: Secondary | ICD-10-CM | POA: Diagnosis not present

## 2022-03-29 DIAGNOSIS — R55 Syncope and collapse: Secondary | ICD-10-CM | POA: Diagnosis not present

## 2022-03-29 NOTE — ED Triage Notes (Signed)
Pt BIB EMS from home with c/o near syncope. Pt states that she was standing and felt like she was going to pass out.

## 2022-03-30 ENCOUNTER — Ambulatory Visit
Admission: RE | Admit: 2022-03-30 | Discharge: 2022-03-30 | Disposition: A | Discharge status: Home / Self Care | Payer: Medicaid Other | Source: Ambulatory Visit | Attending: Urgent Care | Admitting: Urgent Care

## 2022-03-30 VITALS — BP 120/89 | HR 84 | Temp 97.9°F | Resp 16

## 2022-03-30 DIAGNOSIS — R5383 Other fatigue: Secondary | ICD-10-CM | POA: Insufficient documentation

## 2022-03-30 DIAGNOSIS — R82998 Other abnormal findings in urine: Secondary | ICD-10-CM | POA: Diagnosis not present

## 2022-03-30 DIAGNOSIS — R5381 Other malaise: Secondary | ICD-10-CM

## 2022-03-30 DIAGNOSIS — R55 Syncope and collapse: Secondary | ICD-10-CM

## 2022-03-30 DIAGNOSIS — R519 Headache, unspecified: Secondary | ICD-10-CM

## 2022-03-30 DIAGNOSIS — N898 Other specified noninflammatory disorders of vagina: Secondary | ICD-10-CM

## 2022-03-30 MED ORDER — ACETAMINOPHEN 325 MG PO TABS: 650.0000 mg | ORAL_TABLET | Freq: Four times a day (QID) | ORAL | 0 refills | Status: DC | PRN

## 2022-03-30 NOTE — ED Triage Notes (Signed)
Pt c/o near syncopal episode last night-alslo c/o HA started yesterday-states she was taken to [facility] ED by [facility] due to wait-NAD-steady gait

## 2022-03-30 NOTE — ED Provider Notes (Signed)
[facility] URG[facility]  Note:  This document was prepared using Systems analyst and may include unintentional dictation errors.  MRN: [id number] DOB: [birth year]  Subjective:   Melanie Cisneros is a 31 y.o. female presenting for multiple concerns.  Patient had a near syncope event last night.  Symptoms started out with feeling weak, dizzy.  She did not pass out or collapse.  She was able to get to the couch and sat herself down.  She has also had persistent difficulty with dark urine, decreased appetite, nausea and vomiting.  She believes that this is related to her recent gastric bypass surgery.  Has had a difficult time ever since then.  Has been using Zofran and when that did not work she got a prescription for promethazine suppositories.  She still experiences intermittent difficulty with nausea, vomiting, decreased appetite.  She does have follow-up with her gastric bypass surgeon.  No current facility-administered medications for this encounter.  Current Outpatient Medications:    enoxaparin (LOVENOX) 40 MG/0.4ML injection, Inject into the skin. (Patient not taking: Reported on 03/09/2022), Disp: , Rfl:    levofloxacin (LEVAQUIN) 250 MG tablet, Take 250 mg by mouth daily., Disp: , Rfl:    metoprolol tartrate (LOPRESSOR) 25 MG tablet, Take by mouth., Disp: , Rfl:    Multiple Vitamins-Minerals (MULTIVITAMIN ADULTS) TABS, Take 1 tablet by mouth daily., Disp: , Rfl:    omeprazole (PRILOSEC) 40 MG capsule, Take by mouth., Disp: , Rfl:    ondansetron (ZOFRAN-ODT) 8 MG disintegrating tablet, Take 8 mg by mouth every 8 (eight) hours as needed., Disp: , Rfl:    promethazine (PHENERGAN) 25 MG suppository, Place 25 mg rectally every 6 (six) hours as needed. (Patient not taking: Reported on 02/28/2022), Disp: , Rfl:    ursodiol (ACTIGALL) 300 MG capsule, Take by mouth., Disp: , Rfl:    Allergies  Allergen Reactions   Amoxicillin Anaphylaxis, Hives and Other (See  Comments)    Has patient had a PCN reaction causing immediate rash, facial/tongue/throat swelling, SOB or lightheadedness with hypotension: Yes Has patient had a PCN reaction causing severe rash involving mucus membranes or skin necrosis: No Has patient had a PCN reaction that required hospitalization No Has patient had a PCN reaction occurring within the last 10 years: No If all of the above answers are "NO", then may proceed with Cephalosporin use.   Penicillins Anaphylaxis, Hives and Other (See Comments)    Has patient had a PCN reaction causing immediate rash, facial/tongue/throat swelling, SOB or lightheadedness with hypotension: Yes Has patient had a PCN reaction causing severe rash involving mucus membranes or skin necrosis: No Has patient had a PCN reaction that required hospitalization No Has patient had a PCN reaction occurring within the last 10 years: No If all of the above answers are "NO", then may proceed with Cephalosporin use.   Pork Allergy Other (See Comments)    Pt has tolerated multiple doses of heparin    Past Medical History:  Diagnosis Date   Bacterial vaginosis    Chlamydia    Gonorrhea    Obesity    UTI (lower urinary tract infection)      Past Surgical History:  Procedure Laterality Date   GASTRIC BYPASS     WISDOM TOOTH EXTRACTION      Family History  Problem Relation Age of Onset   Diabetes Father    Hypertension Mother     Social History   Tobacco Use   Smoking status:  Never   Smokeless tobacco: Never  Vaping Use   Vaping Use: Never used  Substance Use Topics   Alcohol use: Not Currently   Drug use: No    ROS   Objective:   Vitals: BP 120/89 (BP Location: Right Arm)   Pulse 84   Temp 97.9 F (36.6 C) (Oral)   Resp 16   LMP 02/28/2022 (Approximate)   SpO2 96%   Physical Exam Constitutional:      General: She is not in acute distress.    Appearance: Normal appearance. She is well-developed. She is not ill-appearing,  toxic-appearing or diaphoretic.  HENT:     Head: Normocephalic and atraumatic.     Nose: Nose normal.     Mouth/Throat:     Mouth: Mucous membranes are moist.     Pharynx: Oropharynx is clear.  Eyes:     General: No scleral icterus.       Right eye: No discharge.        Left eye: No discharge.     Extraocular Movements: Extraocular movements intact.     Conjunctiva/sclera: Conjunctivae normal.  Cardiovascular:     Rate and Rhythm: Normal rate and regular rhythm.     Heart sounds: Normal heart sounds. No murmur heard.    No friction rub. No gallop.  Pulmonary:     Effort: Pulmonary effort is normal. No respiratory distress.     Breath sounds: No stridor. No wheezing, rhonchi or rales.  Chest:     Chest wall: No tenderness.  Abdominal:     General: Bowel sounds are normal. There is no distension.     Palpations: Abdomen is soft. There is no mass.     Tenderness: There is abdominal tenderness (mild over lower abdominal area). There is no right CVA tenderness, left CVA tenderness, guarding or rebound.  Skin:    General: Skin is warm and dry.  Neurological:     General: No focal deficit present.     Mental Status: She is alert and oriented to person, place, and time.  Psychiatric:        Mood and Affect: Mood normal.        Behavior: Behavior normal.        Thought Content: Thought content normal.        Judgment: Judgment normal.     Assessment and Plan :   PDMP not reviewed this encounter.  1. Generalized headache   2. Near syncope   3. Dark urine   4. Vaginal discharge   5. Malaise and fatigue     Patient unable to provide urine sample in clinic.  Labs pending, will treat as appropriate based on the lab results.  Recommended continued supportive care.  Follow-up with her gastric bypass surgeon as soon as possible and her regular care provider.  No signs of a medical emergency at this stage. Counseled patient on potential for adverse effects with medications  prescribed/recommended today, ER and return-to-clinic precautions discussed, patient verbalized understanding.    Jaynee Eagles, PA-C 03/30/22 1441

## 2022-03-31 LAB — CERVICOVAGINAL ANCILLARY ONLY
Bacterial Vaginitis (gardnerella): NEGATIVE
Candida Glabrata: NEGATIVE
Candida Vaginitis: NEGATIVE
Comment: NEGATIVE
Comment: NEGATIVE
Comment: NEGATIVE

## 2022-03-31 LAB — CBC WITH DIFFERENTIAL/PLATELET
Basophils Absolute: 0 10*3/uL (ref 0.0–0.2)
Basos: 1 %
EOS (ABSOLUTE): 0.1 10*3/uL (ref 0.0–0.4)
Eos: 2 %
Hematocrit: 43.9 % (ref 34.0–46.6)
Hemoglobin: 14.2 g/dL (ref 11.1–15.9)
Immature Grans (Abs): 0 10*3/uL (ref 0.0–0.1)
Immature Granulocytes: 0 %
Lymphocytes Absolute: 1.6 10*3/uL (ref 0.7–3.1)
Lymphs: 25 %
MCH: 27.7 pg (ref 26.6–33.0)
MCHC: 32.3 g/dL (ref 31.5–35.7)
MCV: 86 fL (ref 79–97)
Monocytes Absolute: 0.7 10*3/uL (ref 0.1–0.9)
Monocytes: 12 %
Neutrophils Absolute: 3.9 10*3/uL (ref 1.4–7.0)
Neutrophils: 60 %
Platelets: 325 10*3/uL (ref 150–450)
RBC: 5.12 x10E6/uL (ref 3.77–5.28)
RDW: 15.8 % — ABNORMAL HIGH (ref 11.7–15.4)
WBC: 6.4 10*3/uL (ref 3.4–10.8)

## 2022-03-31 LAB — COMPREHENSIVE METABOLIC PANEL
ALT: 30 IU/L (ref 0–32)
AST: 32 IU/L (ref 0–40)
Albumin/Globulin Ratio: 1.8 (ref 1.2–2.2)
Albumin: 4.2 g/dL (ref 4.0–5.0)
Alkaline Phosphatase: 82 IU/L (ref 44–121)
BUN/Creatinine Ratio: 4 — ABNORMAL LOW (ref 9–23)
BUN: 2 mg/dL — ABNORMAL LOW (ref 6–20)
Bilirubin Total: 0.5 mg/dL (ref 0.0–1.2)
CO2: 23 mmol/L (ref 20–29)
Calcium: 9.2 mg/dL (ref 8.7–10.2)
Chloride: 100 mmol/L (ref 96–106)
Creatinine, Ser: 0.48 mg/dL — ABNORMAL LOW (ref 0.57–1.00)
Globulin, Total: 2.4 g/dL (ref 1.5–4.5)
Glucose: 78 mg/dL (ref 70–99)
Potassium: 3.3 mmol/L — ABNORMAL LOW (ref 3.5–5.2)
Sodium: 141 mmol/L (ref 134–144)
Total Protein: 6.6 g/dL (ref 6.0–8.5)
eGFR: 131 mL/min/{1.73_m2} (ref >59)

## 2022-04-01 ENCOUNTER — Telehealth: Payer: Self-pay

## 2022-04-01 NOTE — Patient Outreach (Signed)
Transition Care Management Unsuccessful Follow-up Telephone Call  Date of discharge and from where:  03/30/22 [facility]  Attempts:  1st Attempt  Reason for unsuccessful TCM follow-up call:  Unable to reach patient   Mickel Fuchs, BSW, [place] Medicaid Team  [phone]

## 2022-04-09 ENCOUNTER — Ambulatory Visit: Payer: Self-pay

## 2022-04-21 DIAGNOSIS — E860 Dehydration: Secondary | ICD-10-CM | POA: Diagnosis not present

## 2022-04-21 DIAGNOSIS — R112 Nausea with vomiting, unspecified: Secondary | ICD-10-CM | POA: Diagnosis not present

## 2022-04-21 DIAGNOSIS — K760 Fatty (change of) liver, not elsewhere classified: Secondary | ICD-10-CM | POA: Diagnosis not present

## 2022-04-21 DIAGNOSIS — R1013 Epigastric pain: Secondary | ICD-10-CM | POA: Diagnosis not present

## 2022-04-22 ENCOUNTER — Telehealth: Payer: Self-pay

## 2022-04-22 NOTE — Patient Outreach (Signed)
Transition Care Management Unsuccessful Follow-up Telephone Call  Date of discharge and from where:  04/21/22 [facility]  Attempts:  1st Attempt  Reason for unsuccessful TCM follow-up call:  Unable to reach patient  Mickel Fuchs, BSW, [place] Medicaid Team  [phone]

## 2022-04-26 DIAGNOSIS — Z9889 Other specified postprocedural states: Secondary | ICD-10-CM | POA: Diagnosis not present

## 2022-04-26 DIAGNOSIS — A048 Other specified bacterial intestinal infections: Secondary | ICD-10-CM | POA: Diagnosis not present

## 2022-04-26 DIAGNOSIS — Z6835 Body mass index (BMI) 35.0-35.9, adult: Secondary | ICD-10-CM | POA: Diagnosis not present

## 2022-04-26 DIAGNOSIS — Z9884 Bariatric surgery status: Secondary | ICD-10-CM | POA: Diagnosis not present

## 2022-04-26 DIAGNOSIS — R112 Nausea with vomiting, unspecified: Secondary | ICD-10-CM | POA: Diagnosis not present

## 2022-04-26 DIAGNOSIS — E669 Obesity, unspecified: Secondary | ICD-10-CM | POA: Diagnosis not present

## 2022-05-10 ENCOUNTER — Telehealth (INDEPENDENT_AMBULATORY_CARE_PROVIDER_SITE_OTHER): Payer: Self-pay | Admitting: Primary Care

## 2022-05-10 NOTE — Telephone Encounter (Signed)
Routing message to PCP

## 2022-05-10 NOTE — Telephone Encounter (Signed)
  Patient is calling back to check on her gastroenterology referral. Please advise.         Copied from [place] [phone]. Topic: Referral - Request for Referral >> Apr 29, 2022  9:56 AM Marcellus Scott wrote: Has patient seen PCP for this complaint? No. *If NO, is insurance requiring patient see PCP for this issue before PCP can refer them? Referral for which specialty: Gastroenterology Preferred provider/office: [facility] Address: [address], [place], [place] [address] Reason for referral: Pt mother called in and stated that the patient had bariatric sleeve surgery in September and 2 weeks later had another surgery due to complications. Pt and her mother are in [place] now, getting a second opinion. Pt mother stated the surgeon recommended she see a gastroenterologist . However, referral needs to come from pts PCP. Pt has not been seen by PCP for this however, her mother stated this is an emergency.  Please advise. >> May 05, 2022  8:48 AM Penni Bombard wrote: Pt called saying she has a GI out of town that will see her but she needs a referral.  CB@  [phone]

## 2022-05-11 NOTE — Telephone Encounter (Signed)
The actual office pt wants referral sent to is Address: [address], [place], [place] [address] [facility] / please call pt today to advise about the referral / she stated she has been advised she would receive a call and has not

## 2022-05-13 DIAGNOSIS — R531 Weakness: Secondary | ICD-10-CM | POA: Diagnosis not present

## 2022-05-13 DIAGNOSIS — R42 Dizziness and giddiness: Secondary | ICD-10-CM | POA: Diagnosis not present

## 2022-05-13 DIAGNOSIS — E860 Dehydration: Secondary | ICD-10-CM | POA: Diagnosis not present

## 2022-05-17 ENCOUNTER — Other Ambulatory Visit (INDEPENDENT_AMBULATORY_CARE_PROVIDER_SITE_OTHER): Payer: Self-pay | Admitting: Primary Care

## 2022-05-17 ENCOUNTER — Ambulatory Visit (INDEPENDENT_AMBULATORY_CARE_PROVIDER_SITE_OTHER): Payer: Self-pay | Admitting: *Deleted

## 2022-05-17 DIAGNOSIS — R109 Unspecified abdominal pain: Secondary | ICD-10-CM

## 2022-05-17 NOTE — Telephone Encounter (Signed)
Offered Mobile Unit to patient but declined. She is moreso concerned that her referral was placed.   Patient is no longer in [place]. Prefers that the referral be made to a local office, in [place] that accepts her insurance.   Advised that referral was placed today at 1430.  Prior to PCP was on PAL.  Patient informed that she should hear back from GI [facility] w/I 2 weeks. Advised to call if she did not.

## 2022-05-17 NOTE — Telephone Encounter (Signed)
  Chief Complaint: Abdominal Pain Symptoms: Lower and upper abdominal pain, 8-9/10, comes and goes, occurs throughout day. Nausea and vomiting, "Thick clear."  Frequency: October nausea and vomiting , pain over a week Pertinent Negatives: Patient denies  Disposition: '[x]'$ ED /'[]'$ Urgent Care (no appt availability in office) / '[]'$ Appointment(In office/virtual)/ '[]'$  [facility]/ '[]'$ Home Care/ '[]'$ Refused Recommended Disposition /'[]'$ [facility]/ '[]'$  Follow-up with PCP Additional Notes: Pt calling initially to check on referral request. Expresses frustration. Declines ED "I go there and they tell me see PCP." Assured pt NT would route to practice for PCPs review and final disposition.  Please advise. No availability. AReason for Disposition  Patient sounds very sick or weak to the triager  Answer Assessment - Initial Assessment Questions 1. LOCATION: "Where does it hurt?"      Lower and top of stomach 2. RADIATION: "Does the pain shoot anywhere else?" (e.g., chest, back)     No 3. ONSET: "When did the pain begin?" (e.g., minutes, hours or days ago)      October 4. SUDDEN: "Gradual or sudden onset?"      5. PATTERN "Does the pain come and go, or is it constant?"    - If it comes and goes: "How long does it last?" "Do you have pain now?"     (Note: Comes and goes means the pain is intermittent. It goes away completely between bouts.)    - If constant: "Is it getting better, staying the same, or getting worse?"      (Note: Constant means the pain never goes away completely; most serious pain is constant and gets worse.)      Constant, comes and goes at times 6. SEVERITY: "How bad is the pain?"  (e.g., Scale 1-10; mild, moderate, or severe)    - MILD (1-3): Doesn't interfere with normal activities, abdomen soft and not tender to touch..     - MODERATE (4-7): Interferes with normal activities or awakens from sleep, abdomen tender to touch.     - SEVERE (8-10): Excruciating pain,  doubled over, unable to do any normal activities.       8-9/10 when occurs 7. RECURRENT SYMPTOM: "Have you ever had this type of stomach pain before?" If Yes, ask: "When was the last time?" and "What happened that time?"      Since Oct 8. AGGRAVATING FACTORS: "Does anything seem to cause this pain?" (e.g., foods, stress, alcohol)     No  10. OTHER SYMPTOMS: "Do you have any other symptoms?" (e.g., back pain, diarrhea, fever, urination pain, vomiting)       Nausea and vomiting  Protocols used: Abdominal Pain - Upper-A-AH

## 2022-05-20 ENCOUNTER — Emergency Department (HOSPITAL_COMMUNITY)
Admission: EM | Admit: 2022-05-20 | Discharge: 2022-05-20 | Disposition: A | Discharge status: Home / Self Care | Payer: Medicaid Other | Attending: Emergency Medicine | Admitting: Emergency Medicine

## 2022-05-20 ENCOUNTER — Emergency Department (HOSPITAL_COMMUNITY): Payer: Medicaid Other

## 2022-05-20 ENCOUNTER — Encounter (HOSPITAL_COMMUNITY): Payer: Self-pay

## 2022-05-20 ENCOUNTER — Other Ambulatory Visit: Payer: Self-pay

## 2022-05-20 DIAGNOSIS — R1012 Left upper quadrant pain: Secondary | ICD-10-CM | POA: Diagnosis present

## 2022-05-20 DIAGNOSIS — R7401 Elevation of levels of liver transaminase levels: Secondary | ICD-10-CM | POA: Insufficient documentation

## 2022-05-20 DIAGNOSIS — E876 Hypokalemia: Secondary | ICD-10-CM | POA: Insufficient documentation

## 2022-05-20 DIAGNOSIS — R109 Unspecified abdominal pain: Secondary | ICD-10-CM | POA: Diagnosis not present

## 2022-05-20 DIAGNOSIS — K2900 Acute gastritis without bleeding: Secondary | ICD-10-CM | POA: Insufficient documentation

## 2022-05-20 DIAGNOSIS — R1110 Vomiting, unspecified: Secondary | ICD-10-CM | POA: Diagnosis not present

## 2022-05-20 LAB — CBC
HCT: 39.6 % (ref 36.0–46.0)
Hemoglobin: 12.9 g/dL (ref 12.0–15.0)
MCH: 27.7 pg (ref 26.0–34.0)
MCHC: 32.6 g/dL (ref 30.0–36.0)
MCV: 85.2 fL (ref 80.0–100.0)
Platelets: 321 10*3/uL (ref 150–400)
RBC: 4.65 MIL/uL (ref 3.87–5.11)
RDW: 15.1 % (ref 11.5–15.5)
WBC: 7.4 10*3/uL (ref 4.0–10.5)
nRBC: 0 % (ref 0.0–0.2)

## 2022-05-20 LAB — URINALYSIS, ROUTINE W REFLEX MICROSCOPIC
Bilirubin Urine: NEGATIVE
Glucose, UA: NEGATIVE mg/dL
Hgb urine dipstick: NEGATIVE
Ketones, ur: 80 mg/dL — AB
Nitrite: NEGATIVE
Protein, ur: NEGATIVE mg/dL
Specific Gravity, Urine: 1.014 (ref 1.005–1.030)
pH: 6 (ref 5.0–8.0)

## 2022-05-20 LAB — COMPREHENSIVE METABOLIC PANEL
ALT: 48 U/L — ABNORMAL HIGH (ref 0–44)
AST: 53 U/L — ABNORMAL HIGH (ref 15–41)
Albumin: 3.4 g/dL — ABNORMAL LOW (ref 3.5–5.0)
Alkaline Phosphatase: 59 U/L (ref 38–126)
Anion gap: 10 (ref 5–15)
BUN: <5 mg/dL — ABNORMAL LOW (ref 6–20)
CO2: 26 mmol/L (ref 22–32)
Calcium: 8.6 mg/dL — ABNORMAL LOW (ref 8.9–10.3)
Chloride: 106 mmol/L (ref 98–111)
Creatinine, Ser: 0.35 mg/dL — ABNORMAL LOW (ref 0.44–1.00)
GFR, Estimated: >60 mL/min (ref >60)
Glucose, Bld: 74 mg/dL (ref 70–99)
Potassium: 2.9 mmol/L — ABNORMAL LOW (ref 3.5–5.1)
Sodium: 142 mmol/L (ref 135–145)
Total Bilirubin: 1.1 mg/dL (ref 0.3–1.2)
Total Protein: 6.2 g/dL — ABNORMAL LOW (ref 6.5–8.1)

## 2022-05-20 LAB — BASIC METABOLIC PANEL
Anion gap: 8 (ref 5–15)
BUN: <5 mg/dL — ABNORMAL LOW (ref 6–20)
CO2: 25 mmol/L (ref 22–32)
Calcium: 8 mg/dL — ABNORMAL LOW (ref 8.9–10.3)
Chloride: 104 mmol/L (ref 98–111)
Creatinine, Ser: 0.33 mg/dL — ABNORMAL LOW (ref 0.44–1.00)
GFR, Estimated: >60 mL/min (ref >60)
Glucose, Bld: 68 mg/dL — ABNORMAL LOW (ref 70–99)
Potassium: 3 mmol/L — ABNORMAL LOW (ref 3.5–5.1)
Sodium: 137 mmol/L (ref 135–145)

## 2022-05-20 LAB — MAGNESIUM: Magnesium: 2 mg/dL (ref 1.7–2.4)

## 2022-05-20 LAB — LIPASE, BLOOD: Lipase: 26 U/L (ref 11–51)

## 2022-05-20 LAB — I-STAT BETA HCG BLOOD, ED (MC, WL, AP ONLY): I-stat hCG, quantitative: <5 m[IU]/mL (ref <5)

## 2022-05-20 LAB — CBG MONITORING, ED: Glucose-Capillary: 79 mg/dL (ref 70–99)

## 2022-05-20 MED ORDER — SUCRALFATE 1 GM/10ML PO SUSP
1.0000 g | Freq: Once | ORAL | Status: AC
  Administered 2022-05-20: 1 g via ORAL
  Filled 2022-05-20: qty 10

## 2022-05-20 MED ORDER — POTASSIUM CHLORIDE CRYS ER 20 MEQ PO TBCR: 40.0000 meq | EXTENDED_RELEASE_TABLET | Freq: Two times a day (BID) | ORAL | 0 refills | Status: DC

## 2022-05-20 MED ORDER — POTASSIUM CHLORIDE CRYS ER 20 MEQ PO TBCR
40.0000 meq | EXTENDED_RELEASE_TABLET | Freq: Once | ORAL | Status: AC
  Administered 2022-05-20: 40 meq via ORAL
  Filled 2022-05-20: qty 2

## 2022-05-20 MED ORDER — PANTOPRAZOLE SODIUM 40 MG IV SOLR
40.0000 mg | Freq: Once | INTRAVENOUS | Status: AC
  Administered 2022-05-20: 40 mg via INTRAVENOUS
  Filled 2022-05-20: qty 10

## 2022-05-20 MED ORDER — IOHEXOL 300 MG/ML  SOLN
100.0000 mL | Freq: Once | INTRAMUSCULAR | Status: AC | PRN
  Administered 2022-05-20: 100 mL via INTRAVENOUS

## 2022-05-20 MED ORDER — LACTATED RINGERS IV BOLUS
1000.0000 mL | Freq: Once | INTRAVENOUS | Status: AC
  Administered 2022-05-20: 1000 mL via INTRAVENOUS

## 2022-05-20 MED ORDER — POTASSIUM CHLORIDE 10 MEQ/100ML IV SOLN
10.0000 meq | INTRAVENOUS | Status: AC
  Administered 2022-05-20 (×2): 10 meq via INTRAVENOUS
  Filled 2022-05-20 (×2): qty 100

## 2022-05-20 MED ORDER — POTASSIUM CHLORIDE 10 MEQ/100ML IV SOLN
10.0000 meq | Freq: Once | INTRAVENOUS | Status: AC
  Administered 2022-05-20: 10 meq via INTRAVENOUS
  Filled 2022-05-20: qty 100

## 2022-05-20 MED ORDER — HYDROMORPHONE HCL 1 MG/ML IJ SOLN
1.0000 mg | Freq: Once | INTRAMUSCULAR | Status: AC
  Administered 2022-05-20: 1 mg via INTRAVENOUS
  Filled 2022-05-20: qty 1

## 2022-05-20 MED ORDER — METOCLOPRAMIDE HCL 5 MG/ML IJ SOLN
10.0000 mg | Freq: Once | INTRAMUSCULAR | Status: AC
  Administered 2022-05-20: 10 mg via INTRAVENOUS
  Filled 2022-05-20: qty 2

## 2022-05-20 NOTE — ED Triage Notes (Signed)
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  Patient reports that she is having mid upper abdominal pain and vomiting x 4 days.

## 2022-05-20 NOTE — ED Provider Notes (Signed)
[facility] DEPT Provider Note   CSN: [id number] Arrival date & time: 05/20/22  0931     History  Chief Complaint  Patient presents with   Post-op Problem    Melanie Cisneros is a 32 y.o. female.  HPI 32 year old female presents with upper abdominal pain.  Is been ongoing for about 4 days.  She has been dealing with complications of a gastric bypass, first surgery was in September and then had to have another surgery a couple weeks later in October.  Since then she has been dealing with a lot of spitting up daily.  However over the last 4 days she developed abdominal pain that feels like a tightening/twisting in her upper abdomen.  Primarily happens whenever she drinks and water goes into her stomach.  She is also having this continues spitting up which is clear and yellow.  She has not noticed any blood or black in her emesis and no bloody or black stools.  She states that frequently she has not had to go to outside hospitals for fluids for dehydration and typically her CTs do not show any obvious findings.  Upon medication review she was noted that she has not been taking her omeprazole for the last several days, primarily due to this pain.  Home Medications Prior to Admission medications   Medication Sig Start Date End Date Taking? Authorizing Provider  potassium chloride SA (KLOR-CON M) 20 MEQ tablet Take 2 tablets (40 mEq total) by mouth 2 (two) times daily for 5 days. 05/20/22 05/25/22 Yes Sherwood Gambler, MD  acetaminophen (TYLENOL) 325 MG tablet Take 2 tablets (650 mg total) by mouth every 6 (six) hours as needed for moderate pain. 03/30/22   Jaynee Eagles, PA-C  enoxaparin (LOVENOX) 40 MG/0.4ML injection Inject into the skin. Patient not taking: Reported on 03/09/2022 02/16/22   [provider]  levofloxacin (LEVAQUIN) 250 MG tablet Take 250 mg by mouth daily. 03/09/22   [provider]  metoprolol tartrate (LOPRESSOR) 25 MG tablet Take  by mouth. 02/16/22   [provider]  Multiple Vitamins-Minerals (MULTIVITAMIN ADULTS) TABS Take 1 tablet by mouth daily.    [provider]  omeprazole (PRILOSEC) 40 MG capsule Take by mouth. 01/19/22   [provider]  promethazine (PHENERGAN) 25 MG suppository Place 25 mg rectally every 6 (six) hours as needed. Patient not taking: Reported on 02/28/2022 02/11/22   [provider]  ursodiol (ACTIGALL) 300 MG capsule Take by mouth. 01/19/22   [provider]  albuterol (VENTOLIN HFA) 108 (90 Base) MCG/ACT inhaler Inhale 1-2 puffs into the lungs every 6 (six) hours as needed for wheezing or shortness of breath. Patient not taking: Reported on 04/14/2020 10/04/19 04/27/20  [name], FNP      Allergies    Amoxicillin, Penicillins, and Pork allergy    Review of Systems   Review of Systems  Constitutional:  Negative for fever.  Respiratory:  Negative for shortness of breath.   Cardiovascular:  Negative for chest pain.  Gastrointestinal:  Positive for abdominal pain and vomiting. Negative for blood in stool and diarrhea.    Physical Exam Updated Vital Signs BP 124/76   Pulse 65   Temp 98.2 F (36.8 C)   Resp 17   Ht '5\' 5"'$  (1.651 m)   Wt 95.3 kg   LMP 05/12/2022   SpO2 98%   BMI 34.95 kg/m  Physical Exam Vitals and nursing note reviewed.  Constitutional:  General: She is not in acute distress.    Appearance: She is well-developed. She is not ill-appearing or diaphoretic.  HENT:     Head: Normocephalic and atraumatic.  Cardiovascular:     Rate and Rhythm: Normal rate and regular rhythm.     Heart sounds: Normal heart sounds.  Pulmonary:     Effort: Pulmonary effort is normal.     Breath sounds: Normal breath sounds.  Abdominal:     Palpations: Abdomen is soft.     Tenderness: There is abdominal tenderness in the epigastric area and left upper quadrant.  Skin:    General: Skin is warm and dry.  Neurological:     Mental  Status: She is alert.     ED Results / Procedures / Treatments   Labs (all labs ordered are listed, but only abnormal results are displayed) Labs Reviewed  COMPREHENSIVE METABOLIC PANEL - Abnormal; Notable for the following components:      Result Value   Potassium 2.9 (*)    BUN <5 (*)    Creatinine, Ser 0.35 (*)    Calcium 8.6 (*)    Total Protein 6.2 (*)    Albumin 3.4 (*)    AST 53 (*)    ALT 48 (*)    All other components within normal limits  URINALYSIS, ROUTINE W REFLEX MICROSCOPIC - Abnormal; Notable for the following components:   APPearance HAZY (*)    Ketones, ur 80 (*)    Leukocytes,Ua TRACE (*)    Bacteria, UA RARE (*)    All other components within normal limits  BASIC METABOLIC PANEL - Abnormal; Notable for the following components:   Potassium 3.0 (*)    Glucose, Bld 68 (*)    BUN <5 (*)    Creatinine, Ser 0.33 (*)    Calcium 8.0 (*)    All other components within normal limits  LIPASE, BLOOD  CBC  MAGNESIUM  I-STAT BETA HCG BLOOD, ED ([facility], [facility], [facility] ONLY)  CBG MONITORING, ED    EKG EKG Interpretation  Date/Time:  Friday May 20 2022 22:19:15 EST Ventricular Rate:  75 PR Interval:  149 QRS Duration: 98 QT Interval:  454 QTC Calculation: 508 R Axis:   8 Text Interpretation: Sinus rhythm Low voltage, precordial leads Borderline T abnormalities, diffuse leads Prolonged QT interval Baseline wander in lead(s) V3 V6 Confirmed by Sherwood Gambler [phone]) on 05/20/2022 11:08:43 PM  Radiology CT ABDOMEN PELVIS W CONTRAST  Result Date: 05/20/2022 CLINICAL DATA:  Gastric sleeve surgery in September, gastric bypass and October, upper abdominal pain and vomiting X 4 days EXAM: CT ABDOMEN AND PELVIS WITH CONTRAST TECHNIQUE: Multidetector CT imaging of the abdomen and pelvis was performed using the standard protocol following bolus administration of intravenous contrast. RADIATION DOSE REDUCTION: This exam was performed according to the departmental dose-optimization  program which includes automated exposure control, adjustment of the mA and/or kV according to patient size and/or use of iterative reconstruction technique. CONTRAST:  196m OMNIPAQUE IOHEXOL 300 MG/ML  SOLN COMPARISON:  03/10/2022 and previous FINDINGS: Lower chest: No pleural or pericardial effusion. Visualized lung bases clear. Hepatobiliary: No focal liver abnormality is seen. No gallstones, gallbladder wall thickening, or biliary dilatation. Pancreas: Unremarkable. No pancreatic ductal dilatation or surrounding inflammatory changes. Spleen: Normal in size without focal abnormality. Adrenals/Urinary Tract: Adrenal glands are unremarkable. Kidneys are normal, without renal calculi, focal lesion, or hydronephrosis. Bladder is unremarkable. Stomach/Bowel: Post gastric bypass. Gastric components are nondilated. Small bowel decompressed. Normal appendix. The colon is  incompletely distended by gas and fecal material, unremarkable. Vascular/Lymphatic: No significant vascular findings are present. No enlarged abdominal or pelvic lymph nodes. Cluster of subcentimeter nodes medial to the cecum. Portal vein patent. Reproductive: Uterus and bilateral adnexa are unremarkable. Other: Bilateral pelvic phleboliths.  No ascites.  No free air. Musculoskeletal: Small paraumbilical hernia containing only mesenteric fat. No acute findings. IMPRESSION: 1. No acute findings. 2. Post gastric bypass. Electronically Signed   By: Lucrezia Europe M.D.   On: 05/20/2022 12:55    Procedures Procedures    Medications Ordered in ED Medications  iohexol (OMNIPAQUE) 300 MG/ML solution 100 mL (100 mLs Intravenous Contrast Given 05/20/22 1245)  lactated ringers bolus 1,000 mL (0 mLs Intravenous Stopped 05/20/22 1700)  HYDROmorphone (DILAUDID) injection 1 mg (1 mg Intravenous Given 05/20/22 1543)  metoCLOPramide (REGLAN) injection 10 mg (10 mg Intravenous Given 05/20/22 1543)  pantoprazole (PROTONIX) injection 40 mg (40 mg Intravenous Given 05/20/22  1543)  sucralfate (CARAFATE) 1 GM/10ML suspension 1 g (1 g Oral Given 05/20/22 1543)  potassium chloride 10 mEq in 100 mL IVPB (0 mEq Intravenous Stopped 05/20/22 1759)  potassium chloride SA (KLOR-CON M) CR tablet 40 mEq (40 mEq Oral Given 05/20/22 1659)  potassium chloride 10 mEq in 100 mL IVPB (0 mEq Intravenous Stopped 05/20/22 2023)    ED Course/ Medical Decision Making/ A&P Clinical Course as of 05/20/22 2341  Fri May 20, 2022  1702 I discussed case with Dr. Verlon Au. He recommends 3-4 runs of K IV, recheck K and if it comes up can likely be discharged. She otherwise is well appearing, no symptoms of syncope/near-syncope recently, and is tolerating PO. Will re-check and re-eval [SG]    Clinical Course User Index [SG] Sherwood Gambler, MD                           Medical Decision Making Amount and/or Complexity of Data Reviewed Labs: ordered.    Details: Hypokalemia, mild LFT elevation Normal WBC/hemoglobin Radiology: independent interpretation performed.    Details: CT abd pelvis - no free air or obstruction ECG/medicine tests: ordered and independent interpretation performed.    Details: Prolonged QTc that did shorten after potassium replacement, most recently at 508  Risk Prescription drug management.   Patient presents with abdominal pain that is probably gastritis.  CT which was obtained in triage is unremarkable overall.  She has some hypokalemia though this is a recurrent issue based on chart review from this hospital and other hospitals.  She did have some prolonged QTc on the first EKG though I do question the validity of this.  Repeat was better.  I originally discussed with Dr. Verlon Au as above.  However with the improvement I discussed again with Dr. Alcario Drought and he feels like the QTc is better and she is stable for discharge home given she is tolerating p.o.  Will discharge home with potassium replacement.  Otherwise, discussed the importance of getting back on her omeprazole  and following up with GI and her PCP.  No syncope/near syncope symptoms.  Will discharge home with return precautions.        Final Clinical Impression(s) / ED Diagnoses Final diagnoses:  Acute gastritis without hemorrhage, unspecified gastritis type  Hypokalemia    Rx / DC Orders ED Discharge Orders          Ordered    potassium chloride SA (KLOR-CON M) 20 MEQ tablet  2 times daily  05/20/22 [place], MD 05/20/22 2342

## 2022-05-20 NOTE — ED Provider Triage Note (Signed)
Emergency Medicine Provider Triage Evaluation Note  Melanie Cisneros , a 32 y.o. female  was evaluated in triage.  Pt complains of epigastric pain.  Patient reports that she recently had a gastric bypass performed in September was a revision surgery performed later.  She reports she is also been having lingering pain in her abdomen since these procedures were done and is now having difficulty controlling the amount of saliva she is producing.  She denies any dysphagia, fevers, chest pain, shortness of breath.  Review of Systems  Positive: As above Negative: As above  Physical Exam  BP 137/89 (BP Location: Right Arm)   Pulse (!) 101   Temp 98 F (36.7 C) (Oral)   Resp 16   Ht '5\' 5"'$  (1.651 m)   Wt 95.3 kg   LMP 05/12/2022   SpO2 97%   BMI 34.95 kg/m  Gen:   Awake, no distress   Resp:  Normal effort  MSK:   Moves extremities without difficulty  Other:  Epigastric tenderness  Medical Decision Making  Medically screening exam initiated at 12:21 PM.  Appropriate orders placed.  Meleane D Petralia was informed that the remainder of the evaluation will be completed by another provider, this initial triage assessment does not replace that evaluation, and the importance of remaining in the ED until their evaluation is complete.     Luvenia Heller, PA-C 05/20/22 1232

## 2022-05-20 NOTE — ED Notes (Signed)
Patient has a urine culture in the main lab 

## 2022-05-20 NOTE — Discharge Instructions (Addendum)
Take your Prilosec daily until you see the GI specialist. You are also being prescribed potassium to replenish your low potassium.  Follow-up with your primary care physician, ideally to get blood work next week for a recheck.  If you develop new or worsening abdominal pain, vomiting, dizziness, lightheadedness or palpitations, or any other new/concerning symptoms then return to the ER for evaluation.

## 2022-05-23 ENCOUNTER — Other Ambulatory Visit (INDEPENDENT_AMBULATORY_CARE_PROVIDER_SITE_OTHER): Payer: Self-pay | Admitting: Primary Care

## 2022-05-23 ENCOUNTER — Other Ambulatory Visit: Payer: Self-pay

## 2022-05-23 DIAGNOSIS — R109 Unspecified abdominal pain: Secondary | ICD-10-CM

## 2022-05-23 NOTE — Telephone Encounter (Signed)
Gm   Sharyn Lull   Can you please, place a new referral for Gi  per [place] and patient conversation patient need a referral for Gi in [place]. Thank you     Patient is no longer in [place]. Prefers that the referral be made to a local office, in [place] that accepts her insurance.

## 2022-05-31 ENCOUNTER — Ambulatory Visit (HOSPITAL_COMMUNITY)
Admission: RE | Admit: 2022-05-31 | Discharge: 2022-05-31 | Disposition: A | Discharge status: Home / Self Care | Payer: Medicaid Other | Source: Ambulatory Visit | Attending: Internal Medicine | Admitting: Internal Medicine

## 2022-05-31 ENCOUNTER — Encounter (HOSPITAL_COMMUNITY): Payer: Self-pay

## 2022-05-31 VITALS — BP 115/75 | HR 79 | Temp 98.7°F | Resp 18

## 2022-05-31 DIAGNOSIS — N898 Other specified noninflammatory disorders of vagina: Secondary | ICD-10-CM | POA: Diagnosis not present

## 2022-05-31 DIAGNOSIS — J029 Acute pharyngitis, unspecified: Secondary | ICD-10-CM

## 2022-05-31 NOTE — ED Provider Notes (Signed)
Melanie Cisneros    CSN: [id number] Arrival date & time: 05/31/22  1329      History   Chief Complaint Chief Complaint  Patient presents with   Nasal Congestion    I believe I have strep throat - Entered by patient   Sore Throat   Vaginal Discharge    HPI Melanie Cisneros is a 32 y.o. female.   Patient presents urgent care for evaluation of sore throat that started 2 days ago but has since improved significantly after drinking warm tea last night.  Patient believes that she may have strep throat but has not had any fever, chills, rash, abdominal pain, nausea, vomiting, known sick contacts, difficulty/painful swallowing, or dizziness.  Denies cough, shortness of breath, chest pain, headache, ear pain, and recent viral URI.  She has not attempted use of any other over-the-counter medications other than the warm tea with honey for throat pain and states that her symptoms have almost completely resolved since last night.  Patient would like to be evaluated for vaginal discharge that she has a difficult time describing and started several weeks ago.  She is not sexually active at this time and denies recent new sexual partners/known exposure to STD.  Denies vaginal itching, vaginal rash, and history of frequent BV/yeast infections.  No urinary symptoms reported.  She does report a strong vaginal odor.  No recent antibiotic or steroid use.  Has not attempted use of over-the-counter medicines to help with vaginal symptoms.   Sore Throat  Vaginal Discharge   Past Medical History:  Diagnosis Date   Bacterial vaginosis    Chlamydia    Gonorrhea    Obesity    UTI (lower urinary tract infection)     Patient Active Problem List   Diagnosis Date Noted   Gonorrhea 11/09/2021   Ovarian cyst 11/09/2021   Bacterial vaginosis 05/13/2020   BMI 45.0-49.9, adult ([place]) 06/06/2016   Psychosocial stressors 02/25/2016   Ptyalism 02/25/2016   Rubella non-immune status, antepartum  01/27/2016    Past Surgical History:  Procedure Laterality Date   GASTRIC BYPASS     WISDOM TOOTH EXTRACTION      OB History     Gravida  1   Para  1   Term  1   Preterm  0   AB  0   Living  1      SAB  0   IAB  0   Ectopic  0   Multiple  0   Live Births  1            Home Medications    Prior to Admission medications   Medication Sig Start Date End Date Taking? Authorizing Provider  acetaminophen (TYLENOL) 325 MG tablet Take 2 tablets (650 mg total) by mouth every 6 (six) hours as needed for moderate pain. 03/30/22   Jaynee Eagles, PA-C  enoxaparin (LOVENOX) 40 MG/0.4ML injection Inject into the skin. Patient not taking: Reported on 03/09/2022 02/16/22   [provider]  levofloxacin (LEVAQUIN) 250 MG tablet Take 250 mg by mouth daily. 03/09/22   [provider]  metoprolol tartrate (LOPRESSOR) 25 MG tablet Take by mouth. 02/16/22   [provider]  Multiple Vitamins-Minerals (MULTIVITAMIN ADULTS) TABS Take 1 tablet by mouth daily.    [provider]  omeprazole (PRILOSEC) 40 MG capsule Take by mouth. 01/19/22   [provider]  potassium chloride SA (KLOR-CON M) 20 MEQ tablet Take 2 tablets (40 mEq total) by  mouth 2 (two) times daily for 5 days. 05/20/22 05/25/22  Sherwood Gambler, MD  promethazine (PHENERGAN) 25 MG suppository Place 25 mg rectally every 6 (six) hours as needed. Patient not taking: Reported on 02/28/2022 02/11/22   [provider]  ursodiol (ACTIGALL) 300 MG capsule Take by mouth. 01/19/22   [provider]  albuterol (VENTOLIN HFA) 108 (90 Base) MCG/ACT inhaler Inhale 1-2 puffs into the lungs every 6 (six) hours as needed for wheezing or shortness of breath. Patient not taking: Reported on 04/14/2020 10/04/19 04/27/20  [name], FNP    Family History Family History  Problem Relation Age of Onset   Diabetes Father    Hypertension Mother     Social History Social History    Tobacco Use   Smoking status: Never   Smokeless tobacco: Never  Vaping Use   Vaping Use: Never used  Substance Use Topics   Alcohol use: Not Currently   Drug use: No     Allergies   Amoxicillin, Penicillins, and Pork allergy   Review of Systems Review of Systems  Genitourinary:  Positive for vaginal discharge.   Per HPI  Physical Exam Triage Vital Signs ED Triage Vitals [05/31/22 1341]  Enc Vitals Group     BP 115/75     Pulse Rate 79     Resp 18     Temp 98.7 F (37.1 C)     Temp Source Oral     SpO2 98 %     Weight      Height      Head Circumference      Peak Flow      Pain Score 0     Pain Loc      Pain Edu?      Excl. in [place]?    No data found.  Updated Vital Signs BP 115/75 (BP Location: Right Arm)   Pulse 79   Temp 98.7 F (37.1 C) (Oral)   Resp 18   LMP 05/12/2022   SpO2 98%   Visual Acuity Right Eye Distance:   Left Eye Distance:   Bilateral Distance:    Right Eye Near:   Left Eye Near:    Bilateral Near:     Physical Exam Vitals and nursing note reviewed.  Constitutional:      Appearance: She is not ill-appearing or toxic-appearing.  HENT:     Head: Normocephalic and atraumatic.     Right Ear: Hearing, tympanic membrane, ear canal and external ear normal.     Left Ear: Hearing, tympanic membrane, ear canal and external ear normal.     Nose: Nose normal.     Mouth/Throat:     Lips: Pink.     Mouth: Mucous membranes are moist. No oral lesions.     Pharynx: No pharyngeal swelling, oropharyngeal exudate, posterior oropharyngeal erythema or uvula swelling.     Tonsils: No tonsillar exudate or tonsillar abscesses.  Eyes:     General: Lids are normal. Vision grossly intact. Gaze aligned appropriately.     Extraocular Movements: Extraocular movements intact.     Conjunctiva/sclera: Conjunctivae normal.  Cardiovascular:     Rate and Rhythm: Normal rate and regular rhythm.     Heart sounds: Normal heart sounds, S1 normal and S2  normal.  Pulmonary:     Effort: Pulmonary effort is normal. No respiratory distress.     Breath sounds: Normal breath sounds and air entry.  Musculoskeletal:     Cervical back: Neck supple.  Skin:    General: Skin is warm and dry.     Capillary Refill: Capillary refill takes less than 2 seconds.     Findings: No rash.  Neurological:     General: No focal deficit present.     Mental Status: She is alert and oriented to person, place, and time. Mental status is at baseline.     Cranial Nerves: No dysarthria or facial asymmetry.  Psychiatric:        Mood and Affect: Mood normal.        Speech: Speech normal.        Behavior: Behavior normal.        Thought Content: Thought content normal.        Judgment: Judgment normal.      UC Treatments / Results  Labs (all labs ordered are listed, but only abnormal results are displayed) Labs Reviewed - No data to display  EKG   Radiology No results found.  Procedures Procedures (including critical care time)  Medications Ordered in UC Medications - No data to display  Initial Impression / Assessment and Plan / UC Course  I have reviewed the triage vital signs and the nursing notes.  Pertinent labs & imaging results that were available during my care of the patient were reviewed by me and considered in my medical decision making (see chart for details).   1.  Sore throat Deferred strep/viral testing due to reassuring physical exam findings that are inconsistent with acute viral/bacterial infection.  Patient's symptoms have almost completely resolved.  She may take ibuprofen intermittently as needed for any further pain she experiences.   2. Vaginal discharge STI labs pending.  Patient declines HIV and syphilis testing today.  Will notify patient of positive results and treat accordingly when labs come back.  Patient to avoid sexual intercourse until screening testing comes back.  Education provided regarding safe sexual practices  and patient encouraged to use protection to prevent spread of STIs.    Patient left without papers and states she is very active on mychart. She prefers to look at AVS on my chart. I am agreeable with this.  Discussed physical exam and available lab work findings in clinic with patient.  Counseled patient regarding appropriate use of medications and potential side effects for all medications recommended or prescribed today. Discussed red flag signs and symptoms of worsening condition,when to call the PCP office, return to urgent care, and when to seek higher level of care in the emergency department. Patient verbalizes understanding and agreement with plan. All questions answered. Patient discharged in stable condition.   Final Clinical Impressions(s) / UC Diagnoses   Final diagnoses:  Sore throat  Vaginal discharge     Discharge Instructions      Your STD testing has been sent to the lab and will come back in the next 2 to 3 days.  We will call you if any of your results are positive requiring treatment and treat you at that time.   If you do not receive a phone call from [place], this means your testing was negative.  Avoid sexual intercourse until your STD results come back.  If any of your STD results are positive, you will need to avoid sexual intercourse for 7 days while you are being treated to prevent spread of STD.  Condom use is the best way to prevent spread of STDs.  Return to urgent care as needed.       ED Prescriptions  None    PDMP not reviewed this encounter.   Talbot Grumbling, [place] 05/31/22 1358

## 2022-05-31 NOTE — Discharge Instructions (Signed)
Your STD testing has been sent to the lab and will come back in the next 2 to 3 days.  We will call you if any of your results are positive requiring treatment and treat you at that time.   If you do not receive a phone call from us, this means your testing was negative.  Avoid sexual intercourse until your STD results come back.  If any of your STD results are positive, you will need to avoid sexual intercourse for 7 days while you are being treated to prevent spread of STD.  Condom use is the best way to prevent spread of STDs.  Return to urgent care as needed.  

## 2022-05-31 NOTE — ED Triage Notes (Signed)
Pt c/o sore throat x2 days but better today. Pt requesting a strep test. States also having a headache.  Pt c/o vaginal d/c, odor, and dryness since her gastric bypass 10/23. Pt requesting a vaginal swab test and a UA.

## 2022-06-01 LAB — CERVICOVAGINAL ANCILLARY ONLY
Bacterial Vaginitis (gardnerella): NEGATIVE
Candida Glabrata: NEGATIVE
Candida Vaginitis: NEGATIVE
Chlamydia: NEGATIVE
Comment: NEGATIVE
Comment: NEGATIVE
Comment: NEGATIVE
Comment: NEGATIVE
Comment: NEGATIVE
Comment: NORMAL
Neisseria Gonorrhea: NEGATIVE
Trichomonas: NEGATIVE

## 2022-06-02 ENCOUNTER — Emergency Department (HOSPITAL_COMMUNITY)
Admission: EM | Admit: 2022-06-02 | Discharge: 2022-06-02 | Disposition: A | Discharge status: Home / Self Care | Payer: Medicaid Other | Attending: Emergency Medicine | Admitting: Emergency Medicine

## 2022-06-02 ENCOUNTER — Other Ambulatory Visit: Payer: Self-pay

## 2022-06-02 DIAGNOSIS — R9431 Abnormal electrocardiogram [ECG] [EKG]: Secondary | ICD-10-CM | POA: Diagnosis not present

## 2022-06-02 DIAGNOSIS — Z1152 Encounter for screening for COVID-19: Secondary | ICD-10-CM | POA: Insufficient documentation

## 2022-06-02 DIAGNOSIS — R1111 Vomiting without nausea: Secondary | ICD-10-CM | POA: Diagnosis not present

## 2022-06-02 DIAGNOSIS — D72829 Elevated white blood cell count, unspecified: Secondary | ICD-10-CM | POA: Diagnosis not present

## 2022-06-02 DIAGNOSIS — E876 Hypokalemia: Secondary | ICD-10-CM | POA: Diagnosis not present

## 2022-06-02 DIAGNOSIS — R112 Nausea with vomiting, unspecified: Secondary | ICD-10-CM | POA: Diagnosis not present

## 2022-06-02 DIAGNOSIS — E860 Dehydration: Secondary | ICD-10-CM | POA: Diagnosis not present

## 2022-06-02 LAB — CBC WITH DIFFERENTIAL/PLATELET
Abs Immature Granulocytes: 0.05 10*3/uL (ref 0.00–0.07)
Basophils Absolute: 0.1 10*3/uL (ref 0.0–0.1)
Basophils Relative: 1 %
Eosinophils Absolute: 0.1 10*3/uL (ref 0.0–0.5)
Eosinophils Relative: 1 %
HCT: 35.4 % — ABNORMAL LOW (ref 36.0–46.0)
Hemoglobin: 11.8 g/dL — ABNORMAL LOW (ref 12.0–15.0)
Immature Granulocytes: 1 %
Lymphocytes Relative: 14 %
Lymphs Abs: 1.6 10*3/uL (ref 0.7–4.0)
MCH: 28.4 pg (ref 26.0–34.0)
MCHC: 33.3 g/dL (ref 30.0–36.0)
MCV: 85.1 fL (ref 80.0–100.0)
Monocytes Absolute: 0.9 10*3/uL (ref 0.1–1.0)
Monocytes Relative: 8 %
Neutro Abs: 8.4 10*3/uL — ABNORMAL HIGH (ref 1.7–7.7)
Neutrophils Relative %: 75 %
Platelets: 321 10*3/uL (ref 150–400)
RBC: 4.16 MIL/uL (ref 3.87–5.11)
RDW: 15 % (ref 11.5–15.5)
WBC: 11 10*3/uL — ABNORMAL HIGH (ref 4.0–10.5)
nRBC: 0 % (ref 0.0–0.2)

## 2022-06-02 LAB — COMPREHENSIVE METABOLIC PANEL
ALT: 28 U/L (ref 0–44)
AST: 42 U/L — ABNORMAL HIGH (ref 15–41)
Albumin: 3.2 g/dL — ABNORMAL LOW (ref 3.5–5.0)
Alkaline Phosphatase: 70 U/L (ref 38–126)
Anion gap: 11 (ref 5–15)
BUN: <5 mg/dL — ABNORMAL LOW (ref 6–20)
CO2: 24 mmol/L (ref 22–32)
Calcium: 8.2 mg/dL — ABNORMAL LOW (ref 8.9–10.3)
Chloride: 108 mmol/L (ref 98–111)
Creatinine, Ser: 0.31 mg/dL — ABNORMAL LOW (ref 0.44–1.00)
GFR, Estimated: >60 mL/min (ref >60)
Glucose, Bld: 81 mg/dL (ref 70–99)
Potassium: 2.7 mmol/L — CL (ref 3.5–5.1)
Sodium: 143 mmol/L (ref 135–145)
Total Bilirubin: 0.6 mg/dL (ref 0.3–1.2)
Total Protein: 6.2 g/dL — ABNORMAL LOW (ref 6.5–8.1)

## 2022-06-02 LAB — LIPASE, BLOOD: Lipase: 25 U/L (ref 11–51)

## 2022-06-02 LAB — URINALYSIS, ROUTINE W REFLEX MICROSCOPIC
Bilirubin Urine: NEGATIVE
Glucose, UA: NEGATIVE mg/dL
Hgb urine dipstick: NEGATIVE
Ketones, ur: 5 mg/dL — AB
Nitrite: NEGATIVE
Protein, ur: NEGATIVE mg/dL
Specific Gravity, Urine: 1.012 (ref 1.005–1.030)
pH: 7 (ref 5.0–8.0)

## 2022-06-02 LAB — RESP PANEL BY RT-PCR (RSV, FLU A&B, COVID)  RVPGX2
Influenza A by PCR: NEGATIVE
Influenza B by PCR: NEGATIVE
Resp Syncytial Virus by PCR: NEGATIVE
SARS Coronavirus 2 by RT PCR: NEGATIVE

## 2022-06-02 LAB — MAGNESIUM: Magnesium: 1.8 mg/dL (ref 1.7–2.4)

## 2022-06-02 LAB — PREGNANCY, URINE: Preg Test, Ur: NEGATIVE

## 2022-06-02 MED ORDER — POTASSIUM CHLORIDE CRYS ER 20 MEQ PO TBCR: 40.0000 meq | EXTENDED_RELEASE_TABLET | Freq: Two times a day (BID) | ORAL | Status: DC

## 2022-06-02 MED ORDER — POTASSIUM CHLORIDE CRYS ER 20 MEQ PO TBCR: 20.0000 meq | EXTENDED_RELEASE_TABLET | Freq: Two times a day (BID) | ORAL | 0 refills | Status: DC

## 2022-06-02 MED ORDER — DEXTROMETHORPHAN POLISTIREX ER 30 MG/5ML PO SUER
15.0000 mg | Freq: Two times a day (BID) | ORAL | Status: DC
  Administered 2022-06-02: 15 mg via ORAL
  Filled 2022-06-02 (×2): qty 5

## 2022-06-02 MED ORDER — ACETAMINOPHEN 160 MG/5ML PO SOLN
500.0000 mg | Freq: Once | ORAL | Status: AC
  Administered 2022-06-02: 500 mg via ORAL
  Filled 2022-06-02: qty 20

## 2022-06-02 MED ORDER — POTASSIUM CHLORIDE CRYS ER 20 MEQ PO TBCR
40.0000 meq | EXTENDED_RELEASE_TABLET | Freq: Once | ORAL | Status: AC
  Administered 2022-06-02: 40 meq via ORAL
  Filled 2022-06-02: qty 2

## 2022-06-02 MED ORDER — BENZONATATE 100 MG PO CAPS
100.0000 mg | ORAL_CAPSULE | Freq: Once | ORAL | Status: AC
  Administered 2022-06-02: 100 mg via ORAL
  Filled 2022-06-02: qty 1

## 2022-06-02 MED ORDER — BENZONATATE 100 MG PO CAPS: 100.0000 mg | ORAL_CAPSULE | Freq: Four times a day (QID) | ORAL | 0 refills | Status: DC | PRN

## 2022-06-02 NOTE — ED Notes (Signed)
Labeled specimen cup given to pt for U/A collection per MD order. ENMiles 

## 2022-06-02 NOTE — ED Provider Notes (Signed)
[facility] DEPT Provider Note   CSN: [id number] Arrival date & time: 06/02/22  1455    History  Chief Complaint  Patient presents with   Emesis    Melanie Cisneros is a 32 y.o. 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 Pt was in her normal state of health today when she started to feel palpitations. She subsequently started to vomit for about 10-15 minutes.  Vomiting was self-limiting, and was clear.  At the time, she denied any abdominal pain, however is complaining of pain now in the epigastric region.  Pain is located in the upper epigastric region.  Not tender to palpation.  Patient called EMS, and her blood pressure was found to be 90/60 and she was given 500 mL fluid bolus which normalized her blood pressure.  Of note, she received her flu vaccine on Friday, and started feeling ill with cough and intermittent fever.  Fever has subsided however cough is still present.  Her intake has been poor, as patient has had multiple complications with a gastric bypass surgery and feels like it is difficult to eat as it takes her a while.  She is compliant with her omeprazole.      Home Medications Prior to Admission medications   Medication Sig Start Date End Date Taking? Authorizing Provider  benzonatate (TESSALON PERLES) 100 MG capsule Take 1 capsule (100 mg total) by mouth every 6 (six) hours as needed for cough. 06/02/22 06/02/23 Yes Johniece Hornbaker, Marlene Lard, MD  potassium chloride SA (KLOR-CON M) 20 MEQ tablet Take 1 tablet (20 mEq total) by mouth 2 (two) times daily. 06/02/22  Yes Nyonna Hargrove, Marlene Lard, MD  acetaminophen (TYLENOL) 325 MG tablet Take 2 tablets (650 mg total) by mouth every 6 (six) hours as needed for moderate pain. 03/30/22   Jaynee Eagles, PA-C  enoxaparin (LOVENOX) 40 MG/0.4ML injection Inject into the skin. Patient not taking:  Reported on 03/09/2022 02/16/22   [provider]  levofloxacin (LEVAQUIN) 250 MG tablet Take 250 mg by mouth daily. 03/09/22   [provider]  metoprolol tartrate (LOPRESSOR) 25 MG tablet Take by mouth. 02/16/22   [provider]  Multiple Vitamins-Minerals (MULTIVITAMIN ADULTS) TABS Take 1 tablet by mouth daily.    [provider]  omeprazole (PRILOSEC) 40 MG capsule Take by mouth. 01/19/22   [provider]  promethazine (PHENERGAN) 25 MG suppository Place 25 mg rectally every 6 (six) hours as needed. Patient not taking: Reported on 02/28/2022 02/11/22   [provider]  ursodiol (ACTIGALL) 300 MG capsule Take by mouth. 01/19/22   [provider]  albuterol (VENTOLIN HFA) 108 (90 Base) MCG/ACT inhaler Inhale 1-2 puffs into the lungs every 6 (six) hours as needed for wheezing or shortness of breath. Patient not taking: Reported on 04/14/2020 10/04/19 04/27/20  [name], FNP      Allergies    Amoxicillin, Penicillins, and Pork allergy    Review of Systems   Review of Systems  Gastrointestinal:  Positive for vomiting.    Physical Exam Updated Vital Signs BP 126/82   Pulse 83   Temp 98.6 F (37 C)   Resp 18   LMP 05/12/2022   SpO2 99%  Physical Exam Constitutional:      General: She is not in acute distress.    Appearance: Normal appearance.  Cardiovascular:     Rate and Rhythm: Normal rate  and regular rhythm.  Pulmonary:     Effort: Pulmonary effort is normal.     Breath sounds: Normal breath sounds.  Abdominal:     General: Bowel sounds are normal.     Palpations: Abdomen is soft.     Tenderness: There is no abdominal tenderness. There is no guarding.  Neurological:     Mental Status: She is alert.     ED Results / Procedures / Treatments   Labs (all labs ordered are listed, but only abnormal results are displayed) Labs Reviewed  CBC WITH DIFFERENTIAL/PLATELET - Abnormal; Notable for the following  components:      Result Value   WBC 11.0 (*)    Hemoglobin 11.8 (*)    HCT 35.4 (*)    Neutro Abs 8.4 (*)    All other components within normal limits  COMPREHENSIVE METABOLIC PANEL - Abnormal; Notable for the following components:   Potassium 2.7 (*)    BUN <5 (*)    Creatinine, Ser 0.31 (*)    Calcium 8.2 (*)    Total Protein 6.2 (*)    Albumin 3.2 (*)    AST 42 (*)    All other components within normal limits  URINALYSIS, ROUTINE W REFLEX MICROSCOPIC - Abnormal; Notable for the following components:   APPearance HAZY (*)    Ketones, ur 5 (*)    Leukocytes,Ua LARGE (*)    Bacteria, UA MANY (*)    All other components within normal limits  RESP PANEL BY RT-PCR (RSV, FLU A&B, COVID)  RVPGX2  LIPASE, BLOOD  MAGNESIUM  PREGNANCY, URINE  POC URINE PREG, ED  I-STAT BETA HCG BLOOD, ED ([facility], [facility], [facility] ONLY)    EKG EKG Interpretation  Date/Time:  Thursday June 02 2022 15:25:20 EST Ventricular Rate:  75 PR Interval:  133 QRS Duration: 86 QT Interval:  418 QTC Calculation: 467 R Axis:   9 Text Interpretation: Sinus rhythm Borderline T abnormalities, diffuse leads Confirmed by Nanda Quinton [id number]) on 06/02/2022 3:31:35 PM  Radiology No results found.   Medications Ordered in ED Medications  dextromethorphan (DELSYM) 30 MG/5ML liquid 15 mg (15 mg Oral Given 06/02/22 2042)  potassium chloride SA (KLOR-CON M) CR tablet 40 mEq (40 mEq Oral Given 06/02/22 1743)  acetaminophen (TYLENOL) 160 MG/5ML solution 500 mg (500 mg Oral Given 06/02/22 1841)  benzonatate (TESSALON) capsule 100 mg (100 mg Oral Given 06/02/22 2025)    ED Course/ Medical Decision Making/ A&P    Medical Decision Making Patient presents with acute vomiting in the setting of complicated gastric bypass surgery.  She was recently seen in the emergency department for same complaints for which she was not taking her omeprazole at the time, and she was recommended to take her omeprazole.  Differentials currently  include, dehydration, pregnancy.    She has been compliant with her medications.  Patient has had decreased p.o. intake since her bypass surgery, and endorses decreased fluid intake as well.  Blood pressure on EMS arrival was 90/60 which resolved after she was given 500 mL bolus.  Current blood pressure is 122/90.  Labs show a decrease in creatinine .31, and a mild leukocytosis at 11.0.  Leukocytosis likely in the setting of acute vomiting, unsure why creatinine is 0.31 however seems to be chronic issue.  Recommended outpatient follow-up with PCP if further evaluation is warranted, however patient has no issues urinating.  BMP also revealed potassium low at 2.7.  This also seems like a chronic issue since patient got  her bypass surgery and she has been on potassium supplementation however patient does have difficulty swallowing pills which could be an explanation as to why potassium is low.  Will send patient now with potassium supplementation and encourage patient to take her time consuming them.  Given patient's respiratory symptoms, test was done for COVID, which was negative.  Pregnancy test also negative will also treat symptomatically with Delsym and acetaminophen.  Amount and/or Complexity of Data Reviewed Labs:  Decision-making details documented in ED Course.   Final Clinical Impression(s) / ED Diagnoses Final diagnoses:  Vomiting without nausea, unspecified vomiting type    Rx / DC Orders ED Discharge Orders          Ordered    benzonatate (TESSALON PERLES) 100 MG capsule  Every 6 hours PRN        06/02/22 2045    potassium chloride SA (KLOR-CON M) 20 MEQ tablet  2 times daily        06/02/22 2045              Drucie Opitz, MD 06/02/22 2150    Margette Fast, MD 06/02/22 2302

## 2022-06-02 NOTE — Discharge Instructions (Signed)
Was a pleasure meeting you today.  You came in for some heart palpitations and also a few vomiting episodes.  Her blood pressure was also low when he first came in, however when you were given some fluids her blood pressure stabilized.  For your cough, I am sending in [facility] which will hopefully help alleviate your symptoms.  I am also sending you potassium supplements as well.  Please take them twice a day.  The phone number for the clinic that we discussed is [phone].  There will be open tomorrow until noon, so if you give them a phone call they should be able to make you an appointment for follow-up.  Remember to do your best to stay hydrated as well.

## 2022-06-02 NOTE — ED Triage Notes (Signed)
Pt arrives via [facility] c/o emesis. Pt initially hypotensive with EMS, but received BOLUS enroute and normotensive on arrival. Pt also c/o palpitations intermittently.

## 2022-06-02 NOTE — ED Provider Triage Note (Signed)
Emergency Medicine Provider Triage Evaluation Note  NIL Melanie Cisneros , a 32 y.o. female  was evaluated in triage.  Pt complains of patient had gastric bypass in September with subsequent revision 2 weeks later.  She states she has had some intermittent nausea since then.  She has an upcoming appointment at the end of the month with GI to assess for need for endoscopy.  Also for acid reflux.  States a lot of times when she is having this feeling generalized weakness and nausea and not eating like she should she has low potassium and wants to be evaluated.  States her nausea is much better at this time  Review of Systems  Positive: Nausea, occasional palpitations Negative: Fever, abdominal pain  Physical Exam  BP 128/84 (BP Location: Right Arm)   Pulse 73   Temp 99.3 F (37.4 C) (Oral)   Resp 18   LMP 05/12/2022   SpO2 100%  Gen:   Awake, no distress   Resp:  Normal effort  MSK:   Moves extremities without difficulty  Other:    Medical Decision Making  Medically screening exam initiated at 3:22 PM.  Appropriate orders placed.  Mialani D Foland was informed that the remainder of the evaluation will be completed by another provider, this initial triage assessment does not replace that evaluation, and the importance of remaining in the ED until their evaluation is complete.     Gwenevere Abbot, [place] 06/02/22 1524

## 2022-06-09 ENCOUNTER — Encounter: Payer: Self-pay | Admitting: Internal Medicine

## 2022-06-09 ENCOUNTER — Ambulatory Visit (INDEPENDENT_AMBULATORY_CARE_PROVIDER_SITE_OTHER): Payer: Medicaid Other | Admitting: Internal Medicine

## 2022-06-09 VITALS — BP 118/74 | HR 88 | Ht 65.0 in | Wt 204.0 lb

## 2022-06-09 DIAGNOSIS — Z9884 Bariatric surgery status: Secondary | ICD-10-CM

## 2022-06-09 DIAGNOSIS — K219 Gastro-esophageal reflux disease without esophagitis: Secondary | ICD-10-CM

## 2022-06-09 DIAGNOSIS — R112 Nausea with vomiting, unspecified: Secondary | ICD-10-CM | POA: Diagnosis not present

## 2022-06-09 MED ORDER — OMEPRAZOLE 40 MG PO CPDR: 40.0000 mg | DELAYED_RELEASE_CAPSULE | Freq: Two times a day (BID) | ORAL | 3 refills | Status: DC

## 2022-06-09 NOTE — Patient Instructions (Signed)
_______________________________________________________  If your blood pressure at your visit was 140/90 or greater, please contact your primary care physician to follow up on this.  _______________________________________________________  If you are age 31 or older, your body mass index should be between 23-30. Your Body mass index is 33.95 kg/m. If this is out of the aforementioned range listed, please consider follow up with your Primary Care Provider.  If you are age 66 or younger, your body mass index should be between 19-25. Your Body mass index is 33.95 kg/m. If this is out of the aformentioned range listed, please consider follow up with your Primary Care Provider.   ________________________________________________________  The [facility] providers would like to encourage you to use [facility] to communicate with providers for non-urgent requests or questions.  Due to long hold times on the telephone, sending your provider a message by [facility] may be a faster and more efficient way to get a response.  Please allow 48 business hours for a response.  Please remember that this is for non-urgent requests.  _______________________________________________________  We have sent the following medications to your pharmacy for you to pick up at your convenience:  Omeprazole - twice a day   You have been scheduled for an endoscopy. Please follow written instructions given to you at your visit today. If you use inhalers (even only as needed), please bring them with you on the day of your procedure.

## 2022-06-09 NOTE — Progress Notes (Signed)
HISTORY OF PRESENT ILLNESS:  Melanie Cisneros is a 32 y.o. 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  Her surgeon was Dr. Norris Cross.  At that time patient has had issues with thick saliva for which she constantly spits in the basin.  She has lost 100 pounds since her initial surgery.  She tells me that she is able to keep food down.  She had fish, spinach, and macaroni and cheese earlier today.  Throughout the course of this evaluation she spits in the basin.  It sounds like she has seen several doctors regarding her postoperative symptoms.  Saw gastroenterologist in the [facility] system for upper endoscopy in August.  Was found to have H. pylori.  Not clear if this was treated.  She has been to our emergency room with dehydration and electrolyte abnormalities.  Recent ER evaluation May 17, 2022 with potassium of 2.7.  Hemoglobin 11.8.  Sodium 143.  Primary care physician, Juluis Mire, MD, referred her to this clinic.  The patient is accompanied by her aunt.  Patient denies abdominal pain.  States it is difficult to eat.  Prior ER evaluation in May 20, 2022 with complaints of abdominal pain and vomiting resulted in a CT scan.  There were no acute findings post gastric bypass.  She does have GERD with heartburn.  She takes omeprazole capsule which she opens up and places on her tongue once daily.  Still some breakthrough symptoms.  No dysphagia.  REVIEW OF SYSTEMS:  All non-GI ROS negative was otherwise stated in the HPI except for allergy trouble  Past Medical History:  Diagnosis Date   Bacterial vaginosis    Chlamydia    Gonorrhea    Obesity    UTI (lower urinary tract infection)     Past Surgical History:  Procedure Laterality Date   GASTRIC BYPASS     WISDOM TOOTH EXTRACTION      Social  History Melanie Cisneros  reports that she has never smoked. She has never used smokeless tobacco. She reports that she does not currently use alcohol. She reports that she does not use drugs.  family history includes Diabetes in her father; Hypertension in her mother.  Allergies  Allergen Reactions   Amoxicillin Anaphylaxis, Hives and Other (See Comments)    Has patient had a PCN reaction causing immediate rash, facial/tongue/throat swelling, SOB or lightheadedness with hypotension: Yes Has patient had a PCN reaction causing severe rash involving mucus membranes or skin necrosis: No Has patient had a PCN reaction that required hospitalization No Has patient had a PCN reaction occurring within the last 10 years: No If all of the above answers are "NO", then may proceed with Cephalosporin use.   Penicillins Anaphylaxis, Hives and Other (See Comments)    Has patient had a PCN reaction causing immediate rash, facial/tongue/throat swelling, SOB or lightheadedness with hypotension: Yes Has patient had a PCN reaction causing severe rash involving mucus membranes or skin necrosis: No Has patient had a PCN reaction that required hospitalization No Has patient had a PCN reaction occurring within the last 10 years: No If all of the above answers are "NO", then may proceed with Cephalosporin use.   Pork Allergy Other (See Comments)    Pt has tolerated multiple doses of heparin       PHYSICAL EXAMINATION: Vital signs: BP 118/74   Pulse  88   Ht '5\' 5"'$  (1.651 m)   Wt 204 lb (92.5 kg)   LMP 05/12/2022   BMI 33.95 kg/m   Constitutional: generally well-appearing, no acute distress.  Constantly spitting in a Gatorade container Psychiatric: alert and oriented x 3, cooperative Eyes: extraocular movements intact, anicteric, conjunctiva pink Mouth: oral pharynx moist, no lesions Neck: supple no lymphadenopathy Cardiovascular: heart regular rate and rhythm, no murmur Lungs: clear to auscultation  bilaterally Abdomen: soft, obese, nontender, nondistended, no obvious ascites, no peritoneal signs, normal bowel sounds, no organomegaly Rectal: Omitted Extremities: no clubbing, cyanosis, or lower extremity edema bilaterally Skin: no lesions on visible extremities Neuro: No focal deficits.  Cranial nerves intact  ASSESSMENT:  1.  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  Complicated by gastric outlet obstruction.  Converted to gastrojejunostomy October 2023. 2.  CT scan May 20, 2022 without acute abnormalities.  Normal postoperative findings 3.  Presents to this office with a chief complaint of habitual spitting.  She has a sensation of thick mucus.  I believe this is functional 4.  GERD.  Breakthrough symptoms 5.  H. pylori, history of.  Question treatment   PLAN:  1.  Told to drink plenty of liquids to stay hydrated 2.  Told to stop spitting however to avoid electrolyte abnormalities 3.  Increase omeprazole to 40 mg twice daily.  Prescription written.  Told proper way to open capsule.  Placed on applesauce 4.  Schedule upper endoscopy with biopsies.  Rule out any structural abnormalities to explain intermittent problems with pain, vomiting, nausea, etc.The nature of the procedure, as well as the risks, benefits, and alternatives were carefully and thoroughly reviewed with the patient. Ample time for discussion and questions allowed. The patient understood, was satisfied, and agreed to proceed.  A total time of 65 minutes was spent preparing to see the patient, reviewing a multitude of outside records, operative reports, endoscopy reports, laboratories, CTs, and pathology.  Obtaining comprehensive history, performing medically appropriate physical examination, counseling and educating the patient and her aunt regarding the above listed issues, ordering medication, ordering endoscopic procedure, and documenting clinical information in the health record

## 2022-06-13 ENCOUNTER — Encounter: Payer: Self-pay | Admitting: Internal Medicine

## 2022-06-13 ENCOUNTER — Ambulatory Visit (AMBULATORY_SURGERY_CENTER): Payer: Medicaid Other | Admitting: Internal Medicine

## 2022-06-13 VITALS — BP 90/55 | HR 73 | Temp 97.7°F | Resp 14 | Ht 65.0 in | Wt 204.0 lb

## 2022-06-13 DIAGNOSIS — K219 Gastro-esophageal reflux disease without esophagitis: Secondary | ICD-10-CM | POA: Diagnosis not present

## 2022-06-13 DIAGNOSIS — R112 Nausea with vomiting, unspecified: Secondary | ICD-10-CM

## 2022-06-13 DIAGNOSIS — Z9884 Bariatric surgery status: Secondary | ICD-10-CM | POA: Diagnosis not present

## 2022-06-13 DIAGNOSIS — E669 Obesity, unspecified: Secondary | ICD-10-CM | POA: Diagnosis not present

## 2022-06-13 DIAGNOSIS — K319 Disease of stomach and duodenum, unspecified: Secondary | ICD-10-CM | POA: Diagnosis not present

## 2022-06-13 MED ORDER — SODIUM CHLORIDE 0.9 % IV SOLN: 500.0000 mL | Freq: Once | INTRAVENOUS | Status: DC

## 2022-06-13 NOTE — Progress Notes (Signed)
Pt's states no medical or surgical changes since previsit or office visit. 

## 2022-06-13 NOTE — Patient Instructions (Signed)
  Await pathology results.  Office follow up visit with Dr. Henrene Pastor in 6 weeks.  Continue present medications.  YOU HAD AN ENDOSCOPIC PROCEDURE TODAY AT [facility]:   Refer to the procedure report that was given to you for any specific questions about what was found during the examination.  If the procedure report does not answer your questions, please call your gastroenterologist to clarify.  If you requested that your care partner not be given the details of your procedure findings, then the procedure report has been included in a sealed envelope for you to review at your convenience later.  YOU SHOULD EXPECT: Some feelings of bloating in the abdomen. Passage of more gas than usual.  Walking can help get rid of the air that was put into your GI tract during the procedure and reduce the bloating. If you had a lower endoscopy (such as a colonoscopy or flexible sigmoidoscopy) you may notice spotting of blood in your stool or on the toilet paper. If you underwent a bowel prep for your procedure, you may not have a normal bowel movement for a few days.  Please Note:  You might notice some irritation and congestion in your nose or some drainage.  This is from the oxygen used during your procedure.  There is no need for concern and it should clear up in a day or so.  SYMPTOMS TO REPORT IMMEDIATELY:    Following upper endoscopy (EGD)  Vomiting of blood or coffee ground material  New chest pain or pain under the shoulder blades  Painful or persistently difficult swallowing  New shortness of breath  Fever of 100F or higher  Black, tarry-looking stools  For urgent or emergent issues, a gastroenterologist can be reached at any hour by calling [phone]. Do not use MyChart messaging for urgent concerns.    DIET:  We do recommend a small meal at first, but then you may proceed to your regular diet.  Drink plenty of fluids but you should avoid alcoholic beverages for 24  hours.  ACTIVITY:  You should plan to take it easy for the rest of today and you should NOT DRIVE or use heavy machinery until tomorrow (because of the sedation medicines used during the test).    FOLLOW UP: Our staff will call the number listed on your records the next business day following your procedure.  We will call around 7:15- 8:00 am to check on you and address any questions or concerns that you may have regarding the information given to you following your procedure. If we do not reach you, we will leave a message.     If any biopsies were taken you will be contacted by phone or by letter within the next 1-3 weeks.  Please call us at [phone] if you have not heard about the biopsies in 3 weeks.    SIGNATURES/CONFIDENTIALITY: You and/or your care partner have signed paperwork which will be entered into your electronic medical record.  These signatures attest to the fact that that the information above on your After Visit Summary has been reviewed and is understood.  Full responsibility of the confidentiality of this discharge information lies with you and/or your care-partner.

## 2022-06-13 NOTE — Op Note (Signed)
[facility] Patient Name: Melanie Cisneros Procedure Date: 06/13/2022 8:46 AM MRN: [id number] Endoscopist: Docia Chuck. Henrene Pastor , MD, [id number] Age: 32 Referring MD:  Date of Birth: [birth year] Gender: Female Account #: [id number] Procedure:                Upper GI endoscopy with biopsies Indications:              Esophageal reflux, Nausea with vomiting, reported                            history of H. pylori. Habitual spitting has stopped                            since office visit Medicines:                Monitored Anesthesia Care Procedure:                Pre-Anesthesia Assessment:                           - Prior to the procedure, a History and Physical                            was performed, and patient medications and                            allergies were reviewed. The patient's tolerance of                            previous anesthesia was also reviewed. The risks                            and benefits of the procedure and the sedation                            options and risks were discussed with the patient.                            All questions were answered, and informed consent                            was obtained. Prior Anticoagulants: The patient has                            taken no anticoagulant or antiplatelet agents. ASA                            Grade Assessment: II - A patient with mild systemic                            disease. After reviewing the risks and benefits,                            the patient was deemed in satisfactory condition to  undergo the procedure.                           After obtaining informed consent, the endoscope was                            passed under direct vision. Throughout the                            procedure, the patient's blood pressure, pulse, and                            oxygen saturations were monitored continuously. The                            GIF HQ190  #[id number] was introduced through the                            mouth, and advanced to the jejunum. The upper GI                            endoscopy was accomplished without difficulty. The                            patient tolerated the procedure well. Scope In: Scope Out: Findings:                 The esophagus was normal.                           The stomach revealed evidence of prior sleeve                            gastrectomy with Billroth II anastomosis. Biopsies                            were taken with a cold forceps for histology.                           The examined afferent and efferent jejunal limbs                            were normal.                           The cardia and gastric fundus were normal on                            retroflexion. Complications:            No immediate complications. Estimated Blood Loss:     Estimated blood loss: none. Impression:               1. Normal surgical anatomy post gastric sleeve                            surgery converted to  Billroth II type anatomy                           2. GERD. Recommendation:           - Patient has a contact number available for                            emergencies. The signs and symptoms of potential                            delayed complications were discussed with the                            patient. Return to normal activities tomorrow.                            Written discharge instructions were provided to the                            patient.                           - Resume previous diet.                           - Continue present medications.                           - Office follow-up with Dr. Henrene Pastor in 6 weeks                           - Await pathology results. Docia Chuck. Henrene Pastor, MD 06/13/2022 9:05:27 AM This report has been signed electronically.

## 2022-06-13 NOTE — Progress Notes (Signed)
Called to room to assist during endoscopic procedure.  Patient ID and intended procedure confirmed with present staff. Received instructions for my participation in the procedure from the performing physician.  

## 2022-06-13 NOTE — Progress Notes (Signed)
Pt resting comfortably. VSS. Airway intact. SBAR complete to RN. All questions answered.   

## 2022-06-13 NOTE — Progress Notes (Signed)
HISTORY OF PRESENT ILLNESS:   Melanie Cisneros is a 32 y.o. 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  Her surgeon was Dr. Norris Cross.  At that time patient has had issues with thick saliva for which she constantly spits in the basin.  She has lost 100 pounds since her initial surgery.  She tells me that she is able to keep food down.  She had fish, spinach, and macaroni and cheese earlier today.  Throughout the course of this evaluation she spits in the basin.  It sounds like she has seen several doctors regarding her postoperative symptoms.  Saw gastroenterologist in the [facility] system for upper endoscopy in August.  Was found to have H. pylori.  Not clear if this was treated.  She has been to our emergency room with dehydration and electrolyte abnormalities.  Recent ER evaluation May 17, 2022 with potassium of 2.7.  Hemoglobin 11.8.  Sodium 143.  Primary care physician, Juluis Mire, MD, referred her to this clinic.  The patient is accompanied by her aunt.   Patient denies abdominal pain.  States it is difficult to eat.  Prior ER evaluation in May 20, 2022 with complaints of abdominal pain and vomiting resulted in a CT scan.  There were no acute findings post gastric bypass.   She does have GERD with heartburn.  She takes omeprazole capsule which she opens up and places on her tongue once daily.  Still some breakthrough symptoms.  No dysphagia.   REVIEW OF SYSTEMS:   All non-GI ROS negative was otherwise stated in the HPI except for allergy trouble       Past Medical History:  Diagnosis Date   Bacterial vaginosis     Chlamydia     Gonorrhea     Obesity     UTI (lower urinary tract infection)             Past Surgical History:  Procedure Laterality Date   GASTRIC BYPASS       WISDOM TOOTH EXTRACTION           Social History Cyril D Sponsel  reports that she has never smoked. She has never used smokeless tobacco. She reports that she does not currently use alcohol. She reports that she does not use drugs.   family history includes Diabetes in her father; Hypertension in her mother.        Allergies  Allergen Reactions   Amoxicillin Anaphylaxis, Hives and Other (See Comments)      Has patient had a PCN reaction causing immediate rash, facial/tongue/throat swelling, SOB or lightheadedness with hypotension: Yes Has patient had a PCN reaction causing severe rash involving mucus membranes or skin necrosis: No Has patient had a PCN reaction that required hospitalization No Has patient had a PCN reaction occurring within the last 10 years: No If all of the above answers are "NO", then may proceed with Cephalosporin use.   Penicillins Anaphylaxis, Hives and Other (See Comments)      Has patient had a PCN reaction causing immediate rash, facial/tongue/throat swelling, SOB or lightheadedness with hypotension: Yes Has patient had a PCN reaction causing severe rash involving mucus membranes or skin necrosis: No Has patient had a PCN reaction that required hospitalization No Has patient had a PCN reaction occurring within the last 10 years: No If all of the above answers are "  NO", then may proceed with Cephalosporin use.   Pork Allergy Other (See Comments)      Pt has tolerated multiple doses of heparin          PHYSICAL EXAMINATION: Vital signs: BP 118/74   Pulse 88   Ht '5\' 5"'$  (1.651 m)   Wt 204 lb (92.5 kg)   LMP 05/12/2022   BMI 33.95 kg/m   Constitutional: generally well-appearing, no acute distress.  Constantly spitting in a Gatorade container Psychiatric: alert and oriented x 3, cooperative Eyes: extraocular movements intact, anicteric, conjunctiva pink Mouth: oral pharynx moist, no lesions Neck: supple no lymphadenopathy Cardiovascular: heart regular rate and rhythm, no  murmur Lungs: clear to auscultation bilaterally Abdomen: soft, obese, nontender, nondistended, no obvious ascites, no peritoneal signs, normal bowel sounds, no organomegaly Rectal: Omitted Extremities: no clubbing, cyanosis, or lower extremity edema bilaterally Skin: no lesions on visible extremities Neuro: No focal deficits.  Cranial nerves intact   ASSESSMENT:   1.  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  Complicated by gastric outlet obstruction.  Converted to gastrojejunostomy October 2023. 2.  CT scan May 20, 2022 without acute abnormalities.  Normal postoperative findings 3.  Presents to this office with a chief complaint of habitual spitting.  She has a sensation of thick mucus.  I believe this is functional 4.  GERD.  Breakthrough symptoms 5.  H. pylori, history of.  Question treatment     PLAN:   1.  Told to drink plenty of liquids to stay hydrated 2.  Told to stop spitting however to avoid electrolyte abnormalities 3.  Increase omeprazole to 40 mg twice daily.  Prescription written.  Told proper way to open capsule.  Placed on applesauce 4.  Schedule upper endoscopy with biopsies.  Rule out any structural abnormalities to explain intermittent problems with pain, vomiting, nausea, etc.The nature of the procedure, as well as the risks, benefits, and alternatives were carefully and thoroughly reviewed with the patient. Ample time for discussion and questions allowed. The patient understood, was satisfied, and agreed to proceed.

## 2022-06-14 ENCOUNTER — Telehealth: Payer: Self-pay | Admitting: *Deleted

## 2022-06-14 NOTE — Telephone Encounter (Signed)
Attempted f/u phone call. No answer. Left message. °

## 2022-06-15 ENCOUNTER — Encounter: Payer: Self-pay | Admitting: Internal Medicine

## 2022-06-30 ENCOUNTER — Other Ambulatory Visit: Payer: Self-pay | Admitting: Student

## 2022-07-05 ENCOUNTER — Ambulatory Visit: Payer: Medicaid Other | Admitting: Student

## 2022-07-05 ENCOUNTER — Encounter: Payer: Self-pay | Admitting: Student

## 2022-07-05 VITALS — BP 112/68 | HR 67 | Ht 65.0 in | Wt 200.6 lb

## 2022-07-05 DIAGNOSIS — F419 Anxiety disorder, unspecified: Secondary | ICD-10-CM | POA: Diagnosis not present

## 2022-07-05 DIAGNOSIS — E876 Hypokalemia: Secondary | ICD-10-CM | POA: Diagnosis not present

## 2022-07-05 DIAGNOSIS — D649 Anemia, unspecified: Secondary | ICD-10-CM

## 2022-07-05 NOTE — Progress Notes (Signed)
    SUBJECTIVE:   CHIEF COMPLAINT / HPI:   Melanie Cisneros is a 32 year-old female here to establish care with PCP.  She feels that she is having anxiety. When she eats, her heart rate rises and she feels hot. She has to take deep breaths to calm down. She said a paramedic told her she likely  She was having hypokalemia to 2.7, and was placed on potassium replacement.  Medical diagnoses: GERD Daily Medications: Allergies: Surgical hx: Gastric bypass, Sleeve castrectomy coverted to gastrojujostomy d/ot gastric outlet obstruction Family hx:    Lives with:  Smoking/Vaping:  Never Alcohol: Denies Marijuana/ilicit substances:  Denies Exercise:     PERTINENT  PMH / PSH: ***  OBJECTIVE:   There were no vitals taken for this visit. ***   ASSESSMENT/PLAN:   No problem-specific Assessment & Plan notes found for this encounter.     Orvis Brill, [facility]    {    This will disappear when note is signed, click to select method of visit    :1}

## 2022-07-05 NOTE — Patient Instructions (Addendum)
It was great seeing you today.  I will call you with your blood work results.  Make an appointment at the front desk for [place] to talk about your knee pain.   If you have any questions or concerns, please feel free to call the clinic.   Have a wonderful day,  Dr. Orvis Brill [facility] [phone]     Therapy and Counseling Resources Most providers on this list will take Medicaid. Patients with commercial insurance or Medicare should contact their insurance company to get a list of in network providers.  [employer] (takes children) Location 1: [address], [place], [place] [address] Location 2: [place], [place] [address] [place] ([place] speaking therapist available)(habla espanol)(take medicare and medicaid)  [place], [place], [place] [address], [place] [email or web address] [phone]  [facility]:Psychiatry and Counseling [address] [place], [place] [address] [place], [place], [place] [address]      [phone]  [place] (spanish available) [place],  [address] [place] (take [facility] and medicare) [address] [place],  [address]       [phone]     [place] (virtual only) [phone]  [facility] [address], [place], [place]  [facility]:  [place]. [place] [phone]  [facility]:  [place] [address] [phone]  [facility] (under & uninsured) [address], [place] [phone]    [email or web address]    [place] [address]  [place]    [phone]  [facility] [place]     Phone:   [phone]     [place]  [place] [address]      [place], [place] ext [place]: [place], [place], [place]   [place] ([place] therapist) [email or web address]  [place] [address]   [place] [address]    [phone]    [facility]   [address],  [place], [place]   [place]  [place]  [facility]  [address] [place], [place]        [phone]  Open Access/Walk In Clinic under & uninsured  [facility]  [address] [place], [place] Crisis [phone]  [facility] [place],  ([place])   [place], [place]: [phone]) 8:30 - 12; 1 - 2:30  [facility] of the [place],  [place], [place]    ([phone]):8:30 - 12; 2 - 3PM  [facility] [employer],  [address]  [place]; [phone]):   Mon - Fri 8 AM - 5 PM  [facility] [place]  MWF 12:30 to 3:00 or call to schedule an appointment  [phone]  Specific Provider options Psychology Today  [email or web address] click on find a therapist  enter your zip code left side and select or tailor a therapist for your specific need.   [facility] Provider Directory [email or web address]  (  Medicaid)   Follow all drop down to find a provider  [place] or [email or web address] [address], [place], [place] Recovery support and educational   24- Hour Availability:   [facility]  [address] [place], [place] Crisis [phone]  [facility] of the [employer] [phone]  [place]  [phone]   [place]  [phone] (after hours)  Therapeutic Alternative/Mobile Crisis   [phone]  [place] National Suicide Hotline  [phone] [place]am[place]ina Monks)  Call 911 or go to emergency room  [facility]  [phone]);  [place] and [employer]  [phone]); [place], [place], [place], [place], [place], [place], [place]

## 2022-07-06 DIAGNOSIS — E876 Hypokalemia: Secondary | ICD-10-CM | POA: Insufficient documentation

## 2022-07-06 DIAGNOSIS — F419 Anxiety disorder, unspecified: Secondary | ICD-10-CM | POA: Insufficient documentation

## 2022-07-06 LAB — CBC

## 2022-07-06 NOTE — Assessment & Plan Note (Signed)
Symptoms consistent with anxious mood and/or panic.  No SI. She is able to identify sources of joy in her life. We discussed the mind-body connection. Provided her with therapy resources that accept Medicaid and encouraged her to start CBT/psychotherapy. She will follow-up with me in the next couple weeks so I can see how she is doing.

## 2022-07-06 NOTE — Assessment & Plan Note (Signed)
Hypokalemia of 2.7 on prior labs prior to establishing care. She completed her repletion Obtained BMP which showed that her potassium is now within normal limits at 3.5

## 2022-07-09 LAB — CBC

## 2022-07-12 ENCOUNTER — Ambulatory Visit: Payer: Medicaid Other | Admitting: Student

## 2022-07-14 LAB — CBC

## 2022-07-14 LAB — BASIC METABOLIC PANEL
BUN/Creatinine Ratio: 9 (ref 9–23)
BUN: 4 mg/dL — ABNORMAL LOW (ref 6–20)
CO2: 22 mmol/L (ref 20–29)
Calcium: 9.5 mg/dL (ref 8.7–10.2)
Chloride: 102 mmol/L (ref 96–106)
Creatinine, Ser: 0.47 mg/dL — ABNORMAL LOW (ref 0.57–1.00)
Glucose: 73 mg/dL (ref 70–99)
Potassium: 3.5 mmol/L (ref 3.5–5.2)
Sodium: 141 mmol/L (ref 134–144)
eGFR: 131 mL/min/{1.73_m2} (ref >59)

## 2022-07-26 ENCOUNTER — Encounter: Payer: Self-pay | Admitting: Internal Medicine

## 2022-07-26 ENCOUNTER — Ambulatory Visit: Payer: Medicaid Other | Admitting: Internal Medicine

## 2022-07-26 VITALS — BP 122/76 | HR 83 | Ht 65.0 in | Wt 198.0 lb

## 2022-07-26 DIAGNOSIS — Z9884 Bariatric surgery status: Secondary | ICD-10-CM

## 2022-07-26 DIAGNOSIS — K219 Gastro-esophageal reflux disease without esophagitis: Secondary | ICD-10-CM

## 2022-07-26 DIAGNOSIS — R112 Nausea with vomiting, unspecified: Secondary | ICD-10-CM | POA: Diagnosis not present

## 2022-07-26 MED ORDER — OMEPRAZOLE 40 MG PO CPDR: 40.0000 mg | DELAYED_RELEASE_CAPSULE | Freq: Two times a day (BID) | ORAL | 3 refills | Status: DC

## 2022-07-26 NOTE — Patient Instructions (Signed)
_______________________________________________________  If your blood pressure at your visit was 140/90 or greater, please contact your primary care physician to follow up on this.  _______________________________________________________  If you are age 31 or older, your body mass index should be between 23-30. Your Body mass index is 32.95 kg/m. If this is out of the aforementioned range listed, please consider follow up with your Primary Care Provider.  If you are age 40 or younger, your body mass index should be between 19-25. Your Body mass index is 32.95 kg/m. If this is out of the aformentioned range listed, please consider follow up with your Primary Care Provider.   ________________________________________________________  The [facility] providers would like to encourage you to use [facility] to communicate with providers for non-urgent requests or questions.  Due to long hold times on the telephone, sending your provider a message by [facility] may be a faster and more efficient way to get a response.  Please allow 48 business hours for a response.  Please remember that this is for non-urgent requests.  _______________________________________________________  We have sent the following medications to your pharmacy for you to pick up at your convenience:  Pantoprazole  Please follow up in one year

## 2022-07-26 NOTE — Progress Notes (Signed)
HISTORY OF PRESENT ILLNESS:  Melanie Cisneros is a 32 y.o. 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  She was seen in this office June 09, 2022 regarding habitual spitting and GERD with breakthrough symptoms.  See that dictation.  She was told to stop spitting, and did.  She was placed on omeprazole 40 mg twice daily and scheduled for endoscopy which was performed June 13, 2022.  She was found to have normal postoperative anatomy.  Gastric biopsies were negative for H. pylori.  Follow-up at this time arranged.  Patient is pleased to tell me that she is feeling well.  No further problems with spitting behavior.  No reflux symptoms.  No GI complaints.  REVIEW OF SYSTEMS:  All non-GI ROS negative.  Past Medical History:  Diagnosis Date   Bacterial vaginosis    Chlamydia    Gonorrhea    Obesity    UTI (lower urinary tract infection)     Past Surgical History:  Procedure Laterality Date   GASTRIC BYPASS     WISDOM TOOTH EXTRACTION      Social History Melanie Cisneros  reports that she has never smoked. She has never used smokeless tobacco. She reports that she does not currently use alcohol. She reports that she does not use drugs.  family history includes Diabetes in her father; Hypertension in her mother.  Allergies  Allergen Reactions   Amoxicillin Anaphylaxis, Hives and Other (See Comments)    Has patient had a PCN reaction causing immediate rash, facial/tongue/throat swelling, SOB or lightheadedness with hypotension: Yes Has patient had a PCN reaction causing severe rash involving mucus membranes or skin necrosis: No Has patient had a PCN reaction that required hospitalization No Has patient had a PCN reaction occurring within the last 10 years: No If all of the above answers are "NO", then may proceed  with Cephalosporin use.   Penicillins Anaphylaxis, Hives and Other (See Comments)    Has patient had a PCN reaction causing immediate rash, facial/tongue/throat swelling, SOB or lightheadedness with hypotension: Yes Has patient had a PCN reaction causing severe rash involving mucus membranes or skin necrosis: No Has patient had a PCN reaction that required hospitalization No Has patient had a PCN reaction occurring within the last 10 years: No If all of the above answers are "NO", then may proceed with Cephalosporin use.   Pork Allergy Other (See Comments)    Pt has tolerated multiple doses of heparin       PHYSICAL EXAMINATION:  Vital signs: BP 122/76   Pulse 83   Ht '5\' 5"'$  (1.651 m)   Wt 198 lb (89.8 kg)   LMP 07/03/2022 (Exact Date)   BMI 32.95 kg/m  General: Well-developed, well-nourished, no acute distress HEENT: Anicteric Abdomen: Not reexamined. Extremities: Abnormalities of visible extremities Psychiatric: alert and oriented x3. Cooperative  ASSESSMENT:  1.  GERD.  Refractory symptoms improved on twice daily PPI 2.  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 3.  History of gastric bypass with revision as previously described 4.  Morbid obesity   PLAN:  1.  Reflux precautions 2.  Continue twice daily PPI.  Prescription refilled.  Medication risk reviewed 3.  Routine office follow-up 1 year.  Contact the office in the interim for any questions or problems 4.  Resume general medical care with PCP

## 2022-08-06 DIAGNOSIS — R457 State of emotional shock and stress, unspecified: Secondary | ICD-10-CM | POA: Diagnosis not present

## 2022-08-06 DIAGNOSIS — R002 Palpitations: Secondary | ICD-10-CM | POA: Diagnosis not present

## 2022-08-06 DIAGNOSIS — R450 Nervousness: Secondary | ICD-10-CM | POA: Diagnosis not present

## 2022-08-15 ENCOUNTER — Other Ambulatory Visit: Payer: Medicaid Other

## 2022-08-15 NOTE — Patient Instructions (Signed)
Visit Information  Melanie Cisneros was given information about Medicaid Managed Care team care coordination services as a part of their Healthy [facility] Medicaid benefit. Melanie Cisneros verbally consented to engagement with the [facility] Managed Care team.   If you are experiencing a medical emergency, please call 911 or report to your local emergency department or urgent care.   If you have a non-emergency medical problem during routine business hours, please contact your provider's office and ask to speak with a nurse.   For questions related to your Healthy [facility] health plan, please call: [phone] or visit the homepage here: [email or web address]  If you would like to schedule transportation through your Healthy [facility] plan, please call the following number at least 2 days in advance of your appointment: [phone]  For information about your ride after you set it up, call Ride Assist at [phone]. Use this number to activate a Will Call pickup, or if your transportation is late for a scheduled pickup. Use this number, too, if you need to make a change or cancel a previously scheduled reservation.  If you need transportation services right away, call [phone]. The after-hours call center is staffed 24 hours to handle ride assistance and urgent reservation requests (including discharges) 365 days a year. Urgent trips include sick visits, hospital discharge requests and life-sustaining treatment.  Call the [place] at [phone], at any time, 24 hours a day, 7 days a week. If you are in danger or need immediate medical attention call 911.  If you would like help to quit smoking, call 1-800-QUIT-NOW (509)788-3534) OR Espaol: 1-855-Djelo-Ya QO:409462) o para ms informacin haga clic aqu or Text READY to 200-400 to register via text  Melanie Cisneros - following are the goals we discussed in your visit today:   Goals  Addressed   None      Social Worker will follow up in 30 days.   Mickel Fuchs, Arita Miss, MHA [facility]  Managed Medicaid Social Worker [phone]   Following is a copy of your plan of care:  There are no care plans that you recently modified to display for this patient.

## 2022-08-15 NOTE — Patient Outreach (Signed)
Medicaid Managed Care Social Work Note  08/15/2022 Name:  Melanie Cisneros MRN:  [id number] DOB:  [birth year]  Melanie Cisneros is an 32 y.o. year old female who is a primary patient of Orvis Brill, DO.  The Medicaid Managed Care Coordination team was consulted for assistance with:  Community Resources   Melanie Cisneros was given information about Medicaid Managed Care Coordination team services today. Melanie Cisneros Patient agreed to services and verbal consent obtained.  Engaged with patient  for by telephone forinitial visit in response to referral for case management and/or care coordination services.   Assessments/Interventions:  Review of past medical history, allergies, medications, health status, including review of consultants reports, laboratory and other test data, was performed as part of comprehensive evaluation and provision of chronic care management services.  SDOH: (Social Determinant of Health) assessments and interventions performed: SDOH Interventions    Flowsheet Row Office Visit from 11/09/2021 in [facility]  SDOH Interventions   Food Insecurity Interventions Information systems manager ([facility] only)     BSW completed a telephone outreach with patient, she stated she had surgury and then had to get another surgery which caused her to miss work and she got behind on her utilizes. Patient states she has contacted most resources BSW has. BSW will send patient resources she does not have and direct her to [facility] for assistance. No other resources are needed at this time.  Advanced Directives Status:  Not addressed in this encounter.  Care Plan                 Allergies  Allergen Reactions   Amoxicillin Anaphylaxis, Hives and Other (See Comments)    Has patient had a PCN reaction causing immediate rash, facial/tongue/throat swelling, SOB or lightheadedness with hypotension: Yes Has patient had a PCN reaction  causing severe rash involving mucus membranes or skin necrosis: No Has patient had a PCN reaction that required hospitalization No Has patient had a PCN reaction occurring within the last 10 years: No If all of the above answers are "NO", then may proceed with Cephalosporin use.   Penicillins Anaphylaxis, Hives and Other (See Comments)    Has patient had a PCN reaction causing immediate rash, facial/tongue/throat swelling, SOB or lightheadedness with hypotension: Yes Has patient had a PCN reaction causing severe rash involving mucus membranes or skin necrosis: No Has patient had a PCN reaction that required hospitalization No Has patient had a PCN reaction occurring within the last 10 years: No If all of the above answers are "NO", then may proceed with Cephalosporin use.   Pork Allergy Other (See Comments)    Pt has tolerated multiple doses of heparin    Medications Reviewed Today     Reviewed by Melanie Shipper, MD (Physician) on 07/26/22 at 1118  Med List Status: <None>   Medication Order Taking? Sig Documenting Provider Last Dose Status Informant  acetaminophen (TYLENOL) 325 MG tablet [id number] No Take 2 tablets (650 mg total) by mouth every 6 (six) hours as needed for moderate pain.  Patient not taking: Reported on 07/26/2022   Jaynee Eagles, PA-C Not Taking Active            Med Note (RATLIFF, Tyro Jul 26, 2022 10:26 AM) As needed  benzonatate (TESSALON PERLES) 100 MG capsule [id number] No Take 1 capsule (100 mg total) by mouth every 6 (six) hours as needed for cough.  Patient  not taking: Reported on 07/26/2022   Nooruddin, Marlene Lard, MD Not Taking Active   metoprolol tartrate (LOPRESSOR) 25 MG tablet [id number] Yes Take by mouth. [provider] Taking Active Self  Multiple Vitamins-Minerals (MULTIVITAMIN ADULTS) TABS [id number] Yes Take 1 tablet by mouth daily. [provider] Taking Active Self           Med Note Geanie Logan, [facility] M   Wed Mar 09, 2022  9:30 PM) Pt  claims took half.  Pt has been throwing up.  omeprazole (PRILOSEC) 40 MG capsule [id number]  Take 1 capsule (40 mg total) by mouth in the morning and at bedtime. Melanie Shipper, MD  Active   potassium chloride SA (KLOR-CON M20) 20 MEQ tablet [id number] No Take 1 tablet (20 mEq total) by mouth daily.  Patient not taking: Reported on 07/26/2022   Kerin Perna, NP Not Taking Active   ursodiol (ACTIGALL) 300 MG capsule [id number] No Take by mouth.  Patient not taking: Reported on 06/09/2022   [provider] Not Taking Active Self            Patient Active Problem List   Diagnosis Date Noted   Hypokalemia 07/06/2022   Anxious mood 07/06/2022   Gonorrhea 11/09/2021   Ovarian cyst 11/09/2021   Bacterial vaginosis 05/13/2020   BMI 45.0-49.9, adult 06/06/2016   Psychosocial stressors 02/25/2016   Ptyalism 02/25/2016   Rubella non-immune status, antepartum 01/27/2016    Conditions to be addressed/monitored per PCP order:   community resources  There are no care plans that you recently modified to display for this patient.   Follow up:  Patient agrees to Care Plan and Follow-up.   Plan: The Managed Medicaid care management team will reach out to the patient again over the next 30 days.  Date/time of next scheduled Social Work care management/care coordination outreach:  09/14/22  Mickel Fuchs, Arita Miss, [facility] Social Worker [phone]

## 2022-09-02 DIAGNOSIS — F29 Unspecified psychosis not due to a substance or known physiological condition: Secondary | ICD-10-CM | POA: Diagnosis not present

## 2022-09-05 ENCOUNTER — Other Ambulatory Visit: Payer: Self-pay

## 2022-09-05 ENCOUNTER — Encounter (HOSPITAL_COMMUNITY): Payer: Self-pay

## 2022-09-05 ENCOUNTER — Emergency Department (HOSPITAL_COMMUNITY)
Admission: EM | Admit: 2022-09-05 | Discharge: 2022-09-05 | Disposition: A | Discharge status: Home / Self Care | Payer: Medicaid Other | Attending: Emergency Medicine | Admitting: Emergency Medicine

## 2022-09-05 DIAGNOSIS — R42 Dizziness and giddiness: Secondary | ICD-10-CM | POA: Diagnosis not present

## 2022-09-05 DIAGNOSIS — R55 Syncope and collapse: Secondary | ICD-10-CM | POA: Diagnosis not present

## 2022-09-05 LAB — CBC WITH DIFFERENTIAL/PLATELET
Abs Immature Granulocytes: 0.02 10*3/uL (ref 0.00–0.07)
Basophils Absolute: 0.1 10*3/uL (ref 0.0–0.1)
Basophils Relative: 1 %
Eosinophils Absolute: 0.1 10*3/uL (ref 0.0–0.5)
Eosinophils Relative: 2 %
HCT: 40 % (ref 36.0–46.0)
Hemoglobin: 13.2 g/dL (ref 12.0–15.0)
Immature Granulocytes: 0 %
Lymphocytes Relative: 29 %
Lymphs Abs: 1.7 10*3/uL (ref 0.7–4.0)
MCH: 28.4 pg (ref 26.0–34.0)
MCHC: 33 g/dL (ref 30.0–36.0)
MCV: 86 fL (ref 80.0–100.0)
Monocytes Absolute: 0.6 10*3/uL (ref 0.1–1.0)
Monocytes Relative: 10 %
Neutro Abs: 3.4 10*3/uL (ref 1.7–7.7)
Neutrophils Relative %: 58 %
Platelets: 289 10*3/uL (ref 150–400)
RBC: 4.65 MIL/uL (ref 3.87–5.11)
RDW: 12.8 % (ref 11.5–15.5)
WBC: 5.8 10*3/uL (ref 4.0–10.5)
nRBC: 0 % (ref 0.0–0.2)

## 2022-09-05 LAB — BASIC METABOLIC PANEL
Anion gap: 8 (ref 5–15)
BUN: 7 mg/dL (ref 6–20)
CO2: 24 mmol/L (ref 22–32)
Calcium: 8.8 mg/dL — ABNORMAL LOW (ref 8.9–10.3)
Chloride: 106 mmol/L (ref 98–111)
Creatinine, Ser: 0.65 mg/dL (ref 0.44–1.00)
GFR, Estimated: >60 mL/min (ref >60)
Glucose, Bld: 84 mg/dL (ref 70–99)
Potassium: 3.5 mmol/L (ref 3.5–5.1)
Sodium: 138 mmol/L (ref 135–145)

## 2022-09-05 LAB — I-STAT BETA HCG BLOOD, ED (MC, WL, AP ONLY): I-stat hCG, quantitative: <5 m[IU]/mL (ref <5)

## 2022-09-05 MED ORDER — SODIUM CHLORIDE 0.9 % IV BOLUS: 500.0000 mL | Freq: Once | INTRAVENOUS | Status: DC

## 2022-09-05 MED ORDER — SODIUM CHLORIDE 0.9 % IV BOLUS
1000.0000 mL | Freq: Once | INTRAVENOUS | Status: AC
  Administered 2022-09-05: 1000 mL via INTRAVENOUS

## 2022-09-05 NOTE — Discharge Instructions (Signed)
Return for any problem.  ?

## 2022-09-05 NOTE — ED Triage Notes (Addendum)
Pt coming from home via EMS with c/o near syncopal episode this morning. Patient states that she felt weak and dizzy so she laid down and then had an uncontrolled loose bowel movement. Patient reports no N/V.  500cc NS with EMS.

## 2022-09-05 NOTE — ED Provider Notes (Signed)
[place] EMERGENCY DEPARTMENT AT [facility] Provider Note   CSN: [id number] Arrival date & time: 09/05/22  4098     History  Chief Complaint  Patient presents with   Near Syncope    Melanie Cisneros is a 32 y.o. female.  32 year old female with prior medical history as detailed below presents for evaluation.  Patient reports that she has had decreased p.o. intake over the weekend.  Patient reports near syncopal episode this morning.  Patient reports that she was getting up for the day she stood up and then felt weak and dizzy.  She did lay down after this.  She did not pass out.  She denies associated nausea or vomiting.  She denies abdominal pain.  Denies fevers.    The history is provided by the patient and medical records.       Home Medications Prior to Admission medications   Medication Sig Start Date End Date Taking? Authorizing Provider  acetaminophen (TYLENOL) 325 MG tablet Take 2 tablets (650 mg total) by mouth every 6 (six) hours as needed for moderate pain. Patient not taking: Reported on 07/26/2022 03/30/22   Wallis Bamberg, PA-C  benzonatate (TESSALON PERLES) 100 MG capsule Take 1 capsule (100 mg total) by mouth every 6 (six) hours as needed for cough. Patient not taking: Reported on 07/26/2022 06/02/22 06/02/23  Nooruddin, Jason Fila, MD  metoprolol tartrate (LOPRESSOR) 25 MG tablet Take by mouth. 02/16/22   [provider]  Multiple Vitamins-Minerals (MULTIVITAMIN ADULTS) TABS Take 1 tablet by mouth daily.    [provider]  omeprazole (PRILOSEC) 40 MG capsule Take 1 capsule (40 mg total) by mouth in the morning and at bedtime. 07/26/22   Hilarie Fredrickson, MD  potassium chloride SA (KLOR-CON M20) 20 MEQ tablet Take 1 tablet (20 mEq total) by mouth daily. Patient not taking: Reported on 07/26/2022 07/04/22 08/03/22  Grayce Sessions, NP  ursodiol (ACTIGALL) 300 MG capsule Take by mouth. Patient not taking: Reported on 06/09/2022 01/19/22   [provider]      Allergies    Amoxicillin, Penicillins, and Pork allergy    Review of Systems   Review of Systems  All other systems reviewed and are negative.   Physical Exam Updated Vital Signs BP 113/79   Pulse 71   Temp (!) 97.4 F (36.3 C) (Oral)   Resp 17   LMP 08/15/2022   SpO2 100%  Physical Exam Vitals and nursing note reviewed.  Constitutional:      General: She is not in acute distress.    Appearance: Normal appearance. She is well-developed.  HENT:     Head: Normocephalic and atraumatic.  Eyes:     Conjunctiva/sclera: Conjunctivae normal.     Pupils: Pupils are equal, round, and reactive to light.  Cardiovascular:     Rate and Rhythm: Normal rate and regular rhythm.     Heart sounds: Normal heart sounds.  Pulmonary:     Effort: Pulmonary effort is normal. No respiratory distress.     Breath sounds: Normal breath sounds.  Abdominal:     General: There is no distension.     Palpations: Abdomen is soft.     Tenderness: There is no abdominal tenderness.  Musculoskeletal:        General: No deformity. Normal range of motion.     Cervical back: Normal range of motion and neck supple.  Skin:    General: Skin is warm and dry.  Neurological:  General: No focal deficit present.     Mental Status: She is alert and oriented to person, place, and time.     ED Results / Procedures / Treatments   Labs (all labs ordered are listed, but only abnormal results are displayed) Labs Reviewed  BASIC METABOLIC PANEL - Abnormal; Notable for the following components:      Result Value   Calcium 8.8 (*)    All other components within normal limits  CBC WITH DIFFERENTIAL/PLATELET  I-STAT BETA HCG BLOOD, ED ([facility], [facility], [facility] ONLY)    EKG EKG Interpretation  Date/Time:  Monday September 05 2022 07:18:33 EDT Ventricular Rate:  72 PR Interval:  155 QRS Duration: 79 QT Interval:  411 QTC Calculation: 450 R Axis:   42 Text Interpretation: Sinus rhythm Borderline T  abnormalities, anterior leads Confirmed by Kristine Royal [phone]) on 09/05/2022 7:22:21 AM  Radiology No results found.  Procedures Procedures    Medications Ordered in ED Medications  sodium chloride 0.9 % bolus 1,000 mL (0 mLs Intravenous Stopped 09/05/22 0900)    ED Course/ Medical Decision Making/ A&P                             Medical Decision Making Amount and/or Complexity of Data Reviewed Labs: ordered.    Medical Screen Complete  This patient presented to the ED with complaint of near syncope, dizziness.  This complaint involves an extensive number of treatment options. The initial differential diagnosis includes, but is not limited to, metabolic abnormality, mild dehydration, etc.  This presentation is: Acute, Chronic, Self-Limited, Previously Undiagnosed, Uncertain Prognosis, and Complicated  Patient presents with complaint of near syncopal episode.  Described symptoms are not consistent with significant pathology such as arrhythmia, ACS, etc.   Screening labs obtained are without significant abnormality.  Patient feels significantly improved after IV fluids.  Patient offered additional IV fluids and/or observation here in the ED.  She declined same.  She reports that she feels improved.  She is reporting that she has a follow-up appointment already scheduled with her PCP on this Friday.  She plans on keeping that appointment.  Importance of close follow-up is stressed.  Strict return precautions given and understood.  Additional history obtained:  External records from outside sources obtained and reviewed including prior ED visits and prior Inpatient records.    Lab Tests:  I ordered and personally interpreted labs.  The pertinent results include: CBC, BMP, hCG   Cardiac Monitoring:  The patient was maintained on a cardiac monitor.  I personally viewed and interpreted the cardiac monitor which showed an underlying rhythm of: NSR   Medicines  ordered:  I ordered medication including  IV fluids for suspected mild dehydration Reevaluation of the patient after these medicines showed that the patient: improved  Problem List / ED Course:  Near syncope   Reevaluation:  After the interventions noted above, I reevaluated the patient and found that they have: improved  Disposition:  After consideration of the diagnostic results and the patients response to treatment, I feel that the patent would benefit from close outpatient follow-up.          Final Clinical Impression(s) / ED Diagnoses Final diagnoses:  Near syncope    Rx / DC Orders ED Discharge Orders     None         Wynetta Fines, MD 09/05/22 1022

## 2022-09-06 ENCOUNTER — Telehealth: Payer: Self-pay | Admitting: *Deleted

## 2022-09-06 DIAGNOSIS — Z9884 Bariatric surgery status: Secondary | ICD-10-CM | POA: Diagnosis not present

## 2022-09-06 DIAGNOSIS — Z903 Acquired absence of stomach [part of]: Secondary | ICD-10-CM | POA: Diagnosis not present

## 2022-09-06 NOTE — Transitions of Care (Post Inpatient/ED Visit) (Signed)
   09/06/2022  Name: Melanie Cisneros MRN: [id number] DOB: [birth year]  Today's TOC FU Call Status: Today's TOC FU Call Status:: Unsuccessul Call (1st Attempt) Unsuccessful Call (1st Attempt) Date: 09/06/22  Attempted to reach the patient regarding the most recent Inpatient/ED visit.  Follow Up Plan: Additional outreach attempts will be made to reach the patient to complete the Transitions of Care (Post Inpatient/ED visit) call.   Estanislado Emms RN, BSN [place]  Managed [facility] RN Care Coordinator [phone]

## 2022-09-07 ENCOUNTER — Telehealth: Payer: Self-pay | Admitting: *Deleted

## 2022-09-07 NOTE — Transitions of Care (Post Inpatient/ED Visit) (Signed)
   09/07/2022  Name: Melanie Cisneros MRN: [id number] DOB: 07/04/1990  Today's TOC FU Call Status: Today's TOC FU Call Status:: Unsuccessful Call (2nd Attempt) Unsuccessful Call (2nd Attempt) Date: 09/07/22  Attempted to reach the patient regarding the most recent Inpatient/ED visit.  Follow Up Plan: Additional outreach attempts will be made to reach the patient to complete the Transitions of Care (Post Inpatient/ED visit) call.   Estanislado Emms RN, BSN [place]  Managed [facility] RN Care Coordinator [phone]

## 2022-09-12 ENCOUNTER — Ambulatory Visit: Payer: Medicaid Other

## 2022-09-12 ENCOUNTER — Ambulatory Visit: Payer: Medicaid Other | Admitting: Student

## 2022-09-13 ENCOUNTER — Encounter: Payer: Self-pay | Admitting: Student

## 2022-09-13 ENCOUNTER — Ambulatory Visit: Payer: Medicaid Other | Admitting: Student

## 2022-09-13 VITALS — BP 98/60 | HR 85 | Wt 182.2 lb

## 2022-09-13 DIAGNOSIS — F419 Anxiety disorder, unspecified: Secondary | ICD-10-CM | POA: Diagnosis not present

## 2022-09-13 DIAGNOSIS — R300 Dysuria: Secondary | ICD-10-CM | POA: Diagnosis not present

## 2022-09-13 DIAGNOSIS — Z9884 Bariatric surgery status: Secondary | ICD-10-CM | POA: Diagnosis not present

## 2022-09-13 DIAGNOSIS — Z903 Acquired absence of stomach [part of]: Secondary | ICD-10-CM | POA: Diagnosis not present

## 2022-09-13 DIAGNOSIS — R42 Dizziness and giddiness: Secondary | ICD-10-CM | POA: Diagnosis not present

## 2022-09-13 DIAGNOSIS — N898 Other specified noninflammatory disorders of vagina: Secondary | ICD-10-CM

## 2022-09-13 LAB — POCT URINALYSIS DIP (MANUAL ENTRY)
Bilirubin, UA: NEGATIVE
Blood, UA: NEGATIVE
Glucose, UA: NEGATIVE mg/dL
Ketones, POC UA: NEGATIVE mg/dL
Leukocytes, UA: NEGATIVE
Nitrite, UA: NEGATIVE
Protein Ur, POC: NEGATIVE mg/dL
Spec Grav, UA: 1.02 (ref 1.010–1.025)
Urobilinogen, UA: 0.2 E.U./dL
pH, UA: 5.5 (ref 5.0–8.0)

## 2022-09-13 MED ORDER — FLUCONAZOLE 150 MG PO TABS
150.0000 mg | ORAL_TABLET | Freq: Once | ORAL | 0 refills | Status: AC
Start: 2022-09-13 — End: 2022-09-13

## 2022-09-13 NOTE — Patient Instructions (Addendum)
It was great to see you! Thank you for allowing me to participate in your care!   Our plans for today:  - Your urine showed no signs of infection - I will give you a pill for yeast infection - Please get labs from bariatrics  Take care and seek immediate care sooner if you develop any concerns.  Levin Erp, MD

## 2022-09-13 NOTE — Assessment & Plan Note (Signed)
No significant drop in BP with orthostatic vitals. No known LOC episodes. Discussed symptomatic measures of fluid intake and waiting before standing up. I highly suspect anxiety and social situation is playing into her condition as it aligns with her symptoms starting. 20/20 on vision screen-unlikely to be contributing to current condition but discussed going to optometry for vision exam  - A1C, Lipid Panel, CBC, CMP, Ferritin, Iron panel, Vitamin B1, Vitamin B12, Vitamin D, Folic Acid ordered by bariatric provider - Start iron tablet that was prescribed - Stay well hydrated - Would benefit with close and frequent follow up to discuss this further

## 2022-09-13 NOTE — Assessment & Plan Note (Signed)
Not enough time to delve into this. She needs close follow up and further workup of this. No SI. Discussed that physical symptoms can be manifestations of this and she agreed.

## 2022-09-13 NOTE — Progress Notes (Signed)
SUBJECTIVE:   CHIEF COMPLAINT / HPI: Frequent urination, near syncope, high stress  Near syncope/Lightheadedness Patient says for the last 1.5 months has been having issue where if she gets up too quickly she starts to get lightheaded and feels like she needs to pass out. She went to ED on 4/22 for this and was given 1 L NS and felt much better. Remained in NSR on cardiac monitoring. She wonders if this is related to her bariatric surgery. Her last bariatric surgery she got was in October but her symptoms of lightheadedness did not start until march I ask if anything new has happened in March and she says she recently got another job.  She is extremely overwhelmed with her time shifts sometimes working the whole day She is also taking care of her 24 year old daughter  Overall has a lot on her plate She is on low dose of metoprolol 25 mg  She went to her Bariatric surgeon on 4/23 and they also mention the dehydration episodes and stress with working two jobs. They added iron supplementation, increased fluids, placed RD referral and ordered multitude of labs which she is getting today.  Sometimes has blurry vision, has not seen eye doctor in a while  Frequent urination Since increasing fluids after going to ED and bariatric surgeon she has been having increased urination and urge. Wonders if this is a UTI. Sometimes has back pain on right. No vomiting Dysuria- No Frequency- Yes  Urgency- Yes  Hematuria- No Flank pain- no but some right sided back pain Suprapubic pain- No Fevers- No   Anxious mood Multiple stressors, discussed that this could be manifesting in her physical symptoms as well after we rule out medical processes.She agrees with this assessment. Did not get to delve into this further and recommended PCP follow up to discuss further.     09/13/2022   11:14 AM 02/28/2022    4:10 PM 11/09/2021    2:47 PM 08/05/2021    1:51 PM 08/17/2020    2:32 PM  Depression screen PHQ 2/9   Decreased Interest 0 0 0 0 0  Down, Depressed, Hopeless 0 0 0 0 0  PHQ - 2 Score 0 0 0 0 0  Altered sleeping 0 0 0  0  Tired, decreased energy 0 0 0  0  Change in appetite 0 0 0  0  Feeling bad or failure about yourself  0 0 0  0  Trouble concentrating 0 0 0  0  Moving slowly or fidgety/restless 0 0 0  0  Suicidal thoughts 0 0 0  0  PHQ-9 Score 0 0 0  0  Difficult doing work/chores Not difficult at all Not difficult at all Not difficult at all      PERTINENT  PMH / PSH:   OBJECTIVE:   BP 98/60 Comment: Right after standing  Pulse 85   Wt 182 lb 3.2 oz (82.6 kg)   LMP 08/15/2022   SpO2 98%   BMI 30.32 kg/m   General: Well appearing, NAD, awake, alert, responsive to questions Head: Normocephalic atraumatic CV: Regular rate and rhythm no murmurs rubs or gallops Respiratory: Clear to ausculation bilaterally, no wheezes rales or crackles, chest rises symmetrically,  no increased work of breathing Abdomen: Soft, non-tender, non-distended, normoactive bowel sounds, no CVA tenderness Extremities: Moves upper and lower extremities freely, no edema in LE Neuro: No focal deficits Skin: No rashes or lesions visualized   Orthostatic BP: 102/62 laying to  98/60 standing Vision screening: 20/20 bilaterally  ASSESSMENT/PLAN:   Orthostatic lightheadedness No significant drop in BP with orthostatic vitals. No known LOC episodes. Discussed symptomatic measures of fluid intake and waiting before standing up. I highly suspect anxiety and social situation is playing into her condition as it aligns with her symptoms starting. 20/20 on vision screen-unlikely to be contributing to current condition but discussed going to optometry for vision exam  - A1C, Lipid Panel, CBC, CMP, Ferritin, Iron panel, Vitamin B1, Vitamin B12, Vitamin D, Folic Acid ordered by bariatric provider - Start iron tablet that was prescribed - Stay well hydrated - Would benefit with close and frequent follow up to discuss  this further  Anxious mood Not enough time to delve into this. She needs close follow up and further workup of this. No SI. Discussed that physical symptoms can be manifestations of this and she agreed.   Frequent urination UA completely clear. Likely secondary to increasing fluid intake. She will get A1c on labs ordered by bariatric surgeon today as well. She says she has some vaginal itching at end of visit-wonders if this is yeast infection. Discussed I will prescribe diflucan and if not improved she will need to return for vaginal swab. - fluconazole (DIFLUCAN) 150 MG tablet; Take 1 tablet (150 mg total) by mouth once for 1 dose.  Dispense: 1 tablet; Refill: 0 -A1c ordered by bariatric provider    Levin Erp, MD [facility]

## 2022-09-14 ENCOUNTER — Other Ambulatory Visit: Payer: Medicaid Other

## 2022-09-14 NOTE — Patient Instructions (Signed)
Visit Information  [name] was given information about Medicaid Managed Care team care coordination services as a part of their Healthy [employer] Medicaid benefit. Melanie Cisneros verbally consented to engagement with the Medicaid Managed Care team.   If you are experiencing a medical emergency, please call 911 or report to your local emergency department or urgent care.   If you have a non-emergency medical problem during routine business hours, please contact your provider's office and ask to speak with a nurse.   For questions related to your Healthy [employer] Medicaid health plan, please call: [phone] or visit the homepage here: [email or web address]  If you would like to schedule transportation through your Healthy [employer] Medicaid plan, please call the following number at least 2 days in advance of your appointment: [phone]  For information about your ride after you set it up, call Ride Assist at [phone]. Use this number to activate a Will Call pickup, or if your transportation is late for a scheduled pickup. Use this number, too, if you need to make a change or cancel a previously scheduled reservation.  If you need transportation services right away, call [phone]. The after-hours call center is staffed 24 hours to handle ride assistance and urgent reservation requests (including discharges) 365 days a year. Urgent trips include sick visits, hospital discharge requests and life-sustaining treatment.  Call the Behavioral Health Crisis Line at [phone], at any time, 24 hours a day, 7 days a week. If you are in danger or need immediate medical attention call 911.  If you would like help to quit smoking, call 1-800-QUIT-NOW (1-800-784-8669) OR Espaol: 1-855-Djelo-Ya (1-855-335-3569) o para ms informacin haga clic aqu or Text READY to 200-400 to register via text  Melanie Cisneros - following are the goals we discussed in your visit today:   Goals  Addressed   None      Social Worker will follow up in 30 days.   Melanie Cisneros, BSW, MHA [place]  Managed Medicaid Social Worker [phone]   Following is a copy of your plan of care:  There are no care plans that you recently modified to display for this patient.    

## 2022-09-14 NOTE — Patient Outreach (Signed)
Medicaid Managed Care Social Work Note  09/14/2022 Name:  Melanie Cisneros MRN:  [id number] DOB:  [birth year]  Melanie Cisneros is an 32 y.o. year old female who is a primary patient of Darral Dash, DO.  The Medicaid Managed Care Coordination team was consulted for assistance with:  Community Resources   Melanie Cisneros was given information about Medicaid Managed Care Coordination team services today. Melanie Cisneros Patient agreed to services and verbal consent obtained.  Engaged with patient  for by telephone forfollow up visit in response to referral for case management and/or care coordination services.   Assessments/Interventions:  Review of past medical history, allergies, medications, health status, including review of consultants reports, laboratory and other test data, was performed as part of comprehensive evaluation and provision of chronic care management services.  SDOH: (Social Determinant of Health) assessments and interventions performed: SDOH Interventions    Flowsheet Row Office Visit from 11/09/2021 in [facility]  SDOH Interventions   Food Insecurity Interventions Stage manager ([facility] only)     BSW completed a telephone follow up with patient, she states she did receive the resources but none of them had funds. Patient states she forgot about Healthy Blue and would give them a call. No other resources are needed at this time.  Advanced Directives Status:  Not addressed in this encounter.  Care Plan                 Allergies  Allergen Reactions   Amoxicillin Anaphylaxis, Hives and Other (See Comments)    Has patient had a PCN reaction causing immediate rash, facial/tongue/throat swelling, SOB or lightheadedness with hypotension: Yes Has patient had a PCN reaction causing severe rash involving mucus membranes or skin necrosis: No Has patient had a PCN reaction that required hospitalization No Has patient  had a PCN reaction occurring within the last 10 years: No If all of the above answers are "NO", then may proceed with Cephalosporin use.   Penicillins Anaphylaxis, Hives and Other (See Comments)    Has patient had a PCN reaction causing immediate rash, facial/tongue/throat swelling, SOB or lightheadedness with hypotension: Yes Has patient had a PCN reaction causing severe rash involving mucus membranes or skin necrosis: No Has patient had a PCN reaction that required hospitalization No Has patient had a PCN reaction occurring within the last 10 years: No If all of the above answers are "NO", then may proceed with Cephalosporin use.   Pork Allergy Other (See Comments)    Pt has tolerated multiple doses of heparin    Medications Reviewed Today     Reviewed by Levin Erp, MD (Resident) on 09/13/22 at 2155  Med List Status: <None>   Medication Order Taking? Sig Documenting Provider Last Dose Status Informant  acetaminophen (TYLENOL) 325 MG tablet [id number] No Take 2 tablets (650 mg total) by mouth every 6 (six) hours as needed for moderate pain.  Patient not taking: Reported on 07/26/2022   Wallis Bamberg, PA-C Not Taking Active            Med Note (RATLIFF, THURSHELL   Tue Jul 26, 2022 10:26 AM) As needed  benzonatate (TESSALON PERLES) 100 MG capsule [id number] No Take 1 capsule (100 mg total) by mouth every 6 (six) hours as needed for cough.  Patient not taking: Reported on 07/26/2022   Nooruddin, Jason Fila, MD Not Taking Active   fluconazole (DIFLUCAN) 150 MG tablet [id number] Yes Take 1  tablet (150 mg total) by mouth once for 1 dose. Levin Erp, MD  Active   metoprolol tartrate (LOPRESSOR) 25 MG tablet [id number] No Take by mouth. [provider] Taking Active Self  Multiple Vitamins-Minerals (MULTIVITAMIN ADULTS) TABS [id number] No Take 1 tablet by mouth daily. [provider] Taking Active Self           Med Note Hal Hope, [facility] M   Wed Mar 09, 2022  9:30 PM) Pt claims  took half.  Pt has been throwing up.  omeprazole (PRILOSEC) 40 MG capsule [id number]  Take 1 capsule (40 mg total) by mouth in the morning and at bedtime. Hilarie Fredrickson, MD  Active   potassium chloride SA (KLOR-CON M20) 20 MEQ tablet [id number] No Take 1 tablet (20 mEq total) by mouth daily.  Patient not taking: Reported on 07/26/2022   Grayce Sessions, NP Not Taking Expired 08/03/22 2359   ursodiol (ACTIGALL) 300 MG capsule [id number] No Take by mouth.  Patient not taking: Reported on 06/09/2022   [provider] Not Taking Active Self            Patient Active Problem List   Diagnosis Date Noted   Orthostatic lightheadedness 09/13/2022   Hypokalemia 07/06/2022   Anxious mood 07/06/2022   Gonorrhea 11/09/2021   Ovarian cyst 11/09/2021   Bacterial vaginosis 05/13/2020   BMI 45.0-49.9, adult (HCC) 06/06/2016   Psychosocial stressors 02/25/2016   Ptyalism 02/25/2016   Rubella non-immune status, antepartum 01/27/2016    Conditions to be addressed/monitored per PCP order:   community resources  There are no care plans that you recently modified to display for this patient.   Follow up:  Patient agrees to Care Plan and Follow-up.  Plan: The Managed Medicaid care management team will reach out to the patient again over the next 30 days.  Date/time of next scheduled Social Work care management/care coordination outreach:  10/17/22  Gus Puma, Kenard Gower, [facility]  Managed [facility] Social Worker [phone]

## 2022-09-15 ENCOUNTER — Telehealth: Payer: Self-pay | Admitting: *Deleted

## 2022-09-15 DIAGNOSIS — H538 Other visual disturbances: Secondary | ICD-10-CM

## 2022-09-15 NOTE — Telephone Encounter (Signed)
Patient called back and states that she needs a referral to see [facility].  She called them and they will not be able to see her for an emergency visit until they receive a referral from [place] and the office note. Will forward to MD who saw patient.  Virgie Kunda,CMA

## 2022-09-15 NOTE — Telephone Encounter (Signed)
Referral faxed to Dr. Laruth Bouchard office.  Rosilyn Coachman,CMA

## 2022-10-17 ENCOUNTER — Ambulatory Visit: Payer: Medicaid Other | Admitting: Student

## 2022-10-17 ENCOUNTER — Other Ambulatory Visit: Payer: Medicaid Other

## 2022-10-17 NOTE — Patient Instructions (Signed)
Visit Information  Ms. Gahn was given information about Medicaid Managed Care team care coordination services as a part of their Healthy [facility] Medicaid benefit. [name] verbally consented to engagement with the [facility] Managed Care team.   If you are experiencing a medical emergency, please call 911 or report to your local emergency department or urgent care.   If you have a non-emergency medical problem during routine business hours, please contact your provider's office and ask to speak with a nurse.   For questions related to your Healthy [facility] health plan, please call: [phone] or visit the homepage here: [email or web address]  If you would like to schedule transportation through your Healthy [facility] plan, please call the following number at least 2 days in advance of your appointment: [phone]  For information about your ride after you set it up, call Ride Assist at [phone]. Use this number to activate a Will Call pickup, or if your transportation is late for a scheduled pickup. Use this number, too, if you need to make a change or cancel a previously scheduled reservation.  If you need transportation services right away, call [phone]. The after-hours call center is staffed 24 hours to handle ride assistance and urgent reservation requests (including discharges) 365 days a year. Urgent trips include sick visits, hospital discharge requests and life-sustaining treatment.  Call the [facility] Line at [phone], at any time, 24 hours a day, 7 days a week. If you are in danger or need immediate medical attention call 911.  If you would like help to quit smoking, call 1-800-QUIT-NOW ((850)525-1936) OR Espaol: 1-855-Djelo-Ya (3-875-643-3295) o para ms informacin haga clic aqu or Text READY to 188-416 to register via text  Ms. Marina Goodell - following are the goals we discussed in your visit today:   Goals  Addressed   None      The  Patient                                              has been provided with contact information for the Managed Medicaid care management team and has been advised to call with any health related questions or concerns.   Gus Puma, Kenard Gower, MHA [facility]  Managed Medicaid Social Worker [phone]   Following is a copy of your plan of care:  There are no care plans that you recently modified to display for this patient.

## 2022-10-17 NOTE — Patient Outreach (Signed)
Medicaid Managed Care Social Work Note  10/17/2022 Name:  Melanie Cisneros MRN:  [id number] DOB:  1990-06-23  Melanie Cisneros is an 32 y.o. year old female who is a primary patient of Darral Dash, DO.  The Medicaid Managed Care Coordination team was consulted for assistance with:  Community Resources   [name] was given information about Medicaid Managed Care Coordination team services today. [name] Patient agreed to services and verbal consent obtained.  Engaged with patient  for by telephone forfollow up visit in response to referral for case management and/or care coordination services.   Assessments/Interventions:  Review of past medical history, allergies, medications, health status, including review of consultants reports, laboratory and other test data, was performed as part of comprehensive evaluation and provision of chronic care management services.  SDOH: (Social Determinant of Health) assessments and interventions performed: SDOH Interventions    Flowsheet Row Office Visit from 11/09/2021 in [facility]  SDOH Interventions   Food Insecurity Interventions Stage manager ([facility] only)     BSW completed a telephone outreach with patient, she is still needing assistance with utilities and rent, patient stated she would be giving healthy blue a call today. No other resources are needed at this time.   Advanced Directives Status:  Not addressed in this encounter.  Care Plan                 Allergies  Allergen Reactions   Amoxicillin Anaphylaxis, Hives and Other (See Comments)    Has patient had a PCN reaction causing immediate rash, facial/tongue/throat swelling, SOB or lightheadedness with hypotension: Yes Has patient had a PCN reaction causing severe rash involving mucus membranes or skin necrosis: No Has patient had a PCN reaction that required hospitalization No Has patient had a PCN reaction  occurring within the last 10 years: No If all of the above answers are "NO", then may proceed with Cephalosporin use.   Penicillins Anaphylaxis, Hives and Other (See Comments)    Has patient had a PCN reaction causing immediate rash, facial/tongue/throat swelling, SOB or lightheadedness with hypotension: Yes Has patient had a PCN reaction causing severe rash involving mucus membranes or skin necrosis: No Has patient had a PCN reaction that required hospitalization No Has patient had a PCN reaction occurring within the last 10 years: No If all of the above answers are "NO", then may proceed with Cephalosporin use.   Pork Allergy Other (See Comments)    Pt has tolerated multiple doses of heparin    Medications Reviewed Today     Reviewed by Levin Erp, MD (Resident) on 09/13/22 at 2155  Med List Status: <None>   Medication Order Taking? Sig Documenting Provider Last Dose Status Informant  acetaminophen (TYLENOL) 325 MG tablet [id number] No Take 2 tablets (650 mg total) by mouth every 6 (six) hours as needed for moderate pain.  Patient not taking: Reported on 07/26/2022   Wallis Bamberg, PA-C Not Taking Active            Med Note (RATLIFF, THURSHELL   Tue Jul 26, 2022 10:26 AM) As needed  benzonatate (TESSALON PERLES) 100 MG capsule [id number] No Take 1 capsule (100 mg total) by mouth every 6 (six) hours as needed for cough.  Patient not taking: Reported on 07/26/2022   Nooruddin, Jason Fila, MD Not Taking Active   fluconazole (DIFLUCAN) 150 MG tablet [id number] Yes Take 1 tablet (150 mg total) by mouth  once for 1 dose. Levin Erp, MD  Active   metoprolol tartrate (LOPRESSOR) 25 MG tablet [id number] No Take by mouth. [provider] Taking Active Self  Multiple Vitamins-Minerals (MULTIVITAMIN ADULTS) TABS [id number] No Take 1 tablet by mouth daily. [provider] Taking Active Self           Med Note Hal Hope, [facility] M   Wed Mar 09, 2022  9:30 PM) Pt claims took half.  Pt has  been throwing up.  omeprazole (PRILOSEC) 40 MG capsule [id number]  Take 1 capsule (40 mg total) by mouth in the morning and at bedtime. Hilarie Fredrickson, MD  Active   potassium chloride SA (KLOR-CON M20) 20 MEQ tablet [id number] No Take 1 tablet (20 mEq total) by mouth daily.  Patient not taking: Reported on 07/26/2022   Grayce Sessions, NP Not Taking Expired 08/03/22 2359   ursodiol (ACTIGALL) 300 MG capsule [id number] No Take by mouth.  Patient not taking: Reported on 06/09/2022   [provider] Not Taking Active Self            Patient Active Problem List   Diagnosis Date Noted   Orthostatic lightheadedness 09/13/2022   Hypokalemia 07/06/2022   Anxious mood 07/06/2022   Gonorrhea 11/09/2021   Ovarian cyst 11/09/2021   Bacterial vaginosis 05/13/2020   BMI 45.0-49.9, adult (HCC) 06/06/2016   Psychosocial stressors 02/25/2016   Ptyalism 02/25/2016   Rubella non-immune status, antepartum 01/27/2016    Conditions to be addressed/monitored per PCP order:   community resources  There are no care plans that you recently modified to display for this patient.   Follow up:  Patient agrees to Care Plan and Follow-up.  Plan: The  Patient has been provided with contact information for the Managed Medicaid care management team and has been advised to call with any health related questions or concerns.   Abelino Derrick, MHA [facility]  Managed [facility] Social Worker [phone]

## 2022-10-31 ENCOUNTER — Ambulatory Visit: Payer: Medicaid Other | Admitting: Student

## 2022-11-30 ENCOUNTER — Encounter: Payer: Medicaid Other | Admitting: Family

## 2022-11-30 NOTE — Progress Notes (Signed)
  This encounter was created in error - please disregard. No show 

## 2022-12-04 IMAGING — US US PELVIS COMPLETE TRANSABD/TRANSVAG W DUPLEX AND/OR DOPPLER
1 series · 13 of 25 positions shown · non-contrast
Comparison: Pelvic ultrasound and abdominal CT 11/01/2021

CLINICAL DATA: Pelvic pain

EXAM:
TRANSABDOMINAL AND TRANSVAGINAL ULTRASOUND OF PELVIS
DOPPLER ULTRASOUND OF OVARIES
TECHNIQUE: Both transabdominal and transvaginal ultrasound examinations of the
pelvis were performed. Transabdominal technique was performed for
global imaging of the pelvis including uterus, ovaries, adnexal
regions, and pelvic cul-de-sac.
It was necessary to proceed with endovaginal exam following the
transabdominal exam to visualize the uterus endometrium ovaries.
Color and duplex Doppler ultrasound was utilized to evaluate blood
flow to the ovaries.

[Series 1: us pelvic complete w transvaginal and torsion righ · 139 acquisitions, 13 frames shown]
[im 1/139]
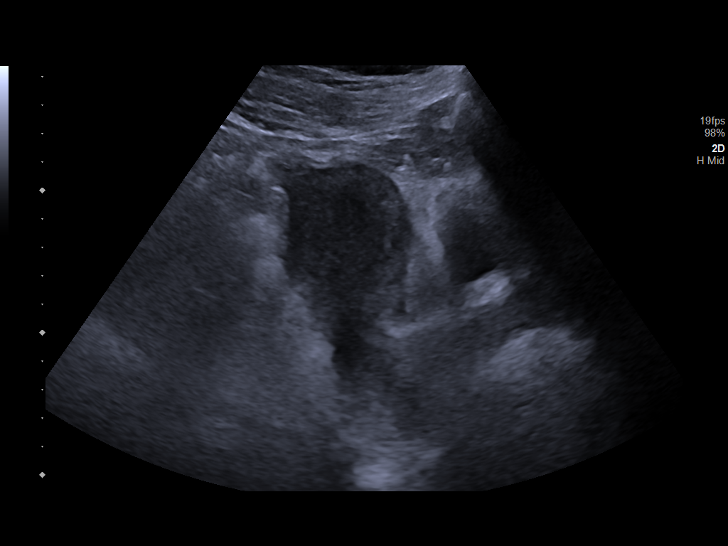
[im 12/139]
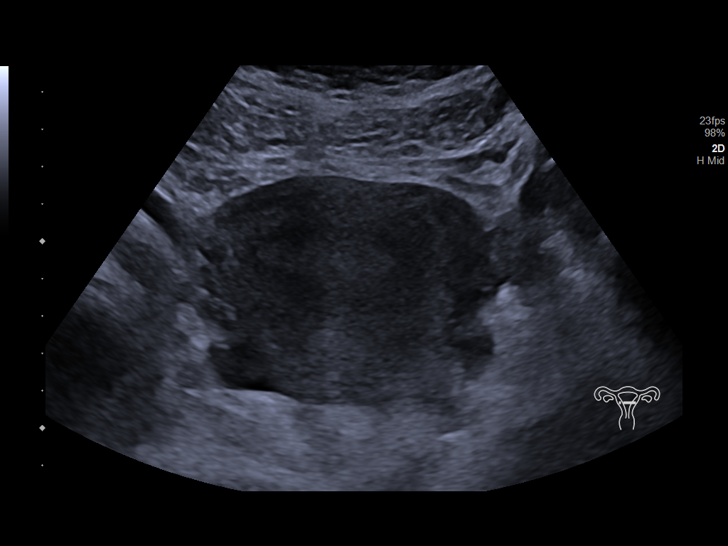
[im 24/139]
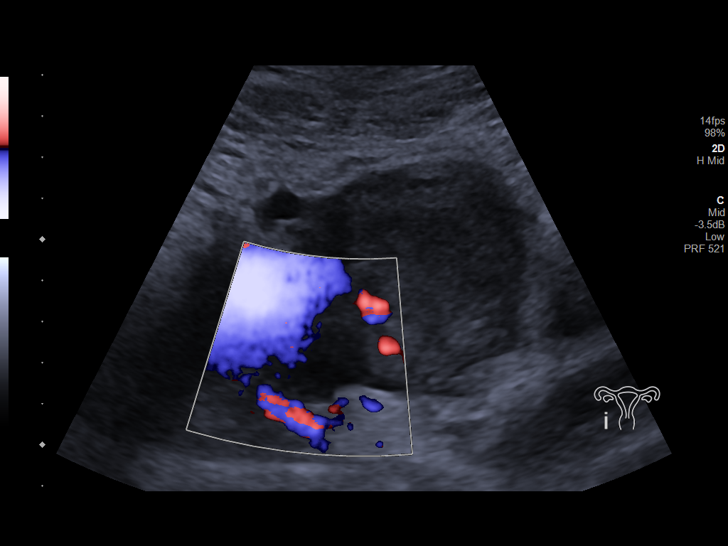
[im 35/139]
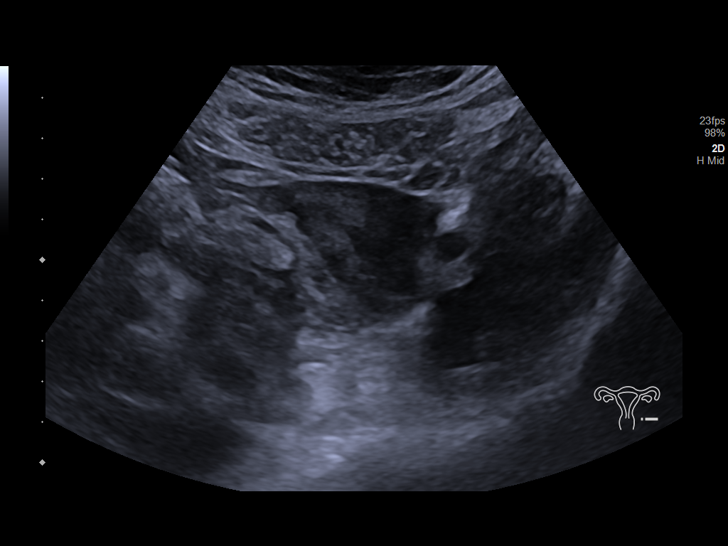
[im 47/139]
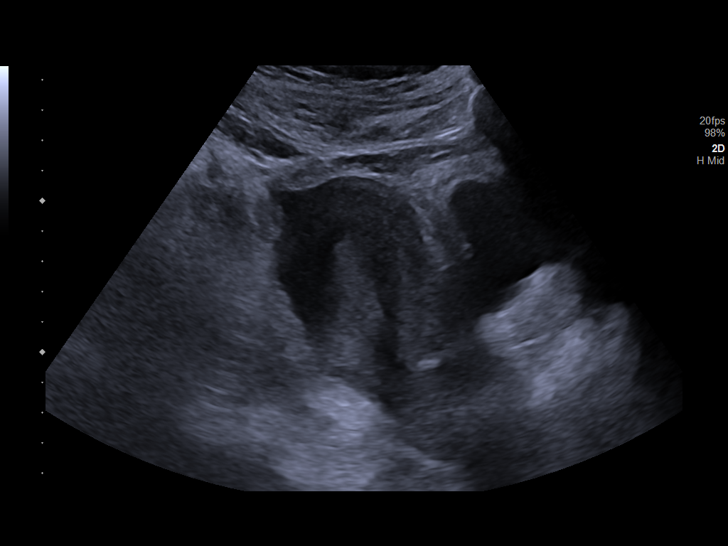
[im 58/139]
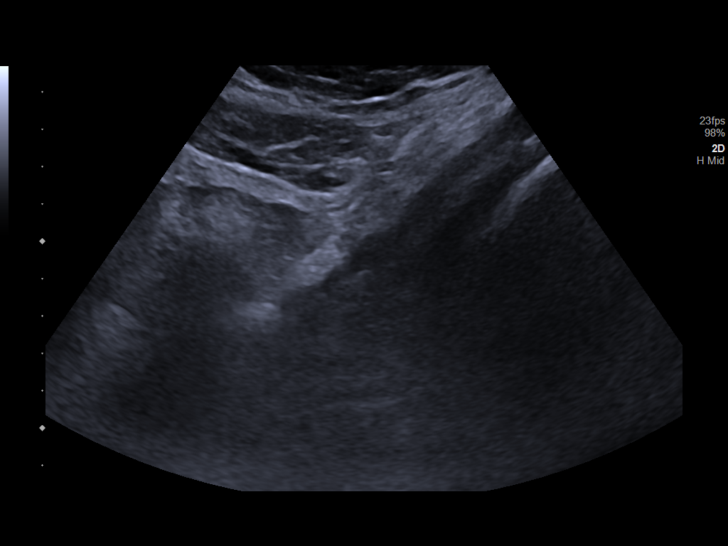
[im 70/139]
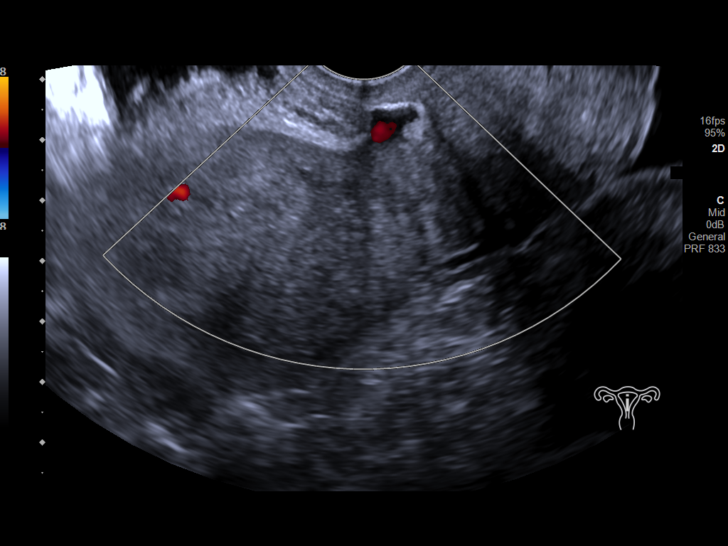
[im 81/139]
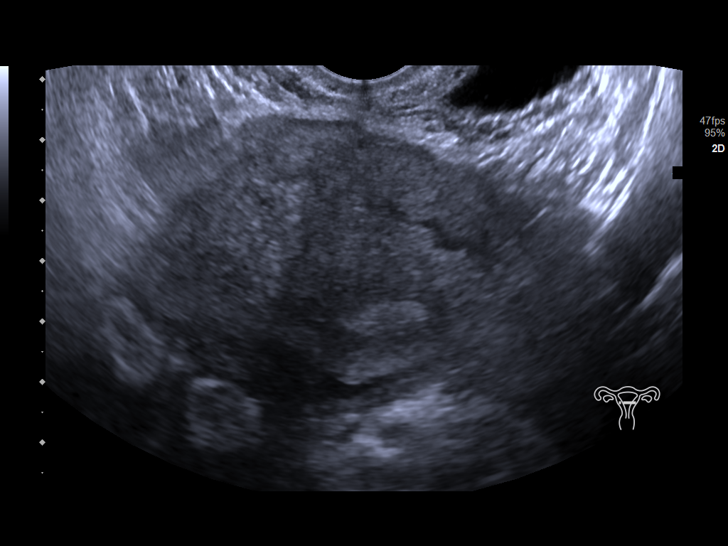
[im 93/139]
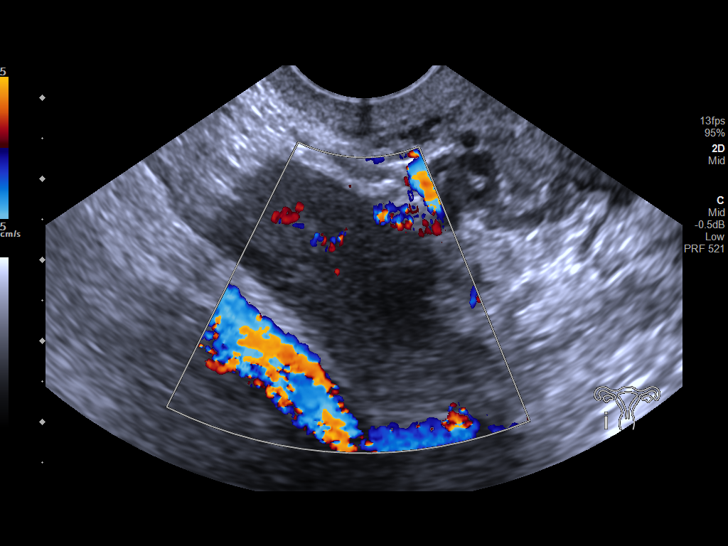
[im 104/139]
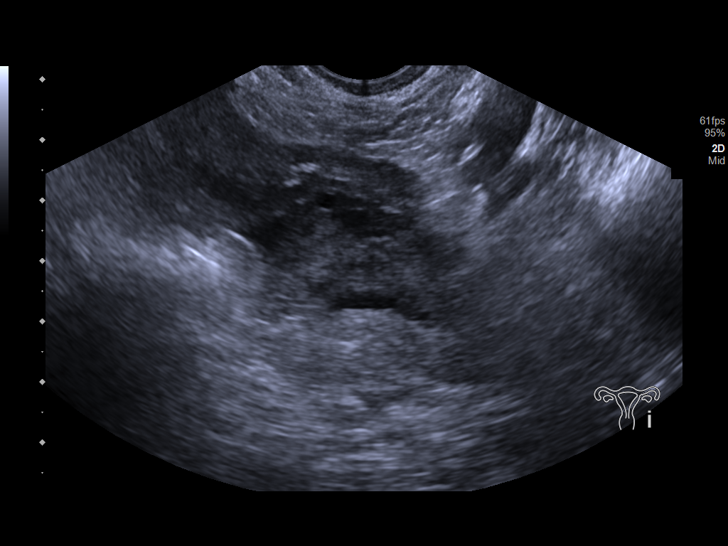
[im 116/139]
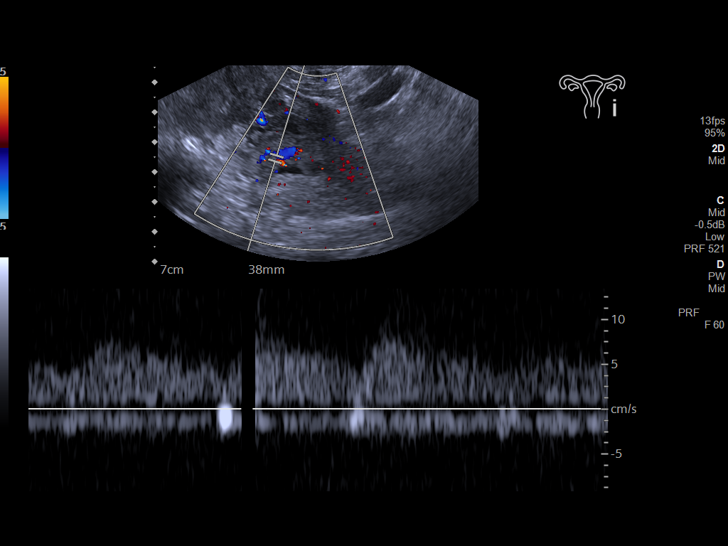
[im 127/139]
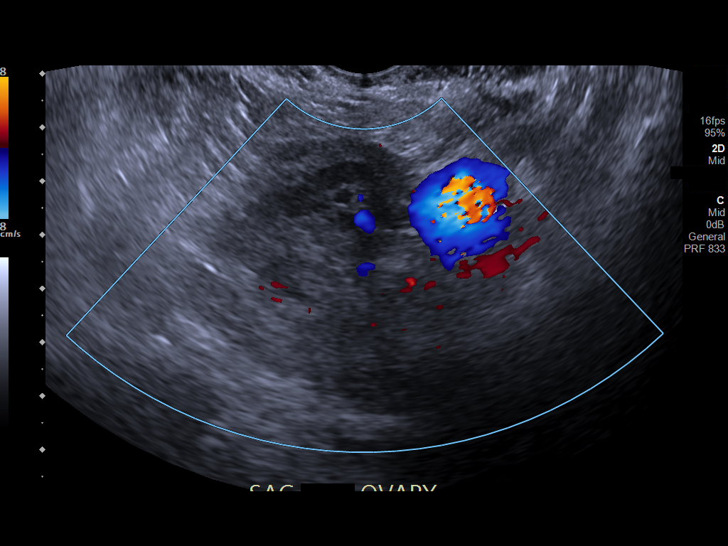
[im 139/139]
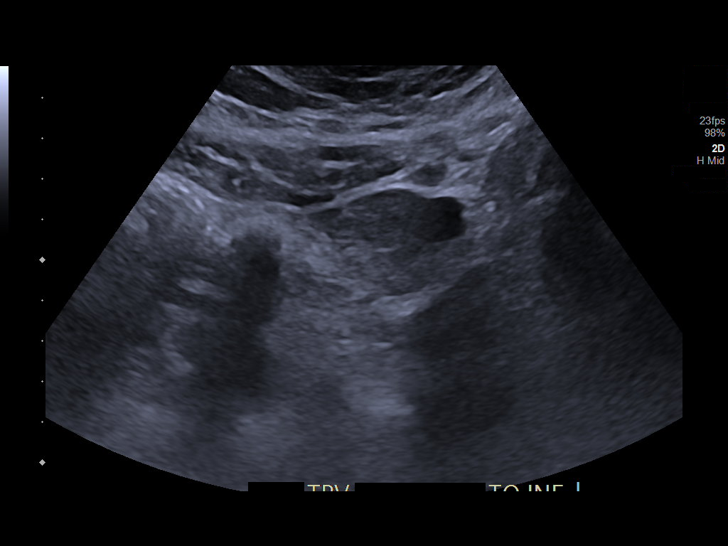

[13 of 25 positions shown; findings below may reference images not displayed]

FINDINGS: Uterus

Measurements: 10.3 x 5.4 x 7.3 cm = volume: 211.9 mL. No fibroids or
other mass visualized.

Endometrium

Thickness: 11 mm.  No focal abnormality visualized.

Right ovary

Measurements: 3.2 x 2.2 x 2.3 cm = volume: 8.2 mL. Normal
appearance/no adnexal mass.

Left ovary

Measurements: 3.7 x 3 x 2.7 cm = volume: 15.7 mL. Normal
appearance/no adnexal mass. Previously noted left ovarian cyst is
not visualized today.

Pulsed Doppler evaluation of both ovaries demonstrates normal
low-resistance arterial and venous waveforms.

Other findings

Small free fluid
IMPRESSION: 1. Negative for ovarian torsion. The previously noted left ovarian
cyst is not visualized on today's exam
2. Trace free fluid in the pelvis

## 2022-12-09 ENCOUNTER — Encounter (HOSPITAL_COMMUNITY): Payer: Self-pay

## 2022-12-09 ENCOUNTER — Telehealth (HOSPITAL_COMMUNITY): Payer: Self-pay

## 2022-12-09 ENCOUNTER — Ambulatory Visit (HOSPITAL_COMMUNITY)
Admission: EM | Admit: 2022-12-09 | Discharge: 2022-12-09 | Disposition: A | Discharge status: Home / Self Care | Payer: Medicaid Other | Source: Home / Self Care

## 2022-12-09 DIAGNOSIS — Z113 Encounter for screening for infections with a predominantly sexual mode of transmission: Secondary | ICD-10-CM | POA: Diagnosis not present

## 2022-12-09 DIAGNOSIS — R6889 Other general symptoms and signs: Secondary | ICD-10-CM | POA: Diagnosis not present

## 2022-12-09 DIAGNOSIS — R002 Palpitations: Secondary | ICD-10-CM | POA: Diagnosis not present

## 2022-12-09 DIAGNOSIS — R200 Anesthesia of skin: Secondary | ICD-10-CM | POA: Insufficient documentation

## 2022-12-09 DIAGNOSIS — Z9884 Bariatric surgery status: Secondary | ICD-10-CM | POA: Diagnosis not present

## 2022-12-09 DIAGNOSIS — R42 Dizziness and giddiness: Secondary | ICD-10-CM | POA: Insufficient documentation

## 2022-12-09 DIAGNOSIS — N898 Other specified noninflammatory disorders of vagina: Secondary | ICD-10-CM | POA: Diagnosis not present

## 2022-12-09 LAB — COMPREHENSIVE METABOLIC PANEL
ALT: 25 U/L (ref 0–44)
AST: 30 U/L (ref 15–41)
Albumin: 4.3 g/dL (ref 3.5–5.0)
Alkaline Phosphatase: 115 U/L (ref 38–126)
Anion gap: 10 (ref 5–15)
BUN: 6 mg/dL (ref 6–20)
CO2: 26 mmol/L (ref 22–32)
Calcium: 9.4 mg/dL (ref 8.9–10.3)
Chloride: 105 mmol/L (ref 98–111)
Creatinine, Ser: 0.7 mg/dL (ref 0.44–1.00)
GFR, Estimated: >60 mL/min (ref >60)
Glucose, Bld: 73 mg/dL (ref 70–99)
Potassium: 3.8 mmol/L (ref 3.5–5.1)
Sodium: 141 mmol/L (ref 135–145)
Total Bilirubin: 1.1 mg/dL (ref 0.3–1.2)
Total Protein: 7.5 g/dL (ref 6.5–8.1)

## 2022-12-09 LAB — CBC WITH DIFFERENTIAL/PLATELET
Abs Immature Granulocytes: 0.01 10*3/uL (ref 0.00–0.07)
Basophils Absolute: 0 10*3/uL (ref 0.0–0.1)
Basophils Relative: 1 %
Eosinophils Absolute: 0.1 10*3/uL (ref 0.0–0.5)
Eosinophils Relative: 2 %
HCT: 41.8 % (ref 36.0–46.0)
Hemoglobin: 13.7 g/dL (ref 12.0–15.0)
Immature Granulocytes: 0 %
Lymphocytes Relative: 28 %
Lymphs Abs: 1.7 10*3/uL (ref 0.7–4.0)
MCH: 28.7 pg (ref 26.0–34.0)
MCHC: 32.8 g/dL (ref 30.0–36.0)
MCV: 87.4 fL (ref 80.0–100.0)
Monocytes Absolute: 0.4 10*3/uL (ref 0.1–1.0)
Monocytes Relative: 6 %
Neutro Abs: 3.7 10*3/uL (ref 1.7–7.7)
Neutrophils Relative %: 63 %
Platelets: 245 10*3/uL (ref 150–400)
RBC: 4.78 MIL/uL (ref 3.87–5.11)
RDW: 13.4 % (ref 11.5–15.5)
WBC: 5.9 10*3/uL (ref 4.0–10.5)
nRBC: 0 % (ref 0.0–0.2)

## 2022-12-09 LAB — POCT URINALYSIS DIP (MANUAL ENTRY)
Bilirubin, UA: NEGATIVE
Blood, UA: NEGATIVE
Glucose, UA: NEGATIVE mg/dL
Ketones, POC UA: NEGATIVE mg/dL
Leukocytes, UA: NEGATIVE
Nitrite, UA: NEGATIVE
Protein Ur, POC: NEGATIVE mg/dL
Spec Grav, UA: 1.02 (ref 1.010–1.025)
Urobilinogen, UA: 0.2 E.U./dL
pH, UA: 5.5 (ref 5.0–8.0)

## 2022-12-09 LAB — HIV ANTIBODY (ROUTINE TESTING W REFLEX): HIV Screen 4th Generation wRfx: NONREACTIVE

## 2022-12-09 LAB — TSH: TSH: 0.626 u[IU]/mL (ref 0.350–4.500)

## 2022-12-09 LAB — POCT URINE PREGNANCY: Preg Test, Ur: NEGATIVE

## 2022-12-09 LAB — VITAMIN B12: Vitamin B-12: 5511 pg/mL — ABNORMAL HIGH (ref 180–914)

## 2022-12-09 LAB — POCT FASTING CBG KUC MANUAL ENTRY: POCT Glucose (KUC): 83 mg/dL (ref 70–99)

## 2022-12-09 NOTE — Discharge Instructions (Addendum)
Your EKG did show that you had a slightly low heart rate.  This could be contributing to dizziness/lightheadedness.  If you have recurrent lightheadedness and your heart rate is under 60 please go to the emergency room.  Make sure that you are drinking plenty of fluid and eat small frequent meals.  Follow-up with your primary care soon as possible.  I would like you to follow-up with cardiology given the slow heart rate and symptoms.  Call them to schedule appointment.  As we discussed, I also think you might benefit from seeing a neurologist.  Call them to schedule appointment as well.  I will contact you if any of your lab work is abnormal.  As we discussed, you should have a low threshold for going to the emergency room if you have any recurrent lightheadedness with low heart rate, passing out, weakness, chest pain, shortness of breath you need to go to the emergency room.

## 2022-12-09 NOTE — ED Provider Notes (Signed)
[facility]    CSN: [id number] Arrival date & time: 12/09/22  1106      History   Chief Complaint Chief Complaint  Patient presents with   Dizziness    HPI Melanie Cisneros is a 32 y.o. female.   Patient presents today with several concerns.  Her primary concern today is a constellation of symptoms that have been occurring intermittently since she had gastric bypass in September 2023.  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  Since then she has had a constellation of symptoms including fatigue, light sensitivity and feeling as though there is pressure in her eyes that results in change in her vision, intermittent numbness and tingling in her face on both sides, head fullness, neck discomfort, feeling that there is something wrong in her body.  She has been evaluated by primary care and encouraged to follow-up with her bariatric surgery.  She reports having negative blood work back in April 2024 but does not member the specific testing and these tests are not available in Care Everywhere.  She denies any recent medication changes.  She denies any known blood loss including heavy menstrual cycles, melena, hematochezia.  She has had issues with hypokalemia in the past and wonders if this could be contributing her symptoms prompting evaluation today.  She denies personal or family history of neurological condition including multiple sclerosis.  She has been evaluated by ophthalmology and had negative workup.  She denies any additional past medical history.  Reports she is concerned that there is something going on in her body since the surgery.  In addition, she is requesting STI testing.  She has a history of recurrent bacterial vaginosis who is experiencing some discharge consistent with this condition.  It has been a while since she has had complete STI panel and so interested in having this obtained  today.  Denies any specific exposures and reports she has not been sexually active recently.  She denies any fever, pelvic pain, abdominal pain, nausea, vomiting.    Past Medical History:  Diagnosis Date   Bacterial vaginosis    Chlamydia    Gonorrhea    Obesity    UTI (lower urinary tract infection)     Patient Active Problem List   Diagnosis Date Noted   Orthostatic lightheadedness 09/13/2022   Hypokalemia 07/06/2022   Anxious mood 07/06/2022   Gonorrhea 11/09/2021   Ovarian cyst 11/09/2021   Bacterial vaginosis 05/13/2020   BMI 45.0-49.9, adult (HCC) 06/06/2016   Psychosocial stressors 02/25/2016   Ptyalism 02/25/2016   Rubella non-immune status, antepartum 01/27/2016    Past Surgical History:  Procedure Laterality Date   GASTRIC BYPASS     WISDOM TOOTH EXTRACTION      OB History     Gravida  1   Para  1   Term  1   Preterm  0   AB  0   Living  1      SAB  0   IAB  0   Ectopic  0   Multiple  0   Live Births  1            Home Medications    Prior to Admission medications   Not on File    Family History Family History  Problem Relation Age of Onset   Diabetes Father    Hypertension Mother     Social History Social History   Tobacco  Use   Smoking status: Never   Smokeless tobacco: Never  Vaping Use   Vaping status: Never Used  Substance Use Topics   Alcohol use: Not Currently   Drug use: No     Allergies   Amoxicillin, Penicillins, and Pork allergy   Review of Systems Review of Systems  Constitutional:  Positive for activity change and fatigue. Negative for appetite change and fever.  HENT:  Negative for congestion and sore throat.   Eyes:  Positive for photophobia and visual disturbance.  Respiratory:  Negative for shortness of breath.   Cardiovascular:  Negative for chest pain.  Gastrointestinal:  Negative for abdominal pain, diarrhea, nausea and vomiting.  Genitourinary:  Positive for vaginal discharge.  Negative for vaginal bleeding and vaginal pain.  Neurological:  Positive for light-headedness, numbness (face) and headaches (fullness). Negative for dizziness, tremors, seizures, syncope, facial asymmetry, speech difficulty and weakness.     Physical Exam Triage Vital Signs ED Triage Vitals  Encounter Vitals Group     BP 12/09/22 1214 112/76     Systolic BP Percentile --      Diastolic BP Percentile --      Pulse Rate 12/09/22 1214 82     Resp 12/09/22 1214 16     Temp 12/09/22 1214 98.1 F (36.7 C)     Temp Source 12/09/22 1214 Oral     SpO2 12/09/22 1214 97 %     Weight 12/09/22 1214 165 lb (74.8 kg)     Height 12/09/22 1214 5\' 5"  (1.651 m)     Head Circumference --      Peak Flow --      Pain Score 12/09/22 1213 8     Pain Loc --      Pain Education --      Exclude from Growth Chart --    Orthostatic VS for the past 24 hrs:  BP- Lying Pulse- Lying BP- Sitting Pulse- Sitting BP- Standing at 0 minutes Pulse- Standing at 0 minutes  12/09/22 1330 109/68 53 109/70 58 116/77 57    Updated Vital Signs BP 112/76 (BP Location: Left Arm)   Pulse 82   Temp 98.1 F (36.7 C) (Oral)   Resp 16   Ht 5\' 5"  (1.651 m)   Wt 165 lb (74.8 kg)   LMP 12/08/2022 (Exact Date)   SpO2 97%   BMI 27.46 kg/m   Visual Acuity Right Eye Distance:   Left Eye Distance:   Bilateral Distance:    Right Eye Near:   Left Eye Near:    Bilateral Near:     Physical Exam Vitals reviewed.  Constitutional:      General: She is awake. She is not in acute distress.    Appearance: Normal appearance. She is well-developed. She is not ill-appearing.     Comments: Very pleasant female appears stated age in no acute distress sitting comfortably in exam room  HENT:     Head: Normocephalic and atraumatic. No raccoon eyes, Battle's sign or contusion.     Right Ear: Tympanic membrane, ear canal and external ear normal. No hemotympanum.     Left Ear: Tympanic membrane, ear canal and external ear normal. No  hemotympanum.     Mouth/Throat:     Tongue: Tongue does not deviate from midline.     Pharynx: Uvula midline. No oropharyngeal exudate or posterior oropharyngeal erythema.  Eyes:     Extraocular Movements: Extraocular movements intact.     Conjunctiva/sclera: Conjunctivae normal.  Pupils: Pupils are equal, round, and reactive to light.  Cardiovascular:     Rate and Rhythm: Normal rate and regular rhythm.     Heart sounds: Normal heart sounds, S1 normal and S2 normal. No murmur heard. Pulmonary:     Effort: Pulmonary effort is normal.     Breath sounds: Normal breath sounds. No wheezing, rhonchi or rales.     Comments: Clear to auscultation bilaterally Abdominal:     General: Bowel sounds are normal.     Palpations: Abdomen is soft.     Tenderness: There is no abdominal tenderness.  Musculoskeletal:     Cervical back: No spinous process tenderness or muscular tenderness.     Comments: Strength 5/5 bilateral upper and lower extremities  Neurological:     General: No focal deficit present.     Mental Status: She is alert and oriented to person, place, and time.     Cranial Nerves: Cranial nerves 2-12 are intact.     Motor: Motor function is intact.     Coordination: Coordination is intact.     Gait: Gait is intact.     Comments: Cranial nerves II through XII grossly intact.  No focal neurological defect on exam.  Psychiatric:        Behavior: Behavior is cooperative.      UC Treatments / Results  Labs (all labs ordered are listed, but only abnormal results are displayed) Labs Reviewed  CBC WITH DIFFERENTIAL/PLATELET  COMPREHENSIVE METABOLIC PANEL  HIV ANTIBODY (ROUTINE TESTING W REFLEX)  RPR  TSH  VITAMIN B12  POCT URINALYSIS DIP (MANUAL ENTRY)  POCT URINE PREGNANCY  POCT FASTING CBG [facility] MANUAL ENTRY    EKG   Radiology No results found.  Procedures Procedures (including critical care time)  Medications Ordered in UC Medications - No data to  display  Initial Impression / Assessment and Plan / UC Course  I have reviewed the triage vital signs and the nursing notes.  Pertinent labs & imaging results that were available during my care of the patient were reviewed by me and considered in my medical decision making (see chart for details).     Patient is well-appearing, afebrile, nontoxic, nontachycardic.  Vital signs and physical exam are reassuring with no indication for emergent evaluation or imaging.  EKG was obtained that showed sinus bradycardia with ventricular rate of 51 bpm without ischemic changes; compared to 09/05/2022 tracing sinus bradycardia replaces normal sinus rhythm without other significant changes.  Patient was noted to be mildly bradycardic but on her orthostatic vital signs that she did have elevation in her heart rate to 80 is not my criteria for autonomic dysfunction.  Orthostatic blood pressures were appropriate.  Urine was normal.  Urine pregnancy was negative.  Random glucose was appropriate.  We discussed that her symptoms could be related to a variety of causes including macronutrient deficiency related to her gastric bypass.  Unfortunately, we are limited in the labs that can be drawn so basic blood work including TSH, CBC, CMP, B12 was obtained.  Recommended that she follow-up with neurology given her constellation of symptoms.  She was also encouraged to follow-up with cardiology given her bradycardia.  We discussed potential utility of going to the emergency room given symptomatic bradycardia but patient reports that her lightheadedness is intermittent she is currently not experiencing symptoms.  We recommend she follow-up with cardiology soon as possible and if she develops any symptoms she is to go to the ER.  We discussed how  to take her pulse at home.  She is to rest and drink plenty of fluids.  Recommend she eat small frequent meals.  Encouraged her to follow-up closely with her primary care.  We discussed the  importance of follow-up with specialist and PCP to investigate symptoms further since we have limited capabilities in urgent care.  Strict return precautions given.  Excuse note provided.  Vital signs and physical exam are reassuring with no indication for evaluation or imaging.  STI testing including STI swab, RPR, HIV obtained and are pending.  Patient had negative hepatitis testing in the past.  We will contact her if need to arrange any treatment.  Discussed that if she develops any worsening symptoms she should return for reevaluation.  Final Clinical Impressions(s) / UC Diagnoses   Final diagnoses:  Episodic lightheadedness  Facial numbness  Fullness in head  Palpitations  History of gastric bypass  Vaginal discharge  Screening examination for STI     Discharge Instructions      Your EKG did show that you had a slightly low heart rate.  This could be contributing to dizziness/lightheadedness.  If you have recurrent lightheadedness and your heart rate is under 60 please go to the emergency room.  Make sure that you are drinking plenty of fluid and eat small frequent meals.  Follow-up with your primary care soon as possible.  I would like you to follow-up with cardiology given the slow heart rate and symptoms.  Call them to schedule appointment.  As we discussed, I also think you might benefit from seeing a neurologist.  Call them to schedule appointment as well.  I will contact you if any of your lab work is abnormal.  As we discussed, you should have a low threshold for going to the emergency room if you have any recurrent lightheadedness with low heart rate, passing out, weakness, chest pain, shortness of breath you need to go to the emergency room.     ED Prescriptions   None    PDMP not reviewed this encounter.   Jeani Hawking, PA-C 12/09/22 1401

## 2022-12-09 NOTE — ED Triage Notes (Signed)
Patient here today with c/o lightheadedness off and on since having her gastric bypass surgery in September. Has been happening more frequent over the past couple of weeks. This morning was the worst. She has also been having some numbness on her face. Irregular heartbeat. Has had low potassium in the past.  She's also been having some belly pain and would like to be checked for BV.

## 2022-12-10 ENCOUNTER — Encounter: Payer: Self-pay | Admitting: Student

## 2022-12-13 DIAGNOSIS — Z9884 Bariatric surgery status: Secondary | ICD-10-CM | POA: Diagnosis not present

## 2022-12-13 DIAGNOSIS — Z6827 Body mass index (BMI) 27.0-27.9, adult: Secondary | ICD-10-CM | POA: Diagnosis not present

## 2022-12-13 DIAGNOSIS — F419 Anxiety disorder, unspecified: Secondary | ICD-10-CM | POA: Diagnosis not present

## 2022-12-13 DIAGNOSIS — R7303 Prediabetes: Secondary | ICD-10-CM | POA: Diagnosis not present

## 2022-12-13 DIAGNOSIS — K912 Postsurgical malabsorption, not elsewhere classified: Secondary | ICD-10-CM | POA: Diagnosis not present

## 2023-01-20 ENCOUNTER — Telehealth: Payer: Medicaid Other | Admitting: Physician Assistant

## 2023-01-20 ENCOUNTER — Encounter: Payer: Self-pay | Admitting: Student

## 2023-01-20 DIAGNOSIS — B3731 Acute candidiasis of vulva and vagina: Secondary | ICD-10-CM

## 2023-01-20 MED ORDER — FLUCONAZOLE 150 MG PO TABS
150.0000 mg | ORAL_TABLET | ORAL | 0 refills | Status: DC | PRN
Start: 2023-01-20 — End: 2023-04-16

## 2023-01-20 NOTE — Progress Notes (Signed)
Virtual Visit Consent   Melanie Cisneros, you are scheduled for a virtual visit with a [facility] provider today. Just as with appointments in the office, your consent must be obtained to participate. Your consent will be active for this visit and any virtual visit you may have with one of our providers in the next 365 days. If you have a MyChart account, a copy of this consent can be sent to you electronically.  As this is a virtual visit, video technology does not allow for your provider to perform a traditional examination. This may limit your provider's ability to fully assess your condition. If your provider identifies any concerns that need to be evaluated in person or the need to arrange testing (such as labs, EKG, etc.), we will make arrangements to do so. Although advances in technology are sophisticated, we cannot ensure that it will always work on either your end or our end. If the connection with a video visit is poor, the visit may have to be switched to a telephone visit. With either a video or telephone visit, we are not always able to ensure that we have a secure connection.  By engaging in this virtual visit, you consent to the provision of healthcare and authorize for your insurance to be billed (if applicable) for the services provided during this visit. Depending on your insurance coverage, you may receive a charge related to this service.  I need to obtain your verbal consent now. Are you willing to proceed with your visit today? Melanie Cisneros has provided verbal consent on 01/20/2023 for a virtual visit (video or telephone). Margaretann Loveless, PA-C  Date: 01/20/2023 6:08 PM  Virtual Visit via Video Note   I, Margaretann Loveless, connected with  Melanie Cisneros  ([id number], 07/11/1990) on 01/20/23 at  6:00 PM EDT by a video-enabled telemedicine application and verified that I am speaking with the correct person using two identifiers.  Location: Patient: Virtual Visit  Location Patient: Home Provider: Virtual Visit Location Provider: Home Office   I discussed the limitations of evaluation and management by telemedicine and the availability of in person appointments. The patient expressed understanding and agreed to proceed.    History of Present Illness: Melanie Cisneros is a 32 y.o. who identifies as a female who was assigned female at birth, and is being seen today for vaginal itching.  HPI: Vaginal Itching The patient's primary symptoms include genital itching. The patient's pertinent negatives include no genital odor, genital rash, missed menses, vaginal bleeding or vaginal discharge. This is a new problem. The current episode started in the past 7 days (3-4 days). The problem occurs constantly. The problem has been gradually worsening. The patient is experiencing no pain. She is not pregnant. Pertinent negatives include no back pain, chills, dysuria, fever, flank pain or nausea. The symptoms are aggravated by tactile pressure. She has tried nothing for the symptoms. The treatment provided no relief. She is not sexually active. No, her partner does not have an STD.     Problems:  Patient Active Problem List   Diagnosis Date Noted   Orthostatic lightheadedness 09/13/2022   Hypokalemia 07/06/2022   Anxious mood 07/06/2022   Gonorrhea 11/09/2021   Ovarian cyst 11/09/2021   Bacterial vaginosis 05/13/2020   BMI 45.0-49.9, adult (HCC) 06/06/2016   Psychosocial stressors 02/25/2016   Ptyalism 02/25/2016   Rubella non-immune status, antepartum 01/27/2016    Allergies:  Allergies  Allergen Reactions   Amoxicillin Anaphylaxis, Hives and  Other (See Comments)    Has patient had a PCN reaction causing immediate rash, facial/tongue/throat swelling, SOB or lightheadedness with hypotension: Yes Has patient had a PCN reaction causing severe rash involving mucus membranes or skin necrosis: No Has patient had a PCN reaction that required hospitalization No Has  patient had a PCN reaction occurring within the last 10 years: No If all of the above answers are "NO", then may proceed with Cephalosporin use.   Penicillins Anaphylaxis, Hives and Other (See Comments)    Has patient had a PCN reaction causing immediate rash, facial/tongue/throat swelling, SOB or lightheadedness with hypotension: Yes Has patient had a PCN reaction causing severe rash involving mucus membranes or skin necrosis: No Has patient had a PCN reaction that required hospitalization No Has patient had a PCN reaction occurring within the last 10 years: No If all of the above answers are "NO", then may proceed with Cephalosporin use.   Pork Allergy Other (See Comments)    Pt has tolerated multiple doses of heparin   Medications:  Current Outpatient Medications:    fluconazole (DIFLUCAN) 150 MG tablet, Take 1 tablet (150 mg total) by mouth every 3 (three) days as needed., Disp: 2 tablet, Rfl: 0  Observations/Objective: Patient is well-developed, well-nourished in no acute distress.  Resting comfortably at home.  Head is normocephalic, atraumatic.  No labored breathing.  Speech is clear and coherent with logical content.  Patient is alert and oriented at baseline.    Assessment and Plan: 1. Yeast vaginitis - fluconazole (DIFLUCAN) 150 MG tablet; Take 1 tablet (150 mg total) by mouth every 3 (three) days as needed.  Dispense: 2 tablet; Refill: 0  - Symptoms consistent with yeast vaginitis - Fluconazole prescribed - Limit bubble baths, scented lotions/soaps/detergents - Limit tight fitting clothing - Seek on person evaluation if not improving or if symptoms worsen   Follow Up Instructions: I discussed the assessment and treatment plan with the patient. The patient was provided an opportunity to ask questions and all were answered. The patient agreed with the plan and demonstrated an understanding of the instructions.  A copy of instructions were sent to the patient via MyChart  unless otherwise noted below.    The patient was advised to call back or seek an in-person evaluation if the symptoms worsen or if the condition fails to improve as anticipated.  Time:  I spent 8 minutes with the patient via telehealth technology discussing the above problems/concerns.    Margaretann Loveless, PA-C

## 2023-01-20 NOTE — Patient Instructions (Signed)
Melanie Cisneros, thank you for joining Margaretann Loveless, PA-C for today's virtual visit.  While this provider is not your primary care provider (PCP), if your PCP is located in our provider database this encounter information will be shared with them immediately following your visit.   A [facility] MyChart account gives you access to today's visit and all your visits, tests, and labs performed at [facility] " click here if you don't have a [facility] MyChart account or go to [email or web address]  Consent: (Patient) Melanie Cisneros provided verbal consent for this virtual visit at the beginning of the encounter.  Current Medications:  Current Outpatient Medications:    fluconazole (DIFLUCAN) 150 MG tablet, Take 1 tablet (150 mg total) by mouth every 3 (three) days as needed., Disp: 2 tablet, Rfl: 0   Medications ordered in this encounter:  Meds ordered this encounter  Medications   fluconazole (DIFLUCAN) 150 MG tablet    Sig: Take 1 tablet (150 mg total) by mouth every 3 (three) days as needed.    Dispense:  2 tablet    Refill:  0    Order Specific Question:   Supervising Provider    Answer:   Merrilee Jansky [id number]     *If you need refills on other medications prior to your next appointment, please contact your pharmacy*  Follow-Up: Call back or seek an in-person evaluation if the symptoms worsen or if the condition fails to improve as anticipated.  [facility] [phone]  Other Instructions Vaginal Yeast Infection, Adult  Vaginal yeast infection is a condition that causes vaginal discharge as well as soreness, swelling, and redness (inflammation) of the vagina. This is a common condition. Some women get this infection frequently. What are the causes? This condition is caused by a change in the normal balance of the yeast (Candida) and normal bacteria that live in the vagina. This change causes an overgrowth of yeast, which causes  the inflammation. What increases the risk? The condition is more likely to develop in women who: Take antibiotic medicines. Have diabetes. Take birth control pills. Are pregnant. Douche often. Have a weak body defense system (immune system). Have been taking steroid medicines for a long time. Frequently wear tight clothing. What are the signs or symptoms? Symptoms of this condition include: White, thick, creamy vaginal discharge. Swelling, itching, redness, and irritation of the vagina. The lips of the vagina (labia) may be affected as well. Pain or a burning feeling while urinating. Pain during sex. How is this diagnosed? This condition is diagnosed based on: Your medical history. A physical exam. A pelvic exam. Your health care provider will examine a sample of your vaginal discharge under a microscope. Your health care provider may send this sample for testing to confirm the diagnosis. How is this treated? This condition is treated with medicine. Medicines may be over-the-counter or prescription. You may be told to use one or more of the following: Medicine that is taken by mouth (orally). Medicine that is applied as a cream (topically). Medicine that is inserted directly into the vagina (suppository). Follow these instructions at home: Take or apply over-the-counter and prescription medicines only as told by your health care provider. Do not use tampons until your health care provider approves. Do not have sex until your infection has cleared. Sex can prolong or worsen your symptoms of infection. Ask your health care provider when it is safe to resume sexual activity. Keep all follow-up visits. This is  important. How is this prevented?  Do not wear tight clothes, such as pantyhose or tight pants. Wear breathable cotton underwear. Do not use douches, perfumed soap, creams, or powders. Wipe from front to back after using the toilet. If you have diabetes, keep your blood sugar  levels under control. Ask your health care provider for other ways to prevent yeast infections. Contact a health care provider if: You have a fever. Your symptoms go away and then return. Your symptoms do not get better with treatment. Your symptoms get worse. You have new symptoms. You develop blisters in or around your vagina. You have blood coming from your vagina and it is not your menstrual period. You develop pain in your abdomen. Summary Vaginal yeast infection is a condition that causes discharge as well as soreness, swelling, and redness (inflammation) of the vagina. This condition is treated with medicine. Medicines may be over-the-counter or prescription. Take or apply over-the-counter and prescription medicines only as told by your health care provider. Do not douche. Resume sexual activity or use of tampons as instructed by your health care provider. Contact a health care provider if your symptoms do not get better with treatment or your symptoms go away and then return. This information is not intended to replace advice given to you by your health care provider. Make sure you discuss any questions you have with your health care provider. Document Revised: 07/20/2020 Document Reviewed: 07/20/2020 Elsevier Patient Education  2024 [employer]    If you have been instructed to have an in-person evaluation today at a local Urgent Care facility, please use the link below. It will take you to a list of all of our available [facility], including address, phone number and hours of operation. Please do not delay care.  [facility]  If you or a family member do not have a primary care provider, use the link below to schedule a visit and establish care. When you choose a [facility] primary care physician or advanced practice provider, you gain a long-term partner in health. Find a Primary Care Provider  Learn more about [facility]'s in-office and virtual  care options: [facility] - Get Care Now

## 2023-01-31 ENCOUNTER — Encounter: Payer: Self-pay | Admitting: Family Medicine

## 2023-01-31 ENCOUNTER — Ambulatory Visit (INDEPENDENT_AMBULATORY_CARE_PROVIDER_SITE_OTHER): Payer: Medicaid Other | Admitting: Family Medicine

## 2023-01-31 ENCOUNTER — Other Ambulatory Visit (HOSPITAL_COMMUNITY)
Admission: RE | Admit: 2023-01-31 | Discharge: 2023-01-31 | Disposition: A | Discharge status: Home / Self Care | Payer: Medicaid Other | Source: Ambulatory Visit | Attending: Family Medicine | Admitting: Family Medicine

## 2023-01-31 VITALS — BP 120/82 | HR 71 | Ht 65.0 in | Wt 169.0 lb

## 2023-01-31 DIAGNOSIS — Z3009 Encounter for other general counseling and advice on contraception: Secondary | ICD-10-CM

## 2023-01-31 DIAGNOSIS — Z113 Encounter for screening for infections with a predominantly sexual mode of transmission: Secondary | ICD-10-CM | POA: Diagnosis not present

## 2023-01-31 DIAGNOSIS — Z0000 Encounter for general adult medical examination without abnormal findings: Secondary | ICD-10-CM | POA: Diagnosis not present

## 2023-01-31 MED ORDER — NORGESTIMATE-ETH ESTRADIOL 0.25-35 MG-MCG PO TABS
1.0000 | ORAL_TABLET | Freq: Every day | ORAL | 11 refills | Status: DC
Start: 2023-01-31 — End: 2023-07-15

## 2023-01-31 NOTE — Patient Instructions (Addendum)
Today at your annual preventive visit we talked about the following measures:   I recommend 150 minutes of exercise per week-try 30 minutes 5 days per week We discussed reducing sugary beverages (like soda and juice) and increasing leafy greens and whole fruits.  We discussed avoiding tobacco and alcohol.  I recommend avoiding illicit substances.  Your blood pressure is 120/82 at goal which is at goal.  It was great to see you! Thank you for allowing me to participate in your care!  Our plans for today:  - I will let you know if any of your lab results today are abnormal. - Please drop off paperwork for you to be filled out and I will fill out for you. - Have sent the birth control to your pharmacy for you to pick up.   Please arrive 15 minutes PRIOR to your next scheduled appointment time! If you do not, this affects OTHER patients' care.  Take care and seek immediate care sooner if you develop any concerns.   Celine Mans, MD, PGY-2 [facility] 3:53 PM 01/31/2023  [facility]
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
## 2023-02-01 ENCOUNTER — Encounter: Payer: Self-pay | Admitting: Family Medicine

## 2023-02-02 LAB — CERVICOVAGINAL ANCILLARY ONLY
Bacterial Vaginitis (gardnerella): NEGATIVE
Candida Glabrata: NEGATIVE
Candida Vaginitis: NEGATIVE
Chlamydia: NEGATIVE
Comment: NEGATIVE
Comment: NEGATIVE
Comment: NEGATIVE
Comment: NEGATIVE
Comment: NORMAL
Neisseria Gonorrhea: NEGATIVE

## 2023-02-03 LAB — QUANTIFERON-TB GOLD PLUS
QuantiFERON Mitogen Value: >10 IU/mL
QuantiFERON Nil Value: 0 IU/mL
QuantiFERON TB1 Ag Value: 0 IU/mL
QuantiFERON TB2 Ag Value: 0 IU/mL
QuantiFERON-TB Gold Plus: NEGATIVE

## 2023-02-03 LAB — LIPID PANEL
Chol/HDL Ratio: 2.2 ratio (ref 0.0–4.4)
Cholesterol, Total: 151 mg/dL (ref 100–199)
HDL: 68 mg/dL (ref >39)
LDL Chol Calc (NIH): 73 mg/dL (ref 0–99)
Triglycerides: 42 mg/dL (ref 0–149)
VLDL Cholesterol Cal: 10 mg/dL (ref 5–40)

## 2023-02-09 ENCOUNTER — Ambulatory Visit: Payer: Medicaid Other | Admitting: Internal Medicine

## 2023-02-22 DIAGNOSIS — Z9884 Bariatric surgery status: Secondary | ICD-10-CM | POA: Diagnosis not present

## 2023-02-22 DIAGNOSIS — K912 Postsurgical malabsorption, not elsewhere classified: Secondary | ICD-10-CM | POA: Diagnosis not present

## 2023-03-18 DIAGNOSIS — R0602 Shortness of breath: Secondary | ICD-10-CM | POA: Diagnosis not present

## 2023-03-18 DIAGNOSIS — R002 Palpitations: Secondary | ICD-10-CM | POA: Diagnosis not present

## 2023-03-18 DIAGNOSIS — Z5321 Procedure and treatment not carried out due to patient leaving prior to being seen by health care provider: Secondary | ICD-10-CM | POA: Diagnosis not present

## 2023-03-22 DIAGNOSIS — R112 Nausea with vomiting, unspecified: Secondary | ICD-10-CM | POA: Diagnosis not present

## 2023-03-22 DIAGNOSIS — Z9889 Other specified postprocedural states: Secondary | ICD-10-CM | POA: Diagnosis not present

## 2023-03-22 DIAGNOSIS — Z9884 Bariatric surgery status: Secondary | ICD-10-CM | POA: Diagnosis not present

## 2023-03-22 DIAGNOSIS — E162 Hypoglycemia, unspecified: Secondary | ICD-10-CM | POA: Diagnosis not present

## 2023-03-28 ENCOUNTER — Ambulatory Visit: Payer: Medicaid Other | Attending: Internal Medicine | Admitting: Internal Medicine

## 2023-03-28 NOTE — Progress Notes (Deleted)
  Cardiology Office Note:  .    Date:  03/28/2023  ID:  Melanie Cisneros, DOB 10-Dec-1990, MRN [id number] PCP: Darral Dash, DO  [facility] Providers Cardiologist:  None { Click to update primary MD,subspecialty MD or APP then REFRESH:1}    CC: *** Consulted for the evaluation of *** at the behest of ***   History of Present Illness: .    Melanie Cisneros is a 32 y.o. female ***    Relevant histories: .  Social *** ROS: As per HPI.   Studies Reviewed: .       *** Risk Assessment/Calculations:    {Does this patient have ATRIAL FIBRILLATION?:[id number]}       Physical Exam:    VS:  There were no vitals taken for this visit.   Wt Readings from Last 3 Encounters:  01/31/23 169 lb (76.7 kg)  12/09/22 165 lb (74.8 kg)  09/13/22 182 lb 3.2 oz (82.6 kg)    Gen: *** distress, *** obese/well nourished/malnourished   Neck: No JVD, *** carotid bruit Ears: *** Frank Sign Cardiac: No Rubs or Gallops, *** Murmur, ***cardia, *** radial pulses Respiratory: Clear to auscultation bilaterally, *** effort, ***  respiratory rate GI: Soft, nontender, non-distended *** MS: No *** edema; *** moves all extremities Integument: Skin feels *** Neuro:  At time of evaluation, alert and oriented to person/place/time/situation *** Psych: Normal affect, patient feels ***   ASSESSMENT AND PLAN: .    ***  Iron Deficiency Iron deficiency is one of the most frequent nutritional issues after gastric bypass: Occurs in 30-50% of patients within 5 years post-surgery3 Main cause of post-bariatric anemia Due to reduced absorption in bypassed duodenum and proximal jejunum, as well as decreased intake of iron-rich foods3 Vitamin B12 Deficiency Vitamin B12 deficiency is another major concern: Prevalence of 19-35% after 5 years3 Caused by inadequate intrinsic factor secretion and bypassing of the duodenum Can lead to anemia and neurological symptoms if left untreated3 Folate (Vitamin  B9) Deficiency Prevalence ranges from 9% to 39% after surgery3 Primarily due to reduced dietary intake rather than malabsorption Can contribute to anemia and cause congenital defects in pregnant women3 Vitamin D and Calcium Deficiency Vitamin D deficiency prevalence varies between 25% and 73%3 Calcium deficiency occurs in about 10% of patients3 Both contribute to accelerated bone loss after surgery Other Common Deficiencies Thiamine (Vitamin B1): Deficiency can occur in up to 49% of patients3 Vitamin A: 10-11% deficiency rate in the first year after RYGB2 Vitamin C: 34.6% deficiency rate in one study2 Copper: One of the most clinically relevant deficiencies   Riley Lam, MD FASE [facility] Cardiologist [facility]  [address] [place], [address] [place], [place] [address] [phone]  8:56 AM

## 2023-03-29 ENCOUNTER — Encounter: Payer: Self-pay | Admitting: Internal Medicine

## 2023-04-16 ENCOUNTER — Telehealth: Payer: Medicaid Other | Admitting: Family

## 2023-04-16 DIAGNOSIS — N898 Other specified noninflammatory disorders of vagina: Secondary | ICD-10-CM | POA: Diagnosis not present

## 2023-04-16 DIAGNOSIS — B3731 Acute candidiasis of vulva and vagina: Secondary | ICD-10-CM

## 2023-04-16 MED ORDER — FLUCONAZOLE 150 MG PO TABS: 150.0000 mg | ORAL_TABLET | ORAL | 0 refills | Status: DC | PRN

## 2023-04-16 NOTE — Patient Instructions (Signed)

## 2023-04-16 NOTE — Progress Notes (Signed)
Virtual Visit Consent   Melanie Cisneros, you are scheduled for a virtual visit with a [facility] provider today. Just as with appointments in the office, your consent must be obtained to participate. Your consent will be active for this visit and any virtual visit you may have with one of our providers in the next 365 days. If you have a MyChart account, a copy of this consent can be sent to you electronically.  As this is a virtual visit, video technology does not allow for your provider to perform a traditional examination. This may limit your provider's ability to fully assess your condition. If your provider identifies any concerns that need to be evaluated in person or the need to arrange testing (such as labs, EKG, etc.), we will make arrangements to do so. Although advances in technology are sophisticated, we cannot ensure that it will always work on either your end or our end. If the connection with a video visit is poor, the visit may have to be switched to a telephone visit. With either a video or telephone visit, we are not always able to ensure that we have a secure connection.  By engaging in this virtual visit, you consent to the provision of healthcare and authorize for your insurance to be billed (if applicable) for the services provided during this visit. Depending on your insurance coverage, you may receive a charge related to this service.  I need to obtain your verbal consent now. Are you willing to proceed with your visit today? Atiyana D Balian has provided verbal consent on 04/16/2023 for a virtual visit (video or telephone). Jannifer Rodney, FNP  Date: 04/16/2023 12:22 PM  Virtual Visit via Video Note   I, Jannifer Rodney, connected with  HAZELLE SERNA  ([id number], 1990/06/07) on 04/16/23 at 12:15 PM EST by a video-enabled telemedicine application and verified that I am speaking with the correct person using two identifiers.  Location: Patient: Virtual Visit Location  Patient: Home Provider: Virtual Visit Location Provider: Home Office   I discussed the limitations of evaluation and management by telemedicine and the availability of in person appointments. The patient expressed understanding and agreed to proceed.    History of Present Illness: Melanie Cisneros is a 32 y.o. who identifies as a female who was assigned female at birth, and is being seen today for vaginal discharge.  HPI: Vaginal Discharge The patient's primary symptoms include genital itching, a genital odor and vaginal discharge. The patient's pertinent negatives include no vaginal bleeding. This is a new problem. The current episode started in the past 7 days. The problem occurs intermittently. Pertinent negatives include no dysuria, flank pain or frequency. The vaginal discharge was white.    Problems:  Patient Active Problem List   Diagnosis Date Noted   Orthostatic lightheadedness 09/13/2022   Hypokalemia 07/06/2022   Anxious mood 07/06/2022   Gonorrhea 11/09/2021   Ovarian cyst 11/09/2021   Bacterial vaginosis 05/13/2020   BMI 45.0-49.9, adult (HCC) 06/06/2016   Psychosocial stressors 02/25/2016   Ptyalism 02/25/2016   Rubella non-immune status, antepartum 01/27/2016    Allergies:  Allergies  Allergen Reactions   Amoxicillin Anaphylaxis, Hives and Other (See Comments)    Has patient had a PCN reaction causing immediate rash, facial/tongue/throat swelling, SOB or lightheadedness with hypotension: Yes Has patient had a PCN reaction causing severe rash involving mucus membranes or skin necrosis: No Has patient had a PCN reaction that required hospitalization No Has patient had a PCN reaction occurring  within the last 10 years: No If all of the above answers are "NO", then may proceed with Cephalosporin use.   Penicillins Anaphylaxis, Hives and Other (See Comments)    Has patient had a PCN reaction causing immediate rash, facial/tongue/throat swelling, SOB or lightheadedness  with hypotension: Yes Has patient had a PCN reaction causing severe rash involving mucus membranes or skin necrosis: No Has patient had a PCN reaction that required hospitalization No Has patient had a PCN reaction occurring within the last 10 years: No If all of the above answers are "NO", then may proceed with Cephalosporin use.   Pork Allergy Other (See Comments)    Pt has tolerated multiple doses of heparin   Medications:  Current Outpatient Medications:    fluconazole (DIFLUCAN) 150 MG tablet, Take 1 tablet (150 mg total) by mouth every three (3) days as needed., Disp: 2 tablet, Rfl: 0   norgestimate-ethinyl estradiol (SPRINTEC 28) 0.25-35 MG-MCG tablet, Take 1 tablet by mouth daily., Disp: 28 tablet, Rfl: 11  Observations/Objective: Patient is well-developed, well-nourished in no acute distress.  Resting comfortably  at home.  Head is normocephalic, atraumatic.  No labored breathing. Speech is clear and coherent with logical content.  Patient is alert and oriented at baseline.    Assessment and Plan: 1. Vaginal discharge - fluconazole (DIFLUCAN) 150 MG tablet; Take 1 tablet (150 mg total) by mouth every three (3) days as needed.  Dispense: 2 tablet; Refill: 0  2. Vagina, candidiasis - fluconazole (DIFLUCAN) 150 MG tablet; Take 1 tablet (150 mg total) by mouth every three (3) days as needed.  Dispense: 2 tablet; Refill: 0  Start diflucan  Keep clean and dry Probiotic/ yogurt  Follow up if symptoms worsen or do not improve   Follow Up Instructions: I discussed the assessment and treatment plan with the patient. The patient was provided an opportunity to ask questions and all were answered. The patient agreed with the plan and demonstrated an understanding of the instructions.  A copy of instructions were sent to the patient via MyChart unless otherwise noted below.     The patient was advised to call back or seek an in-person evaluation if the symptoms worsen or if the  condition fails to improve as anticipated.    Jannifer Rodney, FNP

## 2023-04-30 ENCOUNTER — Telehealth: Payer: Medicaid Other | Admitting: Family

## 2023-04-30 DIAGNOSIS — B9689 Other specified bacterial agents as the cause of diseases classified elsewhere: Secondary | ICD-10-CM

## 2023-04-30 DIAGNOSIS — N760 Acute vaginitis: Secondary | ICD-10-CM

## 2023-04-30 MED ORDER — METRONIDAZOLE 500 MG PO TABS: 500.0000 mg | ORAL_TABLET | Freq: Two times a day (BID) | ORAL | 0 refills | Status: DC

## 2023-04-30 MED ORDER — FLUCONAZOLE 150 MG PO TABS: 150.0000 mg | ORAL_TABLET | ORAL | 0 refills | Status: DC | PRN

## 2023-04-30 NOTE — Progress Notes (Signed)
Virtual Visit Consent   PARNELL ELDER, you are scheduled for a virtual visit with a [facility] provider today. Just as with appointments in the office, your consent must be obtained to participate. Your consent will be active for this visit and any virtual visit you may have with one of our providers in the next 365 days. If you have a MyChart account, a copy of this consent can be sent to you electronically.  As this is a virtual visit, video technology does not allow for your provider to perform a traditional examination. This may limit your provider's ability to fully assess your condition. If your provider identifies any concerns that need to be evaluated in person or the need to arrange testing (such as labs, EKG, etc.), we will make arrangements to do so. Although advances in technology are sophisticated, we cannot ensure that it will always work on either your end or our end. If the connection with a video visit is poor, the visit may have to be switched to a telephone visit. With either a video or telephone visit, we are not always able to ensure that we have a secure connection.  By engaging in this virtual visit, you consent to the provision of healthcare and authorize for your insurance to be billed (if applicable) for the services provided during this visit. Depending on your insurance coverage, you may receive a charge related to this service.  I need to obtain your verbal consent now. Are you willing to proceed with your visit today? Melanie Cisneros has provided verbal consent on 04/30/2023 for a virtual visit (video or telephone). Jannifer Rodney, FNP  Date: 04/30/2023 12:36 PM  Virtual Visit via Video Note   I, Jannifer Rodney, connected with  Melanie Cisneros  ([id number], 06-08-1990) on 04/30/23 at 12:30 PM EST by a video-enabled telemedicine application and verified that I am speaking with the correct person using two identifiers.  Location: Patient: Virtual Visit Location  Patient: Home Provider: Virtual Visit Location Provider: Home Office   I discussed the limitations of evaluation and management by telemedicine and the availability of in person appointments. The patient expressed understanding and agreed to proceed.    History of Present Illness: Melanie Cisneros is a 32 y.o. who identifies as a female who was assigned female at birth, and is being seen today for recurrent vaginal discharge that started a week a half ago with odor.  HPI: Vaginal Discharge The patient's primary symptoms include a genital odor and vaginal discharge. The patient's pertinent negatives include no genital itching. This is a new problem. The current episode started in the past 7 days. The problem occurs intermittently. Pertinent negatives include no anorexia, chills, diarrhea, discolored urine, dysuria, fever, flank pain or frequency. The vaginal discharge was normal. She has tried antifungals for the symptoms. The treatment provided mild relief.    Problems:  Patient Active Problem List   Diagnosis Date Noted   Orthostatic lightheadedness 09/13/2022   Hypokalemia 07/06/2022   Anxious mood 07/06/2022   Gonorrhea 11/09/2021   Ovarian cyst 11/09/2021   Bacterial vaginosis 05/13/2020   BMI 45.0-49.9, adult (HCC) 06/06/2016   Psychosocial stressors 02/25/2016   Ptyalism 02/25/2016   Rubella non-immune status, antepartum 01/27/2016    Allergies:  Allergies  Allergen Reactions   Amoxicillin Anaphylaxis, Hives and Other (See Comments)    Has patient had a PCN reaction causing immediate rash, facial/tongue/throat swelling, SOB or lightheadedness with hypotension: Yes Has patient had a PCN reaction causing  severe rash involving mucus membranes or skin necrosis: No Has patient had a PCN reaction that required hospitalization No Has patient had a PCN reaction occurring within the last 10 years: No If all of the above answers are "NO", then may proceed with Cephalosporin use.    Penicillins Anaphylaxis, Hives and Other (See Comments)    Has patient had a PCN reaction causing immediate rash, facial/tongue/throat swelling, SOB or lightheadedness with hypotension: Yes Has patient had a PCN reaction causing severe rash involving mucus membranes or skin necrosis: No Has patient had a PCN reaction that required hospitalization No Has patient had a PCN reaction occurring within the last 10 years: No If all of the above answers are "NO", then may proceed with Cephalosporin use.   Pork Allergy Other (See Comments)    Pt has tolerated multiple doses of heparin   Medications:  Current Outpatient Medications:    metroNIDAZOLE (FLAGYL) 500 MG tablet, Take 1 tablet (500 mg total) by mouth 2 (two) times daily., Disp: 14 tablet, Rfl: 0   fluconazole (DIFLUCAN) 150 MG tablet, Take 1 tablet (150 mg total) by mouth every three (3) days as needed., Disp: 2 tablet, Rfl: 0   norgestimate-ethinyl estradiol (SPRINTEC 28) 0.25-35 MG-MCG tablet, Take 1 tablet by mouth daily., Disp: 28 tablet, Rfl: 11  Observations/Objective: Patient is well-developed, well-nourished in no acute distress.  Resting comfortably  at home.  Head is normocephalic, atraumatic.  No labored breathing.  Speech is clear and coherent with logical content.  Patient is alert and oriented at baseline.    Assessment and Plan: 1. Bacterial vaginosis (Primary) - metroNIDAZOLE (FLAGYL) 500 MG tablet; Take 1 tablet (500 mg total) by mouth 2 (two) times daily.  Dispense: 14 tablet; Refill: 0  I will prescribe the flagyl today for BV If symptoms return will need to be seen in person for further testing  Keep clean and dry Avoid any douching  Follow up if symptoms worsen or do not improve   Follow Up Instructions: I discussed the assessment and treatment plan with the patient. The patient was provided an opportunity to ask questions and all were answered. The patient agreed with the plan and demonstrated an  understanding of the instructions.  A copy of instructions were sent to the patient via MyChart unless otherwise noted below.     The patient was advised to call back or seek an in-person evaluation if the symptoms worsen or if the condition fails to improve as anticipated.    Jannifer Rodney, FNP

## 2023-05-05 ENCOUNTER — Ambulatory Visit (HOSPITAL_COMMUNITY)
Admission: RE | Admit: 2023-05-05 | Discharge: 2023-05-05 | Disposition: A | Discharge status: Home / Self Care | Payer: Medicaid Other | Source: Ambulatory Visit | Attending: Emergency Medicine | Admitting: Emergency Medicine

## 2023-05-05 ENCOUNTER — Encounter (HOSPITAL_COMMUNITY): Payer: Self-pay

## 2023-05-05 VITALS — BP 110/70 | HR 90 | Temp 98.2°F | Resp 18

## 2023-05-05 DIAGNOSIS — H66001 Acute suppurative otitis media without spontaneous rupture of ear drum, right ear: Secondary | ICD-10-CM

## 2023-05-05 DIAGNOSIS — J209 Acute bronchitis, unspecified: Secondary | ICD-10-CM

## 2023-05-05 MED ORDER — DOXYCYCLINE HYCLATE 100 MG PO CAPS: 100.0000 mg | ORAL_CAPSULE | Freq: Two times a day (BID) | ORAL | 0 refills | Status: DC

## 2023-05-05 MED ORDER — BENZONATATE 100 MG PO CAPS: 100.0000 mg | ORAL_CAPSULE | Freq: Three times a day (TID) | ORAL | 0 refills | Status: DC

## 2023-05-05 NOTE — ED Provider Notes (Incomplete)
[facility]    CSN: [id number] Arrival date & time: 05/05/23  1823      History   Chief Complaint Chief Complaint  Patient presents with  . Cough    Been sick for about a week spitting up green mucus feeling cold spikes in my chest coughing - Entered by patient    HPI Melanie Cisneros is a 32 y.o. female.   The history is provided by the patient.  Cough   Past Medical History:  Diagnosis Date  . Bacterial vaginosis   . BMI 45.0-49.9, adult (HCC)   . Chlamydia   . Dizziness   . Facial numbness   . Fullness in head   . Gonorrhea   . History of gastric bypass   . Hypokalemia   . Lightheadedness   . MS (multiple sclerosis) (HCC)   . Obesity   . Ovarian cyst   . Palpitations   . Psychosocial stressors   . Ptyalism   . Rubella non-immune status, antepartum   . UTI (lower urinary tract infection)     Patient Active Problem List   Diagnosis Date Noted  . Orthostatic lightheadedness 09/13/2022  . Hypokalemia 07/06/2022  . Anxious mood 07/06/2022  . Gonorrhea 11/09/2021  . Ovarian cyst 11/09/2021  . Bacterial vaginosis 05/13/2020  . BMI 45.0-49.9, adult (HCC) 06/06/2016  . Psychosocial stressors 02/25/2016  . 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 Rubella non-immune status, antepartum 01/27/2016    Past Surgical History:  Procedure Laterality Date  . GASTRIC BYPASS    . WISDOM TOOTH EXTRACTION      OB History     Gravida  1   Para  1   Term  1   Preterm  0   AB  0   Living  1      SAB  0   IAB  0   Ectopic  0   Multiple  0   Live Births  1            Home Medications    Prior to Admission medications   Medication Sig Start Date End Date Taking? Authorizing Provider  fluconazole (DIFLUCAN) 150 MG tablet Take 1 tablet (150 mg total) by mouth every three (3) days as needed. 04/16/23   Junie Spencer, FNP  fluconazole (DIFLUCAN) 150 MG tablet Take 1 tablet (150 mg total) by mouth every three (3) days as needed. 04/30/23   Junie Spencer, FNP  metroNIDAZOLE (FLAGYL) 500 MG tablet Take 1 tablet (500 mg total) by mouth 2 (two) times daily. 04/30/23   Junie Spencer, FNP  norgestimate-ethinyl estradiol (SPRINTEC 28) 0.25-35 MG-MCG tablet Take 1 tablet by mouth daily. 01/31/23   Celine Mans, MD    Family History Family History  Problem Relation Age of Onset  . Diabetes Father   . Hypertension Mother     Social History Social History   Tobacco Use  . Smoking status: Never  . Smokeless tobacco: Never  Vaping Use  . Vaping status: Never Used  Substance Use Topics  . Alcohol use: Not Currently  . Drug use: No     Allergies   Amoxicillin, Penicillins, and Pork allergy   Review of Systems Review of Systems  Respiratory:  Positive for cough.      Physical Exam Triage Vital Signs ED Triage Vitals  Encounter Vitals Group     BP 05/05/23 1843 110/70     Systolic BP Percentile --      Diastolic BP  Percentile --      Pulse Rate 05/05/23 1843 90     Resp 05/05/23 1843 18     Temp 05/05/23 1843 98.2 F (36.8 C)     Temp Source 05/05/23 1843 Oral     SpO2 05/05/23 1843 96 %     Weight --      Height --      Head Circumference --      Peak Flow --      Pain Score 05/05/23 1840 0     Pain Loc --      Pain Education --      Exclude from Growth Chart --    No data found.  Updated Vital Signs BP 110/70 (BP Location: Left Arm)   Pulse 90   Temp 98.2 F (36.8 C) (Oral)   Resp 18   LMP 04/18/2023 (Approximate)   SpO2 96%   Visual Acuity Right Eye Distance:   Left Eye Distance:   Bilateral Distance:    Right Eye Near:   Left Eye Near:    Bilateral Near:     Physical Exam   UC Treatments / Results  Labs (all labs ordered are listed, but only abnormal results are displayed) Labs Reviewed - No data to display  EKG   Radiology No results found.  Procedures Procedures (including critical care time)  Medications Ordered in UC Medications - No data to display  Initial  Impression / Assessment and Plan / UC Course  I have reviewed the triage vital signs and the nursing notes.  Pertinent labs & imaging results that were available during my care of the patient were reviewed by me and considered in my medical decision making (see chart for details).     *** Final Clinical Impressions(s) / UC Diagnoses   Final diagnoses:  None   Discharge Instructions   None    ED Prescriptions   None    PDMP not reviewed this encounter.

## 2023-05-05 NOTE — ED Triage Notes (Signed)
Pt c/o cough, headache, congestion, sob since Saturday.

## 2023-05-05 NOTE — Discharge Instructions (Addendum)
Mucinex D- same as 600mg  guaifenesin and pseudoedpedrine 30mg  Tessalon for cough Doxycycline  antibiotic Consider probiotics to prevent diarrhea with multiple antibiotics F/u with PCP for worsening symptoms

## 2023-05-09 DIAGNOSIS — F4321 Adjustment disorder with depressed mood: Secondary | ICD-10-CM | POA: Diagnosis not present

## 2023-05-15 ENCOUNTER — Ambulatory Visit: Payer: Medicaid Other | Admitting: Student

## 2023-05-18 ENCOUNTER — Ambulatory Visit: Payer: Medicaid Other | Admitting: Student

## 2023-05-18 VITALS — BP 110/66 | HR 86 | Wt 166.0 lb

## 2023-05-18 DIAGNOSIS — Z789 Other specified health status: Secondary | ICD-10-CM | POA: Diagnosis not present

## 2023-05-18 DIAGNOSIS — M6289 Other specified disorders of muscle: Secondary | ICD-10-CM

## 2023-05-18 DIAGNOSIS — R202 Paresthesia of skin: Secondary | ICD-10-CM | POA: Diagnosis not present

## 2023-05-18 LAB — POCT URINALYSIS DIPSTICK
Bilirubin, UA: NEGATIVE
Blood, UA: NEGATIVE
Glucose, UA: NEGATIVE
Ketones, UA: NEGATIVE
Leukocytes, UA: NEGATIVE
Nitrite, UA: NEGATIVE
Protein, UA: NEGATIVE
Spec Grav, UA: 1.015 (ref 1.010–1.025)
Urobilinogen, UA: 0.2 U/dL
pH, UA: 5 (ref 5.0–8.0)

## 2023-05-18 NOTE — Assessment & Plan Note (Addendum)
 Involving the pelvic region and right leg. Reassuringly, ambulating independently and normal neurologic exam. She may have pelvic floor dysfunction, and I cannot rule out pudendal neuralgia although this is outside of my expertise. I referred her for pelvic floor physical therapy for possible pelvic floor dysfunction/nerve irritation. Also must consider sciatica, foraminal stenosis. 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 No signs or symptoms concerning for cauda equina syndrome.  Discussed concerning symptoms and ER return precautions.

## 2023-05-18 NOTE — Patient Instructions (Signed)
 It was great seeing you today.  As we discussed, -I sent a referral to the pelvic floor physical therapist.  They will call you to schedule.    If you have any questions or concerns, please feel free to call the clinic.   Have a wonderful day,  Dr. Barabara Dama [facility] [phone]

## 2023-05-18 NOTE — Progress Notes (Signed)
    SUBJECTIVE:   CHIEF COMPLAINT / HPI:   Melanie Cisneros is a 33 year-old female here with jolts and a strange sensation in her right labia and thigh. Feels some numbness in right labia and in her buttock. Feels like she has a nerve pulling. She says she googled her symptoms and it shows she might have pudendal neuralgia.  She denies any saddle anesthesia, loss of bowel or bladder control, fevers or chills.  She wanted her urine checked today as well as she had a virtual visit for bacterial vaginosis, and she was prescribed a course of Flagyl .  Show to make sure she did not have a UTI.  She has a history of a SVD.  OBJECTIVE:   BP 110/66   Pulse 86   Wt 166 lb (75.3 kg)   LMP 04/18/2023 (Approximate)   SpO2 100%   BMI 27.62 kg/m   General: NAD, well appearing, normal gait and ambulates independently Cardiac: RRR Respiratory: normal WOB on RA. No wheezing or crackles on auscultation, good lung sounds throughout Neuro: A&O x 4.  Speech clear and fluent.  No focal neurological deficits. Extremities: Moving all 4 extremities equally   ASSESSMENT/PLAN:   Paresthesia Involving the pelvic region and right leg. Reassuringly, ambulating independently and normal neurologic exam. She may have pelvic floor dysfunction, and I cannot rule out pudendal neuralgia although this is outside of my expertise. I referred her for pelvic floor physical therapy for possible pelvic floor dysfunction/nerve irritation. Also must consider sciatica, foraminal stenosis. 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 No signs or symptoms concerning for cauda equina syndrome.  Discussed concerning symptoms and ER return precautions.   Point-of-care urinalysis within normal limits today.  Earnie Bechard, DO [facility]

## 2023-05-23 ENCOUNTER — Encounter: Payer: Self-pay | Admitting: Student

## 2023-05-23 DIAGNOSIS — F4321 Adjustment disorder with depressed mood: Secondary | ICD-10-CM | POA: Diagnosis not present

## 2023-05-23 NOTE — Telephone Encounter (Signed)
 Pt returns call to nurse line to schedule follow up visit.   She reports that this has been going on for a while now, however, has worsened in the last 3-4 weeks.   She denies facial drooping, speech difficulty or balance issues.   Scheduled for tomorrow morning with PCP.   Strict ED precautions discussed.   Chiquita JAYSON English, RN

## 2023-05-24 ENCOUNTER — Ambulatory Visit: Payer: Medicaid Other | Admitting: Student

## 2023-05-24 VITALS — BP 102/74 | HR 64 | Ht 65.0 in | Wt 159.0 lb

## 2023-05-24 DIAGNOSIS — G5621 Lesion of ulnar nerve, right upper limb: Secondary | ICD-10-CM | POA: Diagnosis not present

## 2023-05-24 DIAGNOSIS — R202 Paresthesia of skin: Secondary | ICD-10-CM | POA: Diagnosis not present

## 2023-05-24 DIAGNOSIS — G5620 Lesion of ulnar nerve, unspecified upper limb: Secondary | ICD-10-CM | POA: Insufficient documentation

## 2023-05-24 NOTE — Patient Instructions (Addendum)
 It was great seeing you today.  As we discussed, - See number below for pelvic floor therapy - I ordered an X-ray for your neck.  You may go to [address] ([facility]) at [employer] [facility].  This is a walk-in imaging center.   [facility] [address] [place], [place] [address] [phone]   If you have any questions or concerns, please feel free to call the clinic.   Have a wonderful day,  Dr. Barabara Dama [facility] [phone]

## 2023-05-24 NOTE — Assessment & Plan Note (Signed)
 Symptoms most consistent with ulnar neuropathy, although unclear of site of compression (wrist, elbow, cervical spine). Negative Tinel's sign. She did have positive Spurling's test, so therefore we will order a cervical spine x-ray to rule out cervical disc disease. If x-ray is negative, we will pursue EMG and nerve conduction study. She had metabolic labs in July 2024, B12 was elevated and TSH was within normal limits. No concern for alcohol related neuropathy, thoracic outlet syndrome, traumatic peripheral nerve lesion. Cannot take NSAIDs secondary to history of bariatric surgery. Will follow-up on imaging and pursue further workup as necessary.

## 2023-05-24 NOTE — Progress Notes (Signed)
    SUBJECTIVE:   CHIEF COMPLAINT / HPI: Melanie Cisneros is a 33 year-old female here for numbness in right ring and pinky finger of right hand, ongoing for months.  She denies any issues with reduced strength range of motion. Denies any inciting injury or trauma. Does not have any numbness in any of the other fingers of her right hand or left hand. Sometimes has a sore feeling in her upper arm. Denies any issues with weakness or dropping things  PERTINENT  PMH / PSH:  History of bariatric surgery Anxious mood  OBJECTIVE:   BP 102/74 (BP Location: Left Arm, Patient Position: Sitting)   Pulse 64   Ht 5' 5 (1.651 m)   Wt 159 lb (72.1 kg)   LMP 04/18/2023 (Approximate)   SpO2 98%   BMI 26.46 kg/m   General: NAD, well appearing Cardiac: RRR Neuro: A&O Respiratory: normal WOB on RA. No wheezing or crackles on auscultation, good lung sounds throughout Extremities: Moving all 4 extremities equally  MSK, right hand: No bruising, erythema, bony deformity on inspection. No tenderness to palpation. Good wrist flexion and extension ROM.  No notable atrophy of the thenar or hypothenar eminence, or atrophy of intrinsic muscles leading to clawhand. Negative Tinel's sign at the ulnar groove.  ASSESSMENT/PLAN:   Ulnar neuropathy Symptoms most consistent with ulnar neuropathy, although unclear of site of compression (wrist, elbow, cervical spine). Negative Tinel's sign. She did have positive Spurling's test, so therefore we will order a cervical spine x-ray to rule out cervical disc disease. If x-ray is negative, we will pursue EMG and nerve conduction study. She had metabolic labs in July 2024, B12 was elevated and TSH was within normal limits. No concern for alcohol related neuropathy, thoracic outlet syndrome, traumatic peripheral nerve lesion. Cannot take NSAIDs secondary to history of bariatric surgery. Will follow-up on imaging and pursue further workup as necessary.    Trelyn Vanderlinde, DO [facility]

## 2023-05-30 DIAGNOSIS — F4321 Adjustment disorder with depressed mood: Secondary | ICD-10-CM | POA: Diagnosis not present

## 2023-06-06 DIAGNOSIS — F4321 Adjustment disorder with depressed mood: Secondary | ICD-10-CM | POA: Diagnosis not present

## 2023-06-09 ENCOUNTER — Ambulatory Visit
Admission: RE | Admit: 2023-06-09 | Discharge: 2023-06-09 | Disposition: A | Discharge status: Home / Self Care | Payer: Medicaid Other | Source: Ambulatory Visit | Attending: Family Medicine | Admitting: Family Medicine

## 2023-06-09 DIAGNOSIS — R202 Paresthesia of skin: Secondary | ICD-10-CM

## 2023-06-13 ENCOUNTER — Ambulatory Visit
Admission: RE | Admit: 2023-06-13 | Discharge: 2023-06-13 | Disposition: A | Discharge status: Home / Self Care | Payer: Medicaid Other | Source: Ambulatory Visit | Attending: Family Medicine | Admitting: Family Medicine

## 2023-06-13 DIAGNOSIS — R202 Paresthesia of skin: Secondary | ICD-10-CM | POA: Diagnosis not present

## 2023-06-13 DIAGNOSIS — F4321 Adjustment disorder with depressed mood: Secondary | ICD-10-CM | POA: Diagnosis not present

## 2023-06-16 ENCOUNTER — Encounter: Payer: Self-pay | Admitting: Student

## 2023-06-20 DIAGNOSIS — F4321 Adjustment disorder with depressed mood: Secondary | ICD-10-CM | POA: Diagnosis not present

## 2023-06-23 ENCOUNTER — Ambulatory Visit: Payer: Medicaid Other | Admitting: Student

## 2023-06-23 NOTE — Progress Notes (Deleted)
    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  ***  ASSESSMENT/PLAN:   No problem-specific Assessment & Plan notes found for this encounter.     JINNY Con Gasman, MD [facility]

## 2023-06-27 ENCOUNTER — Ambulatory Visit: Payer: Medicaid Other | Admitting: Student

## 2023-07-04 ENCOUNTER — Ambulatory Visit (INDEPENDENT_AMBULATORY_CARE_PROVIDER_SITE_OTHER): Payer: Medicaid Other | Admitting: Family Medicine

## 2023-07-04 ENCOUNTER — Encounter: Payer: Self-pay | Admitting: Family Medicine

## 2023-07-04 VITALS — BP 109/80 | HR 62 | Ht 65.0 in | Wt 157.0 lb

## 2023-07-04 DIAGNOSIS — G1220 Motor neuron disease, unspecified: Secondary | ICD-10-CM | POA: Diagnosis not present

## 2023-07-04 DIAGNOSIS — G959 Disease of spinal cord, unspecified: Secondary | ICD-10-CM | POA: Diagnosis not present

## 2023-07-04 NOTE — Patient Instructions (Addendum)
It was great to see you today! Thank you for choosing [facility] for your primary care.  Today we addressed:  Myelopathy/neuropathy Your MRIs are scheduled at 2 pm on February 28th and we will call you back with results. The Neurologist office will call you to schedule an appointment now that I have placed a referral.  I know that this can be scary, but we will work through this together and get you some answers!  If you had a referral placed, they will call you to set up an appointment. Please give [place] a call if you don't hear back in the next 2 weeks.  You should return to our clinic if your symptoms get worse or go to the ED if you have severe weakness, are unable to walk, or you have severe shortness of breath.  Thank you for coming to see [place] at [facility] and for the opportunity to care for you! Viviano Bir, MD 07/04/2023, 10:22 AM

## 2023-07-04 NOTE — Progress Notes (Unsigned)
SUBJECTIVE:   CHIEF COMPLAINT / HPI:  Melanie Cisneros is a 33 y.o. female with a pertinent past medical history of ulnar neuropathy presenting to the clinic for progressively worsening paraesthesias.  Parasthesias For the past few months (~3) has had progressive tingling and numbness in R hand and R leg. Initially felt some nerve twinges in perineal area which gradually progressed to entire R leg. Progressive onset, worsening. Also has numbness in 5th and 4th fingers of R hand. Last night she noted greater numbness and "vibration" in lower back and neck, which concerned her her.  Also noticed some "muscle tightening" of L arm at the same time.  Also felt simultaneous weakness of R leg. She took acetaminophen and felt a bit of relief, otherwise no medications for this issue. No specific association with time of day (present in both morning and evening). Has not pain anywhere with these symptoms, just numbness and tingling. Denies falls, tripping.  Denies foot drop or drag. Denies vision changes, headaches.  Denies dizziness, vertigo. No history of illness prior onset of symptoms.   PERTINENT PMH / PSH: Aunt and 2 cousins had thyroid issues and thyroids were removed for thyroid nodules. No other autoimmune history noted.   OBJECTIVE:   BP 109/80   Pulse 62   Ht 5\' 5"  (1.651 m)   Wt 157 lb (71.2 kg)   SpO2 100%   BMI 26.13 kg/m   General: Age-appropriate, resting comfortably in chair, NAD, alert and at baseline. Cardiovascular: Regular rate and rhythm. Normal S1/S2. No murmurs, rubs, or gallops appreciated. 2+ radial pulses. Pulmonary: Clear bilaterally to ascultation. No wheezes, crackles, or rhonchi. No increased WOB, no accessory muscle usage on room air. Skin: Warm and dry.  No rashes grossly. Extremities: No peripheral edema bilaterally. Capillary refill <2 seconds.  Neurological examination: MS:  Awake, alert, interactive. Normal eye contact, answered the questions  appropriately, speech was fluent, normal comprehension.  Attention and concentration were normal.  Alert to self, place, month, and situation. Cranial nerves: CN II:  PERRLA.  Blinks normally to threat bilaterally. CNs III, IV, VI:  Full extraocular eye movement without nystagmus.  No ptosis or diplopia. CN V:  Facial sensation is normal, no weakness of masticatory muscles.  CN VII:  No facial weakness or asymmetry.  CN VIII:  Auditory acuity grossly normal. CNs XI/X:  Palate elevation symmetric. CN XI:  Normal sternocleidomastoid and trapezius strength. CN XII: Tongue is midline without atrophy or fasciculations. MOTOR:  Strength 5/5 RUE, 5/5 LUE, 5/5 RLE, 5/5 LLE.  No clonus noted. COORDINATION:  Intact finger-to-nose, no tremor.  No difficulty with balance. SENSATION:  Reduced and altered sensation over R hand 4th and 5th digits as well as diffusely over RLE, notable paraesthesias with palpation.  Romberg negative. GAIT:  Normal walk and run.  Tandem gait was normal.  Was able to perform toe walking and heel walking without difficulty.    ASSESSMENT/PLAN:   Assessment & Plan Myelopathy [facility]) Would describe as myelopathy versus neuropathy with paraesthesias and anaesthesia in R-sided distribution but of BOTH upper and lower extremities, as well as questionable intermittent weakness of RLE.  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  Neurological exam unremarkable except altered sensation in ulnar distribution of R hand and generalized across RLE.  Strength is notably equal bilaterally, which is reassuring.  Low concern for stroke given young age and lack of risk factors.  Given constellation of symptoms, multiple sclerosis  is on the differential and further imaging should be pursued to evaluate for brain and spinal cord lesions.  Myelopathy/neuropathy would be difficult to explain with sole nerve impingement as is is present in  distribution of multiple separate spinal nerve roots.  Despite progressive onset of symptoms, they are not ascending the spinal cord and there is no known illness at onset, making Guillain-Barre unlikely. -MRI w/ and w/out of cervical spine, lumbar spine, and brain -Urgent referral to Neurology -Offered patient reassurance -Strict ED precautions if worsening weakness, difficulty breathing, severe headache, or inability to speak -Follow up after MRI imaging or sooner if symptoms worsening  Melanie Scaffidi Sharion Dove, MD [facility]

## 2023-07-05 DIAGNOSIS — F4321 Adjustment disorder with depressed mood: Secondary | ICD-10-CM | POA: Diagnosis not present

## 2023-07-07 ENCOUNTER — Encounter: Payer: Self-pay | Admitting: Neurology

## 2023-07-14 ENCOUNTER — Telehealth: Payer: Self-pay | Admitting: Family Medicine

## 2023-07-14 ENCOUNTER — Other Ambulatory Visit: Payer: Self-pay | Admitting: Family Medicine

## 2023-07-14 ENCOUNTER — Ambulatory Visit (HOSPITAL_COMMUNITY)
Admission: RE | Admit: 2023-07-14 | Discharge: 2023-07-14 | Disposition: A | Discharge status: Home / Self Care | Payer: Medicaid Other | Source: Ambulatory Visit | Attending: Family Medicine | Admitting: Family Medicine

## 2023-07-14 DIAGNOSIS — G1220 Motor neuron disease, unspecified: Secondary | ICD-10-CM

## 2023-07-14 DIAGNOSIS — G959 Disease of spinal cord, unspecified: Secondary | ICD-10-CM

## 2023-07-14 DIAGNOSIS — M5416 Radiculopathy, lumbar region: Secondary | ICD-10-CM | POA: Diagnosis not present

## 2023-07-14 DIAGNOSIS — M4712 Other spondylosis with myelopathy, cervical region: Secondary | ICD-10-CM | POA: Diagnosis not present

## 2023-07-14 DIAGNOSIS — M5001 Cervical disc disorder with myelopathy,  high cervical region: Secondary | ICD-10-CM | POA: Diagnosis not present

## 2023-07-14 NOTE — Telephone Encounter (Signed)
 Received page to [facility] after hours line to call [facility] radiology tech.  [phone].  Spoke with radiology tech who wanted to communicate impression #1 from MRI cervical spine without contrast per read by Dr. Renette Butters, this is copied below.  1. T2 hyperintense signal abnormality within the dorsal aspect of the upper cervical spinal cord (at the C2 level) and extending to the cervicomedullary junction, spanning 2.6 cm in craniocaudal dimension. Differential considerations include inflammatory demyelination (MS, NMO), subacute combined degeneration, copper deficiency, sequela viral infection and syphilis/tabes dorsalis, among others. Post-contrast MR imaging of the cervical spine is recommended for further evaluation. An MRI of the thoracic spine (with and without contrast) should also be considered.   MR lumbar spine and MRI brain were also read today.  Seems like primary reason for communication is that additional imaging is recommended as noted above.  I clarified whether any of these results would require any urgent ED/inpatient evaluation, and radiology tech stated they did not think so.  Will forward to ordering physician Dr. Sharion Dove to review full results and determine next steps.   Vonna Drafts, MD   CC: PCP Dr. Melissa Noon

## 2023-07-15 ENCOUNTER — Telehealth: Payer: Self-pay | Admitting: Neurology

## 2023-07-15 ENCOUNTER — Inpatient Hospital Stay: Admit: 2023-07-15 | Admitting: Student

## 2023-07-15 ENCOUNTER — Encounter (HOSPITAL_COMMUNITY): Payer: Self-pay

## 2023-07-15 ENCOUNTER — Inpatient Hospital Stay (HOSPITAL_COMMUNITY)
Admission: RE | Admit: 2023-07-15 | Discharge: 2023-07-18 | DRG: 093 | Disposition: A | Discharge status: Home / Self Care | Attending: Family Medicine | Admitting: Family Medicine

## 2023-07-15 ENCOUNTER — Telehealth: Payer: Self-pay | Admitting: Family Medicine

## 2023-07-15 DIAGNOSIS — G959 Disease of spinal cord, unspecified: Principal | ICD-10-CM | POA: Diagnosis present

## 2023-07-15 DIAGNOSIS — D509 Iron deficiency anemia, unspecified: Secondary | ICD-10-CM | POA: Diagnosis not present

## 2023-07-15 DIAGNOSIS — G373 Acute transverse myelitis in demyelinating disease of central nervous system: Secondary | ICD-10-CM

## 2023-07-15 DIAGNOSIS — D649 Anemia, unspecified: Secondary | ICD-10-CM | POA: Insufficient documentation

## 2023-07-15 DIAGNOSIS — G379 Demyelinating disease of central nervous system, unspecified: Principal | ICD-10-CM

## 2023-07-15 DIAGNOSIS — K5900 Constipation, unspecified: Secondary | ICD-10-CM | POA: Insufficient documentation

## 2023-07-15 DIAGNOSIS — Z91014 Allergy to mammalian meats: Secondary | ICD-10-CM | POA: Diagnosis not present

## 2023-07-15 DIAGNOSIS — E876 Hypokalemia: Secondary | ICD-10-CM | POA: Diagnosis present

## 2023-07-15 DIAGNOSIS — Z9884 Bariatric surgery status: Secondary | ICD-10-CM | POA: Diagnosis not present

## 2023-07-15 DIAGNOSIS — R202 Paresthesia of skin: Principal | ICD-10-CM | POA: Diagnosis present

## 2023-07-15 DIAGNOSIS — R531 Weakness: Secondary | ICD-10-CM | POA: Diagnosis present

## 2023-07-15 DIAGNOSIS — Z833 Family history of diabetes mellitus: Secondary | ICD-10-CM | POA: Diagnosis not present

## 2023-07-15 DIAGNOSIS — N898 Other specified noninflammatory disorders of vagina: Secondary | ICD-10-CM | POA: Insufficient documentation

## 2023-07-15 DIAGNOSIS — R200 Anesthesia of skin: Secondary | ICD-10-CM | POA: Diagnosis present

## 2023-07-15 DIAGNOSIS — Z87440 Personal history of urinary (tract) infections: Secondary | ICD-10-CM | POA: Diagnosis not present

## 2023-07-15 DIAGNOSIS — Z880 Allergy status to penicillin: Secondary | ICD-10-CM

## 2023-07-15 DIAGNOSIS — D508 Other iron deficiency anemias: Secondary | ICD-10-CM | POA: Diagnosis not present

## 2023-07-15 DIAGNOSIS — Z79899 Other long term (current) drug therapy: Secondary | ICD-10-CM

## 2023-07-15 DIAGNOSIS — Z8249 Family history of ischemic heart disease and other diseases of the circulatory system: Secondary | ICD-10-CM | POA: Diagnosis not present

## 2023-07-15 DIAGNOSIS — G629 Polyneuropathy, unspecified: Secondary | ICD-10-CM | POA: Diagnosis present

## 2023-07-15 LAB — CBC
HCT: 34.2 % — ABNORMAL LOW (ref 36.0–46.0)
Hemoglobin: 11.6 g/dL — ABNORMAL LOW (ref 12.0–15.0)
MCH: 28.7 pg (ref 26.0–34.0)
MCHC: 33.9 g/dL (ref 30.0–36.0)
MCV: 84.7 fL (ref 80.0–100.0)
Platelets: 321 10*3/uL (ref 150–400)
RBC: 4.04 MIL/uL (ref 3.87–5.11)
RDW: 13.6 % (ref 11.5–15.5)
WBC: 7 10*3/uL (ref 4.0–10.5)
nRBC: 0 % (ref 0.0–0.2)

## 2023-07-15 LAB — FOLATE: Folate: 27.8 ng/mL (ref >5.9)

## 2023-07-15 LAB — HIV ANTIBODY (ROUTINE TESTING W REFLEX): HIV Screen 4th Generation wRfx: NONREACTIVE

## 2023-07-15 LAB — VITAMIN B12: Vitamin B-12: 442 pg/mL (ref 180–914)

## 2023-07-15 MED ORDER — CYANOCOBALAMIN 1000 MCG/ML IJ SOLN
1000.0000 ug | Freq: Every day | INTRAMUSCULAR | Status: DC
  Administered 2023-07-15: 1000 ug via INTRAMUSCULAR
  Filled 2023-07-15: qty 1

## 2023-07-15 MED ORDER — ACETAMINOPHEN 325 MG PO TABS
650.0000 mg | ORAL_TABLET | Freq: Four times a day (QID) | ORAL | Status: DC | PRN
  Administered 2023-07-15 – 2023-07-18 (×2): 650 mg via ORAL
  Filled 2023-07-15 (×2): qty 2

## 2023-07-15 MED ORDER — ENOXAPARIN SODIUM 40 MG/0.4ML IJ SOSY
40.0000 mg | PREFILLED_SYRINGE | INTRAMUSCULAR | Status: DC
  Filled 2023-07-15: qty 0.4

## 2023-07-15 MED ORDER — ADULT MULTIVITAMIN W/MINERALS CH
1.0000 | ORAL_TABLET | Freq: Every day | ORAL | Status: DC
  Administered 2023-07-15 – 2023-07-18 (×4): 1 via ORAL
  Filled 2023-07-15 (×4): qty 1

## 2023-07-15 NOTE — Progress Notes (Signed)
Patient arrived on unit.

## 2023-07-15 NOTE — Progress Notes (Signed)
 Patient refused Lovenox shot. MD notified. And wants patient to frequently ambulate in room. Assessed patients balance, she is steady on her feet. Patient deemed appropriate to be independent without bed alarm.

## 2023-07-15 NOTE — Telephone Encounter (Signed)
 Reviewed MRI cervical spine findings and critical result called by lab tech 07/14/2023.  Called on-call Neurologist Dr. Iver Nestle, appreciate assistance. Dr. Iver Nestle noted findings in cervical spine classic for B12 deficiency, but we both noted elevated B12 levels July 2024 and discussed direct admission versus outpatient follow up, preferring inpatient admission for repeat MRI brain and cervical spine w/ contrast this time, as well as additional MRI thoracic spine w/ and w/out contrast.  Upon calling patient, established that she has history of gastric bypass and she initially stated she was not taking B12 supplements since July when levels were elevated.  Patient also reported no further weakness episodes except single one day prior to clinic visit with me on 2/18, continuing paraesthesias and numbness or RLE.  She also informed me she had declined contrast with MRI intentionally due to concerns about side effects, which we discussed at length.  Messaged Dr. Iver Nestle again to discuss, and she favored IM B12 injections 1000 mcg x7 days outpatient if patient able to start today given classic imaging findings and patient stability.  Called patient back, but she informed me that she HAD consistently been taking bariatric surgery vitamins with 500 mcg B12 BID for total 1000 mcg daily.  After shared decision making with patient, decided to move forward with original plan for direct admission.  Discussed plan with attending Dr. Lum Babe and in agreement with our decision-making.  Patient awaiting call back for direct admission pending bed availability, aware this could take several hours.  Plan: -Call bed placement for direct admission -MRI brain w/ contrast -MRI cervical spine w/ contrast -MRI thoracic spine w/ and w/out contrast -Neuro consult, possible LP  Amariya Liskey, MD 07/15/23, 9:32 AM

## 2023-07-15 NOTE — Telephone Encounter (Signed)
 Discussed with family medicine resident on-call (Dr. Kirkland Hun) regarding this patient  She has had approximately 1 month of symptoms, right lower extremity primarily paresthesias but also some weakness at one point.  MRI brain, cervical spine, lumbar spine without contrast reviewed as below Mainly she has a lesion in the upper cervical spine, and I agree with the differential noted by radiology  I do think this is less likely to be B12 deficiency given last B12 level was quite elevated and it would be unusual to develop a sudden deficiency  I would recommend admission for expedited workup -MRI brain with contrast -MRI cervical spine with contrast -MRI thoracic spine with and without contrast  -Myelopathy labs: zinc, copper, B12, MMA, folate, Vitamin E, HIV, RPR, NMO, MOG, -Lumbar puncture: cell counts tubes 1 and 4, protein, glucose, oligoclonal bands, IgG index, VRDL and opening pressure if possible  -Full neurological consultation with additional recommendations pending full evaluation   These are curbside recommendations based upon the information readily available in the chart on brief review as well as history and examination information provided to me by requesting provider and do not replace a full detailed consult  Lab Results  Component Value Date   VITAMINB12 5,511 (H) 12/09/2022    HIV and RPR negative in July 2024  MRI brain 1. Intermittently motion degraded examination. 2. T2 hyperintense signal abnormality within the dorsal aspect of the upper cervical spinal cord (at the C2 level) and extending to the cervicomedullary junction, spanning 2.6 cm in craniocaudal dimension. Differential considerations include inflammatory demyelination (MS, NMO), subacute combined degeneration, copper deficiency and sequela of viral infection, among others. Post-contrast MR imaging of the cervical spine is recommended for further evaluation. 3. Mildly low-lying cerebellar  tonsils. 4. Otherwise unremarkable MRI appearance of the brain. 5. Mild platybasia. 6. Abnormal T1 hypointense marrow signal within visible portions of the cervical spine. While this finding can reflect a marrow infiltrative process, the most common causes include chronic anemia, smoking and obesity.  MRI c-spine 1. T2 hyperintense signal abnormality within the dorsal aspect of the upper cervical spinal cord (at the C2 level) and extending to the cervicomedullary junction, spanning 2.6 cm in craniocaudal dimension. Differential considerations include inflammatory demyelination (MS, NMO), subacute combined degeneration, copper deficiency, sequela viral infection and syphilis/tabes dorsalis, among others. Post-contrast MR imaging of the cervical spine is recommended for further evaluation. An MRI of the thoracic spine (with and without contrast) should also be considered. 2. Cervical spondylosis as outlined within the body of the report. No more than mild relative spinal canal narrowing. No significant foraminal stenosis. Disc degeneration is greatest at C6-C7 (moderate at this level).  MRI lumbar spine 1. Transitional lumbosacral anatomy as described. 2. No significant disc degeneration, disc herniation, spinal canal stenosis or neural foraminal narrowing within the lumbar spine.

## 2023-07-15 NOTE — Consult Note (Signed)
 NEUROLOGY CONSULT NOTE   Date of service: July 15, 2023 Patient Name: Melanie Cisneros MRN:  [id number] DOB:  12-08-1990 Chief Complaint: "Lower extremity paresthesia" Requesting Provider: Doreene Eland, MD  History of Present Illness  Melanie Cisneros is a 33 y.o. female with hx of ulnar neuropathy who initially presented to family medicine clinic due to worsening presenting with 2 month of right lower extremity paresthesias and weakness. For approximately 3 month she has had progressive tingling and numbness in the right had and leg. Her leg is always weak. Intermittently, gets sensation of vibration in her R leg and in her back. Initially she noted "nerve twinges" in the perineal area which progress to entire right leg over the last 2 months. She has also noted numbness in the 4th and 5th fingers of her right hand.  She endorses being sick a bad cold a couple weeks prior to onset of her symptoms. She feels like 1 day she just woke up a couple months ago with numbness/paresethesias and weakness.  She endorses hx of gastric bypass. Was on B!2 supplement and B12 levels were significantly elevated and so was told in the past to stop taking B12. Has still been taking a multivitamin with some B12 in it.   ROS  Comprehensive ROS performed and pertinent positives documented in HPI   Past History   Past Medical History:  Diagnosis Date   Bacterial vaginosis    BMI 45.0-49.9, adult (HCC)    Chlamydia    Dizziness    Facial numbness    Fullness in head    Gonorrhea    History of gastric bypass    Hypokalemia    Lightheadedness    Obesity    Ovarian cyst    Palpitations    Psychosocial stressors    Ptyalism    Rubella non-immune status, antepartum    UTI (lower urinary tract infection)     Past Surgical History:  Procedure Laterality Date   GASTRIC BYPASS     WISDOM TOOTH EXTRACTION      Family History: Family History  Problem Relation Age of Onset   Diabetes Father     Hypertension Mother     Social History  reports that she has never smoked. She has never used smokeless tobacco. She reports that she does not currently use alcohol. She reports that she does not use drugs.  Allergies  Allergen Reactions   Amoxicillin Anaphylaxis, Hives and Other (See Comments)    Has patient had a PCN reaction causing immediate rash, facial/tongue/throat swelling, SOB or lightheadedness with hypotension: Yes Has patient had a PCN reaction causing severe rash involving mucus membranes or skin necrosis: No Has patient had a PCN reaction that required hospitalization No Has patient had a PCN reaction occurring within the last 10 years: No If all of the above answers are "NO", then may proceed with Cephalosporin use.   Penicillins Anaphylaxis, Hives and Other (See Comments)    Has patient had a PCN reaction causing immediate rash, facial/tongue/throat swelling, SOB or lightheadedness with hypotension: Yes Has patient had a PCN reaction causing severe rash involving mucus membranes or skin necrosis: No Has patient had a PCN reaction that required hospitalization No Has patient had a PCN reaction occurring within the last 10 years: No If all of the above answers are "NO", then may proceed with Cephalosporin use.   Pork Allergy Other (See Comments)    Pt has tolerated multiple doses of heparin    Medications  Current Facility-Administered Medications:    cyanocobalamin (VITAMIN B12) injection 1,000 mcg, 1,000 mcg, Intramuscular, Daily, Shitarev, Dimitry, MD   multivitamin with minerals tablet 1 tablet, 1 tablet, Oral, Daily, Shitarev, Dimitry, MD  Vitals   Vitals:   07/20/23 1455  BP: (!) 110/57  Pulse: 84  Resp: 18  Temp: 98.2 F (36.8 C)  TempSrc: Oral  SpO2: 100%    There is no height or weight on file to calculate BMI.  Physical Exam   General: Laying comfortably in bed; in no acute distress.  HENT: Normal oropharynx and mucosa. Normal external  appearance of ears and nose.  Neck: Supple, no pain or tenderness  CV: No JVD. No peripheral edema.  Pulmonary: Symmetric Chest rise. Normal respiratory effort.  Abdomen: Soft to touch, non-tender.  Ext: No cyanosis, edema, or deformity  Skin: No rash. Normal palpation of skin.   Musculoskeletal: Normal digits and nails by inspection. No clubbing.   Neurologic Examination  Mental status/Cognition: Alert, oriented to self, place, month and year, good attention.  Speech/language: Fluent, comprehension intact, object naming intact, repetition intact.  Cranial nerves:   CN II Pupils equal and reactive to light, no VF deficits    CN III,IV,VI EOM intact, no gaze preference or deviation, no nystagmus    CN V normal sensation in V1, V2, and V3 segments bilaterally    CN VII no asymmetry, no nasolabial fold flattening    CN VIII normal hearing to speech    CN IX & X normal palatal elevation, no uvular deviation    CN XI 5/5 head turn and 5/5 shoulder shrug bilaterally    CN XII midline tongue protrusion    Motor:  Muscle bulk: normal, tone normal, pronator drift none tremor none Mvmt Root Nerve  Muscle Right Left Comments  SA C5/6 Ax Deltoid 5 5   EF C5/6 Mc Biceps 5 5   EE C6/7/8 Rad Triceps 5 5   WF C6/7 Med FCR     WE C7/8 PIN ECU     F Ab C8/T1 U ADM/FDI 5 5   HF L1/2/3 Fem Illopsoas 4+ 4+   KE L2/3/4 Fem Quad 5 5   DF L4/5 D Peron Tib Ant 5 5   PF S1/2 Tibial Grc/Sol 5 5    Reflexes:  Right Left Comments  Pectoralis      Biceps (C5/6) 1 1   Brachioradialis (C5/6) 1 1    Triceps (C6/7) 1 1    Patellar (L3/4) 2 2    Achilles (S1) 1 1    Hoffman      Plantar  withdraws   Jaw jerk    Sensation:  Light touch Slightly decreased in RUE and RLE to touch   Pin prick    Temperature    Vibration Decreased in BL lower extremities. Intact in BL uppers.  Proprioception    Coordination/Complex Motor:  - Finger to Nose intact BL - Heel to shin intact BL - Rapid alternating  movement are normal. - Gait: deferred.  Labs/Imaging/Neurodiagnostic studies   CBC:  Recent Labs  Lab 07/20/23 1650  WBC 7.0  HGB 11.6*  HCT 34.2*  MCV 84.7  PLT 321   Basic Metabolic Panel:  Lab Results  Component Value Date   NA 141 12/09/2022   K 3.8 12/09/2022   CO2 26 12/09/2022   GLUCOSE 73 12/09/2022   BUN 6 12/09/2022   CREATININE 0.70 12/09/2022   CALCIUM 9.4 12/09/2022   GFRNONAA >60 12/09/2022  GFRAA >60 01/12/2019   Lipid Panel:  Lab Results  Component Value Date   LDLCALC 73 01/31/2023   HgbA1c: No results found for: "HGBA1C" Urine Drug Screen: No results found for: "LABOPIA", "COCAINSCRNUR", "LABBENZ", "AMPHETMU", "THCU", "LABBARB"  Alcohol Level No results found for: "ETH" INR No results found for: "INR" APTT No results found for: "APTT" AED levels: No results found for: "PHENYTOIN", "ZONISAMIDE", "LAMOTRIGINE", "LEVETIRACETA"  MRI Brain(Personally reviewed): 1. Intermittently motion degraded examination. 2. T2 hyperintense signal abnormality within the dorsal aspect of the upper cervical spinal cord (at the C2 level) and extending to the cervicomedullary junction, spanning 2.6 cm in craniocaudal dimension. Differential considerations include inflammatory demyelination (MS, NMO), subacute combined degeneration, copper deficiency and sequela of viral infection, among others. Post-contrast MR imaging of the cervical spine is recommended for further evaluation. 3. Mildly low-lying cerebellar tonsils. 4. Otherwise unremarkable MRI appearance of the brain. 5. Mild platybasia. 6. Abnormal T1 hypointense marrow signal within visible portions of the cervical spine. While this finding can reflect a marrow infiltrative process, the most common causes include chronic anemia, smoking and obesity.   MRI c-spine 1. T2 hyperintense signal abnormality within the dorsal aspect of the upper cervical spinal cord (at the C2 level) and extending to the cervicomedullary  junction, spanning 2.6 cm in craniocaudal dimension. Differential considerations include inflammatory demyelination (MS, NMO), subacute combined degeneration, copper deficiency, sequela viral infection and syphilis/tabes dorsalis, among others. Post-contrast MR imaging of the cervical spine is recommended for further evaluation. An MRI of the thoracic spine (with and without contrast) should also be considered.  2. Cervical spondylosis as outlined within the body of the report. No more than mild relative spinal canal narrowing. No significant foraminal stenosis. Disc degeneration is greatest at C6-C7 (moderate at this level).   MRI lumbar spine 1. Transitional lumbosacral anatomy as described. 2. No significant disc degeneration, disc herniation, spinal canal stenosis or neural foraminal narrowing within the lumbar spine.  ASSESSMENT   Melanie Cisneros is a 33 y.o. female with a past medical history of neuropathy presenting with worsening paresthesias in her right lower extremity and back with some BL lower ext weakness. She endorses a viral prodrome a couple months ago and feels like she woke up one day with these symptoms. MRI positive for a hyperintense signal abnormality with in the dorsal aspect of the upper cervical spinal cord. Neuro exam with decreased sensation in RUE and RLE. Reduced vibratory sensation in BL lower extremities.  Differential is broad and includes potential autoimmune process including MS, NMO, MOGAD, Neurosarcoidosis, idiopathic transverse myelitis. Infectious including Syphilis, Lyme, HIV, HSV, CMV, EBV viral myelitis, Copper deficiiency myelopathy or zinc excess myelopathy, subacute combined degeneration from B12 deficiency.  RECOMMENDATIONS  -MRI brain with contrast -MRI cervical spine with contrast -MRI thoracic spine with and without contrast  -Myelopathy labs: zinc, copper, B12, MMA, folate, Vitamin E, HIV, RPR, NMO, MOG, ACE. -Lumbar puncture: cell counts and  differential in tube 1 and 4, protein, glucose, oligoclonal bands, IgG index, VRDL, meningitis/encephalitis panel, EBV, CMV and opening pressure if possible. ______________________________________________________________________  Plan discussed with patient and her aunts at the bedside.  Signed, Elmer Picker, NP [facility]   NEUROHOSPITALIST ADDENDUM Performed a face to face diagnostic evaluation.   I have reviewed the contents of history and physical exam as documented by PA/ARNP/Resident and agree with above documentation.  I have discussed and formulated the above plan as documented. Edits to the note have been made as needed.  Erick Blinks,  MD [facility] [phone]   If 7pm to 7am, please call on call as listed on AMION.

## 2023-07-15 NOTE — Assessment & Plan Note (Addendum)
 Broad differential as above and will await labs and imaging to elucidate etiology. -Admit to FMTS, Med-Surg, attending Dr. Lum Babe -Neurology routine consult, appreciate recommendations -Vitamin B12 injections 1000 mcg x7 days -MRI imaging: Brain w/ contrast, cervical spine w/ contrast, thoracic spine w/ and w/out contrast -Follow up MMA, B12, folate, zinc, vitamin E, copper, RPR, HIV, CBC

## 2023-07-15 NOTE — H&P (Cosign Needed Addendum)
 Hospital Admission History and Physical Service Pager: [phone]  Patient name: Melanie Cisneros Medical record number: [id number] Date of Birth: [birth year] Age: 33 y.o. Gender: female  Primary Care Provider: Darral Dash, DO Consultants: Neurology Code Status: Full Code Preferred Emergency Contact: Lamar Sprinkles, mother Contact Information     Name Relation Home Work Mobile   Setter,Betty Mother   [phone]      Other Contacts   None on File    Chief Complaint: Paraesthesias  Assessment and Plan: Melanie Cisneros is a 33 y.o. female with PMH of gastric bypass and R ulnar neuropathy direct admitted from home after MRI w/out contrast showed concern for unclear cervical pathology in setting of progressive paraesthesias and numbness in both RUE and RLE as well as intermittent RLE weakness.  Differential includes subacute combined degeneration, tabes dorsalis, demyelination disorder (i.e. MS), or other nutrient deficiency (copper, zinc, vitamin E, folate).  Given the patient's history of gastric bypass, subacute combined degeneration remains a top consideration.  However, it appears she has been taking 1000 mcg of vitamin B12 daily for at least 2 months, despite discontinuing the medicine for a few months prior to that.  Will follow-up B12 and MMA, as well as administer 7 day course of B12 injections per Neuro conversation prior to admission.  Romberg was notably negative, but altered sensation once again noted on neurological exam in RUE and RLE.  Otherwise, other etiologies remain possible and for the lab work as well as imaging should help elucidate nutritional deficits, infectious etiology, and structural pathology.  Will follow neurology lead further workup. Assessment & Plan Paresthesia Broad differential as above and will await labs and imaging to elucidate etiology. -Admit to FMTS, Med-Surg, attending Dr. Lum Babe -Neurology routine consult, appreciate recommendations -Vitamin B12  injections 1000 mcg x7 days -MRI imaging: Brain w/ contrast, cervical spine w/ contrast, thoracic spine w/ and w/out contrast -Follow up MMA, B12, folate, zinc, vitamin E, copper, RPR, HIV, CBC  Chronic and Stable Problems: History of gastric bypass: Continue daily multivitamin, consider additional supplementation as needed  FEN/GI: Regular diet VTE Prophylaxis: Patient declines Lovenox, SCDs ordered and she is ambulatory  Disposition: Med-Surg  History of Present Illness:  Melanie Cisneros is a 33 y.o. female presenting with progressive paraesthesias and numbness in both RUE and RLE as well as intermittent RLE weakness.  Patient seen in clinic 2/18 for progressive tingling and numbness in R hand and R leg.  Patient described this symptom onset over approximately 3 months and progressively worsening.  Noted altered sensation intermittently in R lower back, RLE, and R 4th and 5th digits in ulnar distribution.  Obtained MRI brain, cervical, and lumbar spine on 2/28, initially ordered as with and without contrast, however patient declined contrast upon presentation for imaging due to concerns for side effects.  Has no documented history of allergy to contrast.  Imaging revealed evidence of signal normality within upper cervical column with broad differential.  After calling on-call neurologist Dr. Iver Nestle (see telephone note earlier 3/1) and with shared decision making with patient, opted to directly admit her from home for follow-up MRI and labs as well as vitamin B12 supplementation.  On evaluation, patient reports numbness and weakness in R leg are currently intermittent.  Numbness occurs throughout the day for several minutes at a time.  Last episode of weakness of right leg occurred Tuesday 2/22, prior to that Monday 2/17.  Has sensation of constant "vibration and numbness" in right lower back.  Currently taking  bariatric vitamins that include 500 mcg B12 BID and has been doing so for at least 2  months now.  Was off B12 supplementation for ~4-6 months.  Denies falls, dragging of feet.   Review Of Systems: Per HPI with the following additions:   Pertinent Past Medical History: R ulnar neuropathy  Remainder reviewed in history tab.   Pertinent Past Surgical History: Gastric sleeve 01/2022 Revision to gastric bypass 02/2022  Remainder reviewed in history tab.   Pertinent Social History: Tobacco use: No Alcohol use: None Other Substance use: Denies Lives with Mother  Pertinent Family History: None, no autoimmune history per patient and mother.  Remainder reviewed in history tab.   Important Outpatient Medications: -Calcium unknown dose -Bariatric multivitamin (containing 500 mcg vitamin B12) BID  Remainder reviewed in medication history.   Objective: BP (!) 110/57 (BP Location: Left Arm)   Pulse 84   Temp 98.2 F (36.8 C) (Oral)   Resp 18   SpO2 100%   Exam: General: Age-appropriate, resting comfortably in chair, NAD, alert and at baseline. HEENT:  Head: Normocephalic, atraumatic. Eyes: PERRLA. No conjunctival erythema or scleral injections. Mouth/Oral: Clear, no tonsillar exudate. MMM. Cardiovascular: Regular rate and rhythm. Normal S1/S2. No murmurs, rubs, or gallops appreciated. 2+ radial pulses. Pulmonary: Clear bilaterally to ascultation. No wheezes, crackles, or rhonchi. No increased WOB, no accessory muscle usage on room air. Abdominal: Well healed laparotomy scars on abdomen. Excess skin tissue. No tenderness to deep or light palpation. No rebound or guarding, nondistended. No HSM. Extremities: No peripheral edema bilaterally. Capillary refill <2 seconds.  Neurological examination: MS:  Awake, alert, interactive. Normal eye contact, answered the questions appropriately, speech was fluent, normal comprehension.  Attention and concentration were normal.  Alert to self, place, month, and situation. Cranial nerves: CN II:  PERRLA.  Visual fields intact  to confrontation.  Blinks normally to threat bilaterally. CNs III, IV, VI:  Full extraocular eye movement without nystagmus.  No ptosis or diplopia. CN V:  Facial sensation is normal, no weakness of masticatory muscles.  CN VII:  No facial weakness or asymmetry.  CN VIII:  Auditory acuity grossly normal.  Hearing intact to finger rub bilaterally. CNs XI/X:  Palate elevation symmetric. CN XI:  Normal sternocleidomastoid and trapezius strength. CN XII: Tongue is midline without atrophy or fasciculations. MOTOR:  Strength 5/5 RUE, 5/5 LUE, 5/5 RLE, 5/5 LLE.  However, notably weakness with abduction and adduction of R 4th and 5th digits compared to L. COORDINATION:  Intact finger-to-nose, no tremor.  No difficulty with balance.  Normal heel to shin. SENSATION:  Romberg negative.  Altered sensation over R forearm compared to L and over entire RLE compared to LLE, described as decreased sensation to touch. DTRs: Unable to illicit UE/LE reflex bilaterally. No clonus noted. GAIT:  Normal walk and run, no ataxia.   Labs:  CBC BMET  No results for input(s): "WBC", "HGB", "HCT", "PLT" in the last 168 hours. No results for input(s): "NA", "K", "CL", "CO2", "BUN", "CREATININE", "GLUCOSE", "CALCIUM" in the last 168 hours.   EKG: Not performed.   Imaging Studies Performed: Outpatient MR cervical spine 2/28: T2 hyperintense signal abnormality within the dorsal aspect of the upper cervical spinal cord (at the C2 level) and extending to the cervicomedullary junction, spanning 2.6 cm in craniocaudal dimension. Differential considerations include inflammatory demyelination (MS, NMO), subacute combined degeneration, copper deficiency, sequela viral infection and syphilis/tabes dorsalis, among others. Post-contrast MR imaging of the cervical spine is recommended for further evaluation.  An MRI of the thoracic spine (with and without contrast) should also be considered.   Sharion Dove, Dimitry, MD 07/15/2023, 4:51  PM PGY-1, [facility]  Upper Level Addendum: I have seen and evaluated this patient along with Dr. Sharion Dove and reviewed the above note, making necessary revisions as appropriate. I agree with the medical decision making and physical exam as noted above. Tiffany Kocher, DO PGY-2 [facility]   FPTS Intern pager: [phone], text pages welcome Secure chat group [facility] Teaching Service

## 2023-07-16 ENCOUNTER — Inpatient Hospital Stay (HOSPITAL_COMMUNITY)

## 2023-07-16 DIAGNOSIS — M419 Scoliosis, unspecified: Secondary | ICD-10-CM | POA: Diagnosis not present

## 2023-07-16 DIAGNOSIS — M5124 Other intervertebral disc displacement, thoracic region: Secondary | ICD-10-CM | POA: Diagnosis not present

## 2023-07-16 DIAGNOSIS — M47814 Spondylosis without myelopathy or radiculopathy, thoracic region: Secondary | ICD-10-CM | POA: Diagnosis not present

## 2023-07-16 DIAGNOSIS — D649 Anemia, unspecified: Secondary | ICD-10-CM | POA: Insufficient documentation

## 2023-07-16 DIAGNOSIS — R202 Paresthesia of skin: Secondary | ICD-10-CM

## 2023-07-16 DIAGNOSIS — D508 Other iron deficiency anemias: Secondary | ICD-10-CM | POA: Diagnosis not present

## 2023-07-16 DIAGNOSIS — R29898 Other symptoms and signs involving the musculoskeletal system: Secondary | ICD-10-CM | POA: Diagnosis not present

## 2023-07-16 LAB — RAPID URINE DRUG SCREEN, HOSP PERFORMED
Amphetamines: NOT DETECTED
Barbiturates: NOT DETECTED
Benzodiazepines: NOT DETECTED
Cocaine: NOT DETECTED
Opiates: NOT DETECTED
Tetrahydrocannabinol: NOT DETECTED

## 2023-07-16 LAB — IRON AND TIBC
Iron: 50 ug/dL (ref 28–170)
Saturation Ratios: 14 % (ref 10.4–31.8)
TIBC: 364 ug/dL (ref 250–450)
UIBC: 314 ug/dL

## 2023-07-16 LAB — BASIC METABOLIC PANEL
Anion gap: 9 (ref 5–15)
BUN: 6 mg/dL (ref 6–20)
CO2: 23 mmol/L (ref 22–32)
Calcium: 8.7 mg/dL — ABNORMAL LOW (ref 8.9–10.3)
Chloride: 105 mmol/L (ref 98–111)
Creatinine, Ser: 0.65 mg/dL (ref 0.44–1.00)
GFR, Estimated: >60 mL/min (ref >60)
Glucose, Bld: 133 mg/dL — ABNORMAL HIGH (ref 70–99)
Potassium: 3.4 mmol/L — ABNORMAL LOW (ref 3.5–5.1)
Sodium: 137 mmol/L (ref 135–145)

## 2023-07-16 LAB — FERRITIN: Ferritin: 10 ng/mL — ABNORMAL LOW (ref 11–307)

## 2023-07-16 LAB — RPR: RPR Ser Ql: NONREACTIVE

## 2023-07-16 LAB — PREGNANCY, URINE: Preg Test, Ur: NEGATIVE

## 2023-07-16 MED ORDER — IRON SUCROSE 500 MG IVPB - SIMPLE MED
500.0000 mg | Freq: Once | INTRAVENOUS | Status: DC
  Filled 2023-07-16: qty 275

## 2023-07-16 MED ORDER — SODIUM CHLORIDE 0.9 % IV SOLN
500.0000 mg | Freq: Once | INTRAVENOUS | Status: AC
  Administered 2023-07-16: 500 mg via INTRAVENOUS
  Filled 2023-07-16: qty 25

## 2023-07-16 MED ORDER — LORAZEPAM 2 MG/ML IJ SOLN
INTRAMUSCULAR | Status: AC
  Filled 2023-07-16: qty 1

## 2023-07-16 MED ORDER — POTASSIUM CHLORIDE 20 MEQ PO PACK
20.0000 meq | PACK | Freq: Once | ORAL | Status: AC
  Administered 2023-07-16: 20 meq via ORAL
  Filled 2023-07-16: qty 1

## 2023-07-16 MED ORDER — LORAZEPAM 2 MG/ML IJ SOLN
2.0000 mg | Freq: Once | INTRAMUSCULAR | Status: AC
  Administered 2023-07-16: 2 mg via INTRAVENOUS

## 2023-07-16 MED ORDER — GADOBUTROL 1 MMOL/ML IV SOLN
7.0000 mL | Freq: Once | INTRAVENOUS | Status: AC | PRN
  Administered 2023-07-16: 7 mL via INTRAVENOUS

## 2023-07-16 MED ORDER — VITAMIN B-12 1000 MCG PO TABS
1000.0000 ug | ORAL_TABLET | Freq: Every day | ORAL | Status: DC
Start: 2023-07-16 — End: 2023-07-18
  Administered 2023-07-16 – 2023-07-18 (×3): 1000 ug via ORAL
  Filled 2023-07-16 (×3): qty 1

## 2023-07-16 MED ORDER — IRON SUCROSE 20 MG/ML IV SOLN: 100.0000 mg | Freq: Once | INTRAVENOUS | Status: DC

## 2023-07-16 MED ORDER — MELATONIN 3 MG PO TABS
3.0000 mg | ORAL_TABLET | Freq: Every day | ORAL | Status: DC
  Administered 2023-07-16 – 2023-07-17 (×2): 3 mg via ORAL
  Filled 2023-07-16 (×2): qty 1

## 2023-07-16 NOTE — Procedures (Signed)
 Procedure Note: Lumbar puncture  Indications: assess for CNS pathology   Operator: Chelsa Stout de la [place], NP  Others present:   Indications, risks, and benefits explained to patient / surrogate decision maker and informed consent obtained. Time-out was performed, with all individuals present agreeing on the procedure to be performed, the site of procedure, and the patient identity.  Patient positioned, prepped and draped in usual sterile fashion. L3-4 space located using bilateral iliac crests as landmarks. 1% Lidocaine without epinephrine was used to anesthetize the area. Attempted to introduce 20ga into the suabarachnoid space between L4 and L5 but was unable to obtain CSF.  A dry guaze dressing was placed over insertion site. Patient tolerated the procedure well and no complications were observed. Spontaneous movement of bilateral upper and lower extremities were observed after the procedure.  Will re attempt procedure under fluoroscopy.  Melanie Cisneros E Ernestina Columbia , MSN, AGACNP-BC [facility] See Amion for schedule and pager information 07/16/2023 12:47 PM

## 2023-07-16 NOTE — Plan of Care (Signed)

## 2023-07-16 NOTE — Progress Notes (Addendum)
 NEUROLOGY CONSULT FOLLOW UP NOTE   Date of service: July 16, 2023 Patient Name: Melanie Cisneros MRN:  [id number] DOB:  1990/06/09  Interval Hx/subjective   Patient has been hemodynamically stable and afebrile overnight.  She reports no weakness and only baseline numbness in her right arm and leg.  She is willing to proceed with lumbar puncture later today. Vitals   Vitals:   07/15/23 1946 07/15/23 2331 07/16/23 0405 07/16/23 0800  BP: 109/76 110/63 111/65 105/70  Pulse: 72 83 (!) 105 69  Resp: 18 16 16 16   Temp: 98 F (36.7 C) 97.8 F (36.6 C) 98 F (36.7 C) (!) 97.5 F (36.4 C)  TempSrc: Oral Oral Oral Oral  SpO2: 100% 96% 99% 100%     There is no height or weight on file to calculate BMI.  Physical Exam   Constitutional: Appears well-developed and well-nourished.  Psych: Affect appropriate to situation.  Eyes: No scleral injection.  HENT: No OP obstrucion.  Head: Normocephalic.  Respiratory: Effort normal, non-labored breathing.  Skin: WDI.   Neurologic Examination    NEURO:  Mental Status: Alert and oriented to person place time and situation, able to discuss current illness without difficulty Speech/Language: speech is without dysarthria or aphasia.    Cranial Nerves:  II: PERRL.  III, IV, VI: EOMI. Eyelids elevate symmetrically.  V: Sensation is intact to light touch and symmetrical to face.  VII: Smile is symmetrical.  VIII: hearing intact to voice. IX, X: Phonation is normal.  UY:[id number] shrug 5/5. XII: tongue is midline without fasciculations. Motor: 5/5 symmetrical strength to all muscle groups tested except 4+ bilateral hip flexion.  Sensation- Intact to light touch bilaterally but diminished in the right arm and leg Coordination: FTN intact bilaterally DTRs: 2+ in bilateral biceps, unable to elicit patellar even with distraction, 1+ achilles  Gait- deferred   Medications  Current Facility-Administered Medications:    acetaminophen (TYLENOL)  tablet 650 mg, 650 mg, Oral, Q6H PRN, Shitarev, Dimitry, MD, 650 mg at 07/15/23 1908   cyanocobalamin (VITAMIN B12) tablet 1,000 mcg, 1,000 mcg, Oral, Daily, Yetta Barre, Sarah, DO   multivitamin with minerals tablet 1 tablet, 1 tablet, Oral, Daily, Shitarev, Dimitry, MD, 1 tablet at 07/15/23 1836  Labs and Diagnostic Imaging   CBC:  Recent Labs  Lab 07/15/23 1650  WBC 7.0  HGB 11.6*  HCT 34.2*  MCV 84.7  PLT 321    Basic Metabolic Panel:  Lab Results  Component Value Date   NA 137 07/16/2023   K 3.4 (L) 07/16/2023   CO2 23 07/16/2023   GLUCOSE 133 (H) 07/16/2023   BUN 6 07/16/2023   CREATININE 0.65 07/16/2023   CALCIUM 8.7 (L) 07/16/2023   GFRNONAA >60 07/16/2023   GFRAA >60 01/12/2019   Lipid Panel:  Lab Results  Component Value Date   LDLCALC 73 01/31/2023   Zinc pending Copper pending B12 442 -- low normal MMA pending Folate 27.8 Vitamin E pending Thiamine pending  HIV negative RPR pending  NMO pending MOG pending ACE pending   MRI Brain, cervical and thoracic spine (Personally reviewed): No acute intracranial abnormality, mildly low-lying cerebellar tonsils and mild platybasia, patchy postcontrast enhancement of dorsal aspect of the upper cervical cord at the level of the cervicomedullary junction, no enhancement of thoracic spinal cord   Assessment   Melanie Cisneros is a 33 y.o. female with history of ulnar neuropathy and possible carpal tunnel syndrome on the right who presented with worsening paresthesias in right lower  extremity and back with subjective bilateral lower extremity weakness.  She states that symptoms are intermittent and not present at the time of exam today.  MRI brain cervical and thoracic spine reveals a contrast-enhancing lesion to the dorsal aspect of the upper cervical spinal cord.  Patient will need lumbar puncture to investigate if this lesion is due to MS, NMO, neurosarcoidosis or of infectious etiology.  Myelopathy labs are  pending.  Recommendations  - Follow-up on myelopathy labs -- Agree with B12 1000 mcg daily, f/u MMA level - Lumbar puncture to be attempted at bedside today with cell counts and differential and tubes 1 and 4, protein, glucose, oligoclonal bands, IgG index, VDRL, meningitis encephalitis panel, EBV, CMV and opening pressure -- CT Chest/Abdomen/Pelvis to assess for sarcoidosis or other occult infectious/inflammatory process -- Neurology will follow along ______________________________________________________________________  Patient seen by NP and then by MD, MD to edit note as needed.  Signed, Cortney E Ernestina Columbia, NP [facility]   Attending Neurologist's note:  I personally saw this patient, gathering history, performing a full neurologic examination, reviewing relevant labs, personally reviewing relevant imaging including MRI C-spine, and formulated the assessment and plan, adding the note above for completeness and clarity to accurately reflect my thoughts   Brooke Dare MD-PhD [facility] [phone] Available 7 AM to 7 PM, outside these hours please contact Neurologist on call listed on AMION

## 2023-07-16 NOTE — Progress Notes (Addendum)
     Daily Progress Note Intern Pager: [phone]  Patient name: Melanie Cisneros Medical record number: [id number] Date of birth: Sep 26, 1990 Age: 33 y.o. Gender: female  Primary Care Provider: Darral Dash, DO Consultants: Neurology Code Status: Full  Pt Overview and Major Events to Date:  07/15/2023-admitted  Assessment and Plan: Melanie Cisneros is a 33 y.o. female with PMH of gastric bypass and R ulnar neuropathy direct admitted from home after MRI w/out contrast showed concern for unclear cervical pathology in setting of progressive paraesthesias and numbness in both RUE and RLE as well as intermittent RLE weakness.  Neurology is on board with plans for further imaging and labs to narrow diagnosis which includes MS, other autoimmune disorders, infectious etiologies, nutritional deficiencies. Still awaiting urine MMA however B12 WNL so B12 deficiency unlikely.  HIV negative, folate WNL MRI thoracic spine, cervical spine and brain with contrast have returned showing patchy enhancement at dorsal upper cervical cord (consistent with previous MRI without contrast), minor spondylosis at T4-5 through T7-8, cerebellar tonsils mildly low-lying with no other brain abnormalities Assessment & Plan Paresthesia Broad differential as above and will await labs and imaging to elucidate etiology. -Admit to FMTS, Med-Surg, attending Dr. Lum Babe -Neurology routine consult, appreciate recommendations, may do lumbar puncture during admission -Change vitamin B12 to oral 1000 mcg daily given B12 WNL -f/u RPR, vitamin E, copper, zinc, MMA, neuromyelitis optica autoab, IgG, ACE, ceruloplasmin Anemia Very mild normocytic anemia with hemoglobin 11.6 -f/u ferritin, TIBC, iron   Chronic and Stable Issues: History of gastric bypass: Continue daily multivitamin, consider additional supplementation as needed   FEN/GI: Regular diet PPx: Patient declines Lovenox, SCDs ordered Dispo: Home pending continued  medical management  Subjective:  Patient with no complaints this morning, had a headache yesterday which is now resolved.  Still having odd sensations of RUE and RLE  Objective: Temp:  [97.8 F (36.6 C)-98.2 F (36.8 C)] 98 F (36.7 C) (03/02 0405) Pulse Rate:  [72-105] 105 (03/02 0405) Resp:  [16-18] 16 (03/02 0405) BP: (109-111)/(57-76) 111/65 (03/02 0405) SpO2:  [96 %-100 %] 99 % (03/02 0405) Physical Exam: General: Lying in bed, well-appearing, NAD Cardiovascular: RRR, no murmur Respiratory: CTAB, normal effort Neuro: Alert, no focal deficits decreased sensation to RUE and odd sensation RLE which is difficult for her to describe.  5/5 muscle strength of BLEs and BUEs  Laboratory: Most recent CBC Lab Results  Component Value Date   WBC 7.0 07/15/2023   HGB 11.6 (L) 07/15/2023   HCT 34.2 (L) 07/15/2023   MCV 84.7 07/15/2023   PLT 321 07/15/2023   Most recent BMP    Latest Ref Rng & Units 12/09/2022   12:52 PM  BMP  Glucose 70 - 99 mg/dL 73   BUN 6 - 20 mg/dL 6   Creatinine 1.30 - 8.65 mg/dL 7.84   Sodium 696 - 295 mmol/L 141   Potassium 3.5 - 5.1 mmol/L 3.8   Chloride 98 - 111 mmol/L 105   CO2 22 - 32 mmol/L 26   Calcium 8.9 - 10.3 mg/dL 9.4      Erick Alley, DO 07/16/2023, 5:01 AM  PGY-3, [facility] FPTS Intern pager: [phone], text pages welcome Secure chat group [facility] Teaching Service

## 2023-07-16 NOTE — Hospital Course (Signed)
 Melanie Cisneros is a 33 y.o.female with a history of gastric bypass and R ulnar neuropathy who was direct admitted from home after MRI w/out contrast showed concern for unclear cervical pathology in setting of progressive paraesthesias and numbness in both RUE and RLE as well as intermittent RLE weakness. Her hospital course is detailed below:  Paresthesias She was directly admitted with neurology on board for further workup. MRI thoracic spine, cervical spine and brain with contrast showing patchy enhancement at dorsal upper cervical cord (consistent with previous MRI without contrast), minor spondylosis at T4-5 through T7-8, cerebellar tonsils mildly low-lying with no other brain abnormalities.  Labs showing: B12 and folate WNL, HIV negative, Ferritin low 10. Myelopathy labs pending. Pt was supplemented with vitamin B12 and received iron infusion x1. CT chest/abdomen/pelvis obtained and unremarkable for sarcoidosis or other occult process. Neurology attempted LP x2, second attempt successful under fluoroscopy showing Cell counts and tubes 1/4 RBC 23/1, WBC 8/6 (too few to count, lymphocytes with rare monocytes on smear); protein 18, glucose 52 (serum 89). CSF meningitis/encephalitis panel negative. Pt was started on IV solu-medrol and will continue IV steroids at outpatient infusion clinic on discharge.   She was discharged in stable condition with plans to follow up with Dr. Epimenio Foot at [facility].   Other chronic conditions were medically managed with home medications and formulary alternatives as necessary   PCP Follow-up Recommendations: Ensure follow up with neurology F/u anemia

## 2023-07-16 NOTE — Assessment & Plan Note (Signed)
 Very mild normocytic anemia with hemoglobin 11.6 -f/u ferritin, TIBC, iron

## 2023-07-16 NOTE — Progress Notes (Signed)
 PT Cancellation Note  Patient Details Name: Melanie Cisneros MRN: [id number] DOB: 06/15/1990   Cancelled Treatment:    Reason Eval/Treat Not Completed: PT screened, no needs identified, will sign off  Patient reports up walking independently. Denies imbalance or near falls. No PT indicated.    Jerolyn Center, PT Acute Rehabilitation Services  Office [phone]  Zena Amos 07/16/2023, 8:01 AM

## 2023-07-16 NOTE — Discharge Instructions (Addendum)
 Dear Melanie Cisneros,  Thank you for letting us participate in your care. You were hospitalized for abnormal sensations of your right arm and leg.  You had MRIs of your upper back, neck and brain which showed abnormal brightness in the cervical spine. Neurology recommended starting IV steroids and will continue your workup outpatient.   POST-HOSPITAL & CARE INSTRUCTIONS Please follow up with your PCP within 1-2 weeks Please follow up with neurology - Dr. Epimenio Foot at [facility] - they should schedule for you Go to your follow up appointments (listed below)  DOCTOR'S APPOINTMENT   Future Appointments  Date Time Provider Department Center  07/24/2023  2:50 PM Nita Sickle K, DO [facility] None  09/05/2023  2:00 PM Theressa Millard, PT [facility]  09/12/2023  2:00 PM Theressa Millard, PT [facility]  09/19/2023  2:00 PM Theressa Millard, PT [facility]  09/26/2023  2:00 PM Theressa Millard, PT [facility]   Please return to the ED if you develop: New or worsening symptoms such as weakness, numbness Chest pain, shortness of breath  Take care and be well!  Family Medicine Teaching Service Inpatient Team [place]  [facility]  [address] [place], [place] [address] [phone]

## 2023-07-16 NOTE — Progress Notes (Signed)
 OT Cancellation Note  Patient Details Name: AALAYA YADAO MRN: [id number] DOB: Oct 17, 1990   Cancelled Treatment:    Reason Eval/Treat Not Completed: OT screened, no needs identified, will sign off. Pt up and independent in room with no needs identified at this time. Please re-consult if change in status.   Tyler Deis, OTR/L [facility] Acute Rehabilitation Office: [phone]   Myrla Halsted 07/16/2023, 8:54 AM

## 2023-07-16 NOTE — Assessment & Plan Note (Addendum)
 Broad differential as above and will await labs and imaging to elucidate etiology. -Admit to FMTS, Med-Surg, attending Dr. Lum Babe -Neurology routine consult, appreciate recommendations, may do lumbar puncture during admission -Change vitamin B12 to oral 1000 mcg daily given B12 WNL -f/u RPR, vitamin E, copper, zinc, MMA, neuromyelitis optica autoab, IgG, ACE, ceruloplasmin

## 2023-07-16 NOTE — Plan of Care (Addendum)
 Spoke with NP  de Saintclair Halsted who  stated neuro will reattempt LP under fluoroscopy.  Was okay with PO B12 and iron infusion given low ferritin.  Jerre Simon, MD PGY-3, [facility] Family Medicine Resident  Please page [phone] with questions.

## 2023-07-17 ENCOUNTER — Inpatient Hospital Stay (HOSPITAL_COMMUNITY)

## 2023-07-17 ENCOUNTER — Ambulatory Visit: Payer: Self-pay

## 2023-07-17 ENCOUNTER — Other Ambulatory Visit: Payer: Self-pay

## 2023-07-17 ENCOUNTER — Encounter (HOSPITAL_COMMUNITY): Payer: Self-pay | Admitting: Student

## 2023-07-17 DIAGNOSIS — R202 Paresthesia of skin: Secondary | ICD-10-CM | POA: Diagnosis not present

## 2023-07-17 DIAGNOSIS — K5641 Fecal impaction: Secondary | ICD-10-CM | POA: Diagnosis not present

## 2023-07-17 DIAGNOSIS — D508 Other iron deficiency anemias: Secondary | ICD-10-CM

## 2023-07-17 DIAGNOSIS — N898 Other specified noninflammatory disorders of vagina: Secondary | ICD-10-CM | POA: Insufficient documentation

## 2023-07-17 DIAGNOSIS — K5900 Constipation, unspecified: Secondary | ICD-10-CM | POA: Insufficient documentation

## 2023-07-17 LAB — CSF CELL COUNT WITH DIFFERENTIAL
RBC Count, CSF: 1 /mm3 — ABNORMAL HIGH
RBC Count, CSF: 23 /mm3 — ABNORMAL HIGH
Tube #: 1
Tube #: 4
WBC, CSF: 6 /mm3 — ABNORMAL HIGH (ref 0–5)
WBC, CSF: 8 /mm3 — ABNORMAL HIGH (ref 0–5)

## 2023-07-17 LAB — MISC LABCORP TEST (SEND OUT): Labcorp test code: 505310

## 2023-07-17 LAB — MENINGITIS/ENCEPHALITIS PANEL (CSF)

## 2023-07-17 LAB — GC/CHLAMYDIA PROBE AMP (~~LOC~~) NOT AT ARMC
Chlamydia: NEGATIVE
Comment: NEGATIVE
Comment: NORMAL
Neisseria Gonorrhea: NEGATIVE

## 2023-07-17 LAB — GLUCOSE, CAPILLARY
Glucose-Capillary: 223 mg/dL — ABNORMAL HIGH (ref 70–99)
Glucose-Capillary: 188 mg/dL — ABNORMAL HIGH (ref 70–99)

## 2023-07-17 LAB — BASIC METABOLIC PANEL
Anion gap: 8 (ref 5–15)
BUN: 6 mg/dL (ref 6–20)
CO2: 23 mmol/L (ref 22–32)
Calcium: 8.7 mg/dL — ABNORMAL LOW (ref 8.9–10.3)
Chloride: 108 mmol/L (ref 98–111)
Creatinine, Ser: 0.57 mg/dL (ref 0.44–1.00)
GFR, Estimated: >60 mL/min (ref >60)
Glucose, Bld: 89 mg/dL (ref 70–99)
Potassium: 4 mmol/L (ref 3.5–5.1)
Sodium: 139 mmol/L (ref 135–145)

## 2023-07-17 LAB — ANGIOTENSIN CONVERTING ENZYME: Angiotensin-Converting Enzyme: 40 U/L (ref 14–82)

## 2023-07-17 LAB — NEUROMYELITIS OPTICA AUTOAB, IGG: NMO-IgG: <1.5 U/mL (ref 0.0–3.0)

## 2023-07-17 LAB — CERULOPLASMIN: Ceruloplasmin: 19 mg/dL (ref 19.0–39.0)

## 2023-07-17 LAB — PROTEIN AND GLUCOSE, CSF
Glucose, CSF: 52 mg/dL (ref 40–70)
Total  Protein, CSF: 18 mg/dL (ref 15–45)

## 2023-07-17 MED ORDER — METHYLPREDNISOLONE SODIUM SUCC 1000 MG IJ SOLR
1000.0000 mg | Freq: Every day | INTRAMUSCULAR | Status: DC
  Administered 2023-07-18: 1000 mg via INTRAVENOUS
  Filled 2023-07-17 (×3): qty 16

## 2023-07-17 MED ORDER — LORAZEPAM 2 MG/ML IJ SOLN
2.0000 mg | Freq: Once | INTRAMUSCULAR | Status: AC | PRN
  Administered 2023-07-17: 2 mg via INTRAVENOUS
  Filled 2023-07-17: qty 1

## 2023-07-17 MED ORDER — LIDOCAINE 1 % OPTIME INJ - NO CHARGE
5.0000 mL | Freq: Once | INTRAMUSCULAR | Status: AC
  Administered 2023-07-17: 4 mL via INTRADERMAL
  Filled 2023-07-17: qty 6

## 2023-07-17 MED ORDER — SODIUM CHLORIDE 0.9 % IV SOLN
1000.0000 mg | Freq: Once | INTRAVENOUS | Status: AC
  Administered 2023-07-17: 1000 mg via INTRAVENOUS
  Filled 2023-07-17: qty 16

## 2023-07-17 MED ORDER — POLYETHYLENE GLYCOL 3350 17 G PO PACK
17.0000 g | PACK | Freq: Every day | ORAL | Status: DC
  Administered 2023-07-17 – 2023-07-18 (×2): 17 g via ORAL
  Filled 2023-07-17 (×2): qty 1

## 2023-07-17 MED ORDER — PANTOPRAZOLE SODIUM 40 MG PO TBEC
40.0000 mg | DELAYED_RELEASE_TABLET | Freq: Every day | ORAL | Status: DC
  Administered 2023-07-17 – 2023-07-18 (×2): 40 mg via ORAL
  Filled 2023-07-17 (×2): qty 1

## 2023-07-17 MED ORDER — IOHEXOL 350 MG/ML SOLN
75.0000 mL | Freq: Once | INTRAVENOUS | Status: AC | PRN
  Administered 2023-07-17: 75 mL via INTRAVENOUS

## 2023-07-17 MED ORDER — ONDANSETRON 4 MG PO TBDP
4.0000 mg | ORAL_TABLET | Freq: Three times a day (TID) | ORAL | Status: DC | PRN
  Administered 2023-07-17: 4 mg via ORAL
  Filled 2023-07-17: qty 1

## 2023-07-17 NOTE — Progress Notes (Signed)
 Orders to move patient off 3 Chad; report called to RN for [phone]; patient transferred via bed post lumbar puncture; continues on bedrest.

## 2023-07-17 NOTE — Progress Notes (Addendum)
 NEUROLOGY CONSULT FOLLOW UP NOTE   Date of service: July 17, 2023 Patient Name: Melanie Cisneros MRN:  [id number] DOB:  04/23/1991  Interval Hx/subjective   No family at the bedside  No new neurological events overnight. Vitals and labs are stable  Patient is scheduled for Lp under Fluoro today at 0900.  Neurological exam is unchanged   Vitals   Vitals:   07/16/23 2332 07/17/23 0332 07/17/23 0413 07/17/23 0727  BP: 115/64 (!) 107/90 108/61 112/65  Pulse: 67 64 62 65  Resp: 16   17  Temp: 97.7 F (36.5 C) 98.1 F (36.7 C)  98.1 F (36.7 C)  TempSrc: Oral Oral  Oral  SpO2: 97% 99% 99% 100%     There is no height or weight on file to calculate BMI.  Physical Exam   Constitutional: Appears well-developed and well-nourished.  Psych: Affect appropriate to situation.  Eyes: No scleral injection.  HENT: No OP obstrucion.  Head: Normocephalic.  Cardiovascular: Normal rate and regular rhythm.  Respiratory: Effort normal, non-labored breathing.  GI: Soft.  No distension. There is no tenderness.  Skin: WDI.   Neurologic Examination   NEURO:  Mental Status: Alert and oriented to person place time and situation Speech/Language: speech is without dysarthria or aphasia.     Cranial Nerves:  II: PERRL.  III, IV, VI: EOMI. Eyelids elevate symmetrically.  V: Sensation is intact to light touch and symmetrical to face.  VII: Smile is symmetrical.  VIII: hearing intact to voice. IX, X: Phonation is normal.  [id number] shrug 5/5. XII: tongue is midline without fasciculations. Motor: 5/5 symmetrical strength to all muscle groups tested except 4+ bilateral hip flexion.  Sensation- Intact to light touch bilaterally but diminished in the right arm and leg Coordination: FTN intact bilaterally Gait- deferred  Medications  Current Facility-Administered Medications:    acetaminophen (TYLENOL) tablet 650 mg, 650 mg, Oral, Q6H PRN, Shitarev, Dimitry, MD, 650 mg at 07/15/23 1908    cyanocobalamin (VITAMIN B12) tablet 1,000 mcg, 1,000 mcg, Oral, Daily, Erick Alley, DO, 1,000 mcg at 07/16/23 1155   LORazepam (ATIVAN) injection 2 mg, 2 mg, Intravenous, Once PRN, Aristea Posada L, MD   melatonin tablet 3 mg, 3 mg, Oral, QHS, Spence, Sarah, DO, 3 mg at 07/16/23 2036   multivitamin with minerals tablet 1 tablet, 1 tablet, Oral, Daily, Shitarev, Dimitry, MD, 1 tablet at 07/16/23 1155  Labs and Diagnostic Imaging   CBC:  Recent Labs  Lab 07/15/23 1650  WBC 7.0  HGB 11.6*  HCT 34.2*  MCV 84.7  PLT 321    Basic Metabolic Panel:  Lab Results  Component Value Date   NA 139 07/17/2023   K 4.0 07/17/2023   CO2 23 07/17/2023   GLUCOSE 89 07/17/2023   BUN 6 07/17/2023   CREATININE 0.57 07/17/2023   CALCIUM 8.7 (L) 07/17/2023   GFRNONAA >60 07/17/2023   GFRAA >60 01/12/2019   Lipid Panel:  Lab Results  Component Value Date   LDLCALC 73 01/31/2023   HgbA1c: No results found for: "HGBA1C" Urine Drug Screen:     Component Value Date/Time   LABOPIA NONE DETECTED 07/16/2023 0919   COCAINSCRNUR NONE DETECTED 07/16/2023 0919   LABBENZ NONE DETECTED 07/16/2023 0919   AMPHETMU NONE DETECTED 07/16/2023 0919   THCU NONE DETECTED 07/16/2023 0919   LABBARB NONE DETECTED 07/16/2023 0919    Alcohol Level No results found for: "ETH" INR No results found for: "INR" APTT No results found for: "APTT" AED levels:  No results found for: "PHENYTOIN", "ZONISAMIDE", "LAMOTRIGINE", "LEVETIRACETA"  MRI Brain, cervical and thoracic spine (Personally reviewed): No acute intracranial abnormality, mildly low-lying cerebellar tonsils and mild platybasia, patchy postcontrast enhancement of dorsal aspect of the upper cervical cord at the level of the cervicomedullary junction, no enhancement of thoracic spinal cord  CT chest/abd/pelvis w contrast:  No acute abnormality in the chest, abdomen, or pelvis.   LABS: Zinc pending Copper pending Ceruloplasmin pending B12 442 -- low  normal MMA pending Folate 27.8 Vitamin E pending Thiamine pending   HIV negative RPR nonreactive   NMO pending MOG pending ACE pending Assessment probably   [name] is a 33 y.o. female with history of ulnar neuropathy and possible carpal tunnel syndrome on the right who presented with worsening paresthesias in right lower extremity and back with subjective bilateral lower extremity weakness.  She states that symptoms are intermittent and not present at the time of exam today.  MRI brain cervical and thoracic spine reveals a contrast-enhancing lesion to the dorsal aspect of the upper cervical spinal cord.  Patient will need lumbar puncture under fluoro to investigate if this lesion is due to MS, NMO, neurosarcoidosis or of an infectious etiology.  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 Epimenio Foot at [facility] placed -- Neurology will follow along, discussed with primary team via secure chat  Addendum,   CSF studies reviewed  meningitis/encephalitis panel negative Cell counts and tubes 1/4 RBC 23/1, WBC 8/6 (too few to count, lymphocytes with rare monocytes on smear); protein 18, glucose 52 (serum 89)  Discussed with primary team, first dose of IV Solu-Medrol ASAP, next dose tomorrow at 8 AM, likely can discharge tomorrow afternoon with completion of  IV steroids via outpatient infusion clinic if patient tolerates steroids well  ______________________________________________________________________ Patient seen by NP and then by MD, MD to edit note as needed.   Gevena Mart DNP, ACNPC-AG  [facility]  Attending Neurologist's note:  Seen after lumbar puncture with family at bedside.  Patient reports no new symptoms, stable paresthesias.   Limited examination by myself given her recent lumbar puncture held off on strength testing but mental status and cranial nerves remain stable as documented above Discussed plan if CSF preliminary studies (cell counts, protein, glucose, meningitis/encephalitis panel) is negative for signs of infection we will proceed with pulse dose steroids If patient is tolerating pulse dose steroids well today and tomorrow, could discharge tomorrow afternoon with remainder of her IV steroids arranged in the outpatient infusion center here  I personally saw this patient, gathering history, performing a full neurologic examination, reviewing relevant labs, personally reviewing relevant imaging including MRI brain, C-spine and T-spine, and formulated the assessment and plan, adding the note above for completeness and clarity to accurately reflect my thoughts  Brooke Dare MD-PhD [facility] [phone] Available 7 AM to 7 PM, outside these hours please contact Neurologist on call listed on AMION

## 2023-07-17 NOTE — Assessment & Plan Note (Addendum)
 Broad differential, though less likely infectious now.  Contrast-enhancing lesion to the dorsal aspect of the upper cervical spinal cord present. -Change vitamin B12 to oral 1000 mcg daily given B12 WNL -f/u RPR, vitamin E, copper, zinc, MMA, neuromyelitis optica autoab, IgG, ACE, ceruloplasmin -Neurology routine consult, appreciate recommendations:  -Pulse dose steroid Solu-Medrol 1 g daily 3-5 days depending on response  -Can discharge tomorrow if response is adequate and continued outpatient infusions  -Protonix 40 mg daily while on steroid  -CBGs TID postprandial -Melatonin for sleep aid

## 2023-07-17 NOTE — Assessment & Plan Note (Signed)
 Mild abdominal discomfort, moderate colonic stool burden on CTAP overnight. -Daily Miralax

## 2023-07-17 NOTE — Plan of Care (Signed)

## 2023-07-17 NOTE — Plan of Care (Signed)

## 2023-07-17 NOTE — Assessment & Plan Note (Signed)
-  GC/chlamydia urine probe pending

## 2023-07-17 NOTE — Assessment & Plan Note (Addendum)
 Mild normocytic anemia with hemoglobin 11.6.  Low ferritin.  s/p IV iron infusion.

## 2023-07-17 NOTE — Progress Notes (Addendum)
 Daily Progress Note Intern Pager: [phone]  Patient name: Melanie Cisneros Medical record number: [id number] Date of birth: 20-Oct-1990 Age: 33 y.o. Gender: female  Primary Care Provider: Darral Dash, DO Consultants: Neurology Code Status: FULL  Pt Overview and Major Events to Date:  3/1: Admitted 3/2: LP unsuccessful, MRIs w/ contrast showing same cervical abnormality 3/3: DG guided LP  Assessment and Plan: [name] is a 33 y.o. female with a pertinent PMH of gastric bypass and R ulnar neuropathy direct admitted from home after MRI w/out contrast showed concern for unclear cervical pathology in setting of progressive paraesthesias and numbness in both RUE and RLE as well as intermittent subjective RLE weakness.  Workup per Neurology is continuing.  Given that CSF cultures and labs from LP this are negative for infection, will proceed with empiric steroids per Neurology plan.  VDRL still pending, though RPR was negative.  Autoimmune condition remains higher on differential, though nutritional deficiency still possible with zinc, copper, vitamin B1, MMA, vitamin E still in process.  Wilsons disease also possibility, though less likely.  If patient has appropriate response to steroids, can discharge and continue outpatient infusions tomorrow.  Will follow-up further autoimmune labs as they become available after discharge. Assessment & Plan Paresthesia Broad differential, though less likely infectious now.  Contrast-enhancing lesion to the dorsal aspect of the upper cervical spinal cord present. -Change vitamin B12 to oral 1000 mcg daily given B12 WNL -f/u RPR, vitamin E, copper, zinc, MMA, neuromyelitis optica autoab, IgG, ACE, ceruloplasmin -Neurology routine consult, appreciate recommendations:  -Pulse dose steroid Solu-Medrol 1 g daily 3-5 days depending on response  -Can discharge tomorrow if response is adequate and continued outpatient infusions  -Protonix 40 mg  daily while on steroid  -CBGs TID postprandial -Melatonin for sleep aid Anemia Mild normocytic anemia with hemoglobin 11.6.  Low ferritin.  s/p IV iron infusion. Vaginal discharge -GC/chlamydia urine probe pending Constipation Mild abdominal discomfort, moderate colonic stool burden on CTAP overnight. -Daily Miralax  Chronic and Stable Problems: History of gastric bypass: Continue daily multivitamin, consider additional supplementation as needed  FEN/GI: Regular diet PPx: Patient declines Lovenox, SCDs ordered, encourage ambulation Dispo: Home pending continued medical management  Subjective: Patient evaluated after LP.  She remains in extremely stressed about her medical condition and wants answers.  She continues to endorse the same neurological symptoms with paresthesias and numbness in RLE.  Objective: Temp:  [97.5 F (36.4 C)-98.1 F (36.7 C)] 98.1 F (36.7 C) (03/03 0727) Pulse Rate:  [62-91] 65 (03/03 0727) Resp:  [16-18] 17 (03/03 0727) BP: (96-115)/(61-90) 112/65 (03/03 0727) SpO2:  [97 %-100 %] 100 % (03/03 0727)  Physical Exam: General: Age-appropriate, resting comfortably in bed, cutting hair. Anxious but NAD, alert and at baseline. Cardiovascular: Regular rate and rhythm. Normal S1/S2. No murmurs, rubs, or gallops appreciated. 2+ radial pulses. Pulmonary: Clear bilaterally to ascultation. No increased WOB, no accessory muscle usage on room air. No wheezes, crackles, or rhonchi. Skin: Warm and dry. No rashes grossly. Extremities: No peripheral edema bilaterally.  Capillary refill <2 seconds.  Neurological examination: MS:  Awake, alert, interactive. Normal eye contact, answered the questions appropriately, speech was fluent, normal comprehension.  Attention and concentration were normal.  Alert to self, place, month, and situation. Cranial nerves:    CN II:  PERRLA.  Visual fields intact to confrontation.  Blinks normally to threat bilaterally. CNs III, IV, VI:   Full extraocular eye movement without nystagmus.  No ptosis or  diplopia. CN V:  Facial sensation is normal, no weakness of masticatory muscles.  CN VII:  No facial weakness or asymmetry.  CN VIII:  Auditory acuity grossly normal.  Hearing intact to finger rub bilaterally. CNs XI/X:  Palate elevation symmetric. CN XI:  Normal sternocleidomastoid and trapezius strength. CN XII: Tongue is midline without atrophy or fasciculations. MOTOR:  Strength 5/5 RUE, 5/5 LUE, 5/5 RLE, 5/5 LLE. COORDINATION:  Intact finger-to-nose, no tremor.  No difficulty with balance. SENSATION: Altered sensation over RLE and R forearm compared to left.  Laboratory: Most recent CBC Lab Results  Component Value Date   WBC 7.0 07/15/2023   HGB 11.6 (L) 07/15/2023   HCT 34.2 (L) 07/15/2023   MCV 84.7 07/15/2023   PLT 321 07/15/2023   Most recent BMP    Latest Ref Rng & Units 07/16/2023    6:45 AM  BMP  Glucose 70 - 99 mg/dL 161   BUN 6 - 20 mg/dL 6   Creatinine 0.96 - 0.45 mg/dL 4.09   Sodium 811 - 914 mmol/L 137   Potassium 3.5 - 5.1 mmol/L 3.4   Chloride 98 - 111 mmol/L 105   CO2 22 - 32 mmol/L 23   Calcium 8.9 - 10.3 mg/dL 8.7     Other pertinent labs: -GC/chlamydia pending -CSF: Colorless liquid with few cells, minimal RBC, leukocytes.  Meningitis/encephalitis panel negative.  VDRL pending. -MMA pending -ACE pending -Ceruloplasmin, vitamin B1 pending  New Imaging/Diagnostic Tests: -DG FL guided LP: Performed, CSF samples unremarkable as above -CT AP: No adenopathy, evidence of sarcoidosis.  Moderate colonic stool burden.  Jazion Atteberry Sharion Dove, MD 07/17/2023, 7:36 AM  PGY-1, [facility] FPTS Intern pager: [phone], text pages welcome Secure chat group [facility] Teaching Service

## 2023-07-17 NOTE — Procedures (Signed)
    PROCEDURE SUMMARY:   Successful image-guided lumbar puncture at level of L5/S1 Opening pressure 14 cm water.  Yielded 14 mL of clear  CSF.  No immediate complications.  EBL = trace. Patient tolerated well.    Specimen was sent for labs.   Please see imaging section of Epic for full dictation.   Post procedure orders placed.  - Bedrest with bathroom privileges for 2 hours  - Remain flat in the bed for rest of the day  - Tylenol for headache, up to 4,000 mg a day

## 2023-07-18 ENCOUNTER — Other Ambulatory Visit (HOSPITAL_COMMUNITY): Payer: Self-pay

## 2023-07-18 ENCOUNTER — Other Ambulatory Visit (HOSPITAL_COMMUNITY): Payer: Self-pay | Admitting: *Deleted

## 2023-07-18 DIAGNOSIS — R200 Anesthesia of skin: Secondary | ICD-10-CM | POA: Diagnosis not present

## 2023-07-18 DIAGNOSIS — R202 Paresthesia of skin: Secondary | ICD-10-CM | POA: Diagnosis not present

## 2023-07-18 DIAGNOSIS — D508 Other iron deficiency anemias: Secondary | ICD-10-CM | POA: Diagnosis not present

## 2023-07-18 LAB — COPPER, SERUM: Copper: 89 ug/dL (ref 80–158)

## 2023-07-18 LAB — ZINC: Zinc: 73 ug/dL (ref 44–115)

## 2023-07-18 LAB — GLUCOSE, CAPILLARY
Glucose-Capillary: 118 mg/dL — ABNORMAL HIGH (ref 70–99)
Glucose-Capillary: 164 mg/dL — ABNORMAL HIGH (ref 70–99)

## 2023-07-18 LAB — VDRL, CSF
VDRL Quant, CSF: NONREACTIVE
VDRL Quant, CSF: NONREACTIVE

## 2023-07-18 LAB — VITAMIN B1: Vitamin B1 (Thiamine): 78.1 nmol/L (ref 66.5–200.0)

## 2023-07-18 MED ORDER — POLYETHYLENE GLYCOL 3350 17 G PO PACK: 17.0000 g | PACK | Freq: Every day | ORAL | Status: DC

## 2023-07-18 MED ORDER — CYANOCOBALAMIN 1000 MCG PO TABS: 1000.0000 ug | ORAL_TABLET | Freq: Every day | ORAL | Status: DC

## 2023-07-18 MED ORDER — PANTOPRAZOLE SODIUM 40 MG PO TBEC
40.0000 mg | DELAYED_RELEASE_TABLET | Freq: Every day | ORAL | 0 refills | Status: DC
  Filled 2023-07-18: qty 14, 14d supply, fill #0

## 2023-07-18 MED ORDER — HYDROXYZINE HCL 25 MG PO TABS: 25.0000 mg | ORAL_TABLET | Freq: Once | ORAL | Status: DC

## 2023-07-18 MED ORDER — MELATONIN 3 MG PO TABS: 3.0000 mg | ORAL_TABLET | Freq: Every day | ORAL | Status: DC

## 2023-07-18 MED ORDER — SODIUM CHLORIDE 0.9 % IV SOLN: 1000.0000 mg | Freq: Every day | INTRAVENOUS | Status: DC

## 2023-07-18 NOTE — Assessment & Plan Note (Deleted)
-  GC/chlamydia urine probe negative***

## 2023-07-18 NOTE — Assessment & Plan Note (Deleted)
 Mild abdominal discomfort, moderate colonic stool burden on CTAP overnight. -Daily Miralax

## 2023-07-18 NOTE — Assessment & Plan Note (Deleted)
 Mild normocytic anemia with hemoglobin 11.6.  Low ferritin.  s/p IV iron infusion.

## 2023-07-18 NOTE — Assessment & Plan Note (Deleted)
 Broad differential, though less likely infectious now.  Contrast-enhancing lesion to the dorsal aspect of the upper cervical spinal cord present. -Change vitamin B12 to oral 1000 mcg daily given B12 WNL -f/u RPR, vitamin E, copper, zinc, MMA, neuromyelitis optica autoab, IgG, ACE, ceruloplasmin -Neurology routine consult, appreciate recommendations:  -Pulse dose steroid Solu-Medrol 1 g daily 3-5 days depending on response  -Can discharge tomorrow if response is adequate and continued outpatient infusions  -Protonix 40 mg daily while on steroid  -CBGs TID postprandial -Melatonin for sleep aid

## 2023-07-18 NOTE — Discharge Summary (Signed)
 Family Medicine Teaching [facility] Discharge Summary  Patient name: Melanie Cisneros Medical record number: [id number] Date of birth: [birth year] Age: 33 y.o. Gender: female Date of Admission: 07/15/2023  Date of Discharge: 07/18/2023 Admitting Physician: Tiffany Kocher, DO  Primary Care Provider: Darral Dash, DO Consultants: Neurology  Indication for Hospitalization: Abnormal MRI finding  Discharge Diagnoses/Problem List:  Principal Problem for Admission: Cervical spine lesion Other Problems addressed during stay:  Principal Problem:   Paresthesia Active Problems:   Anemia   Vaginal discharge   Constipation   Brief Hospital Course:  Melanie Cisneros is a 33 y.o.female with a history of gastric bypass and R ulnar neuropathy who was direct admitted from home after MRI w/out contrast showed concern for unclear cervical pathology in setting of progressive paraesthesias and numbness in both RUE and RLE as well as intermittent RLE weakness. Her hospital course is detailed below:  Paresthesias  Abnormal MRI Findings Patient experienced progressive paraesthesias and numbness in both RUE and RLE as well as intermittent subjective RLE weakness.  Outpatient MRI brain, cervical spine, and lumbar spine without contrast outpatient showed patchy enhancement at dorsal upper cervical cord, with no other acute brain abnormalities, unclear etiology.  Neurology consulted and recommended direct admission with Neurology on board for further workup.  Inpatient repeat MRI brain and cervical spine with contrast as well as thoracic w/ and without consistent with previous outpatient MRIs.  Labs showing: B12 and folate WNL, zinc WNL, copper and ceruloplasmin WNL, HIV and RPR negative, Ferritin low at 10. Myelopathy labs pending. Pt was supplemented with vitamin B12 and received iron infusion x1 as below.  ACE and NMO antibodies negative.  Urine MMA pending.  CT chest/abdomen/pelvis obtained and  unremarkable for sarcoidosis or other occult process.  Neurology attempted LP x2, second attempt successful under fluoroscopy showing cell counts WNL, normal protein and glucose, and negative VDRL.  Oligoclonal bands pending.  CSF meningitis/encephalitis panel negative.  Patient was started on empiric IV solu-medrol x3 days for suspected autoimmune disorder while awaiting labs and had excellent response after first dose with improvement of symptoms.  Neurology favors MS diagnosis but awaiting final labs.  She was then discharged in stable condition with plans to follow up with Dr. Epimenio Foot at [facility].  Will continue IV steroids at outpatient infusion clinic on discharge for 1 day.  Anemia Patient had borderline microcytic anemia, normal TIBC and iron, but ferritin of 10.  Received IV Venofer x1 dose to ensure adequate supplementation in setting of gastric bypass.  Discharged on oral iron 325 mg daily.  Other chronic conditions were medically managed with home medications and formulary alternatives as necessary (vitamin supplementation given Hx bariatric surgery, constipation).  PCP Follow-up Recommendations: Ensure follow up with Neurology, appointment scheduled for 07/24/2023 F/u anemia, s/p iron infusion Finishing final steroid infusion on 3/5 outpatient (day 3 of 3), follow up symptom resolution, Neuro strongly considering MS though not officially confirmed yet Patient instructed to resume bariatric vitamins along with vitamin B12 supplements and oral iron 325 mg, ensure compliance Follow up pending labs (urine MMA, IgG CSF index, oligoclonal bands CSF, vitamin E) UA/UTI/STI testing follow up due to vaginal discharge complaint at time of discharge  Disposition: Home with single IV steroid infusion remaining 3/5 AM  Discharge Condition: Stable, symptoms improved with steroid course  Discharge Exam:  Vitals:   07/18/23 0435 07/18/23 0738  BP: (!) 101/56 128/82  Pulse: 81  97  Resp: 18 18  Temp: 98.2 F (  36.8 C) 98.1 F (36.7 C)  SpO2: 100% 100%   Physical Exam: General: Adult female, resting in bed in NAD. Eyes: PERRLA. ENTM: MMM. Neck: Supple, full ROM. Cardiovascular: RRR.  No murmurs/rubs/gallops. Respiratory: Normal WOB on room air. CTAB. Gastrointestinal: No TTP.  Laparoscopic scars well healed. MSK: Moving all extremities appropriately. Extremities: Capillary refill <2 seconds. Derm: No rashes grossly. Psych: Anxious, tearful in setting of new diagnosis.  Stressed and worried.  Pleasant, appropriate.  Neurological examination: MS:  Awake, alert, interactive. Normal eye contact, answered the questions appropriately, speech was fluent, normal comprehension.  Attention and concentration were normal.  Alert to self, place, month, and situation. Cranial nerves:    CN II:  PERRLA.  Visual fields intact to confrontation.  Blinks normally to threat bilaterally. CNs III, IV, VI:  Full extraocular eye movement without nystagmus.  No ptosis or diplopia. CN V:  Facial sensation is normal, no weakness of masticatory muscles.  CN VII:  No facial weakness or asymmetry.  CN VIII:  Auditory acuity grossly normal.  Hearing intact to finger rub bilaterally. CNs XI/X:  Palate elevation symmetric. CN XI:  Normal sternocleidomastoid and trapezius strength. CN XII: Tongue is midline without atrophy or fasciculations. MOTOR:  Strength 5/5 RUE, 5/5 LUE, 5/5 RLE, 5/5 LLE. COORDINATION:  Intact finger-to-nose, no tremor. SENSATION:  Intact to light touch all four extremities, but still mildly decreased in RLE, improved from prior to steroid treatment.  Romberg negative. GAIT:  Normal walk.  Balance normal.  Significant Procedures: LP x2 with results as above  Significant Labs and Imaging:  No results for input(s): "WBC", "HGB", "HCT", "PLT" in the last 48 hours. Recent Labs  Lab 07/17/23 0629  NA 139  K 4.0  CL 108  CO2 23  GLUCOSE 89  BUN 6  CREATININE  0.57  CALCIUM 8.7*   B12 and folate WNL, zinc WNL, copper and ceruloplasmin WNL, HIV and RPR negative, Ferritin low at 10. Myelopathy labs pending. Pt was supplemented with vitamin B12 and received iron infusion x1 as below.  ACE and NMO antibodies negative.  CT chest/abdomen/pelvis: Unremarkable for sarcoidosis or other occult process.  MRI cervical spine, thoracic spine, and head: Patchy enhancement at dorsal upper cervical cord with no other acute brain abnormalities.   Results/Tests Pending at Time of Discharge: Urine MMA, IgG CSF index, oligoclonal bands CSF, vitamin E  Discharge Medications:  Allergies as of 07/18/2023       Reactions   Amoxil [amoxicillin] Anaphylaxis, Hives   Penicillins Anaphylaxis, Hives   Pork Allergy Other (See Comments)   Pt has tolerated multiple doses of heparin        Medication List     TAKE these medications    acetaminophen 650 MG CR tablet Commonly known as: TYLENOL Take 650 mg by mouth 2 (two) times daily as needed for pain.   BARIATRIC MULTIVITAMINS PO Take 1 tablet by mouth daily.   CALCIUM PO Take 1 tablet by mouth daily.   cyanocobalamin 1000 MCG tablet Take 1 tablet (1,000 mcg total) by mouth daily.   IRON PO Take 1 tablet by mouth daily as needed (low iron).   melatonin 3 MG Tabs tablet Take 1 tablet (3 mg total) by mouth at bedtime.   methylPREDNISolone sodium succinate 1,000 mg in sodium chloride 0.9 % 50 mL Inject 1,000 mg into the vein daily for 1 dose. Start taking on: July 19, 2023   pantoprazole 40 MG tablet Commonly known as: PROTONIX Take 1 tablet (  40 mg total) by mouth daily. Start taking on: July 19, 2023   polyethylene glycol 17 g packet Commonly known as: MIRALAX / GLYCOLAX Take 17 g by mouth daily.        Discharge Instructions: Please refer to Patient Instructions section of EMR for full details.  Patient was counseled important signs and symptoms that should prompt return to medical care,  changes in medications, dietary instructions, activity restrictions, and follow up appointments.   Follow-Up Appointments: Future Appointments  Date Time Provider Department Center  07/19/2023  8:00 AM [id number] None  07/21/2023  2:50 PM Levin Erp, MD [facility]  07/24/2023  2:50 PM Nita Sickle K, DO [facility] None  09/05/2023  2:00 PM Theressa Millard, PT [facility]  09/12/2023  2:00 PM Theressa Millard, PT [facility]  09/19/2023  2:00 PM Theressa Millard, PT [facility]  09/26/2023  2:00 PM Theressa Millard, PT [facility]    Sharion Dove, Liliani Bobo, MD 07/18/2023, 3:54 PM PGY-1, [facility]

## 2023-07-18 NOTE — Progress Notes (Signed)
 Discharge instructions (including medications) discussed with and copy provided to patient/caregiver  Patient will leave after her IV solumedrol is complete

## 2023-07-18 NOTE — Progress Notes (Signed)
 NEUROLOGY CONSULT FOLLOW UP NOTE   Date of service: July 18, 2023 Patient Name: Melanie Cisneros MRN:  [id number] DOB:  1991/02/14  Interval Hx/subjective   She feels that her numbness is significantly improved.  Vitals   Vitals:   07/17/23 1627 07/17/23 2039 07/18/23 0435 07/18/23 0738  BP: 107/64 109/69 (!) 101/56 128/82  Pulse: 86 89 81 97  Resp: 18 18 18 18   Temp: 97.6 F (36.4 C) 98 F (36.7 C) 98.2 F (36.8 C) 98.1 F (36.7 C)  TempSrc:  Oral Oral Oral  SpO2: 100% 99% 100% 100%     There is no height or weight on file to calculate BMI.  Physical Exam   Constitutional: Appears well-developed and well-nourished.  Neurologic Examination    MS: Awake, alert, interactive and appropriate CN: Visual fields full, EOMI Motor: She may have very slight hip flexion weakness on the right, otherwise 5/5 Sensory: She has reduced sensation over the ulnar aspect of her right hand as well as on the anterior aspect of her right thigh  Medications  Current Facility-Administered Medications:    acetaminophen (TYLENOL) tablet 650 mg, 650 mg, Oral, Q6H PRN, Shitarev, Dimitry, MD, 650 mg at 07/15/23 1908   cyanocobalamin (VITAMIN B12) tablet 1,000 mcg, 1,000 mcg, Oral, Daily, Erick Alley, DO, 1,000 mcg at 07/18/23 4782   melatonin tablet 3 mg, 3 mg, Oral, QHS, Spence, Sarah, DO, 3 mg at 07/17/23 2117   methylPREDNISolone sodium succinate (SOLU-MEDROL) 1,000 mg in sodium chloride 0.9 % 50 mL IVPB, 1,000 mg, Intravenous, Daily, Shitarev, Dimitry, MD   multivitamin with minerals tablet 1 tablet, 1 tablet, Oral, Daily, Shitarev, Dimitry, MD, 1 tablet at 07/18/23 0949   ondansetron (ZOFRAN-ODT) disintegrating tablet 4 mg, 4 mg, Oral, Q8H PRN, Tiffany Kocher, DO, 4 mg at 07/17/23 2117   pantoprazole (PROTONIX) EC tablet 40 mg, 40 mg, Oral, Daily, Shitarev, Dimitry, MD, 40 mg at 07/18/23 0949   polyethylene glycol (MIRALAX / GLYCOLAX) packet 17 g, 17 g, Oral, Daily, Cyndia Skeeters, DO, 17 g  at 07/18/23 0949  Labs and Diagnostic Imaging   Angiotensin-converting enzyme-normal at 40 Ceruloplasmin-19 B12 442 Zinc - 73 nml Folate 27.8 nml Copper 89 - nml Vit E - pending  Anti-Mog - negative NMO -negative RPR - negative HIV - negative  CSF WBC - 6, 8 CSF RBC - 1, 28 CSF protein - 18 CSF glucose 52 OC bands pending IgG index - pending  Imaging(Personally reviewed): MRI C-spine - enhancing posterior T2 lesion  Assessment   Melanie Cisneros is a 33 y.o. 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  She will need to be followed up as an outpatient and a referral has been made to go for neurology.  With her improvement already, I think 3 days of IV Solu-Medrol will be adequate  I discussed this with the patient and answered both her and her family member's questions.  Recommendations  Solu-Medrol 1 g daily for 3 days (today is day two) Neurology will be available as needed moving forward. ______________________________________________________________________   Signed, Ritta Slot, MD [facility]

## 2023-07-19 ENCOUNTER — Ambulatory Visit (HOSPITAL_COMMUNITY)
Admission: RE | Admit: 2023-07-19 | Discharge: 2023-07-19 | Disposition: A | Discharge status: Home / Self Care | Source: Ambulatory Visit | Attending: Family Medicine | Admitting: Family Medicine

## 2023-07-19 DIAGNOSIS — G373 Acute transverse myelitis in demyelinating disease of central nervous system: Secondary | ICD-10-CM | POA: Diagnosis not present

## 2023-07-19 LAB — IGG CSF INDEX
Albumin CSF-mCnc: 11 mg/dL (ref 7–29)
Albumin: 3.6 g/dL — ABNORMAL LOW (ref 3.9–4.9)
CSF IgG Index: 0.9 — ABNORMAL HIGH (ref 0.0–0.7)
IgG (Immunoglobin G), Serum: 981 mg/dL (ref 586–1602)
IgG, CSF: 2.8 mg/dL (ref 0.0–6.7)
IgG/Alb Ratio, CSF: 0.25 (ref 0.00–0.25)

## 2023-07-19 LAB — VITAMIN E
Vitamin E (Alpha Tocopherol): 7.8 mg/L (ref 5.9–19.4)
Vitamin E(Gamma Tocopherol): 0.6 mg/L — ABNORMAL LOW (ref 0.7–4.9)

## 2023-07-19 MED ORDER — METHYLPREDNISOLONE SODIUM SUCC 1000 MG IJ SOLR
1000.0000 mg | Freq: Once | INTRAMUSCULAR | Status: AC
  Administered 2023-07-19: 1000 mg via INTRAVENOUS
  Filled 2023-07-19: qty 16

## 2023-07-20 ENCOUNTER — Emergency Department (HOSPITAL_COMMUNITY)

## 2023-07-20 ENCOUNTER — Encounter: Payer: Self-pay | Admitting: Family Medicine

## 2023-07-20 ENCOUNTER — Other Ambulatory Visit: Payer: Self-pay

## 2023-07-20 ENCOUNTER — Emergency Department (HOSPITAL_COMMUNITY)
Admission: EM | Admit: 2023-07-20 | Discharge: 2023-07-20 | Disposition: A | Discharge status: Home / Self Care | Attending: Emergency Medicine | Admitting: Emergency Medicine

## 2023-07-20 DIAGNOSIS — G4489 Other headache syndrome: Secondary | ICD-10-CM | POA: Diagnosis not present

## 2023-07-20 DIAGNOSIS — M542 Cervicalgia: Secondary | ICD-10-CM | POA: Diagnosis not present

## 2023-07-20 DIAGNOSIS — G379 Demyelinating disease of central nervous system, unspecified: Secondary | ICD-10-CM | POA: Diagnosis not present

## 2023-07-20 DIAGNOSIS — R519 Headache, unspecified: Secondary | ICD-10-CM | POA: Insufficient documentation

## 2023-07-20 DIAGNOSIS — R001 Bradycardia, unspecified: Secondary | ICD-10-CM | POA: Diagnosis not present

## 2023-07-20 DIAGNOSIS — R110 Nausea: Secondary | ICD-10-CM | POA: Diagnosis not present

## 2023-07-20 DIAGNOSIS — R9431 Abnormal electrocardiogram [ECG] [EKG]: Secondary | ICD-10-CM | POA: Diagnosis not present

## 2023-07-20 DIAGNOSIS — R531 Weakness: Secondary | ICD-10-CM | POA: Insufficient documentation

## 2023-07-20 LAB — WET PREP, GENITAL
Clue Cells Wet Prep HPF POC: NONE SEEN
Sperm: NONE SEEN
Trich, Wet Prep: NONE SEEN
WBC, Wet Prep HPF POC: <10 (ref <10)
Yeast Wet Prep HPF POC: NONE SEEN

## 2023-07-20 LAB — CBC WITH DIFFERENTIAL/PLATELET
Abs Immature Granulocytes: 0.06 10*3/uL (ref 0.00–0.07)
Basophils Absolute: 0 10*3/uL (ref 0.0–0.1)
Basophils Relative: 0 %
Eosinophils Absolute: 0 10*3/uL (ref 0.0–0.5)
Eosinophils Relative: 0 %
HCT: 39 % (ref 36.0–46.0)
Hemoglobin: 12.8 g/dL (ref 12.0–15.0)
Immature Granulocytes: 0 %
Lymphocytes Relative: 22 %
Lymphs Abs: 3.3 10*3/uL (ref 0.7–4.0)
MCH: 28 pg (ref 26.0–34.0)
MCHC: 32.8 g/dL (ref 30.0–36.0)
MCV: 85.3 fL (ref 80.0–100.0)
Monocytes Absolute: 1.1 10*3/uL — ABNORMAL HIGH (ref 0.1–1.0)
Monocytes Relative: 7 %
Neutro Abs: 10.8 10*3/uL — ABNORMAL HIGH (ref 1.7–7.7)
Neutrophils Relative %: 71 %
Platelets: 315 10*3/uL (ref 150–400)
RBC: 4.57 MIL/uL (ref 3.87–5.11)
RDW: 14.1 % (ref 11.5–15.5)
WBC: 15.3 10*3/uL — ABNORMAL HIGH (ref 4.0–10.5)
nRBC: 0 % (ref 0.0–0.2)

## 2023-07-20 LAB — COMPREHENSIVE METABOLIC PANEL
ALT: 24 U/L (ref 0–44)
AST: 22 U/L (ref 15–41)
Albumin: 3.8 g/dL (ref 3.5–5.0)
Alkaline Phosphatase: 90 U/L (ref 38–126)
Anion gap: 14 (ref 5–15)
BUN: 13 mg/dL (ref 6–20)
CO2: 25 mmol/L (ref 22–32)
Calcium: 9 mg/dL (ref 8.9–10.3)
Chloride: 103 mmol/L (ref 98–111)
Creatinine, Ser: 0.78 mg/dL (ref 0.44–1.00)
GFR, Estimated: >60 mL/min (ref >60)
Glucose, Bld: 90 mg/dL (ref 70–99)
Potassium: 3.3 mmol/L — ABNORMAL LOW (ref 3.5–5.1)
Sodium: 142 mmol/L (ref 135–145)
Total Bilirubin: 0.7 mg/dL (ref 0.0–1.2)
Total Protein: 6.7 g/dL (ref 6.5–8.1)

## 2023-07-20 LAB — URINALYSIS, ROUTINE W REFLEX MICROSCOPIC
Bilirubin Urine: NEGATIVE
Glucose, UA: NEGATIVE mg/dL
Hgb urine dipstick: NEGATIVE
Ketones, ur: NEGATIVE mg/dL
Leukocytes,Ua: NEGATIVE
Nitrite: NEGATIVE
Protein, ur: NEGATIVE mg/dL
Specific Gravity, Urine: 1.016 (ref 1.005–1.030)
pH: 5 (ref 5.0–8.0)

## 2023-07-20 LAB — TROPONIN I (HIGH SENSITIVITY): Troponin I (High Sensitivity): 4 ng/L (ref <18)

## 2023-07-20 LAB — PREGNANCY, URINE: Preg Test, Ur: NEGATIVE

## 2023-07-20 MED ORDER — ACETAMINOPHEN 500 MG PO TABS
1000.0000 mg | ORAL_TABLET | Freq: Once | ORAL | Status: AC
  Administered 2023-07-20: 1000 mg via ORAL
  Filled 2023-07-20: qty 2

## 2023-07-20 MED ORDER — DIPHENHYDRAMINE HCL 50 MG/ML IJ SOLN
25.0000 mg | Freq: Once | INTRAMUSCULAR | Status: AC
  Administered 2023-07-20: 25 mg via INTRAVENOUS
  Filled 2023-07-20: qty 1

## 2023-07-20 MED ORDER — PROCHLORPERAZINE EDISYLATE 10 MG/2ML IJ SOLN
10.0000 mg | Freq: Once | INTRAMUSCULAR | Status: AC
  Administered 2023-07-20: 10 mg via INTRAVENOUS
  Filled 2023-07-20: qty 2

## 2023-07-20 MED ORDER — SODIUM CHLORIDE 0.9 % IV BOLUS
1000.0000 mL | Freq: Once | INTRAVENOUS | Status: AC
  Administered 2023-07-20: 1000 mL via INTRAVENOUS

## 2023-07-20 MED ORDER — DEXAMETHASONE SODIUM PHOSPHATE 10 MG/ML IJ SOLN
10.0000 mg | Freq: Once | INTRAMUSCULAR | Status: AC
  Administered 2023-07-20: 10 mg via INTRAVENOUS
  Filled 2023-07-20: qty 1

## 2023-07-20 NOTE — ED Provider Notes (Signed)
 [place] EMERGENCY DEPARTMENT AT [facility] Provider Note   CSN: [id number] Arrival date & time: 07/20/23  1040     History  Chief Complaint  Patient presents with   Headache    Melanie Cisneros is a 33 y.o. female.  Melanie Cisneros is a 33 y.o. female with a history of recent neurologic symptoms and workup for multiple sclerosis who presents to the emergency department for evaluation of headache and neck pain.  Patient reports that after recent hospitalization for MRIs, lumbar puncture and IV steroid infusion with working diagnosis of multiple sclerosis patient began experiencing headache.  She describes headache as generalized and present throughout the head with some neck pain as well.  She reports that the headache is constant, nothing makes it better or worse.  She denies any changes in headache with postural changes and specifically reports that it is not improved with laying flat and not worse when standing or changing positions.  She denies any associated fevers or chills, no low back pain.  She does report some sensitivity to light and sound with some associated nausea but no vomiting.  She has had some issues with right-sided weakness over the past 2 months that spurred the evaluation for multiple sclerosis but denies any new weakness or numbness since headaches began.  She does report improvement in neurologic symptoms that she was experiencing with steroid infusion and received her last infusion yesterday without complication.  Patient also incidentally notes some vaginal discharge and is requesting swab for yeast versus BV.  She denies any pelvic pain or vaginal bleeding.  No urinary symptoms.  The history is provided by the patient and medical records.  Headache Associated symptoms: nausea and photophobia   Associated symptoms: no eye pain, no fever and no vomiting        Home Medications Prior to Admission medications   Medication Sig Start Date End Date  Taking? Authorizing Provider  cyanocobalamin 1000 MCG tablet Take 1 tablet (1,000 mcg total) by mouth daily. 07/18/23   Shitarev, Dimitry, MD  Ferrous Sulfate (IRON PO) Take 1 tablet by mouth daily as needed (low iron).    [provider]  melatonin 3 MG TABS tablet Take 1 tablet (3 mg total) by mouth at bedtime. 07/18/23   Shitarev, Dimitry, MD  Multiple Vitamins-Minerals (BARIATRIC MULTIVITAMINS PO) Take 1 tablet by mouth daily.    [provider]      Allergies    Amoxil [amoxicillin], Penicillins, and Pork allergy    Review of Systems   Review of Systems  Constitutional:  Negative for chills and fever.  Eyes:  Positive for photophobia. Negative for pain and visual disturbance.  Gastrointestinal:  Positive for nausea. Negative for vomiting.  Genitourinary:  Positive for vaginal discharge.  Neurological:  Positive for headaches.  All other systems reviewed and are negative.   Physical Exam Updated Vital Signs BP 108/70   Pulse (!) 57   Temp 98.1 F (36.7 C) (Oral)   Resp 17   Ht 5\' 5"  (1.651 m)   Wt 70 kg   SpO2 100%   BMI 25.68 kg/m  Physical Exam Vitals and nursing note reviewed.  Constitutional:      General: She is not in acute distress.    Appearance: Normal appearance. She is well-developed. She is not ill-appearing or diaphoretic.  HENT:     Head: Normocephalic and atraumatic.  Eyes:     General:        Right eye:  No discharge.        Left eye: No discharge.     Pupils: Pupils are equal, round, and reactive to light.  Cardiovascular:     Rate and Rhythm: Normal rate and regular rhythm.     Pulses: Normal pulses.     Heart sounds: Normal heart sounds.  Pulmonary:     Effort: Pulmonary effort is normal. No respiratory distress.     Breath sounds: Normal breath sounds. No wheezing or rales.     Comments: Respirations equal and unlabored, patient able to speak in full sentences, lungs clear to auscultation bilaterally  Abdominal:     General:  Bowel sounds are normal.  Musculoskeletal:        General: No deformity.     Cervical back: Neck supple.  Skin:    General: Skin is warm and dry.     Capillary Refill: Capillary refill takes less than 2 seconds.  Neurological:     Mental Status: She is alert and oriented to person, place, and time.     GCS: GCS eye subscore is 4. GCS verbal subscore is 5. GCS motor subscore is 6.     Coordination: Coordination normal.     Comments: Neurological Exam:  Mental Status: Alert and oriented to person, place, and time. Speech clear. Follows commands. Cranial Nerves: Visual fields grossly intact. EOMI and PERRLA. No nystagmus noted. Facial sensation intact at forehead, maxillary cheek, and chin/mandible bilaterally. No facial asymmetry or weakness. Hearing grossly normal. Uvula is midline, and palate elevates symmetrically. Normal SCM and trapezius strength. Tongue midline without fasciculations. Motor: Muscle strength 5/5 in proximal and distal UE and LE bilaterally. No pronator drift. Muscle tone normal. Sensation: Intact to light touch in upper and lower extremities distally bilaterally.  Coordination: Normal FTN bilaterally.   Psychiatric:        Mood and Affect: Mood normal.        Behavior: Behavior normal.     ED Results / Procedures / Treatments   Labs (all labs ordered are listed, but only abnormal results are displayed) Labs Reviewed  CBC WITH DIFFERENTIAL/PLATELET - Abnormal; Notable for the following components:      Result Value   WBC 15.3 (*)    Neutro Abs 10.8 (*)    Monocytes Absolute 1.1 (*)    All other components within normal limits  COMPREHENSIVE METABOLIC PANEL - Abnormal; Notable for the following components:   Potassium 3.3 (*)    All other components within normal limits  URINALYSIS, ROUTINE W REFLEX MICROSCOPIC - Abnormal; Notable for the following components:   APPearance HAZY (*)    All other components within normal limits  WET PREP, GENITAL  PREGNANCY,  URINE  TROPONIN I (HIGH SENSITIVITY)  TROPONIN I (HIGH SENSITIVITY)    EKG EKG Interpretation Date/Time:  Thursday July 20 2023 10:45:10 EST Ventricular Rate:  49 PR Interval:  138 QRS Duration:  78 QT Interval:  440 QTC Calculation: 397 R Axis:   70  Text Interpretation: Sinus bradycardia Otherwise normal ECG When compared with ECG of 09-Dec-2022 13:28, PREVIOUS ECG IS PRESENT Confirmed by Alvester Chou [phone]) on 07/20/2023 10:56:38 AM  Radiology CT Head Wo Contrast Result Date: 07/20/2023 CLINICAL DATA:  33 year old female with headache and neck pain. Currently undergoing treatment for demyelinating disease. EXAM: CT HEAD WITHOUT CONTRAST TECHNIQUE: Contiguous axial images were obtained from the base of the skull through the vertex without intravenous contrast. RADIATION DOSE REDUCTION: This exam was performed according to the departmental  dose-optimization program which includes automated exposure control, adjustment of the mA and/or kV according to patient size and/or use of iterative reconstruction technique. COMPARISON:  Brain and total spine MRI recently, 07/16/2023 and earlier. No prior head CT. FINDINGS: Brain: Cerebral volume is within normal limits. No midline shift, ventriculomegaly, mass effect, evidence of mass lesion, intracranial hemorrhage or evidence of cortically based acute infarction. Gray-white matter differentiation is within normal limits throughout the brain. No CT abnormality is identified at the dorsal cervicomedullary junction site of recent MRI abnormality. Vascular: No suspicious intracranial vascular hyperdensity. Skull: Intact, negative. Sinuses/Orbits: Visualized paranasal sinuses and mastoids are clear. Other: Visualized orbits and scalp soft tissues are within normal limits. IMPRESSION: Normal noncontrast Head CT. No CT abnormality is identified at the dorsal cervicomedullary junction site of recent MRI abnormality. Electronically Signed   By: Odessa Fleming M.D.   On:  07/20/2023 12:43    Procedures Procedures    Medications Ordered in ED Medications  acetaminophen (TYLENOL) tablet 1,000 mg (1,000 mg Oral Given 07/20/23 1251)  sodium chloride 0.9 % bolus 1,000 mL (0 mLs Intravenous Stopped 07/20/23 1344)  dexamethasone (DECADRON) injection 10 mg (10 mg Intravenous Given 07/20/23 1255)  prochlorperazine (COMPAZINE) injection 10 mg (10 mg Intravenous Given 07/20/23 1259)  diphenhydrAMINE (BENADRYL) injection 25 mg (25 mg Intravenous Given 07/20/23 1256)    ED Course/ Medical Decision Making/ A&P                                Medical Decision Making Amount and/or Complexity of Data Reviewed Labs: ordered. Decision-making details documented in ED Course. Radiology: ordered.  Risk Prescription drug management.   33 year old female presents with diffuse headache with some associated photophobia and nausea.  Patient has had recent evaluation for MS with MRIs, lumbar puncture and has had significant improvement in symptoms with steroids, then yesterday developed this headache.  Headache is not worse with position change per patient.  Not improved with laying flat.  Differential includes: Migraine headache, spinal headache after recent LP, intracranial hemorrhage, tension headache, rebound headache  Case discussed with Dr. Uvaldo Rising for Luisa Hart, without positional worsening of headache, less likely related to CSF leak or spinal headache.  Will obtain lab work and CT head and treat with migraine cocktail.  When initially in triage patient had reported some chest pain, troponins and EKG of change which are on remarkable.  Mild leukocytosis likely in the setting of recent steroid infusion, potassium of 3.3, encouraged dietary replacement, no other electrolyte derangements, urinalysis without evidence of infection.  Patient requested self swab for wet prep which is pending and she will follow-up on results via MyChart.  CT of the head with no acute intracranial  abnormality.  On reassessment after IV headache cocktail patient reports that headache has resolved and she is requesting to be discharged home  At this time there does not appear to be any evidence of an acute emergency medical condition requiring further emergent evaluation and the patient appears stable for discharge with appropriate outpatient follow up. Diagnosis and return precautions discussed with patient who verbalizes understanding and is agreeable to discharge.           Final Clinical Impression(s) / ED Diagnoses Final diagnoses:  Bad headache    Rx / DC Orders ED Discharge Orders     None         Velda Shell 07/26/23 2321    Alvester Chou  J, MD 07/27/23 [phone]

## 2023-07-20 NOTE — ED Triage Notes (Addendum)
 Pt BIBGEMS from work for headache and neck pain. Being worked up for [employer]. Pt had infusions yesterday, increased weakness on right side x 2 months.  HR - 40s-50s BP - 116/72 CBG 111

## 2023-07-20 NOTE — Discharge Instructions (Addendum)
 Your CT scan and lab work was overall reassuring.  Your let wet prep is pending and you can follow-up these results online via MyChart or with your primary care provider.  Continue to follow-up with neurology and primary care as planned.  Headache today seems like it was most likely a migraine headache but if you have headaches return or worsen please return to the emergency department for reevaluation.

## 2023-07-21 ENCOUNTER — Ambulatory Visit (INDEPENDENT_AMBULATORY_CARE_PROVIDER_SITE_OTHER): Payer: Self-pay | Admitting: Student

## 2023-07-21 ENCOUNTER — Encounter: Payer: Self-pay | Admitting: Student

## 2023-07-21 VITALS — BP 118/68 | HR 73 | Ht 65.0 in | Wt 159.1 lb

## 2023-07-21 DIAGNOSIS — G971 Other reaction to spinal and lumbar puncture: Secondary | ICD-10-CM

## 2023-07-21 MED ORDER — HYDROCODONE-ACETAMINOPHEN 10-325 MG PO TABS: 1.0000 | ORAL_TABLET | Freq: Three times a day (TID) | ORAL | 0 refills | Status: DC | PRN

## 2023-07-21 NOTE — Patient Instructions (Addendum)
 It was great to see you! Thank you for allowing me to participate in your care!   Our plans for today:  - I called IR and have left a message for them to return my call for a potential blood patch for spinal headache - resume bariatric vitamins along with vitamin B12 supplements and oral iron 325  - I am sending in small dose of narcotics for you until neurology appointment Monday - go to ED if worsening pain, sided weakness, vomiting, vision changes, fevers Take care and seek immediate care sooner if you develop any concerns.  Levin Erp, MD

## 2023-07-21 NOTE — Progress Notes (Signed)
    SUBJECTIVE:   CHIEF COMPLAINT / HPI: Hospital fu  Recent hospital admission 3/1 and 3/4 for paresthesias, neurology performed LP with negative meningitis panel but oligoclonal bands pending.  They favor MS diagnosis but awaiting labs.  Since discharge- Persistent headaches that started after discharge from hospital. The headaches are described as positional, worsening upon sitting up. She has been managing the pain with Tylenol. No acute vision changes (chronic blurry vision over past few months). No fevers.  She also experiences intermittent numbness in the face, particularly upon waking up in the morning, tingling in the lower back, hands, and feets.  Went to ED yesterday 3/6 for the headache-got CT head which was negative. Got additional steroid injection. WBC 15.3 likely in setting of steroids.   PCP Follow-up Recommendations: Ensure follow up with Neurology, appointment scheduled for 07/24/2023 patient aware F/u anemia, s/p iron infusion - CBC in ED with stable Hgb Finishing final steroid infusion on 3/5 outpatient (day 3 of 3), follow up symptom resolution, Neuro strongly considering MS though not officially confirmed yet - Got additional dose on 3/6 in ED Patient instructed to resume bariatric vitamins along with vitamin B12 supplements and oral iron 325 mg, ensure compliance - Discussed Follow up pending labs (urine MMA, IgG CSF index, oligoclonal bands CSF, vitamin E) -Pending UA/UTI/STI testing follow up due to vaginal discharge complaint at time of discharge - Not discussed today (wet prep and UA in ED negative)  PERTINENT  PMH / PSH: gastric bypass and R ulnar neuropathy   OBJECTIVE:   BP 118/68   Pulse 73   Ht 5\' 5"  (1.651 m)   Wt 159 lb 2 oz (72.2 kg)   LMP 07/03/2023   SpO2 100%   BMI 26.48 kg/m   General: Awake, alert, responsive to questions Head: Normocephalic atraumatic CV: Regular rate and rhythm no murmurs rubs or gallops Respiratory: Clear to  ausculation bilaterally, no wheezes rales or crackles, chest rises symmetrically,  no increased work of breathing  Extremities: Moves upper and lower extremities freely, no edema in LE Neuro: CN II: PERRL CN III, IV,VI: EOMI CV V: Normal sensation in V1, V2, V3 CVII: Symmetric smile and brow raise CN VIII: Normal hearing CN IX,X: Symmetric palate raise  CN XI: 5/5 shoulder shrug CN XII: Symmetric tongue protrusion  UE and LE strength 5/5 Normal sensation in UE and LE bilaterally  No ataxia with finger to nose  ASSESSMENT/PLAN:   Assessment & Plan Spinal headache Most consistent with spinal headache after LP given positional changes. Neurologic exam intact CT Head from yesterday negative for acute changes. I called multiple people in IR (impatient and outpatient) and unable to schedule patient for a blood patch today.  -Neurology follow up on Monday -Short Norco course for severe headache provided -Strict ED/return precautions discussed and placed on AVS -F/u hospital labs   Levin Erp, MD [facility]

## 2023-07-24 ENCOUNTER — Ambulatory Visit (INDEPENDENT_AMBULATORY_CARE_PROVIDER_SITE_OTHER): Payer: Medicaid Other | Admitting: Neurology

## 2023-07-24 ENCOUNTER — Encounter: Payer: Self-pay | Admitting: Neurology

## 2023-07-24 VITALS — BP 114/64 | HR 86 | Ht 65.0 in | Wt 165.0 lb

## 2023-07-24 DIAGNOSIS — G0491 Myelitis, unspecified: Secondary | ICD-10-CM

## 2023-07-24 MED ORDER — CYCLOBENZAPRINE HCL 5 MG PO TABS: 5.0000 mg | ORAL_TABLET | Freq: Every evening | ORAL | 1 refills | Status: DC | PRN

## 2023-07-24 NOTE — Progress Notes (Unsigned)
 [facility] Clinic Note - Initial Visit   Date: 07/24/2023   DIOSELINA BRUMBAUGH MRN: [id number] DOB: 02/27/1991   Dear Dr. McDiarmid:  Thank you for your kind referral of Estela DEJANA PUGSLEY for consultation of parethesias. Although her history is well known to you, please allow [place] to reiterate it for the purpose of our medical record. The patient was accompanied to the clinic by mother and daughter who also provides collateral information.     Yazmin D Villalona is a 33 y.o. 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  CSF testing shows normal cells count with elevate IgG.  If oligoclonal bands return positive, then this favors clinically isolated syndrome and then next step is to discuss disease modifying therapies.  If normal, then I will plan to repeat imaging in 3 months to look for interval change.  Patient was asked to monitor her labs and notify me when her oligoclonal bands results so we can decide the next step.   ------------------------------------------------------------- History of present illness: In January, she began having right leg numbness and weakness and gets vibration sensation in the right leg and twinges of pain in the back.  She also started having numbness involving the right 4th and 5th fingers.  She saw her PCP for these symptoms, who ordered MRI brain and cervical spine.  Imaging shows T2 hyperintensity involving the dorsal upper cervical cord extending to the cervicomedullary junction.  Repeat imaging with contrast showed mild patchy enhancement. MRI brain was normal and MRI thoracic spine showed normal cord signal.  She underwent CSF testing which shows normal cell count, protein, and glucose.  IgG index is elevated 0.9.  Oligoclonal bands are pending.  Labs  including ESR, vitamin B12, folate, vitamin E, copper, zinc, ACE, RPR, and HIV was normal. Anti-MOG and NMO is negative.  Due to concern of an inflammatory process, she was administered IV methylprednisolone 1g x 3 days.  Overall, she feels that her right leg weakness and twinges of pain has improved.  She still has vibration sensation in the thigh and no longer has numbness in the right hand.   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  Nonsmoker.  No drug or alcohol use.     Out-side paper records, electronic medical record, and images have been reviewed where available and summarized as:  CSF 07/16/2023:  R1 W6 G52, P18, IgG index 0.9*, OCB pending AntiMOG, NMO - neg Labs 07/16/2023:  vitamin B1 78, ceruloplasmin 19, vitamin E 7.8, HIV neg, RPR neg, vitamin B12 442, folate 27, copper, zinc 73  MRI thoracic spine wo contrast 07/16/2023: 1. Normal MRI appearance of the thoracic spinal cord. No abnormal enhancement. 2. Minor spondylosis at T4-5 through T7-8 without significant stenosis or neural impingement as above.   MRI cervical spine 07/16/2023: 1. Patchy post-contrast enhancement at the dorsal aspect of the upper cervical cord at the level of the cervicomedullary junction, corresponding with signal abnormality seen on prior noncontrast MRI. Differential considerations as previously described. 2. Otherwise unremarkable and normal post-contrast MRI of the cervical spine and spinal cord.  MRI cervical spine  07/14/2023: 1. T2 hyperintense signal abnormality within the dorsal aspect of the upper cervical spinal cord (at the C2 level) and extending to the cervicomedullary junction, spanning 2.6 cm in craniocaudal dimension. Differential considerations include inflammatory demyelination (MS, NMO), subacute combined degeneration,  copper deficiency, sequela viral infection and syphilis/tabes dorsalis, among others. Post-contrast MR imaging of the cervical spine is recommended for further evaluation. An MRI  of the thoracic spine (with and without contrast) should also be considered. 2. Cervical spondylosis as outlined within the body of the report. No more than mild relative spinal canal narrowing. No significant foraminal stenosis. Disc degeneration is greatest at C6-C7 (moderate at this level).  MRI brain 07/16/2023:  No abnormal enhancement within the brain itself.   Lab Results  Component Value Date   VITAMINB12 442 07/15/2023   Lab Results  Component Value Date   TSH 0.626 12/09/2022   No results found for: "ESRSEDRATE", "POCTSEDRATE"  Past Medical History:  Diagnosis Date   Bacterial vaginosis    BMI 45.0-49.9, adult (HCC)    Chlamydia    Dizziness    Facial numbness    Fullness in head    Gonorrhea    History of gastric bypass    Hypokalemia    Lightheadedness    Obesity    Ovarian cyst    Palpitations    Psychosocial stressors    Ptyalism    Rubella non-immune status, antepartum    UTI (lower urinary tract infection)     Past Surgical History:  Procedure Laterality Date   GASTRIC BYPASS     WISDOM TOOTH EXTRACTION       Medications:  Outpatient Encounter Medications as of 07/24/2023  Medication Sig   cyanocobalamin 1000 MCG tablet Take 1 tablet (1,000 mcg total) by mouth daily.   cyclobenzaprine (FLEXERIL) 5 MG tablet Take 1 tablet (5 mg total) by mouth at bedtime as needed for muscle spasms.   Ferrous Sulfate (IRON PO) Take 1 tablet by mouth daily as needed (low iron).   Multiple Vitamins-Minerals (BARIATRIC MULTIVITAMINS PO) Take 1 tablet by mouth daily.   [DISCONTINUED] HYDROcodone-acetaminophen (NORCO) 10-325 MG tablet Take 1 tablet by mouth every 8 (eight) hours as needed for up to 3 days. (Patient not taking: Reported on 07/24/2023)   [DISCONTINUED] melatonin 3 MG TABS tablet Take 1 tablet (3 mg total) by mouth at bedtime. (Patient not taking: Reported on 07/24/2023)   No facility-administered encounter medications on file as of 07/24/2023.     Allergies:  Allergies  Allergen Reactions   Amoxil [Amoxicillin] Anaphylaxis and Hives   Penicillins Anaphylaxis and Hives   Pork Allergy Other (See Comments)    Pt has tolerated multiple doses of heparin    Family History: Family History  Problem Relation Age of Onset   Diabetes Father    Hypertension Mother     Social History: Social History   Tobacco Use   Smoking status: Never   Smokeless tobacco: Never  Vaping Use   Vaping status: Never Used  Substance Use Topics   Alcohol use: Not Currently   Drug use: No   Social History   Social History Narrative   Are you right handed or left handed? Right Handed    Are you currently employed ? 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 No   Who lives with you? Mom and daughter    What type of home do you live in: 1 story or 2 story? One story home         Vital Signs:  BP 114/64   Pulse 86   Ht 5\' 5"  (1.651 m)   Wt 165 lb (74.8 kg)   LMP 07/03/2023  SpO2 99%   BMI 27.46 kg/m    Neurological Exam: MENTAL STATUS including orientation to time, place, person, recent and remote memory, attention span and concentration, language, and fund of knowledge is normal.  Speech is not dysarthric.  CRANIAL NERVES: II:  No visual field defects.     III-IV-VI: Pupils equal round and reactive to light.  Normal conjugate, extra-ocular eye movements in all directions of gaze.  No nystagmus.  No ptosis.   V:  Normal facial sensation.    VII:  Normal facial symmetry and movements.   VIII:  Normal hearing and vestibular function.   IX-X:  Normal palatal movement.   XI:  Normal shoulder shrug and head rotation.   XII:  Normal tongue strength and range of motion, no deviation or fasciculation.  MOTOR:  No atrophy, fasciculations or abnormal movements.  No pronator drift.   Upper Extremity:  Right  Left  Deltoid  5/5   5/5   Biceps  5/5   5/5   Triceps  5/5   5/5   Wrist  extensors  5/5   5/5   Wrist flexors  5/5   5/5   Finger extensors  5/5   5/5   Finger flexors  5/5   5/5   Dorsal interossei  5/5   5/5   Abductor pollicis  5/5   5/5   Tone (Ashworth scale)  0  0   Lower Extremity:  Right  Left  Hip flexors  5/5   5/5   Knee flexors  5/5   5/5   Knee extensors  5/5   5/5   Dorsiflexors  5/5   5/5   Plantarflexors  5/5   5/5   Toe extensors  5/5   5/5   Toe flexors  5/5   5/5   Tone (Ashworth scale)  0  0   MSRs:                                           Right        Left brachioradialis 2+  2+  biceps 2+  2+  triceps 2+  2+  patellar 1+  1+  ankle jerk 2+  2+  Hoffman no  no  plantar response down  down   SENSORY:  Normal and symmetric perception of light touch, pinprick, vibration, and temperature.  Romberg's sign absent.   COORDINATION/GAIT: Normal finger-to- nose-finger.  Intact rapid alternating movements bilaterally.  Gait narrow based and stable. Tandem and stressed gait intact.   Total time spent reviewing records, interview, history/exam, documentation, and coordination of care on day of encounter: 50  minutes    Thank you for allowing me to participate in patient's care.  If I can answer any additional questions, I would be pleased to do so.    Sincerely,    Terressa Evola K. Allena Katz, DO

## 2023-07-24 NOTE — Patient Instructions (Addendum)
 Please check on MyChart for oligoclonal bands results. Once you see this, please send me a MyChart message.  Start flexeril 5mg  at bedtime as needed for low back pain

## 2023-07-27 LAB — OLIGOCLONAL BANDS, CSF + SERM

## 2023-08-02 ENCOUNTER — Telehealth: Payer: Self-pay | Admitting: Family Medicine

## 2023-08-02 ENCOUNTER — Encounter: Payer: Self-pay | Admitting: Neurology

## 2023-08-02 DIAGNOSIS — K219 Gastro-esophageal reflux disease without esophagitis: Secondary | ICD-10-CM

## 2023-08-02 MED ORDER — OMEPRAZOLE 20 MG PO CPDR
20.0000 mg | DELAYED_RELEASE_CAPSULE | Freq: Every day | ORAL | 0 refills | Status: DC
Start: 2023-08-02 — End: 2023-08-28

## 2023-08-02 NOTE — Telephone Encounter (Signed)
-----   Message from Janit Pagan sent at 07/27/2023  4:41 PM EDT -----  ----- Message ----- From: Interface, [facility] In [facility] Sent: 07/17/2023  12:38 PM EDT To: Doreene Eland, MD

## 2023-08-02 NOTE — Telephone Encounter (Signed)
 Called patient, verified DOB.  Discussed oligoclonal bands in CSF with paired serum bands; inconclusive for MS though may indicate inflammatory response outside CNS.  Defer to Neurology for follow up.  Per Neurology outpatient follow up note 3/10: "If oligoclonal bands return positive, then this favors clinically isolated syndrome and then next step is to discuss disease modifying therapies.  If normal, then I will plan to repeat imaging in 3 months to look for interval change."  Instructed patient to contact Neurology to schedule follow up and discuss further.  Patient also complaining of severe GERD-like symptoms since receiving IV steroids, will order 1 month of omeprazole 20 mg daily.  Instructed her to return to our clinic if symptoms worsen or do not improve by 1 month out.

## 2023-08-03 ENCOUNTER — Telehealth: Payer: Self-pay | Admitting: Neurology

## 2023-08-03 NOTE — Telephone Encounter (Signed)
 Patient called to let patel know the results are in from her spinal tap. She is scheduled for 3/26. She is concerned and would like to be called back

## 2023-08-03 NOTE — Telephone Encounter (Signed)
 Patient is returning a call to [facility], please call patient back if she does not answer please leave a VM

## 2023-08-03 NOTE — Telephone Encounter (Signed)
 See Mychart message. Patient had been contacted back.

## 2023-08-03 NOTE — Telephone Encounter (Signed)
 Left message on machine for patient to call back.

## 2023-08-09 ENCOUNTER — Encounter: Payer: Self-pay | Admitting: Neurology

## 2023-08-09 ENCOUNTER — Ambulatory Visit: Admitting: Neurology

## 2023-08-09 VITALS — BP 121/77 | HR 66 | Ht 65.0 in | Wt 160.0 lb

## 2023-08-09 DIAGNOSIS — G373 Acute transverse myelitis in demyelinating disease of central nervous system: Secondary | ICD-10-CM | POA: Diagnosis not present

## 2023-08-09 DIAGNOSIS — R200 Anesthesia of skin: Secondary | ICD-10-CM | POA: Diagnosis not present

## 2023-08-09 NOTE — Progress Notes (Signed)
 Follow-up Visit   Date: 08/09/2023    Melanie Cisneros MRN: [id number] DOB: 06/22/1990    Melanie Cisneros is a 33 y.o. 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  The patient was accompanied to the clinic by self.   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  CSF testing reviewed and shows mildly elevated IgG index with no oligoclonal bands in the CSF.  With MRI brain being normal and no other cord abnormalities, she does not meet criteria for multiple sclerosis.  Clinically, she has vibration sensation involving the hands and lower legs. No weakness.  - Continue flexeril 5mg  at bedtime as needed  - MRI cervical spine wwo contrast in 3 months  2.  Right arm numbness involving the last two fingers. Symptoms most likely associated with cord abnormality, but will check for ulnar neuropathy with NCS/EMG.    - NCS/EMG of the right arm   Return to clinic in 3 months  --------------------------------------------- History of present illness: In January, she began having right leg numbness and weakness and gets vibration sensation in the right leg and twinges of pain in the back.  She also started having numbness involving the right 4th and 5th fingers.  She saw her PCP for these symptoms, who ordered MRI brain and cervical spine.  Imaging shows T2 hyperintensity involving the dorsal upper cervical cord extending to the cervicomedullary junction.  Repeat imaging with contrast showed mild patchy enhancement. MRI brain was normal and MRI thoracic spine showed normal cord signal.  She underwent CSF testing which shows normal cell count, protein, and glucose.  IgG index is elevated 0.9.  Oligoclonal bands are pending.  Labs including ESR, vitamin B12, folate, vitamin E, copper, zinc, ACE, RPR, and HIV was normal. Anti-MOG and NMO is negative.  Due to concern of an inflammatory  process, she was administered IV methylprednisolone 1g x 3 days.  Overall, she feels that her right leg weakness and twinges of pain has improved.  She still has vibration sensation in the thigh and no longer has numbness in the right hand.    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  Nonsmoker.  No drug or alcohol use.  UPDATE 08/09/2023:  She is here to discuss CSF results.  Oligoclonal bands in the CSF were absent.  She did have bands in the serum consistent with inflammatory response.  She has been doing well.  She no longer has vibration sensation in the thigh, but continues to have it in the hand and lower legs.  Symptoms are improved with flexeril. No new complaints.   Medications:  Current Outpatient Medications on File Prior to Visit  Medication Sig Dispense Refill   cyanocobalamin 1000 MCG tablet Take 1 tablet (1,000 mcg total) by mouth daily.     cyclobenzaprine (FLEXERIL) 5 MG tablet Take 1 tablet (5 mg total) by mouth at bedtime as needed for muscle spasms. 30 tablet 1   Ferrous Sulfate (IRON PO) Take 1 tablet by mouth daily as needed (low iron).     Multiple Vitamins-Minerals (BARIATRIC MULTIVITAMINS PO) Take 1 tablet by mouth daily.     omeprazole (PRILOSEC) 20 MG capsule Take 1 capsule (20 mg total) by mouth daily. 30 capsule 0   No current facility-administered medications on file prior to visit.    Allergies:  Allergies  Allergen Reactions   Amoxil [Amoxicillin] Anaphylaxis and Hives   Penicillins Anaphylaxis and  Hives   Pork Allergy Other (See Comments)    Pt has tolerated multiple doses of heparin    Vital Signs:  BP 121/77   Pulse 66   Ht 5\' 5"  (1.651 m)   Wt 160 lb (72.6 kg)   LMP 07/03/2023   SpO2 100%   BMI 26.63 kg/m   Neurological Exam: MENTAL STATUS including orientation to time, place, person, recent and remote memory, attention span and concentration, language, and fund of knowledge is normal.  Speech is not dysarthric.  CRANIAL NERVES:  No visual field  defects.  Pupils equal round and reactive to light.  Normal conjugate, extra-ocular eye movements in all directions of gaze.  No ptosis.  Face is symmetric. Palate elevates symmetrically.  Tongue is midline.  MOTOR:  Motor strength is 5/5 in all extremities.  No atrophy, fasciculations or abnormal movements.  No pronator drift.  Tone is normal.    MSRs:  Reflexes are 2+/4 throughout.  SENSORY:  Intact to vibration throughout.  COORDINATION/GAIT:  Normal finger-to- nose-finger.  Intact rapid alternating movements bilaterally.  Gait narrow based and stable.   Data: CSF 07/16/2023:  R1 W6 G52, P18, IgG index 0.9*, OCB pending AntiMOG, NMO - neg Labs 07/16/2023:  vitamin B1 78, ceruloplasmin 19, vitamin E 7.8, HIV neg, RPR neg, vitamin B12 442, folate 27, copper, zinc 73   MRI thoracic spine wo contrast 07/16/2023: 1. Normal MRI appearance of the thoracic spinal cord. No abnormal enhancement. 2. Minor spondylosis at T4-5 through T7-8 without significant stenosis or neural impingement as above.   MRI cervical spine 07/16/2023: 1. Patchy post-contrast enhancement at the dorsal aspect of the upper cervical cord at the level of the cervicomedullary junction, corresponding with signal abnormality seen on prior noncontrast MRI. Differential considerations as previously described. 2. Otherwise unremarkable and normal post-contrast MRI of the cervical spine and spinal cord.   MRI cervical spine  07/14/2023: 1. T2 hyperintense signal abnormality within the dorsal aspect of the upper cervical spinal cord (at the C2 level) and extending to the cervicomedullary junction, spanning 2.6 cm in craniocaudal dimension. Differential considerations include inflammatory demyelination (MS, NMO), subacute combined degeneration, copper deficiency, sequela viral infection and syphilis/tabes dorsalis, among others. Post-contrast MR imaging of the cervical spine is recommended for further evaluation. An MRI of the thoracic  spine (with and without contrast) should also be considered. 2. Cervical spondylosis as outlined within the body of the report. No more than mild relative spinal canal narrowing. No significant foraminal stenosis. Disc degeneration is greatest at C6-C7 (moderate at this level).   MRI brain 07/16/2023:  No abnormal enhancement within the brain itself.    Thank you for allowing me to participate in patient's care.  If I can answer any additional questions, I would be pleased to do so.    Sincerely,    Maks Cavallero K. Allena Katz, DO

## 2023-08-09 NOTE — Patient Instructions (Signed)
 Nerve testing of the right hand  MRI brain wwo contrast in 3 months  ELECTROMYOGRAM AND NERVE CONDUCTION STUDIES (EMG/NCS) INSTRUCTIONS  How to Prepare The neurologist conducting the EMG will need to know if you have certain medical conditions. Tell the neurologist and other EMG lab personnel if you: Have a pacemaker or any other electrical medical device Take blood-thinning medications Have hemophilia, a blood-clotting disorder that causes prolonged bleeding Bathing Take a shower or bath shortly before your exam in order to remove oils from your skin. Don't apply lotions or creams before the exam.  What to Expect You'll likely be asked to change into a hospital gown for the procedure and lie down on an examination table. The following explanations can help you understand what will happen during the exam.  Electrodes. The neurologist or a technician places surface electrodes at various locations on your skin depending on where you're experiencing symptoms. Or the neurologist may insert needle electrodes at different sites depending on your symptoms.  Sensations. The electrodes will at times transmit a tiny electrical current that you may feel as a twinge or spasm. The needle electrode may cause discomfort or pain that usually ends shortly after the needle is removed. If you are concerned about discomfort or pain, you may want to talk to the neurologist about taking a short break during the exam.  Instructions. During the needle EMG, the neurologist will assess whether there is any spontaneous electrical activity when the muscle is at rest - activity that isn't present in healthy muscle tissue - and the degree of activity when you slightly contract the muscle.  He or she will give you instructions on resting and contracting a muscle at appropriate times. Depending on what muscles and nerves the neurologist is examining, he or she may ask you to change positions during the exam.  After your  EMG You may experience some temporary, minor bruising where the needle electrode was inserted into your muscle. This bruising should fade within several days. If it persists, contact your primary care doctor.

## 2023-08-18 ENCOUNTER — Encounter: Admitting: Neurology

## 2023-08-18 ENCOUNTER — Encounter: Payer: Self-pay | Admitting: Neurology

## 2023-08-26 ENCOUNTER — Other Ambulatory Visit: Payer: Self-pay | Admitting: Family Medicine

## 2023-08-26 DIAGNOSIS — K219 Gastro-esophageal reflux disease without esophagitis: Secondary | ICD-10-CM

## 2023-09-04 NOTE — Therapy (Deleted)
 OUTPATIENT PHYSICAL THERAPY FEMALE PELVIC EVALUATION   Patient Name: Melanie Cisneros MRN: [id number] DOB:1990/07/11, 33 y.o., female Today's Date: 09/04/2023  END OF SESSION:   Past Medical History:  Diagnosis Date   Bacterial vaginosis    BMI 45.0-49.9, adult (HCC)    Chlamydia    Dizziness    Facial numbness    Fullness in head    Gonorrhea    History of gastric bypass    Hypokalemia    Lightheadedness    Obesity    Ovarian cyst    Palpitations    Psychosocial stressors    Ptyalism    Rubella non-immune status, antepartum    UTI (lower urinary tract infection)    Past Surgical History:  Procedure Laterality Date   GASTRIC BYPASS     WISDOM TOOTH EXTRACTION     Patient Active Problem List   Diagnosis Date Noted   Vaginal discharge 07/17/2023   Constipation 07/17/2023   Anemia 07/16/2023   Ulnar neuropathy 05/24/2023   Paresthesia 05/18/2023   Orthostatic lightheadedness 09/13/2022   Hypokalemia 07/06/2022   Anxious mood 07/06/2022   Gonorrhea 11/09/2021   Ovarian cyst 11/09/2021   Bacterial vaginosis 05/13/2020   BMI 45.0-49.9, adult (HCC) 06/06/2016   Psychosocial stressors 02/25/2016   Ptyalism 02/25/2016   Rubella non-immune status, antepartum 01/27/2016    PCP: Homer Lust, MD  REFERRING PROVIDER: Charmel Cooter, MD   REFERRING DIAG: M62.89 (ICD-10-CM) - Pelvic floor dysfunction   THERAPY DIAG:  No diagnosis found.  Rationale for Evaluation and Treatment: Rehabilitation  ONSET DATE: ***  SUBJECTIVE:                                                                                                                                                                                           SUBJECTIVE STATEMENT: Pelvic floor PT- pelvic floor dysfunction, possible pudendal nerve  Fluid intake:   PAIN:  Are you having pain? {yes/no:20286} NPRS scale: ***/10 Pain location: {pelvic pain location:27098}  Pain type: {type:313116} Pain  description: {PAIN DESCRIPTION:21022940}   Aggravating factors: *** Relieving factors: ***  PRECAUTIONS: {Therapy precautions:24002}  RED FLAGS: {PT Red Flags:29287}   WEIGHT BEARING RESTRICTIONS: No  FALLS:  Has patient fallen in last 6 months? {fallsyesno:27318}  OCCUPATION: ***  ACTIVITY LEVEL : ***  PLOF: {PLOF:24004}  PATIENT GOALS: ***  PERTINENT HISTORY:  Gastric Bypass;  Sexual abuse: {Yes/No:304960894}  BOWEL MOVEMENT: Pain with bowel movement: {yes/no:20286} Type of bowel movement:{PT BM type:27100} Fully empty rectum: {No/Yes:304960894} Leakage: {Yes/No:304960894} Pads: {Yes/No:304960894} Fiber supplement/laxative {YES/NO AS:20300}  URINATION: Pain with urination: {yes/no:20286} Fully empty bladder: {Yes/No:304960894}*** Stream: {PT urination:27102} Urgency: {YES/NO AS:20300}  Frequency: *** Leakage: {PT leakage:27103} Pads: {Yes/No:304960894}  INTERCOURSE:  Ability to have vaginal penetration {YES/NO:21197} Pain with intercourse: {pain with intercourse PA:27099} Dryness{YES/NO AS:20300} Climax: *** Marinoff Scale: ***/3 Laxative:  PREGNANCY: Vaginal deliveries *** Tearing {Yes***/No:304960894} Episiotomy {YES/NO AS:20300} C-section deliveries *** Currently pregnant {Yes***/No:304960894}  PROLAPSE: {PT prolapse:27101}   OBJECTIVE:  Note: Objective measures were completed at Evaluation unless otherwise noted.  DIAGNOSTIC FINDINGS:  ***  PATIENT SURVEYS:  {rehab surveys:24030}  PFIQ-7: ***  COGNITION: Overall cognitive status: {cognition:24006}     SENSATION: Light touch: {intact/deficits:24005}  LUMBAR SPECIAL TESTS:  {lumbar special test:25242}  FUNCTIONAL TESTS:  {Functional tests:24029}  GAIT: Assistive device utilized: {Assistive devices:23999} Comments: ***  POSTURE: {posture:25561}   LUMBARAROM/PROM:  A/PROM A/PROM  eval  Flexion   Extension   Right lateral flexion   Left lateral flexion   Right  rotation   Left rotation    (Blank rows = not tested)  LOWER EXTREMITY ROM:  {AROM/PROM:27142} ROM Right eval Left eval  Hip flexion    Hip extension    Hip abduction    Hip adduction    Hip internal rotation    Hip external rotation    Knee flexion    Knee extension    Ankle dorsiflexion    Ankle plantarflexion    Ankle inversion    Ankle eversion     (Blank rows = not tested)  LOWER EXTREMITY MMT:  MMT Right eval Left eval  Hip flexion    Hip extension    Hip abduction    Hip adduction    Hip internal rotation    Hip external rotation    Knee flexion    Knee extension    Ankle dorsiflexion    Ankle plantarflexion    Ankle inversion    Ankle eversion     (Blank rows = not tested) PALPATION:   General: ***  Pelvic Alignment: ***  Abdominal: ***                External Perineal Exam: ***                             Internal Pelvic Floor: ***  Patient confirms identification and approves PT to assess internal pelvic floor and treatment {yes/no:20286}  PELVIC MMT:   MMT eval  Vaginal   Internal Anal Sphincter   External Anal Sphincter   Puborectalis   Diastasis Recti   (Blank rows = not tested)        TONE: ***  PROLAPSE: ***  TODAY'S TREATMENT:                                                                                                                              DATE: ***  EVAL ***   PATIENT EDUCATION:  Education details: *** Person educated: {Person educated:25204} Education method: {Education Method:25205} Education comprehension: {Education Comprehension:25206}  HOME EXERCISE PROGRAM: ***  ASSESSMENT:  CLINICAL IMPRESSION: Patient is a *** y.o. *** who was seen today for physical therapy evaluation and treatment for ***.   OBJECTIVE IMPAIRMENTS: {opptimpairments:25111}.   ACTIVITY LIMITATIONS: {activitylimitations:27494}  PARTICIPATION LIMITATIONS: {participationrestrictions:25113}  PERSONAL FACTORS: {Personal  factors:25162} are also affecting patient's functional outcome.   REHAB POTENTIAL: {rehabpotential:25112}  CLINICAL DECISION MAKING: {clinical decision making:25114}  EVALUATION COMPLEXITY: {Evaluation complexity:25115}   GOALS: Goals reviewed with patient? {yes/no:20286}  SHORT TERM GOALS: Target date: ***  *** Baseline: Goal status: INITIAL  2.  *** Baseline:  Goal status: INITIAL  3.  *** Baseline:  Goal status: INITIAL  4.  *** Baseline:  Goal status: INITIAL  5.  *** Baseline:  Goal status: INITIAL  6.  *** Baseline:  Goal status: INITIAL  LONG TERM GOALS: Target date: ***  *** Baseline:  Goal status: INITIAL  2.  *** Baseline:  Goal status: INITIAL  3.  *** Baseline:  Goal status: INITIAL  4.  *** Baseline:  Goal status: INITIAL  5.  *** Baseline:  Goal status: INITIAL  6.  *** Baseline:  Goal status: INITIAL  PLAN:  PT FREQUENCY: {rehab frequency:25116}  PT DURATION: {rehab duration:25117}  PLANNED INTERVENTIONS: {rehab planned interventions:25118::"97110-Therapeutic exercises","97530- Therapeutic [phone]- Neuromuscular re-education","97535- Self HYQM","57846- Manual therapy"}  PLAN FOR NEXT SESSION: ***   Miyanna Wiersma, PT 09/04/2023, 8:37 AM

## 2023-09-05 ENCOUNTER — Encounter: Payer: Medicaid Other | Admitting: Physical Therapy

## 2023-09-11 NOTE — Therapy (Deleted)
 OUTPATIENT PHYSICAL THERAPY FEMALE PELVIC EVALUATION   Patient Name: Melanie Cisneros MRN: [id number] DOB:Oct 13, 1990, 33 y.o., female Today's Date: 09/11/2023  END OF SESSION:   Past Medical History:  Diagnosis Date   Bacterial vaginosis    BMI 45.0-49.9, adult (HCC)    Chlamydia    Dizziness    Facial numbness    Fullness in head    Gonorrhea    History of gastric bypass    Hypokalemia    Lightheadedness    Obesity    Ovarian cyst    Palpitations    Psychosocial stressors    Ptyalism    Rubella non-immune status, antepartum    UTI (lower urinary tract infection)    Past Surgical History:  Procedure Laterality Date   GASTRIC BYPASS     WISDOM TOOTH EXTRACTION     Patient Active Problem List   Diagnosis Date Noted   Vaginal discharge 07/17/2023   Constipation 07/17/2023   Anemia 07/16/2023   Ulnar neuropathy 05/24/2023   Paresthesia 05/18/2023   Orthostatic lightheadedness 09/13/2022   Hypokalemia 07/06/2022   Anxious mood 07/06/2022   Gonorrhea 11/09/2021   Ovarian cyst 11/09/2021   Bacterial vaginosis 05/13/2020   BMI 45.0-49.9, adult (HCC) 06/06/2016   Psychosocial stressors 02/25/2016   Ptyalism 02/25/2016   Rubella non-immune status, antepartum 01/27/2016    PCP: Homer Lust, MD  REFERRING PROVIDER: Charmel Cooter, MD   REFERRING DIAG: M62.89 (ICD-10-CM) - Pelvic floor dysfunction   THERAPY DIAG:  No diagnosis found.  Rationale for Evaluation and Treatment: {HABREHAB:27488}  ONSET DATE: ***  SUBJECTIVE:                                                                                                                                                                                           SUBJECTIVE STATEMENT: *** Fluid intake:   PAIN:  Are you having pain? {yes/no:20286} NPRS scale: ***/10 Pain location: {pelvic pain location:27098}  Pain type: {type:313116} Pain description: {PAIN DESCRIPTION:21022940}   Aggravating factors:  *** Relieving factors: ***  PRECAUTIONS: {Therapy precautions:24002}  RED FLAGS: {PT Red Flags:29287}   WEIGHT BEARING RESTRICTIONS: No  FALLS:  Has patient fallen in last 6 months? {fallsyesno:27318}  OCCUPATION: ***  ACTIVITY LEVEL : ***  PLOF: {PLOF:24004}  PATIENT GOALS: ***  PERTINENT HISTORY:  Gastric bypass;  Sexual abuse: {Yes/No:304960894}  BOWEL MOVEMENT: Pain with bowel movement: {yes/no:20286} Type of bowel movement:{PT BM type:27100} Fully empty rectum: {No/Yes:304960894} Leakage: {Yes/No:304960894} Pads: {Yes/No:304960894} Fiber supplement/laxative {YES/NO AS:20300}  URINATION: Pain with urination: {yes/no:20286} Fully empty bladder: {Yes/No:304960894}*** Stream: {PT urination:27102} Urgency: {YES/NO AS:20300} Frequency: *** Leakage: {PT leakage:27103} Pads: {Yes/No:304960894}  INTERCOURSE:  Ability to have vaginal penetration {YES/NO:21197} Pain with intercourse: {pain with intercourse PA:27099} Dryness{YES/NO AS:20300} Climax: *** Marinoff Scale: ***/3 Laxative:  PREGNANCY: Vaginal deliveries *** Tearing {Yes***/No:304960894} Episiotomy {YES/NO AS:20300} C-section deliveries *** Currently pregnant {Yes***/No:304960894}  PROLAPSE: {PT prolapse:27101}   OBJECTIVE:  Note: Objective measures were completed at Evaluation unless otherwise noted.  DIAGNOSTIC FINDINGS:  ***  PATIENT SURVEYS:  {rehab surveys:24030}  PFIQ-7: ***  COGNITION: Overall cognitive status: {cognition:24006}     SENSATION: Light touch: {intact/deficits:24005}  LUMBAR SPECIAL TESTS:  {lumbar special test:25242}  FUNCTIONAL TESTS:  {Functional tests:24029}  GAIT: Assistive device utilized: {Assistive devices:23999} Comments: ***  POSTURE: {posture:25561}   LUMBARAROM/PROM:  A/PROM A/PROM  eval  Flexion   Extension   Right lateral flexion   Left lateral flexion   Right rotation   Left rotation    (Blank rows = not tested)  LOWER  EXTREMITY ROM:  {AROM/PROM:27142} ROM Right eval Left eval  Hip flexion    Hip extension    Hip abduction    Hip adduction    Hip internal rotation    Hip external rotation    Knee flexion    Knee extension    Ankle dorsiflexion    Ankle plantarflexion    Ankle inversion    Ankle eversion     (Blank rows = not tested)  LOWER EXTREMITY MMT:  MMT Right eval Left eval  Hip flexion    Hip extension    Hip abduction    Hip adduction    Hip internal rotation    Hip external rotation    Knee flexion    Knee extension    Ankle dorsiflexion    Ankle plantarflexion    Ankle inversion    Ankle eversion     (Blank rows = not tested) PALPATION:   General: ***  Pelvic Alignment: ***  Abdominal: ***                External Perineal Exam: ***                             Internal Pelvic Floor: ***  Patient confirms identification and approves PT to assess internal pelvic floor and treatment {yes/no:20286}  PELVIC MMT:   MMT eval  Vaginal   Internal Anal Sphincter   External Anal Sphincter   Puborectalis   Diastasis Recti   (Blank rows = not tested)        TONE: ***  PROLAPSE: ***  TODAY'S TREATMENT:                                                                                                                              DATE: ***  EVAL ***   PATIENT EDUCATION:  Education details: *** Person educated: {Person educated:25204} Education method: {Education Method:25205} Education comprehension: {Education Comprehension:25206}  HOME EXERCISE PROGRAM: ***  ASSESSMENT:  CLINICAL IMPRESSION: Patient is a *** y.o. ***  who was seen today for physical therapy evaluation and treatment for ***.   OBJECTIVE IMPAIRMENTS: {opptimpairments:25111}.   ACTIVITY LIMITATIONS: {activitylimitations:27494}  PARTICIPATION LIMITATIONS: {participationrestrictions:25113}  PERSONAL FACTORS: {Personal factors:25162} are also affecting patient's functional outcome.    REHAB POTENTIAL: {rehabpotential:25112}  CLINICAL DECISION MAKING: {clinical decision making:25114}  EVALUATION COMPLEXITY: {Evaluation complexity:25115}   GOALS: Goals reviewed with patient? {yes/no:20286}  SHORT TERM GOALS: Target date: ***  *** Baseline: Goal status: INITIAL  2.  *** Baseline:  Goal status: INITIAL  3.  *** Baseline:  Goal status: INITIAL  4.  *** Baseline:  Goal status: INITIAL  5.  *** Baseline:  Goal status: INITIAL  6.  *** Baseline:  Goal status: INITIAL  LONG TERM GOALS: Target date: ***  *** Baseline:  Goal status: INITIAL  2.  *** Baseline:  Goal status: INITIAL  3.  *** Baseline:  Goal status: INITIAL  4.  *** Baseline:  Goal status: INITIAL  5.  *** Baseline:  Goal status: INITIAL  6.  *** Baseline:  Goal status: INITIAL  PLAN:  PT FREQUENCY: {rehab frequency:25116}  PT DURATION: {rehab duration:25117}  PLANNED INTERVENTIONS: {rehab planned interventions:25118::"97110-Therapeutic exercises","97530- Therapeutic [phone]- Neuromuscular re-education","97535- Self ONGE","95284- Manual therapy"}  PLAN FOR NEXT SESSION: ***   Kyleigh Nannini, PT 09/11/2023, 10:01 AM

## 2023-09-12 ENCOUNTER — Encounter: Payer: Medicaid Other | Admitting: Physical Therapy

## 2023-09-13 ENCOUNTER — Telehealth: Admitting: Physician Assistant

## 2023-09-13 DIAGNOSIS — B379 Candidiasis, unspecified: Secondary | ICD-10-CM | POA: Diagnosis not present

## 2023-09-13 DIAGNOSIS — B9689 Other specified bacterial agents as the cause of diseases classified elsewhere: Secondary | ICD-10-CM | POA: Diagnosis not present

## 2023-09-13 DIAGNOSIS — N760 Acute vaginitis: Secondary | ICD-10-CM | POA: Diagnosis not present

## 2023-09-13 DIAGNOSIS — T3695XA Adverse effect of unspecified systemic antibiotic, initial encounter: Secondary | ICD-10-CM

## 2023-09-13 MED ORDER — METRONIDAZOLE 0.75 % VA GEL: 1.0000 | Freq: Every day | VAGINAL | 0 refills | Status: DC

## 2023-09-13 MED ORDER — FLUCONAZOLE 150 MG PO TABS: 150.0000 mg | ORAL_TABLET | ORAL | 0 refills | Status: DC | PRN

## 2023-09-13 NOTE — Patient Instructions (Signed)
 Melanie Cisneros, thank you for joining Angelia Kelp, PA-C for today's virtual visit.  While this provider is not your primary care provider (PCP), if your PCP is located in our provider database this encounter information will be shared with them immediately following your visit.   A [facility] MyChart account gives you access to today's visit and all your visits, tests, and labs performed at [facility] " click here if you don't have a [facility] MyChart account or go to [email or web address]  Consent: (Patient) Melanie Cisneros provided verbal consent for this virtual visit at the beginning of the encounter.  Current Medications:  Current Outpatient Medications:    fluconazole  (DIFLUCAN ) 150 MG tablet, Take 1 tablet (150 mg total) by mouth every 3 (three) days as needed., Disp: 3 tablet, Rfl: 0   metroNIDAZOLE  (METROGEL ) 0.75 % vaginal gel, Place 1 Applicatorful vaginally at bedtime., Disp: 70 g, Rfl: 0   cyanocobalamin  1000 MCG tablet, Take 1 tablet (1,000 mcg total) by mouth daily., Disp: , Rfl:    cyclobenzaprine  (FLEXERIL ) 5 MG tablet, Take 1 tablet (5 mg total) by mouth at bedtime as needed for muscle spasms., Disp: 30 tablet, Rfl: 1   Ferrous Sulfate (IRON  PO), Take 1 tablet by mouth daily as needed (low iron )., Disp: , Rfl:    Multiple Vitamins-Minerals (BARIATRIC MULTIVITAMINS PO), Take 1 tablet by mouth daily., Disp: , Rfl:    omeprazole  (PRILOSEC) 20 MG capsule, TAKE 1 CAPSULE BY MOUTH EVERY DAY, Disp: 90 capsule, Rfl: 1   Medications ordered in this encounter:  Meds ordered this encounter  Medications   metroNIDAZOLE  (METROGEL ) 0.75 % vaginal gel    Sig: Place 1 Applicatorful vaginally at bedtime.    Dispense:  70 g    Refill:  0    Supervising Provider:   Corine Dice [[id number]]   fluconazole  (DIFLUCAN ) 150 MG tablet    Sig: Take 1 tablet (150 mg total) by mouth every 3 (three) days as needed.    Dispense:  3 tablet    Refill:  0     Supervising Provider:   LAMPTEY, PHILIP O [[id number]]     *If you need refills on other medications prior to your next appointment, please contact your pharmacy*  Follow-Up: Call back or seek an in-person evaluation if the symptoms worsen or if the condition fails to improve as anticipated.  [facility] [phone]  Other Instructions Vaginal Probiotics: AZO vaginal probiotic OLLY Happy Hoo-Ha RAW Vaginal Care RenewLife Women's vaginal probiotic RepHresh Pro-B  Vaginal washes: Honey Pot Summer's Eve Vagisil Feminine cleanser  Healthy vaginal hygiene practices    -  Avoid sleeper pajamas. Nightgowns allow air to circulate.  Sleep without underpants whenever possible.   -  Wear cotton underpants during the day. Double-rinse underwear after washing to avoid residual irritants. Do not use fabric softeners for underwear and swimsuits.   - Avoid tights, leotards, leggings, "skinny" jeans, and other tight-fitting clothing. Skirts and loose-fitting pants allow air to circulate.   - Avoid pantyliners.  Instead use tampons or cotton pads.   - Use the restroom after intercourse to help prevent UTI's   - Daily warm bathing is helpful:     - Soak in clean water  (no soap) for 10 to 15 minutes. Adding vinegar or baking soda to the water  has not been specifically studied and may not be better than clean water  alone.      - Use soap to wash regions other  than the genital area just before getting out of the tub. Limit use of any soap on genital areas. Use fragance-free soaps.     - Rinse the genital area well and gently pat dry.  Don't rub.  Hair dryer to assist with drying can be used only if on cool setting.     - Do not use bubble baths or perfumed soaps.   - Do not use any feminine sprays, douches or powders.  These contain chemicals that will irritate the skin.   - If the genital area is tender or swollen, cool compresses may relieve the discomfort. Unscented wet wipes can  be used instead of toilet paper for wiping.    - Emollients, such as Vaseline, may help protect skin and can be applied to the irritated area.   - Always remember to wipe front-to-back after bowel movements. Pat dry after urination.   - Do not sit in wet swimsuits for long periods of time after swimming    If you have been instructed to have an in-person evaluation today at a local Urgent Care facility, please use the link below. It will take you to a list of all of our available [facility], including address, phone number and hours of operation. Please do not delay care.  [facility]  If you or a family member do not have a primary care provider, use the link below to schedule a visit and establish care. When you choose a [facility] primary care physician or advanced practice provider, you gain a long-term partner in health. Find a Primary Care Provider  Learn more about [facility] in-office and virtual care options: [facility] - Get Care Now

## 2023-09-13 NOTE — Progress Notes (Signed)
 Virtual Visit Consent   Melanie Cisneros, you are scheduled for a virtual visit with a [facility] provider today. Just as with appointments in the office, your consent must be obtained to participate. Your consent will be active for this visit and any virtual visit you may have with one of our providers in the next 365 days. If you have a MyChart account, a copy of this consent can be sent to you electronically.  As this is a virtual visit, video technology does not allow for your provider to perform a traditional examination. This may limit your provider's ability to fully assess your condition. If your provider identifies any concerns that need to be evaluated in person or the need to arrange testing (such as labs, EKG, etc.), we will make arrangements to do so. Although advances in technology are sophisticated, we cannot ensure that it will always work on either your end or our end. If the connection with a video visit is poor, the visit may have to be switched to a telephone visit. With either a video or telephone visit, we are not always able to ensure that we have a secure connection.  By engaging in this virtual visit, you consent to the provision of healthcare and authorize for your insurance to be billed (if applicable) for the services provided during this visit. Depending on your insurance coverage, you may receive a charge related to this service.  I need to obtain your verbal consent now. Are you willing to proceed with your visit today? Melanie Cisneros has provided verbal consent on 09/13/2023 for a virtual visit (video or telephone). Angelia Kelp, PA-C  Date: 09/13/2023 11:30 AM   Virtual Visit via Video Note   I, Angelia Kelp, connected with  Melanie Cisneros  ([id number], 02-15-1991) on 09/13/23 at 11:30 AM EDT by a video-enabled telemedicine application and verified that I am speaking with the correct person using two identifiers.  Location: Patient: Virtual Visit  Location Patient: Mobile Provider: Virtual Visit Location Provider: Home Office   I discussed the limitations of evaluation and management by telemedicine and the availability of in person appointments. The patient expressed understanding and agreed to proceed.    History of Present Illness: Melanie Cisneros is a 33 y.o. who identifies as a female who was assigned female at birth, and is being seen today for vaginal discharge with odor.  HPI: Vaginal Discharge The patient's primary symptoms include a genital odor and vaginal discharge. The patient's pertinent negatives include no genital itching. This is a new problem. The current episode started in the past 7 days. The problem occurs constantly. The problem has been unchanged. The patient is experiencing no pain. Associated symptoms include abdominal pain (mild discomfort) and constipation (chronic, uses miralax ). Pertinent negatives include no back pain, chills, dysuria, fever, flank pain, frequency, headaches, nausea or vomiting. The vaginal discharge was malodorous, white and milky. There has been no bleeding. She has not been passing clots. She has not been passing tissue. Nothing aggravates the symptoms. She has tried nothing (prebiotic) for the symptoms. The treatment provided no relief.  Started after a beach vacation and using hotel soap.    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   Paresthesia 05/18/2023   Orthostatic lightheadedness 09/13/2022   Hypokalemia 07/06/2022   Anxious mood 07/06/2022   Gonorrhea 11/09/2021  Ovarian cyst 11/09/2021   Bacterial vaginosis 05/13/2020   BMI 45.0-49.9, adult (HCC) 06/06/2016   Psychosocial stressors 02/25/2016   Ptyalism 02/25/2016   Rubella non-immune status, antepartum 01/27/2016    Allergies:  Allergies  Allergen Reactions   Amoxil [Amoxicillin]  Anaphylaxis and Hives   Penicillins Anaphylaxis and Hives   Pork Allergy Other (See Comments)    Pt has tolerated multiple doses of heparin   Medications:  Current Outpatient Medications:    fluconazole  (DIFLUCAN ) 150 MG tablet, Take 1 tablet (150 mg total) by mouth every 3 (three) days as needed., Disp: 3 tablet, Rfl: 0   metroNIDAZOLE  (METROGEL ) 0.75 % vaginal gel, Place 1 Applicatorful vaginally at bedtime., Disp: 70 g, Rfl: 0   cyanocobalamin  1000 MCG tablet, Take 1 tablet (1,000 mcg total) by mouth daily., Disp: , Rfl:    cyclobenzaprine  (FLEXERIL ) 5 MG tablet, Take 1 tablet (5 mg total) by mouth at bedtime as needed for muscle spasms., Disp: 30 tablet, Rfl: 1   Ferrous Sulfate (IRON  PO), Take 1 tablet by mouth daily as needed (low iron )., Disp: , Rfl:    Multiple Vitamins-Minerals (BARIATRIC MULTIVITAMINS PO), Take 1 tablet by mouth daily., Disp: , Rfl:    omeprazole  (PRILOSEC) 20 MG capsule, TAKE 1 CAPSULE BY MOUTH EVERY DAY, Disp: 90 capsule, Rfl: 1  Observations/Objective: Patient is well-developed, well-nourished in no acute distress.  Resting comfortably Head is normocephalic, atraumatic.  No labored breathing.  Speech is clear and coherent with logical content.  Patient is alert and oriented at baseline.    Assessment and Plan: 1. BV (bacterial vaginosis) (Primary) - metroNIDAZOLE  (METROGEL ) 0.75 % vaginal gel; Place 1 Applicatorful vaginally at bedtime.  Dispense: 70 g; Refill: 0  2. Antibiotic-induced yeast infection - fluconazole  (DIFLUCAN ) 150 MG tablet; Take 1 tablet (150 mg total) by mouth every 3 (three) days as needed.  Dispense: 3 tablet; Refill: 0  - Symptoms consistent with BV - Metronidazole  vaginal gel prescribed - Limit bubble baths, scented lotions/soaps/detergents - Limit tight fitting clothing - Diflucan  given as prophylaxis as patient tends to get vaginal yeast infections with antibiotic use - Seek on person evaluation if not improving or if symptoms  worsen   Follow Up Instructions: I discussed the assessment and treatment plan with the patient. The patient was provided an opportunity to ask questions and all were answered. The patient agreed with the plan and demonstrated an understanding of the instructions.  A copy of instructions were sent to the patient via MyChart unless otherwise noted below.    The patient was advised to call back or seek an in-person evaluation if the symptoms worsen or if the condition fails to improve as anticipated.    Angelia Kelp, PA-C

## 2023-09-15 ENCOUNTER — Encounter (HOSPITAL_COMMUNITY): Payer: Self-pay

## 2023-09-15 ENCOUNTER — Ambulatory Visit (HOSPITAL_COMMUNITY)
Admission: EM | Admit: 2023-09-15 | Discharge: 2023-09-15 | Disposition: A | Discharge status: Home / Self Care | Attending: Family Medicine | Admitting: Family Medicine

## 2023-09-15 DIAGNOSIS — Z113 Encounter for screening for infections with a predominantly sexual mode of transmission: Secondary | ICD-10-CM | POA: Insufficient documentation

## 2023-09-15 DIAGNOSIS — N898 Other specified noninflammatory disorders of vagina: Secondary | ICD-10-CM | POA: Insufficient documentation

## 2023-09-15 DIAGNOSIS — Z3202 Encounter for pregnancy test, result negative: Secondary | ICD-10-CM

## 2023-09-15 DIAGNOSIS — R109 Unspecified abdominal pain: Secondary | ICD-10-CM | POA: Diagnosis not present

## 2023-09-15 LAB — POCT URINALYSIS DIP (MANUAL ENTRY)
Bilirubin, UA: NEGATIVE
Blood, UA: NEGATIVE
Glucose, UA: NEGATIVE mg/dL
Ketones, POC UA: NEGATIVE mg/dL
Leukocytes, UA: NEGATIVE
Nitrite, UA: NEGATIVE
Protein Ur, POC: NEGATIVE mg/dL
Spec Grav, UA: 1.015 (ref 1.010–1.025)
Urobilinogen, UA: 0.2 U/dL
pH, UA: 6.5 (ref 5.0–8.0)

## 2023-09-15 LAB — POCT URINE PREGNANCY: Preg Test, Ur: NEGATIVE

## 2023-09-15 MED ORDER — CLOTRIMAZOLE-BETAMETHASONE 1-0.05 % EX CREA: TOPICAL_CREAM | CUTANEOUS | 0 refills | Status: DC

## 2023-09-15 NOTE — ED Provider Notes (Signed)
 [facility]    CSN: [id number] Arrival date & time: 09/15/23  0803      History   Chief Complaint Chief Complaint  Patient presents with   Vaginal Itching   Abdominal Pain    HPI Melanie Cisneros is a 33 y.o. female.    Vaginal Itching Associated symptoms include abdominal pain.  Abdominal Pain Associated symptoms: vaginal discharge    She has been 5 days of vaginal irritation.  She did a virtual visit about 5 days ago, given metrogel  for possible BV, and Diflucan  for possible yeast.  She just took the first yeast pill yesterday, but has been using the metrogel .  She is now having d/c and itching, whish is new since using the cream.  No known risk for STDs, no known exposures.  She had slight abdominal pain, but thinks she may have constipation.  Slight pain with urination, but thinks is due to the vaginal irritation.        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   Paresthesia 05/18/2023   Orthostatic lightheadedness 09/13/2022   Hypokalemia 07/06/2022   Anxious mood 07/06/2022   Gonorrhea 11/09/2021   Ovarian cyst 11/09/2021   Bacterial vaginosis 05/13/2020   BMI 45.0-49.9, adult (HCC) 06/06/2016   Psychosocial stressors 02/25/2016   Ptyalism 02/25/2016   Rubella non-immune status, antepartum 01/27/2016    Past Surgical History:  Procedure Laterality Date   GASTRIC BYPASS     WISDOM TOOTH EXTRACTION      OB History      Gravida  1   Para  1   Term  1   Preterm  0   AB  0   Living  1      SAB  0   IAB  0   Ectopic  0   Multiple  0   Live Births  1            Home Medications    Prior to Admission medications   Medication Sig Start Date End Date Taking? Authorizing Provider  cyanocobalamin  1000 MCG tablet Take 1 tablet (1,000 mcg total) by mouth daily. 07/18/23  Yes Shitarev, Dimitry, MD  fluconazole  (DIFLUCAN ) 150 MG tablet Take 1 tablet (150 mg total) by mouth every 3 (three) days as needed. 09/13/23  Yes Angelia Kelp, PA-C  metroNIDAZOLE  (METROGEL ) 0.75 % vaginal gel Place 1 Applicatorful vaginally at bedtime. 09/13/23  Yes Angelia Kelp, PA-C  Multiple Vitamins-Minerals (BARIATRIC MULTIVITAMINS PO) Take 1 tablet by mouth daily.   Yes [provider]  cyclobenzaprine  (FLEXERIL ) 5 MG tablet Take 1 tablet (5 mg total) by mouth at bedtime as needed for muscle spasms. 07/24/23   Patel, Donika K, DO  Ferrous Sulfate (IRON  PO) Take 1 tablet by mouth daily as needed (low iron ).  [provider]  omeprazole  (PRILOSEC) 20 MG capsule TAKE 1 CAPSULE BY MOUTH EVERY DAY 08/28/23   Shitarev, Dimitry, MD    Family History Family History  Problem Relation Age of Onset   Diabetes Father    Hypertension Mother     Social History Social History   Tobacco Use   Smoking status: Never   Smokeless tobacco: Never  Vaping Use   Vaping status: Never Used  Substance Use Topics   Alcohol use: Not Currently   Drug use: No     Allergies   Amoxil [amoxicillin], Penicillins, and Pork allergy   Review of Systems Review of Systems  Constitutional: Negative.   HENT: Negative.    Respiratory: Negative.    Cardiovascular: Negative.   Gastrointestinal:  Positive for abdominal pain.  Genitourinary:  Positive for vaginal discharge and vaginal pain.  Musculoskeletal: Negative.   Skin: Negative.   Psychiatric/Behavioral: Negative.       Physical Exam Triage  Vital Signs ED Triage Vitals  Encounter Vitals Group     BP 09/15/23 0823 121/79     Systolic BP Percentile --      Diastolic BP Percentile --      Pulse Rate 09/15/23 0823 (!) 55     Resp 09/15/23 0823 16     Temp 09/15/23 0823 98.1 F (36.7 C)     Temp Source 09/15/23 0823 Oral     SpO2 09/15/23 0823 100 %     Weight --      Height --      Head Circumference --      Peak Flow --      Pain Score 09/15/23 0821 0     Pain Loc --      Pain Education --      Exclude from Growth Chart --    No data found.  Updated Vital Signs BP 121/79 (BP Location: Left Arm)   Pulse (!) 55   Temp 98.1 F (36.7 C) (Oral)   Resp 16   LMP 08/27/2023 (Approximate)   SpO2 100%   Visual Acuity Right Eye Distance:   Left Eye Distance:   Bilateral Distance:    Right Eye Near:   Left Eye Near:    Bilateral Near:     Physical Exam Constitutional:      Appearance: She is well-developed and normal weight.  Cardiovascular:     Rate and Rhythm: Normal rate and regular rhythm.  Pulmonary:     Breath sounds: Normal breath sounds.  Skin:    General: Skin is warm.  Neurological:     Mental Status: She is alert.      UC Treatments / Results  Labs (all labs ordered are listed, but only abnormal results are displayed) Labs Reviewed  POCT URINALYSIS DIP (MANUAL ENTRY)  POCT URINE PREGNANCY  CERVICOVAGINAL ANCILLARY ONLY   UPT negative  EKG   Radiology No results found.  Procedures Procedures (including critical care time)  Medications Ordered in UC Medications - No data to display  Initial Impression / Assessment and Plan / UC Course  I have reviewed the triage vital signs and the nursing notes.  Pertinent labs & imaging results that were available during my care of the patient were reviewed by me and considered in my medical decision making (see chart for details).   Final Clinical Impressions(s) / UC Diagnoses   Final diagnoses:  Vaginal discharge  Screening for STD  (sexually transmitted disease)  Abdominal pain, unspecified abdominal location  Discharge Instructions      You were seen today for vaginal itching and pain.  Your swab will be resulted over the weekend.  You can finish the metrogel  and diflucan  given by your other provider.  I have sent out a cream to help with the itching and pain.  Your urine looked okay today.  Please follow up if you continue with symptoms despite treatment.     ED Prescriptions     Medication Sig Dispense Auth. Provider   clotrimazole-betamethasone (LOTRISONE) cream Apply to affected area 2 times daily prn 15 g Lesle Ras, MD      PDMP not reviewed this encounter.   Lesle Ras, MD 09/15/23 [phone]

## 2023-09-15 NOTE — ED Triage Notes (Signed)
 Chief Complaint: abdominal pain. Was seen virtually and treated for BV. States still having itching and irritation. Patient also took a yeast pill yesterday. Denies NVD.   Sick exposure: No  Onset: 5 days   Prescriptions or OTC medications tried: Yes- Diflucan , Metronidazole  gel,     with little relief

## 2023-09-15 NOTE — Discharge Instructions (Signed)
 You were seen today for vaginal itching and pain.  Your swab will be resulted over the weekend.  You can finish the metrogel  and diflucan  given by your other provider.  I have sent out a cream to help with the itching and pain.  Your urine looked okay today.  Please follow up if you continue with symptoms despite treatment.

## 2023-09-16 ENCOUNTER — Ambulatory Visit (HOSPITAL_COMMUNITY): Payer: Self-pay

## 2023-09-18 NOTE — Therapy (Deleted)
 OUTPATIENT PHYSICAL THERAPY FEMALE PELVIC EVALUATION   Patient Name: Melanie Cisneros MRN: [id number] DOB:07/20/1990, 33 y.o., female Today's Date: 09/18/2023  END OF SESSION:   Past Medical History:  Diagnosis Date   Bacterial vaginosis    BMI 45.0-49.9, adult (HCC)    Chlamydia    Dizziness    Facial numbness    Fullness in head    Gonorrhea    History of gastric bypass    Hypokalemia    Lightheadedness    Obesity    Ovarian cyst    Palpitations    Psychosocial stressors    Ptyalism    Rubella non-immune status, antepartum    UTI (lower urinary tract infection)    Past Surgical History:  Procedure Laterality Date   GASTRIC BYPASS     WISDOM TOOTH EXTRACTION     Patient Active Problem List   Diagnosis Date Noted   Vaginal discharge 07/17/2023   Constipation 07/17/2023   Anemia 07/16/2023   Ulnar neuropathy 05/24/2023   Paresthesia 05/18/2023   Orthostatic lightheadedness 09/13/2022   Hypokalemia 07/06/2022   Anxious mood 07/06/2022   Gonorrhea 11/09/2021   Ovarian cyst 11/09/2021   Bacterial vaginosis 05/13/2020   BMI 45.0-49.9, adult (HCC) 06/06/2016   Psychosocial stressors 02/25/2016   Ptyalism 02/25/2016   Rubella non-immune status, antepartum 01/27/2016    PCP: Homer Lust, MD  REFERRING PROVIDER: Charmel Cooter, MD   REFERRING DIAG: M62.89 (ICD-10-CM) - Pelvic floor dysfunction   THERAPY DIAG:  No diagnosis found.  Rationale for Evaluation and Treatment: Rehabilitation  ONSET DATE: ***  SUBJECTIVE:                                                                                                                                                                                           SUBJECTIVE STATEMENT: Pelvic floor PT- pelvic floor dysfunction, possible pudendal nerve ; Feels some numbness in right labia and in her buttock. Feels like she has a nerve pulling. Fluid intake:   PAIN:  Are you having pain? {yes/no:20286} NPRS  scale: ***/10 Pain location: {pelvic pain location:27098}  Pain type: {type:313116} Pain description: {PAIN DESCRIPTION:21022940}   Aggravating factors: *** Relieving factors: ***  PRECAUTIONS: None  RED FLAGS: {PT Red Flags:29287}   WEIGHT BEARING RESTRICTIONS: No  FALLS:  Has patient fallen in last 6 months? {fallsyesno:27318}  OCCUPATION: ***  ACTIVITY LEVEL : ***  PLOF: {PLOF:24004}  PATIENT GOALS: ***  PERTINENT HISTORY:  BV; Gonorrhea; gastric bypass; Ovarian cyst; Ptyalism;  Sexual abuse: {Yes/No:304960894}  BOWEL MOVEMENT: Pain with bowel movement: {yes/no:20286} Type of bowel movement:{PT BM type:27100} Fully empty rectum: {No/Yes:304960894} Leakage: {Yes/No:304960894} Pads: {  GNF/AO:130865784} Fiber supplement/laxative {YES/NO AS:20300}  URINATION: Pain with urination: {yes/no:20286} Fully empty bladder: {Yes/No:304960894}*** Stream: {PT urination:27102} Urgency: {YES/NO AS:20300} Frequency: *** Leakage: {PT leakage:27103} Pads: {Yes/No:304960894}  INTERCOURSE:  Ability to have vaginal penetration {YES/NO:21197} Pain with intercourse: {pain with intercourse PA:27099} Dryness{YES/NO AS:20300} Climax: *** Marinoff Scale: ***/3 Laxative:  PREGNANCY: Vaginal deliveries *** Tearing {Yes***/No:304960894} Episiotomy {YES/NO AS:20300} C-section deliveries *** Currently pregnant {Yes***/No:304960894}  PROLAPSE: {PT prolapse:27101}   OBJECTIVE:  Note: Objective measures were completed at Evaluation unless otherwise noted.  DIAGNOSTIC FINDINGS:  ***  PATIENT SURVEYS:  {rehab surveys:24030}  PFIQ-7: ***  COGNITION: Overall cognitive status: {cognition:24006}     SENSATION: Light touch: {intact/deficits:24005}  LUMBAR SPECIAL TESTS:  {lumbar special test:25242}  FUNCTIONAL TESTS:  {Functional tests:24029}  GAIT: Assistive device utilized: {Assistive devices:23999} Comments: ***  POSTURE:  {posture:25561}   LUMBARAROM/PROM:  A/PROM A/PROM  eval  Flexion   Extension   Right lateral flexion   Left lateral flexion   Right rotation   Left rotation    (Blank rows = not tested)  LOWER EXTREMITY ROM:  {AROM/PROM:27142} ROM Right eval Left eval  Hip flexion    Hip extension    Hip abduction    Hip adduction    Hip internal rotation    Hip external rotation    Knee flexion    Knee extension    Ankle dorsiflexion    Ankle plantarflexion    Ankle inversion    Ankle eversion     (Blank rows = not tested)  LOWER EXTREMITY MMT:  MMT Right eval Left eval  Hip flexion    Hip extension    Hip abduction    Hip adduction    Hip internal rotation    Hip external rotation    Knee flexion    Knee extension    Ankle dorsiflexion    Ankle plantarflexion    Ankle inversion    Ankle eversion     (Blank rows = not tested) PALPATION:   General: ***  Pelvic Alignment: ***  Abdominal: ***                External Perineal Exam: ***                             Internal Pelvic Floor: ***  Patient confirms identification and approves PT to assess internal pelvic floor and treatment {yes/no:20286}  PELVIC MMT:   MMT eval  Vaginal   Internal Anal Sphincter   External Anal Sphincter   Puborectalis   Diastasis Recti   (Blank rows = not tested)        TONE: ***  PROLAPSE: ***  TODAY'S TREATMENT:                                                                                                                              DATE: ***  EVAL ***   PATIENT EDUCATION:  Education details: *** Person educated: {Person educated:25204} Education method: {Education Method:25205} Education comprehension: {Education Comprehension:25206}  HOME EXERCISE PROGRAM: ***  ASSESSMENT:  CLINICAL IMPRESSION: Patient is a *** y.o. *** who was seen today for physical therapy evaluation and treatment for ***.   OBJECTIVE IMPAIRMENTS: {opptimpairments:25111}.    ACTIVITY LIMITATIONS: {activitylimitations:27494}  PARTICIPATION LIMITATIONS: {participationrestrictions:25113}  PERSONAL FACTORS: {Personal factors:25162} are also affecting patient's functional outcome.   REHAB POTENTIAL: {rehabpotential:25112}  CLINICAL DECISION MAKING: {clinical decision making:25114}  EVALUATION COMPLEXITY: {Evaluation complexity:25115}   GOALS: Goals reviewed with patient? {yes/no:20286}  SHORT TERM GOALS: Target date: ***  *** Baseline: Goal status: INITIAL  2.  *** Baseline:  Goal status: INITIAL  3.  *** Baseline:  Goal status: INITIAL  4.  *** Baseline:  Goal status: INITIAL  5.  *** Baseline:  Goal status: INITIAL  6.  *** Baseline:  Goal status: INITIAL  LONG TERM GOALS: Target date: ***  *** Baseline:  Goal status: INITIAL  2.  *** Baseline:  Goal status: INITIAL  3.  *** Baseline:  Goal status: INITIAL  4.  *** Baseline:  Goal status: INITIAL  5.  *** Baseline:  Goal status: INITIAL  6.  *** Baseline:  Goal status: INITIAL  PLAN:  PT FREQUENCY: {rehab frequency:25116}  PT DURATION: {rehab duration:25117}  PLANNED INTERVENTIONS: {rehab planned interventions:25118::"97110-Therapeutic exercises","97530- Therapeutic [phone]- Neuromuscular re-education","97535- Self JXBJ","47829- Manual therapy"}  PLAN FOR NEXT SESSION: ***   Milina Pagett, PT 09/18/2023, 9:39 AM

## 2023-09-19 ENCOUNTER — Encounter: Payer: Medicaid Other | Attending: Family Medicine | Admitting: Physical Therapy

## 2023-09-19 ENCOUNTER — Telehealth: Payer: Self-pay | Admitting: Physical Therapy

## 2023-09-19 ENCOUNTER — Telehealth (HOSPITAL_COMMUNITY): Payer: Self-pay

## 2023-09-19 LAB — CERVICOVAGINAL ANCILLARY ONLY
Chlamydia: NEGATIVE
Comment: NEGATIVE
Comment: NEGATIVE
Comment: NEGATIVE
Comment: NEGATIVE
Comment: NEGATIVE
Comment: NORMAL
Neisseria Gonorrhea: NEGATIVE

## 2023-09-19 NOTE — Telephone Encounter (Signed)
 Pt will need to return for recollection d/t interfering substance on previous sample.

## 2023-09-19 NOTE — Telephone Encounter (Signed)
 Called patient about her missed evaluation today at 1400. Left a message.  Marsha Skeen, PT @5 /6/25@ 2:27 PM

## 2023-09-22 ENCOUNTER — Telehealth: Admitting: Physician Assistant

## 2023-09-22 ENCOUNTER — Ambulatory Visit (HOSPITAL_COMMUNITY)

## 2023-09-22 DIAGNOSIS — B379 Candidiasis, unspecified: Secondary | ICD-10-CM

## 2023-09-22 DIAGNOSIS — T3695XA Adverse effect of unspecified systemic antibiotic, initial encounter: Secondary | ICD-10-CM | POA: Diagnosis not present

## 2023-09-22 MED ORDER — FLUCONAZOLE 150 MG PO TABS: 150.0000 mg | ORAL_TABLET | ORAL | 0 refills | Status: DC | PRN

## 2023-09-22 NOTE — Progress Notes (Signed)
 Virtual Visit Consent   [name], you are scheduled for a virtual visit with a [facility] provider today. Just as with appointments in the office, your consent must be obtained to participate. Your consent will be active for this visit and any virtual visit you may have with one of our providers in the next 365 days. If you have a MyChart account, a copy of this consent can be sent to you electronically.  As this is a virtual visit, video technology does not allow for your provider to perform a traditional examination. This may limit your provider's ability to fully assess your condition. If your provider identifies any concerns that need to be evaluated in person or the need to arrange testing (such as labs, EKG, etc.), we will make arrangements to do so. Although advances in technology are sophisticated, we cannot ensure that it will always work on either your end or our end. If the connection with a video visit is poor, the visit may have to be switched to a telephone visit. With either a video or telephone visit, we are not always able to ensure that we have a secure connection.  By engaging in this virtual visit, you consent to the provision of healthcare and authorize for your insurance to be billed (if applicable) for the services provided during this visit. Depending on your insurance coverage, you may receive a charge related to this service.  I need to obtain your verbal consent now. Are you willing to proceed with your visit today? Ed D Ytuarte has provided verbal consent on 09/22/2023 for a virtual visit (video or telephone). Angelia Kelp, PA-C  Date: 09/22/2023 9:50 AM   Virtual Visit via Video Note   I, Angelia Kelp, connected with  Melanie Cisneros  ([id number], 08-09-1990) on 09/22/23 at  9:45 AM EDT by a video-enabled telemedicine application and verified that I am speaking with the correct person using two identifiers.  Location: Patient: Virtual Visit  Location Patient: Home Provider: Virtual Visit Location Provider: Home Office   I discussed the limitations of evaluation and management by telemedicine and the availability of in person appointments. The patient expressed understanding and agreed to proceed.    History of Present Illness: Melanie Cisneros is a 33 y.o. who identifies as a female who was assigned female at birth, and is being seen today for continued vaginal yeast symptoms.  HPI: Vaginal Discharge The patient's primary symptoms include genital itching and vaginal discharge. The patient's pertinent negatives include no genital odor or missed menses. This is a recurrent problem. The current episode started 1 to 4 weeks ago (Seen virtually on 09/13/23 and treated for BV and yeast, seen in person on 09/15/23 and given clotrimazole -betamethasone  for external irritation; still having cottage cheese discharge today and mild itching yesterday). The problem has been gradually improving. The patient is experiencing no pain. Pertinent negatives include no abdominal pain, back pain, chills, constipation, diarrhea, dysuria, fever, flank pain, frequency, hematuria, nausea or urgency. The vaginal discharge was white and thick (cottage cheese like). There has been no bleeding. The symptoms are aggravated by tactile pressure. She has tried antifungals and antibiotics (topical steroids) for the symptoms. The treatment provided moderate relief.     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   Paresthesia 05/18/2023   Orthostatic lightheadedness 09/13/2022   Hypokalemia 07/06/2022  Anxious mood 07/06/2022   Gonorrhea 11/09/2021   Ovarian cyst 11/09/2021   Bacterial vaginosis 05/13/2020   BMI 45.0-49.9, adult (HCC) 06/06/2016   Psychosocial stressors 02/25/2016   Ptyalism 02/25/2016   Rubella non-immune status,  antepartum 01/27/2016    Allergies:  Allergies  Allergen Reactions   Amoxil [Amoxicillin] Anaphylaxis and Hives   Penicillins Anaphylaxis and Hives   Pork Allergy Other (See Comments)    Pt has tolerated multiple doses of heparin   Medications:  Current Outpatient Medications:    fluconazole  (DIFLUCAN ) 150 MG tablet, Take 1 tablet (150 mg total) by mouth every 3 (three) days as needed., Disp: 2 tablet, Rfl: 0   clotrimazole -betamethasone  (LOTRISONE ) cream, Apply to affected area 2 times daily prn, Disp: 15 g, Rfl: 0   cyanocobalamin  1000 MCG tablet, Take 1 tablet (1,000 mcg total) by mouth daily., Disp: , Rfl:    cyclobenzaprine  (FLEXERIL ) 5 MG tablet, Take 1 tablet (5 mg total) by mouth at bedtime as needed for muscle spasms., Disp: 30 tablet, Rfl: 1   metroNIDAZOLE  (METROGEL ) 0.75 % vaginal gel, Place 1 Applicatorful vaginally at bedtime., Disp: 70 g, Rfl: 0   Multiple Vitamins-Minerals (BARIATRIC MULTIVITAMINS PO), Take 1 tablet by mouth daily., Disp: , Rfl:   Observations/Objective: Patient is well-developed, well-nourished in no acute distress.  Resting comfortably at home.  Head is normocephalic, atraumatic.  No labored breathing.  Speech is clear and coherent with logical content.  Patient is alert and oriented at baseline.    Assessment and Plan: 1. Antibiotic-induced yeast infection (Primary) - fluconazole  (DIFLUCAN ) 150 MG tablet; Take 1 tablet (150 mg total) by mouth every 3 (three) days as needed.  Dispense: 2 tablet; Refill: 0  - Symptoms consistent with antibiotic induced yeast vaginitis - Fluconazole  prescribed - Limit bubble baths, scented lotions/soaps/detergents - Limit tight fitting clothing - Seek on person evaluation if not improving or if symptoms worsen   Follow Up Instructions: I discussed the assessment and treatment plan with the patient. The patient was provided an opportunity to ask questions and all were answered. The patient agreed with the plan  and demonstrated an understanding of the instructions.  A copy of instructions were sent to the patient via MyChart unless otherwise noted below.    The patient was advised to call back or seek an in-person evaluation if the symptoms worsen or if the condition fails to improve as anticipated.    Angelia Kelp, PA-C

## 2023-09-22 NOTE — Patient Instructions (Signed)
 Melanie Cisneros, thank you for joining Angelia Kelp, PA-C for today's virtual visit.  While this provider is not your primary care provider (PCP), if your PCP is located in our provider database this encounter information will be shared with them immediately following your visit.   A [facility] MyChart account gives you access to today's visit and all your visits, tests, and labs performed at [facility] " click here if you don't have a [facility] MyChart account or go to [email or web address]  Consent: (Patient) Melanie Cisneros provided verbal consent for this virtual visit at the beginning of the encounter.  Current Medications:  Current Outpatient Medications:    fluconazole  (DIFLUCAN ) 150 MG tablet, Take 1 tablet (150 mg total) by mouth every 3 (three) days as needed., Disp: 2 tablet, Rfl: 0   clotrimazole -betamethasone  (LOTRISONE ) cream, Apply to affected area 2 times daily prn, Disp: 15 g, Rfl: 0   cyanocobalamin  1000 MCG tablet, Take 1 tablet (1,000 mcg total) by mouth daily., Disp: , Rfl:    cyclobenzaprine  (FLEXERIL ) 5 MG tablet, Take 1 tablet (5 mg total) by mouth at bedtime as needed for muscle spasms., Disp: 30 tablet, Rfl: 1   metroNIDAZOLE  (METROGEL ) 0.75 % vaginal gel, Place 1 Applicatorful vaginally at bedtime., Disp: 70 g, Rfl: 0   Multiple Vitamins-Minerals (BARIATRIC MULTIVITAMINS PO), Take 1 tablet by mouth daily., Disp: , Rfl:    Medications ordered in this encounter:  Meds ordered this encounter  Medications   fluconazole  (DIFLUCAN ) 150 MG tablet    Sig: Take 1 tablet (150 mg total) by mouth every 3 (three) days as needed.    Dispense:  2 tablet    Refill:  0    Supervising Provider:   LAMPTEY, PHILIP O [[id number]]     *If you need refills on other medications prior to your next appointment, please contact your pharmacy*  Follow-Up: Call back or seek an in-person evaluation if the symptoms worsen or if the condition fails to improve as  anticipated.  [facility] [phone]  Other Instructions Vaginal Yeast Infection, Adult  Vaginal yeast infection is a condition that causes vaginal discharge as well as soreness, swelling, and redness (inflammation) of the vagina. This is a common condition. Some women get this infection frequently. What are the causes? This condition is caused by a change in the normal balance of the yeast (Candida) and normal bacteria that live in the vagina. This change causes an overgrowth of yeast, which causes the inflammation. What increases the risk? The condition is more likely to develop in women who: Take antibiotic medicines. Have diabetes. Take birth control pills. Are pregnant. Douche often. Have a weak body defense system (immune system). Have been taking steroid medicines for a long time. Frequently wear tight clothing. What are the signs or symptoms? Symptoms of this condition include: White, thick, creamy vaginal discharge. Swelling, itching, redness, and irritation of the vagina. The lips of the vagina (labia) may be affected as well. Pain or a burning feeling while urinating. Pain during sex. How is this diagnosed? This condition is diagnosed based on: Your medical history. A physical exam. A pelvic exam. Your health care provider will examine a sample of your vaginal discharge under a microscope. Your health care provider may send this sample for testing to confirm the diagnosis. How is this treated? This condition is treated with medicine. Medicines may be over-the-counter or prescription. You may be told to use one or more of the following:  Medicine that is taken by mouth (orally). Medicine that is applied as a cream (topically). Medicine that is inserted directly into the vagina (suppository). Follow these instructions at home: Take or apply over-the-counter and prescription medicines only as told by your health care provider. Do not use tampons until  your health care provider approves. Do not have sex until your infection has cleared. Sex can prolong or worsen your symptoms of infection. Ask your health care provider when it is safe to resume sexual activity. Keep all follow-up visits. This is important. How is this prevented?  Do not wear tight clothes, such as pantyhose or tight pants. Wear breathable cotton underwear. Do not use douches, perfumed soap, creams, or powders. Wipe from front to back after using the toilet. If you have diabetes, keep your blood sugar levels under control. Ask your health care provider for other ways to prevent yeast infections. Contact a health care provider if: You have a fever. Your symptoms go away and then return. Your symptoms do not get better with treatment. Your symptoms get worse. You have new symptoms. You develop blisters in or around your vagina. You have blood coming from your vagina and it is not your menstrual period. You develop pain in your abdomen. Summary Vaginal yeast infection is a condition that causes discharge as well as soreness, swelling, and redness (inflammation) of the vagina. This condition is treated with medicine. Medicines may be over-the-counter or prescription. Take or apply over-the-counter and prescription medicines only as told by your health care provider. Do not douche. Resume sexual activity or use of tampons as instructed by your health care provider. Contact a health care provider if your symptoms do not get better with treatment or your symptoms go away and then return. This information is not intended to replace advice given to you by your health care provider. Make sure you discuss any questions you have with your health care provider. Document Revised: 07/20/2020 Document Reviewed: 07/20/2020 Elsevier Patient Education  2024 [employer]   If you have been instructed to have an in-person evaluation today at a local Urgent Care facility, please use the  link below. It will take you to a list of all of our available [facility], including address, phone number and hours of operation. Please do not delay care.  [facility]  If you or a family member do not have a primary care provider, use the link below to schedule a visit and establish care. When you choose a [facility] primary care physician or advanced practice provider, you gain a long-term partner in health. Find a Primary Care Provider  Learn more about [facility]'s in-office and virtual care options: [facility] - Get Care Now

## 2023-09-26 ENCOUNTER — Encounter: Payer: Medicaid Other | Admitting: Physical Therapy

## 2023-10-05 ENCOUNTER — Encounter: Payer: Self-pay | Admitting: Neurology

## 2023-10-05 ENCOUNTER — Encounter: Admitting: Neurology

## 2023-10-11 ENCOUNTER — Ambulatory Visit (HOSPITAL_BASED_OUTPATIENT_CLINIC_OR_DEPARTMENT_OTHER)

## 2023-10-13 ENCOUNTER — Ambulatory Visit: Admitting: Podiatry

## 2023-10-13 ENCOUNTER — Encounter: Payer: Self-pay | Admitting: Neurology

## 2023-10-13 ENCOUNTER — Encounter: Payer: Self-pay | Admitting: Podiatry

## 2023-10-13 VITALS — Ht 65.0 in | Wt 160.0 lb

## 2023-10-13 DIAGNOSIS — M2041 Other hammer toe(s) (acquired), right foot: Secondary | ICD-10-CM

## 2023-10-13 DIAGNOSIS — M2042 Other hammer toe(s) (acquired), left foot: Secondary | ICD-10-CM | POA: Diagnosis not present

## 2023-10-14 NOTE — Progress Notes (Signed)
 Subjective:   Patient ID: Melanie Cisneros, female   DOB: 33 y.o.   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

## 2023-10-16 ENCOUNTER — Other Ambulatory Visit

## 2023-10-18 ENCOUNTER — Telehealth: Admitting: Physician Assistant

## 2023-10-18 DIAGNOSIS — B9689 Other specified bacterial agents as the cause of diseases classified elsewhere: Secondary | ICD-10-CM | POA: Diagnosis not present

## 2023-10-18 DIAGNOSIS — T3695XA Adverse effect of unspecified systemic antibiotic, initial encounter: Secondary | ICD-10-CM

## 2023-10-18 DIAGNOSIS — B379 Candidiasis, unspecified: Secondary | ICD-10-CM | POA: Diagnosis not present

## 2023-10-18 DIAGNOSIS — N760 Acute vaginitis: Secondary | ICD-10-CM

## 2023-10-18 MED ORDER — FLUCONAZOLE 150 MG PO TABS: 150.0000 mg | ORAL_TABLET | ORAL | 0 refills | Status: DC | PRN

## 2023-10-18 MED ORDER — METRONIDAZOLE 500 MG PO TABS: 500.0000 mg | ORAL_TABLET | Freq: Two times a day (BID) | ORAL | 0 refills | Status: AC

## 2023-10-18 NOTE — Progress Notes (Signed)
 Virtual Visit Consent   Melanie Cisneros, you are scheduled for a virtual visit with a [facility] provider today. Just as with appointments in the office, your consent must be obtained to participate. Your consent will be active for this visit and any virtual visit you may have with one of our providers in the next 365 days. If you have a MyChart account, a copy of this consent can be sent to you electronically.  As this is a virtual visit, video technology does not allow for your provider to perform a traditional examination. This may limit your provider's ability to fully assess your condition. If your provider identifies any concerns that need to be evaluated in person or the need to arrange testing (such as labs, EKG, etc.), we will make arrangements to do so. Although advances in technology are sophisticated, we cannot ensure that it will always work on either your end or our end. If the connection with a video visit is poor, the visit may have to be switched to a telephone visit. With either a video or telephone visit, we are not always able to ensure that we have a secure connection.  By engaging in this virtual visit, you consent to the provision of healthcare and authorize for your insurance to be billed (if applicable) for the services provided during this visit. Depending on your insurance coverage, you may receive a charge related to this service.  I need to obtain your verbal consent now. Are you willing to proceed with your visit today? Melanie Cisneros has provided verbal consent on 10/18/2023 for a virtual visit (video or telephone). Angelia Kelp, PA-C  Date: 10/18/2023 6:14 PM   Virtual Visit via Video Note   I, Angelia Kelp, connected with  Melanie Cisneros  ([id number], 1991-04-05) on 10/18/23 at  6:00 PM EDT by a video-enabled telemedicine application and verified that I am speaking with the correct person using two identifiers.  Location: Patient: Virtual Visit  Location Patient: Mobile Provider: Virtual Visit Location Provider: Home Office   I discussed the limitations of evaluation and management by telemedicine and the availability of in person appointments. The patient expressed understanding and agreed to proceed.    History of Present Illness: Melanie Cisneros is a 33 y.o. who identifies as a female who was assigned female at birth, and is being seen today for vaginal discharge with an odor.  HPI: Vaginal Discharge The patient's primary symptoms include genital itching, a genital odor and vaginal discharge. This is a recurrent problem. The current episode started 1 to 4 weeks ago. The problem occurs constantly. The problem has been unchanged. The patient is experiencing no pain. Pertinent negatives include no abdominal pain, fever, flank pain, frequency, headaches, nausea or painful intercourse.     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   Paresthesia 05/18/2023   Orthostatic lightheadedness 09/13/2022   Hypokalemia 07/06/2022   Anxious mood 07/06/2022   Gonorrhea 11/09/2021   Ovarian cyst 11/09/2021   Bacterial vaginosis 05/13/2020   BMI 45.0-49.9, adult (HCC) 06/06/2016   Psychosocial stressors 02/25/2016   Ptyalism 02/25/2016   Rubella non-immune status, antepartum 01/27/2016    Allergies:  Allergies  Allergen Reactions   Amoxil [Amoxicillin] Anaphylaxis and Hives   Penicillins Anaphylaxis and Hives   Pork Allergy Other (See Comments)    Pt has tolerated multiple doses  of heparin   Medications:  Current Outpatient Medications:    fluconazole  (DIFLUCAN ) 150 MG tablet, Take 1 tablet (150 mg total) by mouth every 3 (three) days as needed., Disp: 3 tablet, Rfl: 0   metroNIDAZOLE  (FLAGYL ) 500 MG tablet, Take 1 tablet (500 mg total) by mouth 2 (two) times daily for 7 days., Disp: 14 tablet, Rfl: 0    clotrimazole -betamethasone  (LOTRISONE ) cream, Apply to affected area 2 times daily prn, Disp: 15 g, Rfl: 0   cyanocobalamin  1000 MCG tablet, Take 1 tablet (1,000 mcg total) by mouth daily., Disp: , Rfl:    cyclobenzaprine  (FLEXERIL ) 5 MG tablet, Take 1 tablet (5 mg total) by mouth at bedtime as needed for muscle spasms., Disp: 30 tablet, Rfl: 1   Multiple Vitamins-Minerals (BARIATRIC MULTIVITAMINS PO), Take 1 tablet by mouth daily., Disp: , Rfl:   Observations/Objective: Patient is well-developed, well-nourished in no acute distress.  Resting comfortably Head is normocephalic, atraumatic.  No labored breathing.  Speech is clear and coherent with logical content.  Patient is alert and oriented at baseline.    Assessment and Plan: 1. BV (bacterial vaginosis) (Primary) - metroNIDAZOLE  (FLAGYL ) 500 MG tablet; Take 1 tablet (500 mg total) by mouth 2 (two) times daily for 7 days.  Dispense: 14 tablet; Refill: 0  2. Antibiotic-induced yeast infection - fluconazole  (DIFLUCAN ) 150 MG tablet; Take 1 tablet (150 mg total) by mouth every 3 (three) days as needed.  Dispense: 3 tablet; Refill: 0  - Symptoms consistent with BV - Metronidazole  prescribed - Limit bubble baths, scented lotions/soaps/detergents - Limit tight fitting clothing - Diflucan  given as prophylaxis as patient tends to get vaginal yeast infections with antibiotic use - Seek on person evaluation if not improving or if symptoms worsen   Follow Up Instructions: I discussed the assessment and treatment plan with the patient. The patient was provided an opportunity to ask questions and all were answered. The patient agreed with the plan and demonstrated an understanding of the instructions.  A copy of instructions were sent to the patient via MyChart unless otherwise noted below.    The patient was advised to call back or seek an in-person evaluation if the symptoms worsen or if the condition fails to improve as anticipated.     Angelia Kelp, PA-C

## 2023-10-18 NOTE — Patient Instructions (Signed)
 Melanie Cisneros, thank you for joining Angelia Kelp, PA-C for today's virtual visit.  While this provider is not your primary care provider (PCP), if your PCP is located in our provider database this encounter information will be shared with them immediately following your visit.   A [facility] account gives you access to today's visit and all your visits, tests, and labs performed at [facility] " click here if you don't have a Melanie Cisneros account or go to [email or web address]  Consent: (Patient) Melanie Cisneros provided verbal consent for this virtual visit at the beginning of the encounter.  Current Medications:  Current Outpatient Medications:    fluconazole  (DIFLUCAN ) 150 MG tablet, Take 1 tablet (150 mg total) by mouth every 3 (three) days as needed., Disp: 3 tablet, Rfl: 0   metroNIDAZOLE  (FLAGYL ) 500 MG tablet, Take 1 tablet (500 mg total) by mouth 2 (two) times daily for 7 days., Disp: 14 tablet, Rfl: 0   clotrimazole -betamethasone  (LOTRISONE ) cream, Apply to affected area 2 times daily prn, Disp: 15 g, Rfl: 0   cyanocobalamin  1000 MCG tablet, Take 1 tablet (1,000 mcg total) by mouth daily., Disp: , Rfl:    cyclobenzaprine  (FLEXERIL ) 5 MG tablet, Take 1 tablet (5 mg total) by mouth at bedtime as needed for muscle spasms., Disp: 30 tablet, Rfl: 1   Multiple Vitamins-Minerals (BARIATRIC MULTIVITAMINS PO), Take 1 tablet by mouth daily., Disp: , Rfl:    Medications ordered in this encounter:  Meds ordered this encounter  Medications   metroNIDAZOLE  (FLAGYL ) 500 MG tablet    Sig: Take 1 tablet (500 mg total) by mouth 2 (two) times daily for 7 days.    Dispense:  14 tablet    Refill:  0    Supervising Provider:   LAMPTEY, PHILIP O [[id number]]   fluconazole  (DIFLUCAN ) 150 MG tablet    Sig: Take 1 tablet (150 mg total) by mouth every 3 (three) days as needed.    Dispense:  3 tablet    Refill:  0    Supervising Provider:   LAMPTEY, PHILIP O  [[id number]]     *If you need refills on other medications prior to your next appointment, please contact your pharmacy*  Follow-Up: Call back or seek an in-person evaluation if the symptoms worsen or if the condition fails to improve as anticipated.  [facility] [phone]  Other Instructions  Vaginal Probiotics: AZO vaginal probiotic OLLY Happy Hoo-Ha RAW Vaginal Care RenewLife Women's vaginal probiotic RepHresh Pro-B  Vaginal washes: Honey Pot Summer's Eve Vagisil Feminine cleanser  Boric Acid Suppositories  Healthy vaginal hygiene practices    -  Avoid sleeper pajamas. Nightgowns allow air to circulate.  Sleep without underpants whenever possible.   -  Wear cotton underpants during the day. Double-rinse underwear after washing to avoid residual irritants. Do not use fabric softeners for underwear and swimsuits.   - Avoid tights, leotards, leggings, "skinny" jeans, and other tight-fitting clothing. Skirts and loose-fitting pants allow air to circulate.   - Avoid pantyliners.  Instead use tampons or cotton pads.   - Use the restroom after intercourse to help prevent UTI's   - Daily warm bathing is helpful:     - Soak in clean water  (no soap) for 10 to 15 minutes. Adding vinegar or baking soda to the water  has not been specifically studied and may not be better than clean water  alone.      - Use soap to wash regions other  than the genital area just before getting out of the tub. Limit use of any soap on genital areas. Use fragance-free soaps.     - Rinse the genital area well and gently pat dry.  Don't rub.  Hair dryer to assist with drying can be used only if on cool setting.     - Do not use bubble baths or perfumed soaps.   - Do not use any feminine sprays, douches or powders.  These contain chemicals that will irritate the skin.   - If the genital area is tender or swollen, cool compresses may relieve the discomfort. Unscented wet wipes can be used  instead of toilet paper for wiping.    - Emollients, such as Vaseline, may help protect skin and can be applied to the irritated area.   - Always remember to wipe front-to-back after bowel movements. Pat dry after urination.   - Do not sit in wet swimsuits for long periods of time after swimming    If you have been instructed to have an in-person evaluation today at a local Urgent Care facility, please use the link below. It will take you to a list of all of our available [facility], including address, phone number and hours of operation. Please do not delay care.  [facility]  If you or a family member do not have a primary care provider, use the link below to schedule a visit and establish care. When you choose a [facility] primary care physician or advanced practice provider, you gain a long-term partner in health. Find a Primary Care Provider  Learn more about [facility] in-office and virtual care options:  - Get Care Now

## 2023-10-19 ENCOUNTER — Ambulatory Visit (HOSPITAL_COMMUNITY)

## 2023-10-20 ENCOUNTER — Other Ambulatory Visit

## 2023-10-27 ENCOUNTER — Other Ambulatory Visit

## 2023-10-29 ENCOUNTER — Other Ambulatory Visit

## 2023-11-02 ENCOUNTER — Ambulatory Visit
Admission: RE | Admit: 2023-11-02 | Discharge: 2023-11-02 | Discharge status: Home / Self Care | Source: Ambulatory Visit | Attending: Neurology

## 2023-11-02 DIAGNOSIS — G373 Acute transverse myelitis in demyelinating disease of central nervous system: Secondary | ICD-10-CM

## 2023-11-02 DIAGNOSIS — R200 Anesthesia of skin: Secondary | ICD-10-CM

## 2023-11-02 DIAGNOSIS — M50223 Other cervical disc displacement at C6-C7 level: Secondary | ICD-10-CM | POA: Diagnosis not present

## 2023-11-02 MED ORDER — GADOPICLENOL 0.5 MMOL/ML IV SOLN
7.0000 mL | Freq: Once | INTRAVENOUS | Status: AC | PRN
  Administered 2023-11-02: 7 mL via INTRAVENOUS

## 2023-11-03 ENCOUNTER — Ambulatory Visit: Admitting: Family Medicine

## 2023-11-03 NOTE — Progress Notes (Deleted)
   SUBJECTIVE:   CHIEF COMPLAINT / HPI:  Melanie Cisneros is a 33 y.o. 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 Physical Exam General: Age-appropriate, resting comfortably in chair, NAD, alert and at baseline. HEENT:  Head: Normocephalic, atraumatic. No tenderness to percussion over sinuses. Eyes: PERRLA. No conjunctival erythema or scleral injections. Ears: TMs non-bulging and non-erythematous bilaterally. No erythema of external ear canal. No cerumen impaction. Nose: Non-erythematous turbinates. No rhinorrhea. Mouth/Oral: Clear, no tonsillar exudate. MMM. Neck: Supple. No LAD. Cardiovascular: Regular rate and rhythm. Normal S1/S2. No murmurs, rubs, or gallops appreciated. 2+ radial pulses. Pulmonary: Clear bilaterally to ascultation. No wheezes, crackles, or rhonchi. Normal WOB on room air. No accessory muscle use. Abdominal: No tenderness to deep or light palpation. No rebound or guarding. No HSM. Skin: Warm and dry. Extremities: No peripheral edema bilaterally. Capillary refill <2 seconds.  No results found for this or any previous visit (from the past 48 hours).     05/18/2023    4:04 PM  Depression screen PHQ 2/9  Decreased Interest 0  Down, Depressed, Hopeless 0  PHQ - 2 Score 0     ASSESSMENT/PLAN:   Assessment & Plan   No follow-ups on file.  Mathis Cashman Lansing Planas, MD [facility]

## 2023-11-13 ENCOUNTER — Encounter: Payer: Self-pay | Admitting: Family Medicine

## 2023-11-13 ENCOUNTER — Encounter: Payer: Self-pay | Admitting: Neurology

## 2023-11-13 ENCOUNTER — Telehealth: Admitting: Family Medicine

## 2023-11-13 ENCOUNTER — Telehealth: Payer: Self-pay | Admitting: Neurology

## 2023-11-13 ENCOUNTER — Ambulatory Visit: Admitting: Neurology

## 2023-11-13 DIAGNOSIS — N898 Other specified noninflammatory disorders of vagina: Secondary | ICD-10-CM

## 2023-11-13 NOTE — Telephone Encounter (Signed)
 Results are not available. Patient welcome to come to appointment for f/u. We will call once results are received.

## 2023-11-13 NOTE — Progress Notes (Signed)
 [place]   BV recurrent- needs swab since treated in last 30 days. In person is recommended.  Patient acknowledged agreement and understanding of the plan.

## 2023-11-13 NOTE — Telephone Encounter (Signed)
 Called patient and informed her results are not yet available but can call to request results to be resulted. Patient stated that she can't keep leaving work and would like to reschedule. Patient aware we will call her results. Patient aware I will have someone from the front give her a call to get her scheduled.

## 2023-11-13 NOTE — Telephone Encounter (Signed)
 Pt called in wanting to double check if her MRI results are in? If not she is wanting to see if she should still leave work early for her appointment today?

## 2023-11-15 ENCOUNTER — Ambulatory Visit: Payer: Self-pay | Admitting: Neurology

## 2023-12-07 ENCOUNTER — Telehealth: Admitting: Physician Assistant

## 2023-12-07 DIAGNOSIS — B9689 Other specified bacterial agents as the cause of diseases classified elsewhere: Secondary | ICD-10-CM

## 2023-12-07 DIAGNOSIS — N760 Acute vaginitis: Secondary | ICD-10-CM

## 2023-12-07 MED ORDER — FLUCONAZOLE 150 MG PO TABS: ORAL_TABLET | ORAL | 0 refills | Status: DC

## 2023-12-07 MED ORDER — METRONIDAZOLE 500 MG PO TABS: 500.0000 mg | ORAL_TABLET | Freq: Two times a day (BID) | ORAL | 0 refills | Status: DC

## 2023-12-07 NOTE — Progress Notes (Signed)
 Virtual Visit Consent   Melanie Cisneros, you are scheduled for a virtual visit with a [facility] provider today. Just as with appointments in the office, your consent must be obtained to participate. Your consent will be active for this visit and any virtual visit you may have with one of our providers in the next 365 days. If you have a MyChart account, a copy of this consent can be sent to you electronically.  As this is a virtual visit, video technology does not allow for your provider to perform a traditional examination. This may limit your provider's ability to fully assess your condition. If your provider identifies any concerns that need to be evaluated in person or the need to arrange testing (such as labs, EKG, etc.), we will make arrangements to do so. Although advances in technology are sophisticated, we cannot ensure that it will always work on either your end or our end. If the connection with a video visit is poor, the visit may have to be switched to a telephone visit. With either a video or telephone visit, we are not always able to ensure that we have a secure connection.  By engaging in this virtual visit, you consent to the provision of healthcare and authorize for your insurance to be billed (if applicable) for the services provided during this visit. Depending on your insurance coverage, you may receive a charge related to this service.  I need to obtain your verbal consent now. Are you willing to proceed with your visit today? Melanie Cisneros has provided verbal consent on 12/07/2023 for a virtual visit (video or telephone). Melanie Cisneros, [place]  Date: 12/07/2023 6:08 PM   Virtual Visit via Video Note   I, Melanie Cisneros, connected with  Melanie Cisneros  ([id number], 05-05-1991) on 12/07/23 at  6:00 PM EDT by a video-enabled telemedicine application and verified that I am speaking with the correct person using two identifiers.  Location: Patient: Virtual Visit  Location Patient: Home Provider: Virtual Visit Location Provider: Home Office   I discussed the limitations of evaluation and management by telemedicine and the availability of in person appointments. The patient expressed understanding and agreed to proceed.    History of Present Illness: Melanie Cisneros is a 33 y.o. who identifies as a female who was assigned female at birth, and is being seen today for concern for continued bacterial vaginosis. Was treated at beginning of last month for supsected BV, initially with metrogel . She used just for a few days but had to stop due to severe irritation and itching. Was transitioned to Metronidazole  tablets. Notes she started taking and tolerated well. Unfortunately during a trip, she lost her Rx and so only completed a few days of treatment. Is still noting fishy odor and mild vaginal irritation.  New body wash/lotion she things is triggering it. Denies fevers, chills, abdominopelvic pain. Denies concern for pregnancy. Not currently sexually active.     HPI: HPI  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   Paresthesia 05/18/2023   Orthostatic lightheadedness 09/13/2022   Hypokalemia 07/06/2022   Anxious mood 07/06/2022   Gonorrhea 11/09/2021   Ovarian cyst 11/09/2021   Bacterial vaginosis 05/13/2020   BMI 45.0-49.9, adult (HCC) 06/06/2016   Psychosocial stressors 02/25/2016   Ptyalism 02/25/2016   Rubella non-immune status, antepartum 01/27/2016    Allergies:  Allergies  Allergen Reactions   Amoxil [Amoxicillin] Anaphylaxis and Hives   Penicillins Anaphylaxis and Hives   Pork Allergy Other (See Comments)    Pt has tolerated multiple doses of heparin   Medications:  Current Outpatient Medications:    fluconazole  (DIFLUCAN ) 150 MG tablet, Take 1 tablet PO once. Repeat in 3 days if needed., Disp: 2 tablet,  Rfl: 0   metroNIDAZOLE  (FLAGYL ) 500 MG tablet, Take 1 tablet (500 mg total) by mouth 2 (two) times daily., Disp: 14 tablet, Rfl: 0   clotrimazole -betamethasone  (LOTRISONE ) cream, Apply to affected area 2 times daily prn, Disp: 15 g, Rfl: 0   cyanocobalamin  1000 MCG tablet, Take 1 tablet (1,000 mcg total) by mouth daily., Disp: , Rfl:    cyclobenzaprine  (FLEXERIL ) 5 MG tablet, Take 1 tablet (5 mg total) by mouth at bedtime as needed for muscle spasms., Disp: 30 tablet, Rfl: 1   Multiple Vitamins-Minerals (BARIATRIC MULTIVITAMINS PO), Take 1 tablet by mouth daily., Disp: , Rfl:   Observations/Objective: Patient is well-developed, well-nourished in no acute distress.  Resting comfortably  at home.  Head is normocephalic, atraumatic.  No labored breathing.  Speech is clear and coherent with logical content.  Patient is alert and oriented at baseline.   Assessment and Plan: 1. Bacterial vaginosis (Primary) - metroNIDAZOLE  (FLAGYL ) 500 MG tablet; Take 1 tablet (500 mg total) by mouth 2 (two) times daily.  Dispense: 14 tablet; Refill: 0  Giving incomplete treatment course and classic symptoms, will provide a new script for Flagyl  BID x 7 days. Supportive measures reviewed. She is aware for any non-resolving or worsening symptoms despite treatment, she MUST be evaluated at in person facility.   Follow Up Instructions: I discussed the assessment and treatment plan with the patient. The patient was provided an opportunity to ask questions and all were answered. The patient agreed with the plan and demonstrated an understanding of the instructions.  A copy of instructions were sent to the patient via MyChart unless otherwise noted below.   The patient was advised to call back or seek an in-person evaluation if the symptoms worsen or if the condition fails to improve as anticipated.    Melanie Velma Lunger, PA-C

## 2023-12-07 NOTE — Patient Instructions (Signed)
 Melanie Cisneros, thank you for joining Melanie Velma Lunger, PA-C for today's virtual visit.  While this provider is not your primary care provider (PCP), if your PCP is located in our provider database this encounter information will be shared with them immediately following your visit.   A [facility] MyChart account gives you access to today's visit and all your visits, tests, and labs performed at [facility]  click here if you don't have a [facility] MyChart account or go to [email or web address]  Consent: (Patient) Melanie Cisneros provided verbal consent for this virtual visit at the beginning of the encounter.  Current Medications:  Current Outpatient Medications:    fluconazole  (DIFLUCAN ) 150 MG tablet, Take 1 tablet PO once. Repeat in 3 days if needed., Disp: 2 tablet, Rfl: 0   metroNIDAZOLE  (FLAGYL ) 500 MG tablet, Take 1 tablet (500 mg total) by mouth 2 (two) times daily., Disp: 14 tablet, Rfl: 0   clotrimazole -betamethasone  (LOTRISONE ) cream, Apply to affected area 2 times daily prn, Disp: 15 g, Rfl: 0   cyanocobalamin  1000 MCG tablet, Take 1 tablet (1,000 mcg total) by mouth daily., Disp: , Rfl:    cyclobenzaprine  (FLEXERIL ) 5 MG tablet, Take 1 tablet (5 mg total) by mouth at bedtime as needed for muscle spasms., Disp: 30 tablet, Rfl: 1   Multiple Vitamins-Minerals (BARIATRIC MULTIVITAMINS PO), Take 1 tablet by mouth daily., Disp: , Rfl:    Medications ordered in this encounter:  Meds ordered this encounter  Medications   metroNIDAZOLE  (FLAGYL ) 500 MG tablet    Sig: Take 1 tablet (500 mg total) by mouth 2 (two) times daily.    Dispense:  14 tablet    Refill:  0    Supervising Provider:   LAMPTEY, PHILIP O [[id number]]   fluconazole  (DIFLUCAN ) 150 MG tablet    Sig: Take 1 tablet PO once. Repeat in 3 days if needed.    Dispense:  2 tablet    Refill:  0    Supervising Provider:   LAMPTEY, PHILIP O [[id number]]     *If you need refills on other medications  prior to your next appointment, please contact your pharmacy*  Follow-Up: Call back or seek an in-person evaluation if the symptoms worsen or if the condition fails to improve as anticipated.  [facility] [phone]  Other Instructions Vaginal Infection (Bacterial Vaginosis): What to Know  Bacterial vaginosis is an infection of the vagina. It happens when the balance of normal germs (bacteria) in the vagina changes. If you don't get treated, it can make it easier for you to get other infections from sex. These are called sexually transmitted infections (STIs). If you're pregnant, you need to get treated right away. This infection can cause a baby to be born early or at a low birth weight. What are the causes? This infection happens when too many harmful germs grow in the vagina. You can't get this infection from toilet seats, bedsheets, swimming pools, or things that touch your vagina. What increases the risk? Having sex with a new person or more than one person. Having sex without protection. Douching. Having an intrauterine device (IUD). Smoking. Using drugs or drinking alcohol. These can lead you to do risky things. Taking certain antibiotics. Being pregnant. What are the signs or symptoms? Some females have no symptoms. Symptoms may include: A gray or white discharge from your vagina. It can be watery or foamy. A fishy smell. This can happen after sex or during your menstrual period.  Itching in and around your vagina. Burning or pain when you pee. How is this treated? This infection is treated with antibiotics. These may be given to you as: A pill. A cream for your vagina. A medicine that you put into your vagina (suppository). If the infection comes back, you may need more antibiotics. Follow these instructions at home: Medicines Take your medicines as told. Take or use your antibiotics as told. Do not stop using them even if you start to feel  better. General instructions If the person you have sex with is a female, tell her that you have this infection. She will need to follow up with her doctor. Female partners don't need to be treated. Do not have sex until you finish treatment. Drink more fluids as told. Keep your vagina and butt clean. Wash these areas with warm water  each day. Wipe from front to back after you poop. If you're breastfeeding a baby, talk to your doctor if you should keep doing so during treatment. How is this prevented? Self-care Do not douche. Do not use deodorant sprays on your vagina. Wear cotton underwear. Do not wear tight pants and pantyhose, especially in the summer. Safe sex Use condoms the correct way and every time you have sex. Use dental dams to protect yourself during oral sex. Limit how many people you have sex with. Get tested for STIs. The person you have sex with should also get tested. Drugs and alcohol Do not smoke, vape, or use nicotine or tobacco. Do not use drugs. Limit the amount of alcohol you drink because it can lead you to do risky things. Where to find more information To learn more: Go to TonerPromos.no. Click Health Topics A-Z. Type bacterial vaginosis in the search bar. American Sexual Health Association (ASHA): ashasexualhealth.org U.S. Department of Health and [employer], Office on Women's Health: TravelLesson.ca Contact a doctor if: Your symptoms don't get better, even after treatment. You have more discharge or pain when you pee. You have a fever or chills. You have pain in your belly or in the area between your hips. You have pain during sex. You bleed from your vagina between menstrual periods. This information is not intended to replace advice given to you by your health care provider. Make sure you discuss any questions you have with your health care provider. Document Revised: 10/19/2022 Document Reviewed: 10/19/2022 Elsevier Patient Education  2024  Elsevier Inc.   If you have been instructed to have an in-person evaluation today at a local Urgent Care facility, please use the link below. It will take you to a list of all of our available [facility], including address, phone number and hours of operation. Please do not delay care.  [facility]  If you or a family member do not have a primary care provider, use the link below to schedule a visit and establish care. When you choose a [facility] primary care physician or advanced practice provider, you gain a long-term partner in health. Find a Primary Care Provider  Learn more about [facility] in-office and virtual care options: [facility] - Get Care Now

## 2023-12-11 ENCOUNTER — Encounter: Payer: Self-pay | Admitting: Neurology

## 2023-12-11 ENCOUNTER — Ambulatory Visit (INDEPENDENT_AMBULATORY_CARE_PROVIDER_SITE_OTHER): Admitting: Neurology

## 2023-12-11 VITALS — BP 110/71 | HR 84 | Ht 65.0 in | Wt 164.0 lb

## 2023-12-11 DIAGNOSIS — G373 Acute transverse myelitis in demyelinating disease of central nervous system: Secondary | ICD-10-CM | POA: Diagnosis not present

## 2023-12-11 NOTE — Progress Notes (Signed)
 Follow-up Visit   Date: 12/11/2023    NANCEY KREITZ MRN: [id number] DOB: [birth year]    Lurlean JONETTA Choung is a 33 y.o. 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  The patient was accompanied to the clinic by self.   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  CSF testing shows mildly elevated IgG index with no oligoclonal bands in the CSF.  With MRI brain being normal and no other cord abnormalities, she does not meet criteria for multiple sclerosis.  Repeat MRI cervical spine was viewed with patient and shows near complete resolution of previously seen cord abnormality.  Clinically, her paresthesia are also markedly improved.  Will continue to monitor.  No need for additional imaging, unless she has new neurological symptoms.   Return to clinic in 6 months  --------------------------------------------- History of present illness: In January, she began having right leg numbness and weakness and gets vibration sensation in the right leg and twinges of pain in the back.  She also started having numbness involving the right 4th and 5th fingers.  She saw her PCP for these symptoms, who ordered MRI brain and cervical spine.  Imaging shows T2 hyperintensity involving the dorsal upper cervical cord extending to the cervicomedullary junction.  Repeat imaging with contrast showed mild patchy enhancement. MRI brain was normal and MRI thoracic spine showed normal cord signal.  She underwent CSF testing which shows normal cell count, protein, and glucose.  IgG index is elevated 0.9.  Oligoclonal bands are pending.  Labs including ESR, vitamin B12, folate, vitamin E, copper , zinc , ACE, RPR, and HIV was normal. Anti-MOG and NMO is negative.  Due to concern of an inflammatory process, she was administered IV methylprednisolone  1g x 3 days.  Overall, she feels that her right leg weakness and  twinges of pain has improved.  She still has vibration sensation in the thigh and no longer has numbness in the right hand.    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  Nonsmoker.  No drug or alcohol use.  UPDATE 08/09/2023:  She is here to discuss CSF results.  Oligoclonal bands in the CSF were absent.  She did have bands in the serum consistent with inflammatory response.  She has been doing well.  She no longer has vibration sensation in the thigh, but continues to have it in the hand and lower legs.  Symptoms are improved with flexeril . No new complaints.   UPDATE 12/11/2023:  She is here for follow-up visit.  She reports doing much better and rarely has numbness/tingling involving the right arm and leg.  She is able to flex her neck and no longer has numbness down the spine.  No new complaints.   Medications:  Current Outpatient Medications on File Prior to Visit  Medication Sig Dispense Refill   clotrimazole -betamethasone  (LOTRISONE ) cream Apply to affected area 2 times daily prn 15 g 0   cyanocobalamin  1000 MCG tablet Take 1 tablet (1,000 mcg total) by mouth daily.     cyclobenzaprine  (FLEXERIL ) 5 MG tablet Take 1 tablet (5 mg total) by mouth at bedtime as needed for muscle spasms. 30 tablet 1   fluconazole  (DIFLUCAN ) 150 MG tablet Take 1 tablet PO once. Repeat in 3 days if needed. 2 tablet 0   metroNIDAZOLE  (FLAGYL ) 500 MG tablet Take 1 tablet (500 mg total) by mouth 2 (two) times daily. 14 tablet 0   Multiple Vitamins-Minerals (  BARIATRIC MULTIVITAMINS PO) Take 1 tablet by mouth daily.     No current facility-administered medications on file prior to visit.    Allergies:  Allergies  Allergen Reactions   Amoxil [Amoxicillin] Anaphylaxis and Hives   Penicillins Anaphylaxis and Hives   Pork Allergy Other (See Comments)    Pt has tolerated multiple doses of heparin    Vital Signs:  BP 110/71   Pulse 84   Ht 5' 5 (1.651 m)   Wt 164 lb (74.4 kg)   SpO2 98%   BMI 27.29 kg/m    Neurological Exam: MENTAL STATUS including orientation to time, place, person, recent and remote memory, attention span and concentration, language, and fund of knowledge is normal.  Speech is not dysarthric.  CRANIAL NERVES:  No visual field defects.  Pupils equal round and reactive to light.  Normal conjugate, extra-ocular eye movements in all directions of gaze.  No ptosis.  Face is symmetric. Palate elevates symmetrically.  Tongue is midline.  MOTOR:  Motor strength is 5/5 in all extremities.  No atrophy, fasciculations or abnormal movements.  No pronator drift.  Tone is normal.    MSRs:  Reflexes are 2+/4 throughout.  SENSORY:  Intact to vibration throughout.  COORDINATION/GAIT:  Normal finger-to- nose-finger.  Intact rapid alternating movements bilaterally.  Gait narrow based and stable.   Data: CSF 07/16/2023:  R1 W6 G52, P18, IgG index 0.9*, OCB pending AntiMOG, NMO - neg Labs 07/16/2023:  vitamin B1 78, ceruloplasmin 19, vitamin E 7.8, HIV neg, RPR neg, vitamin B12 442, folate 27, copper , zinc  73   MRI thoracic spine wo contrast 07/16/2023: 1. Normal MRI appearance of the thoracic spinal cord. No abnormal enhancement. 2. Minor spondylosis at T4-5 through T7-8 without significant stenosis or neural impingement as above.   MRI cervical spine 07/16/2023: 1. Patchy post-contrast enhancement at the dorsal aspect of the upper cervical cord at the level of the cervicomedullary junction, corresponding with signal abnormality seen on prior noncontrast MRI. Differential considerations as previously described. 2. Otherwise unremarkable and normal post-contrast MRI of the cervical spine and spinal cord.   MRI cervical spine  07/14/2023: 1. T2 hyperintense signal abnormality within the dorsal aspect of the upper cervical spinal cord (at the C2 level) and extending to the cervicomedullary junction, spanning 2.6 cm in craniocaudal dimension. Differential considerations include  inflammatory demyelination (MS, NMO), subacute combined degeneration, copper  deficiency, sequela viral infection and syphilis/tabes dorsalis, among others. Post-contrast MR imaging of the cervical spine is recommended for further evaluation. An MRI of the thoracic spine (with and without contrast) should also be considered. 2. Cervical spondylosis as outlined within the body of the report. No more than mild relative spinal canal narrowing. No significant foraminal stenosis. Disc degeneration is greatest at C6-C7 (moderate at this level).   MRI cervical spine 11/14/2023: 1. Subtle residual cord signal abnormality involving the dorsal aspect of the upper cervical cord at the levels of C1 and C2, improved from prior, and now only faintly visible. No abnormal enhancement. 2. Mild noncompressive disc bulging at C6-7 without stenosis or impingement.  MRI brain 07/16/2023:  No abnormal enhancement within the brain itself.    Thank you for allowing me to participate in patient's care.  If I can answer any additional questions, I would be pleased to do so.    Sincerely,    Shantoria Ellwood K. Tobie, DO

## 2023-12-26 ENCOUNTER — Ambulatory Visit (HOSPITAL_COMMUNITY)
Admission: RE | Admit: 2023-12-26 | Discharge: 2023-12-26 | Disposition: A | Discharge status: Home / Self Care | Source: Ambulatory Visit | Attending: Family Medicine | Admitting: Family Medicine

## 2023-12-26 ENCOUNTER — Encounter (HOSPITAL_COMMUNITY): Payer: Self-pay

## 2023-12-26 VITALS — BP 108/71 | HR 78 | Temp 98.0°F | Resp 15

## 2023-12-26 DIAGNOSIS — Z20822 Contact with and (suspected) exposure to covid-19: Secondary | ICD-10-CM | POA: Diagnosis present

## 2023-12-26 DIAGNOSIS — N898 Other specified noninflammatory disorders of vagina: Secondary | ICD-10-CM

## 2023-12-26 LAB — POC SARS CORONAVIRUS 2 AG -  ED: SARS Coronavirus 2 Ag: NEGATIVE

## 2023-12-26 NOTE — ED Triage Notes (Signed)
 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 Denies any symptoms. Reports little fatigued but doesn't know if from working the weekend or not.

## 2023-12-26 NOTE — Discharge Instructions (Addendum)
 You were seen today for covid testing after close exposure.  Your covid swab was negative today.   Your vaginal swab will be resulted tomorrow. You will see these results on mychart and you will be notified if anything is positive for treatment.

## 2023-12-26 NOTE — ED Provider Notes (Signed)
 [facility]    CSN: [id number] Arrival date & time: 12/26/23  1339      History   Chief Complaint Chief Complaint  Patient presents with   Appointment    HPI Melanie Cisneros is a 33 y.o. female.   Patient is here for covid testing.  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  He did quarantine and wear a mask in the home, but she was with him until yesterday.  She did have a mild headache and scratchy throat with some fatigue yesterday, but today feels much better.  No other symptoms   She also requests a vaginal swab for BV.  She recently finished treatment for BV, and would like a test to make sure this has cleared.        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   Paresthesia 05/18/2023   Orthostatic lightheadedness 09/13/2022   Hypokalemia 07/06/2022   Anxious mood 07/06/2022   Gonorrhea 11/09/2021   Ovarian cyst 11/09/2021   Bacterial vaginosis 05/13/2020   BMI 45.0-49.9, adult (HCC) 06/06/2016   Psychosocial stressors 02/25/2016   Ptyalism 02/25/2016   Rubella non-immune status, antepartum 01/27/2016    Past Surgical History:  Procedure Laterality Date   GASTRIC BYPASS     WISDOM TOOTH EXTRACTION      OB History     Gravida  1   Para  1   Term  1   Preterm  0   AB  0   Living  1      SAB  0   IAB  0   Ectopic  0   Multiple  0   Live Births  1             Home Medications    Prior to Admission medications   Medication Sig Start Date End Date Taking? Authorizing Provider  clotrimazole -betamethasone  (LOTRISONE ) cream Apply to affected area 2 times daily prn 09/15/23   Darral Longs, MD  cyanocobalamin  1000 MCG tablet Take 1 tablet (1,000 mcg total) by mouth daily. 07/18/23   Shitarev, Dimitry, MD  cyclobenzaprine  (FLEXERIL ) 5 MG tablet Take 1 tablet (5 mg total) by mouth at bedtime as needed for muscle spasms. 07/24/23   Patel, Donika K, DO  fluconazole  (DIFLUCAN ) 150 MG tablet Take 1 tablet PO once. Repeat in 3 days if needed. 12/07/23   Gladis Elsie BROCKS, PA-C  metroNIDAZOLE  (FLAGYL ) 500 MG tablet Take 1 tablet (500 mg total) by mouth 2 (two) times daily. 12/07/23   Gladis Elsie BROCKS, PA-C  Multiple Vitamins-Minerals (BARIATRIC MULTIVITAMINS PO) Take 1 tablet by mouth daily.    [provider]    Family History Family History  Problem Relation Age of Onset   Diabetes Father  Hypertension Mother     Social History Social History   Tobacco Use   Smoking status: Never   Smokeless tobacco: Never  Vaping Use   Vaping status: Never Used  Substance Use Topics   Alcohol use: Not Currently   Drug use: No     Allergies   Amoxil [amoxicillin], Penicillins, and Pork allergy   Review of Systems Review of Systems  Constitutional: Negative.   HENT: Negative.    Respiratory: Negative.    Cardiovascular: Negative.   Gastrointestinal: Negative.   Genitourinary: Negative.   Musculoskeletal: Negative.   Psychiatric/Behavioral: Negative.       Physical Exam Triage Vital Signs ED Triage Vitals  Encounter Vitals Group     BP 12/26/23 1356 108/71     Girls Systolic BP Percentile --      Girls Diastolic BP Percentile --      Boys Systolic BP Percentile --      Boys Diastolic BP Percentile --      Pulse Rate 12/26/23 1356 78     Resp 12/26/23 1356 15     Temp 12/26/23 1356 98 F (36.7 C)     Temp Source 12/26/23  1356 Oral     SpO2 12/26/23 1356 98 %     Weight --      Height --      Head Circumference --      Peak Flow --      Pain Score 12/26/23 1355 0     Pain Loc --      Pain Education --      Exclude from Growth Chart --    No data found.  Updated Vital Signs BP 108/71 (BP Location: Left Arm)   Pulse 78   Temp 98 F (36.7 C) (Oral)   Resp 15   LMP 12/13/2023 (Approximate)   SpO2 98%   Visual Acuity Right Eye Distance:   Left Eye Distance:   Bilateral Distance:    Right Eye Near:   Left Eye Near:    Bilateral Near:     Physical Exam Constitutional:      Appearance: Normal appearance. She is normal weight.  HENT:     Nose: Nose normal.     Mouth/Throat:     Mouth: Mucous membranes are moist.  Cardiovascular:     Rate and Rhythm: Normal rate and regular rhythm.  Pulmonary:     Effort: Pulmonary effort is normal.     Breath sounds: Normal breath sounds.  Neurological:     General: No focal deficit present.     Mental Status: She is alert.  Psychiatric:        Mood and Affect: Mood normal.      UC Treatments / Results  Labs (all labs ordered are listed, but only abnormal results are displayed) Labs Reviewed  POC SARS CORONAVIRUS 2 AG -  ED  CERVICOVAGINAL ANCILLARY ONLY   Covid negative  EKG   Radiology No results found.  Procedures Procedures (including critical care time)  Medications Ordered in UC Medications - No data to display  Initial Impression / Assessment and Plan / UC Course  I have reviewed the triage vital signs and the nursing notes.  Pertinent labs & imaging results that were available during my care of the patient were reviewed by me and considered in my medical decision making (see chart for details).   Final Clinical Impressions(s) / UC Diagnoses   Final diagnoses:  Close exposure to COVID-19 virus  Vaginal discharge     Discharge Instructions      You were seen today for covid testing after close exposure.  Your  covid swab was negative today.   Your vaginal swab will be resulted tomorrow. You will see these results on mychart and you will be notified if anything is positive for treatment.     ED Prescriptions   None    PDMP not reviewed this encounter.   Darral Longs, MD 12/26/23 1435

## 2023-12-27 LAB — CERVICOVAGINAL ANCILLARY ONLY
Bacterial Vaginitis (gardnerella): NEGATIVE
Candida Glabrata: NEGATIVE
Candida Vaginitis: NEGATIVE
Chlamydia: NEGATIVE
Comment: NEGATIVE
Comment: NEGATIVE
Comment: NEGATIVE
Comment: NEGATIVE
Comment: NEGATIVE
Comment: NORMAL
Neisseria Gonorrhea: NEGATIVE
Trichomonas: NEGATIVE

## 2023-12-29 ENCOUNTER — Ambulatory Visit

## 2024-01-01 ENCOUNTER — Encounter: Payer: Self-pay | Admitting: Family Medicine

## 2024-01-01 ENCOUNTER — Ambulatory Visit (INDEPENDENT_AMBULATORY_CARE_PROVIDER_SITE_OTHER): Admitting: Family Medicine

## 2024-01-01 VITALS — BP 112/62 | HR 71 | Ht 65.0 in | Wt 163.6 lb

## 2024-01-01 DIAGNOSIS — M25562 Pain in left knee: Secondary | ICD-10-CM | POA: Diagnosis not present

## 2024-01-01 DIAGNOSIS — G8929 Other chronic pain: Secondary | ICD-10-CM

## 2024-01-01 DIAGNOSIS — Z0000 Encounter for general adult medical examination without abnormal findings: Secondary | ICD-10-CM | POA: Diagnosis not present

## 2024-01-01 DIAGNOSIS — M25561 Pain in right knee: Secondary | ICD-10-CM

## 2024-01-01 DIAGNOSIS — J069 Acute upper respiratory infection, unspecified: Secondary | ICD-10-CM

## 2024-01-01 DIAGNOSIS — K219 Gastro-esophageal reflux disease without esophagitis: Secondary | ICD-10-CM | POA: Diagnosis not present

## 2024-01-01 MED ORDER — PANTOPRAZOLE SODIUM 40 MG PO TBEC: 40.0000 mg | DELAYED_RELEASE_TABLET | Freq: Every day | ORAL | 1 refills | Status: DC

## 2024-01-01 NOTE — Patient Instructions (Addendum)
 It was great to see you again today.  Go get xrays of knees Referring to sports medicine See handout below on patellofemoral syndrome  Sent in refill of acid reflux medicine  For information on vaccines, please look at [email or web address] This is an excellent, detailed website with lots of great, reliable information about vaccines.  Be well, Dr. Donah

## 2024-01-01 NOTE — Progress Notes (Signed)
  Date of Visit: 01/01/2024   SUBJECTIVE:   HPI:  Discussed the use of AI scribe software for clinical note transcription with the patient, who gave verbal consent to proceed.  History of Present Illness Melanie Cisneros is a 33 year old female who presents initially for pap, however is not due for pap yet. We elected to discuss knee pain and recent respiratory symptoms.  Knee pain and mechanical symptoms - Knee pain present since her twenties, characterized by cracking and popping noises - Pain aggravated by squatting and running - Sensation of a 'little ball' or tissue moving around in the lateral aspect of the left knee - Pain primarily located on anterior portion of knees around bilateral patellae - Active lifestyle and regular workouts limited by knee pain - Family history of arthritis  Acute respiratory symptoms - Cough and chest discomfort onset one day prior to visit - Exposure to clients with COVID-19 and pneumonia - Negative COVID-19 test four to five days ago [facility] - Cough productive of mucus - No fever  Gastroesophageal reflux disease - History of acid reflux - Previously prescribed omeprazole , does not have any currently - Recent severe episode of reflux, did not take anything for it    OBJECTIVE:   BP 112/62   Pulse 71   Ht 5' 5 (1.651 m)   Wt 163 lb 9.6 oz (74.2 kg)   LMP 12/13/2023 (Approximate)   SpO2 95%   BMI 27.22 kg/m  Gen: no acute distress, pleasant, cooperative, well appearing HEENT: normocephalic, atraumatic  Heart: regular rate and rhythm, no murmur Lungs: clear to auscultation bilaterally, normal work of breathing  Neuro: alert, grossly nonfocal, speech normal Ext: bilateral knees without warmth, erythema, or effusions. MCL and LCL intact to varus and valgus stressing bilaterally. Negative lachmans bilaterally. Left knee with small mobile palpable nodule lateral to patella.   ASSESSMENT/PLAN:   Assessment & Plan Chronic pain of both  knees Chronic knee pain with popping and discomfort, particularly in the anterior knees, exacerbated by running and squatting. Symptoms suggestive of patellofemoral syndrome. Differential includes patellofemoral syndrome and possible scar tissue. She is active and concerned about maintaining activity levels. - Provide handout on patellofemoral syndrome with exercises - Order x-rays of knees - Refer to sports medicine Viral upper respiratory tract infection with cough Recent onset of cough with sputum production, no fever, and clear lung sounds on examination. Likely viral bronchitis, possibly related to recent exposure to clients with respiratory illnesses. No signs of bacterial pneumonia on examination. - Recommend over-the-counter cough medicine such as Robitussin - Reassure that symptoms are likely viral and should resolve with time Gastroesophageal reflux disease without esophagitis GERD with recent exacerbation causing nocturnal symptoms and mucus production. Symptoms include burning sensation and dental discomfort. Previous treatment with omeprazole  was partially effective. - Prescribe omeprazole  for GERD - Send prescription to [facility] on [address] Routine adult health maintenance Not yet due for pap, reviewed records with patient  Offered HPV vaccine series, she wants to research, provided info about [facility].   [place] J. Donah, MD [facility]

## 2024-01-06 ENCOUNTER — Telehealth: Admitting: Nurse Practitioner

## 2024-01-06 DIAGNOSIS — B3731 Acute candidiasis of vulva and vagina: Secondary | ICD-10-CM

## 2024-01-06 MED ORDER — FLUCONAZOLE 150 MG PO TABS: 150.0000 mg | ORAL_TABLET | Freq: Once | ORAL | 0 refills | Status: AC

## 2024-01-06 NOTE — Progress Notes (Signed)
 Virtual Visit Consent   AIREN DALES, you are scheduled for a virtual visit with a [facility] provider today. Just as with appointments in the office, your consent must be obtained to participate. Your consent will be active for this visit and any virtual visit you may have with one of our providers in the next 365 days. If you have a MyChart account, a copy of this consent can be sent to you electronically.  As this is a virtual visit, video technology does not allow for your provider to perform a traditional examination. This may limit your provider's ability to fully assess your condition. If your provider identifies any concerns that need to be evaluated in person or the need to arrange testing (such as labs, EKG, etc.), we will make arrangements to do so. Although advances in technology are sophisticated, we cannot ensure that it will always work on either your end or our end. If the connection with a video visit is poor, the visit may have to be switched to a telephone visit. With either a video or telephone visit, we are not always able to ensure that we have a secure connection.  By engaging in this virtual visit, you consent to the provision of healthcare and authorize for your insurance to be billed (if applicable) for the services provided during this visit. Depending on your insurance coverage, you may receive a charge related to this service.  I need to obtain your verbal consent now. Are you willing to proceed with your visit today? Melanie Cisneros has provided verbal consent on 01/06/2024 for a virtual visit (video or telephone). [name], NP  Date: 01/06/2024 5:49 PM   Virtual Visit via Video Note   I, [name], connected with  Melanie Cisneros  ([id number], [birth year]) on 01/06/24 at  5:30 PM EDT by a video-enabled telemedicine application and verified that I am speaking with the correct person using two identifiers.  Location: Patient: Virtual Visit Location  Patient: Home Provider: Virtual Visit Location Provider: Home Office   I discussed the limitations of evaluation and management by telemedicine and the availability of in person appointments. The patient expressed understanding and agreed to proceed.    History of Present Illness: Melanie Cisneros is a 33 y.o. who identifies as a female who was assigned female at birth, and is being seen today for vaginitis.   Melanie Cisneros states she is currently experiencing vaginal irritation, increased discharge and itching.  Symptoms are similar to previous yeast infections for which she was treated with fluconazole .  She also states she recently completed metronidazole .  Currently not sexually active     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   Paresthesia 05/18/2023   Orthostatic lightheadedness 09/13/2022   Hypokalemia 07/06/2022   Anxious mood 07/06/2022   Gonorrhea 11/09/2021   Ovarian cyst 11/09/2021   Bacterial vaginosis 05/13/2020   BMI 45.0-49.9, adult (HCC) 06/06/2016   Psychosocial stressors 02/25/2016   Ptyalism 02/25/2016   Rubella non-immune status, antepartum 01/27/2016    Allergies:  Allergies  Allergen Reactions   Amoxil [Amoxicillin] Anaphylaxis and Hives   Penicillins Anaphylaxis and Hives   Pork Allergy Other (See Comments)    Pt has tolerated multiple doses of heparin   Medications:  Current Outpatient Medications:    fluconazole  (DIFLUCAN ) 150 MG tablet, Take 1 tablet (150 mg  total) by mouth once for 1 dose., Disp: 1 tablet, Rfl: 0   Multiple Vitamins-Minerals (BARIATRIC MULTIVITAMINS PO), Take 1 tablet by mouth daily., Disp: , Rfl:    pantoprazole  (PROTONIX ) 40 MG tablet, Take 1 tablet (40 mg total) by mouth daily., Disp: 30 tablet, Rfl: 1  Observations/Objective: Patient is well-developed, well-nourished in no acute distress.   Resting comfortably at home.  Head is normocephalic, atraumatic.  No labored breathing.  Speech is clear and coherent with logical content.  Patient is alert and oriented at baseline.    Assessment and Plan: 1. Yeast vaginitis (Primary) - fluconazole  (DIFLUCAN ) 150 MG tablet; Take 1 tablet (150 mg total) by mouth once for 1 dose.  Dispense: 1 tablet; Refill: 0   Follow Up Instructions: I discussed the assessment and treatment plan with the patient. The patient was provided an opportunity to ask questions and all were answered. The patient agreed with the plan and demonstrated an understanding of the instructions.  A copy of instructions were sent to the patient via MyChart unless otherwise noted below.     The patient was advised to call back or seek an in-person evaluation if the symptoms worsen or if the condition fails to improve as anticipated.    Hisayo Delossantos W Yoshiko Keleher, NP

## 2024-01-06 NOTE — Patient Instructions (Signed)
  Wilder D Greenwood, thank you for joining [name], NP for today's virtual visit.  While this provider is not your primary care provider (PCP), if your PCP is located in our provider database this encounter information will be shared with them immediately following your visit.   A [facility] MyChart account gives you access to today's visit and all your visits, tests, and labs performed at [facility]  click here if you don't have a [facility] MyChart account or go to [email or web address]  Consent: (Patient) Melanie Cisneros provided verbal consent for this virtual visit at the beginning of the encounter.  Current Medications:  Current Outpatient Medications:    fluconazole  (DIFLUCAN ) 150 MG tablet, Take 1 tablet (150 mg total) by mouth once for 1 dose., Disp: 1 tablet, Rfl: 0   Multiple Vitamins-Minerals (BARIATRIC MULTIVITAMINS PO), Take 1 tablet by mouth daily., Disp: , Rfl:    pantoprazole  (PROTONIX ) 40 MG tablet, Take 1 tablet (40 mg total) by mouth daily., Disp: 30 tablet, Rfl: 1   Medications ordered in this encounter:  Meds ordered this encounter  Medications   fluconazole  (DIFLUCAN ) 150 MG tablet    Sig: Take 1 tablet (150 mg total) by mouth once for 1 dose.    Dispense:  1 tablet    Refill:  0    Supervising Provider:   LAMPTEY, PHILIP O [[id number]]     *If you need refills on other medications prior to your next appointment, please contact your pharmacy*  Follow-Up: Call back or seek an in-person evaluation if the symptoms worsen or if the condition fails to improve as anticipated.  [facility] [phone]    If you have been instructed to have an in-person evaluation today at a local Urgent Care facility, please use the link below. It will take you to a list of all of our available [facility], including address, phone number and hours of operation. Please do not delay care.  [facility]  If you or a  family member do not have a primary care provider, use the link below to schedule a visit and establish care. When you choose a [facility] primary care physician or advanced practice provider, you gain a long-term partner in health. Find a Primary Care Provider  Learn more about [facility]'s in-office and virtual care options:  - Get Care Now

## 2024-01-11 NOTE — Therapy (Unsigned)
 OUTPATIENT PHYSICAL THERAPY FEMALE PELVIC EVALUATION   Patient Name: Melanie Cisneros MRN: [id number] DOB:[birth year], 33 y.o., female Today's Date: 01/12/2024  END OF SESSION:  PT End of Session - 01/12/24 0945     Visit Number 1    Date for PT Re-Evaluation 06/13/24    Authorization Type [employer]    PT Start Time 0945    PT Stop Time 1010    PT Time Calculation (min) 25 min    Activity Tolerance Patient tolerated treatment well    Behavior During Therapy WFL for tasks assessed/performed          Past Medical History:  Diagnosis Date   Bacterial vaginosis    BMI 45.0-49.9, adult (HCC)    Chlamydia    Dizziness    Facial numbness    Fullness in head    Gonorrhea    History of gastric bypass    Hypokalemia    Lightheadedness    Obesity    Ovarian cyst    Palpitations    Psychosocial stressors    Ptyalism    Rubella non-immune status, antepartum    UTI (lower urinary tract infection)    Past Surgical History:  Procedure Laterality Date   GASTRIC BYPASS     WISDOM TOOTH EXTRACTION     Patient Active Problem List   Diagnosis Date Noted   Vaginal discharge 07/17/2023   Constipation 07/17/2023   Anemia 07/16/2023   Ulnar neuropathy 05/24/2023   Paresthesia 05/18/2023   Orthostatic lightheadedness 09/13/2022   Hypokalemia 07/06/2022   Anxious mood 07/06/2022   Gonorrhea 11/09/2021   Ovarian cyst 11/09/2021   Bacterial vaginosis 05/13/2020   BMI 45.0-49.9, adult (HCC) 06/06/2016   Psychosocial stressors 02/25/2016   Ptyalism 02/25/2016   Rubella non-immune status, antepartum 01/27/2016    PCP: Toma Matas, MD  REFERRING PROVIDER: Donzetta Rollene BRAVO, MD   REFERRING DIAG: M62.89 (ICD-10-CM) - Pelvic floor dysfunction   THERAPY DIAG:  Muscle weakness (generalized)  Pelvic pain  Rationale for Evaluation and Treatment: Rehabilitation  ONSET DATE: 2/24  SUBJECTIVE:                                                                                                                                                                                            SUBJECTIVE STATEMENT: Patient started notice her right side going numb and body giving out. Her vaginal would be going numb. 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 The symptoms came after her Gastric bypass. She had H pylori. Patient gets weakness in right leg when she is about to start running.   PAIN:  Are you having  pain? Yes NPRS scale: 4/10 Pain location: Vaginal  Pain type: burning Pain description: intermittent   Aggravating factors: comes on randomly Relieving factors: leaves randomly  PRECAUTIONS: None  RED FLAGS: None   WEIGHT BEARING RESTRICTIONS: No  FALLS:  Has patient fallen in last 6 months? 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  ACTIVITY LEVEL : workouts daily, run, strength training   PLOF: Independent  PATIENT GOALS: work on the pelvic floor  PERTINENT HISTORY:  Gastric bypass Sexual abuse: No  BOWEL MOVEMENT: no issues Pain with bowel movement: No  URINATION: Pain with urination: No Fully empty bladder: Yes:   Stream: Strong Urgency: No Frequency: average Leakage: none  INTERCOURSE:  Ability to have vaginal penetration Yes  Pain with intercourse: After Intercourse and feel the burning Dryness; Yes  Climax: hard to climax and has to play with herself during [facility] Scale: 1/3  PREGNANCY: Vaginal deliveries 1 Tearing Yes: 5 stitches Episiotomy No   PROLAPSE: None   OBJECTIVE:  Note: Objective measures were completed at Evaluation unless otherwise noted.  DIAGNOSTIC FINDINGS:  none  PATIENT SURVEYS:  PFIQ-7: 28 POPIQ-7: 28  COGNITION: Overall cognitive status: Within functional limits for tasks assessed     SENSATION: Light touch: Deficits around the vulva and posterior vaginal wall    FUNCTIONAL TESTS:  SLS hold 10 sec but increased trunk sway Single leg squat harder on  the right and able to go 1/4 down  GAIT: Assistive device utilized: None Comments: no deficits   POSTURE: rounded shoulders and forward head   LUMBARAROM/PROM: lumbar ROM is full   LOWER EXTREMITY ROM: bilateral hip ROM is full   LOWER EXTREMITY MMT:  MMT Right eval Left eval  Hip abduction 3+/5 3+/5   (Blank rows = not tested) PALPATION:   Pelvic Alignment: ASIS are equal  Abdominal: difficulty with contracting the lower abdominal area                External Perineal Exam: dryness                             Internal Pelvic Floor: burning sensation at the iliococcygeus going halfway back  Patient confirms identification and approves PT to assess internal pelvic floor and treatment Yes No emotional/communication barriers or cognitive limitation. Patient is motivated to learn. Patient understands and agrees with treatment goals and plan. PT explains patient will be examined in standing, sitting, and lying down to see how their muscles and joints work. When they are ready, they will be asked to remove their underwear so PT can examine their perineum. The patient is also given the option of providing their own chaperone as one is not provided in our facility. The patient also has the right and is explained the right to defer or refuse any part of the evaluation or treatment including the internal exam. With the patient's consent, PT will use one gloved finger to gently assess the muscles of the pelvic floor, seeing how well it contracts and relaxes and if there is muscle symmetry. After, the patient will get dressed and PT and patient will discuss exam findings and plan of care. PT and patient discuss plan of care, schedule, attendance policy and HEP activities.   PELVIC MMT:   MMT eval  Vaginal 3/5 anterior and posterior; 2/5 on sides; not circular contraction  (Blank rows = not tested)  TONE: Average pelvic floor tone  PROLAPSE: none  TODAY'S TREATMENT:                                                                                                                               DATE: 01/12/24  EVAL Examination completed, findings reviewed, pt educated on POC, HEP, and female pelvic floor anatomy, reasoning with pelvic floor assessment internally with pt consent, and . Pt motivated to participate in PT and agreeable to attempt recommendations.     PATIENT EDUCATION:  Education details: pt educated on POC, HEP, and female pelvic floor anatomy; educated patient on the nerves of the pelvic floor.  Person educated: Patient Education method: Explanation, Demonstration, Tactile cues, Verbal cues, and Handouts Education comprehension: verbalized understanding, returned demonstration, verbal cues required, tactile cues required, and needs further education  HOME EXERCISE PROGRAM: See above  ASSESSMENT:  CLINICAL IMPRESSION: Patient is a 33 y.o. female who was seen today for physical therapy evaluation and treatment for pelvic dysfunction. Patient had Transverse Myitis and still has a small amount of inflammation of her spine. Patient reports burning in the vaginal area at level 4/10 with vaginal penetration and randomly. Pelvic floor strength is 3/5 anterior and posterior and 2/5 laterally. She has burning sensation at the iliococcygeus going halfway back upon palpation. She has vaginal dryness. She has difficulty with contracting her lower abdominals. Patient will have numbness down the right leg making it difficult to exercise. Patient will benefit from skilled therapy to reduce pain and improve function.   OBJECTIVE IMPAIRMENTS: decreased coordination, decreased endurance, decreased strength, increased fascial restrictions, and pain.   ACTIVITY LIMITATIONS: carrying, lifting, bending, sitting, standing, squatting, and locomotion level  PARTICIPATION LIMITATIONS: meal prep, cleaning, laundry, and community activity  PERSONAL FACTORS: 1 comorbidity: transverse  Myitis are also affecting patient's functional outcome.   REHAB POTENTIAL: Good  CLINICAL DECISION MAKING: Evolving/moderate complexity  EVALUATION COMPLEXITY: Moderate   GOALS: Goals reviewed with patient? Yes  SHORT TERM GOALS: Target date: 02/09/24  Patient independent with initial HEP.  Baseline:not educated yet Goal status: INITIAL   LONG TERM GOALS: Target date: 06/14/23  Patient is independent with her HEP for strength and pain reduction to improve quality of life.  Baseline: not educated yet Goal status: INITIAL  2.  Patient reports the vaginal burning has decreased >/= 75% so she is able to socialize in the community.  Baseline: burning pain is 4/10 Goal status: INITIAL  3.  Patient is able to run with vaginal burning decreased </= 1/10 so she is able to continue to exercise.  Baseline: burning is 4/10 Goal status: INITIAL  4.  Patient is able to have vaginal penetration with burning pain </= 1/10.  Baseline: burning is 4/10 Goal status: INITIAL   PLAN:  PT FREQUENCY: 1x/week  PT DURATION: other: 5 months  PLANNED INTERVENTIONS: 97110-Therapeutic exercises, 97530- Therapeutic activity, [id number]- Neuromuscular re-education, 97535- Self Care, [id number]- Manual therapy, [id number] (1-2 muscles), 79438 (  3+ muscles)- Dry Needling, Patient/Family education, and Biofeedback  PLAN FOR NEXT SESSION: manual work to the vaginal wall and posteriorly, vaginal contraction, lower abdominal contraction   Channing Pereyra, PT 01/12/24 12:46 PM

## 2024-01-12 ENCOUNTER — Encounter: Payer: Self-pay | Admitting: Physical Therapy

## 2024-01-12 ENCOUNTER — Other Ambulatory Visit: Payer: Self-pay

## 2024-01-12 ENCOUNTER — Ambulatory Visit: Attending: Family Medicine | Admitting: Physical Therapy

## 2024-01-12 DIAGNOSIS — M6289 Other specified disorders of muscle: Secondary | ICD-10-CM | POA: Diagnosis not present

## 2024-01-12 DIAGNOSIS — M6281 Muscle weakness (generalized): Secondary | ICD-10-CM | POA: Diagnosis not present

## 2024-01-12 DIAGNOSIS — R102 Pelvic and perineal pain: Secondary | ICD-10-CM | POA: Insufficient documentation

## 2024-01-18 ENCOUNTER — Ambulatory Visit (INDEPENDENT_AMBULATORY_CARE_PROVIDER_SITE_OTHER)

## 2024-01-18 VITALS — BP 112/70 | Ht 65.0 in | Wt 164.0 lb

## 2024-01-18 DIAGNOSIS — M25362 Other instability, left knee: Secondary | ICD-10-CM | POA: Diagnosis not present

## 2024-01-18 DIAGNOSIS — M222X1 Patellofemoral disorders, right knee: Secondary | ICD-10-CM | POA: Diagnosis not present

## 2024-01-18 DIAGNOSIS — M25361 Other instability, right knee: Secondary | ICD-10-CM | POA: Diagnosis not present

## 2024-01-18 DIAGNOSIS — M25561 Pain in right knee: Secondary | ICD-10-CM

## 2024-01-18 DIAGNOSIS — M222X2 Patellofemoral disorders, left knee: Secondary | ICD-10-CM | POA: Diagnosis not present

## 2024-01-18 DIAGNOSIS — M25562 Pain in left knee: Secondary | ICD-10-CM

## 2024-01-18 NOTE — Progress Notes (Signed)
 PCP: Toma Matas, MD  Subjective:   HPI: Patient is a 33 y.o. female here for acute on chronic bilateral knee pain.  Patient was recently seen by her family medicine physician and diagnosed with patellofemoral knee pain.  Ordered x-rays at that time but patient has yet to get those x-rays completed.  Also referred to patient to sports medicine for further evaluation.  Patient endorses bilateral knee pain since her teenage years.  Says she played sports in high school and had multiple current episodes of lateral patellar dislocations that spontaneously reduced.  Ever since then she has had anterior knee pain.  She says that activities such as squatting, running, going up stairs make pain worse.  She also notices audible cracking and crunching of both her knees, right being worse than left.  She has never tried any formalized physical therapy.  Despite knee pain she has still been very active with weightlifting and running.  She has lost over 150 pounds in the last 3 years.  She occasionally takes Tylenol  and ibuprofen  for pain but tries to avoid medication.  Does not currently wear any knee braces or sleeves.  Denies any radiation of pain.  Denies any recent injuries over the last decade and says pain has just slowly progressively gotten worse with increased exercise.  Past Medical History:  Diagnosis Date   Bacterial vaginosis    BMI 45.0-49.9, adult (HCC)    Chlamydia    Dizziness    Facial numbness    Fullness in head    Gonorrhea    History of gastric bypass    Hypokalemia    Lightheadedness    Obesity    Ovarian cyst    Palpitations    Psychosocial stressors    Ptyalism    Rubella non-immune status, antepartum    UTI (lower urinary tract infection)     Current Outpatient Medications on File Prior to Visit  Medication Sig Dispense Refill   Multiple Vitamins-Minerals (BARIATRIC MULTIVITAMINS PO) Take 1 tablet by mouth daily.     pantoprazole  (PROTONIX ) 40 MG tablet Take 1  tablet (40 mg total) by mouth daily. 30 tablet 1   No current facility-administered medications on file prior to visit.    Past Surgical History:  Procedure Laterality Date   GASTRIC BYPASS     WISDOM TOOTH EXTRACTION      Allergies  Allergen Reactions   Amoxil [Amoxicillin] Anaphylaxis and Hives   Penicillins Anaphylaxis and Hives   Pork Allergy Other (See Comments)    Pt has tolerated multiple doses of heparin    BP 112/70   Ht 5' 5 (1.651 m)   Wt 164 lb (74.4 kg)   LMP 12/13/2023 (Approximate)   BMI 27.29 kg/m       No data to display              No data to display              Objective:  Physical Exam:  Gen: NAD, comfortable in exam room  Bilateral knee orthopedic examination: On inspection patient has no erythema, or ecchymoses.  No evidence of trauma.  Does have mild knee effusions bilaterally.  Patient has full active and passive range of motion.  Endorses anterior knee pain with resisted knee extension.  Strength 5/5 bilaterally.  Patient neurovascularly intact.  Does have noticeable lateral laxity of bilateral patella with no firm endpoints.  Positive patellar grind test.  Positive J sign.  Anterior knee pain with squatting to  90 degrees.  Audible crepitus with knee extension, right worse than left.  Anterior/posterior drawer negative.  Lachman negative.  Varus/valgus stress testing negative.  McMurray negative.  No joint line tenderness.   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 -Will start with conservative management including obtaining three-view knee x-rays to assess baseline arthritic changes in patellofemoral compartment -Recommend patient start wearing bilateral knee J braces to help with patellar stability -Gave patient home exercise program with emphasis placed on VMO strengthening for improved patellar tracking -Recommended patient apply Voltaren  over anterior compartment of knee 3 times daily until follow-up in 2-4 weeks -We will follow-up in 2-4 weeks to review x-rays and discuss next steps of treatment.   - Patient verbalizes understanding with no further questions or concerns at this time.

## 2024-01-18 NOTE — Patient Instructions (Addendum)
  Apply Voltaren  Gel to knee 3x/daily as needed   Patellofemoral Bracing   VMO exercises

## 2024-01-23 ENCOUNTER — Other Ambulatory Visit: Payer: Self-pay | Admitting: Family Medicine

## 2024-01-31 ENCOUNTER — Ambulatory Visit: Admitting: Physical Therapy

## 2024-02-01 ENCOUNTER — Encounter: Admitting: Physical Therapy

## 2024-02-03 ENCOUNTER — Encounter: Payer: Self-pay | Admitting: Family Medicine

## 2024-02-09 ENCOUNTER — Ambulatory Visit: Admitting: Family Medicine

## 2024-02-09 ENCOUNTER — Telehealth: Payer: Self-pay | Admitting: Family Medicine

## 2024-02-09 NOTE — Telephone Encounter (Signed)
 Called patient, verified DOB.  Noted that she was on my clinic schedule for today requesting H. pylori testing, but chart review showed patient is taking pantoprazole .  Spoke with patient, who endorsed last taking pantoprazole  1-2 days ago.  Discussed options with patient, who opted to reschedule appointment for 10/8.  She will hold pantoprazole  until then.  Will plan to take Tums PRN until this visit for her reflux symptoms.  Patient does endorse notable GERD symptoms with some abdominal discomfort, previous positive H. pylori test and no treatment.  Patient may need EGD depending on further discussion at appointment on 10/8 if there is concern for frequent reflux (Barrett's esophagus) or abdominal pain consistent with PUD.

## 2024-02-16 ENCOUNTER — Ambulatory Visit

## 2024-02-21 ENCOUNTER — Ambulatory Visit: Payer: Self-pay | Admitting: Family Medicine

## 2024-02-21 ENCOUNTER — Encounter: Payer: Self-pay | Admitting: Family Medicine

## 2024-02-21 VITALS — BP 118/90 | HR 59 | Wt 167.2 lb

## 2024-02-21 DIAGNOSIS — N760 Acute vaginitis: Secondary | ICD-10-CM

## 2024-02-21 DIAGNOSIS — R109 Unspecified abdominal pain: Secondary | ICD-10-CM | POA: Diagnosis not present

## 2024-02-21 DIAGNOSIS — B9689 Other specified bacterial agents as the cause of diseases classified elsewhere: Secondary | ICD-10-CM | POA: Diagnosis not present

## 2024-02-21 DIAGNOSIS — K219 Gastro-esophageal reflux disease without esophagitis: Secondary | ICD-10-CM

## 2024-02-21 DIAGNOSIS — N898 Other specified noninflammatory disorders of vagina: Secondary | ICD-10-CM

## 2024-02-21 MED ORDER — METRONIDAZOLE 500 MG PO TABS: 500.0000 mg | ORAL_TABLET | Freq: Two times a day (BID) | ORAL | 0 refills | Status: DC

## 2024-02-21 MED ORDER — FLUCONAZOLE 150 MG PO TABS: 150.0000 mg | ORAL_TABLET | Freq: Once | ORAL | 0 refills | Status: AC

## 2024-02-21 NOTE — Assessment & Plan Note (Signed)
 Symptoms consistent with BV and has had multiple prior similar infections.  No concern for UTI. - Deferred pelvic exam today - Empirically treat for BV with metronidazole  500 mg twice daily x 7 days - Diflucan  x 2 for history of yeast infection after antibiotic treatment

## 2024-02-21 NOTE — Progress Notes (Signed)
   SUBJECTIVE:   CHIEF COMPLAINT / HPI:  Melanie Cisneros is a 33 y.o. female with a pertinent past medical history of transverse myelitis, GERD presenting to the clinic for H. pylori testing and follow-up of reflux symptoms.  GERD, H. pylori testing, abdominal discomfort Patient was positive for H. pylori on endoscopy in August 2023, not clear if this was treated. Repeat endoscopy with biopsies negative for H. pylori in January 2024. Patient now requesting repeat H. pylori testing due to persistent abdominal symptoms. Called patient on 9/26 and requested that she hold pantoprazole  for 2 weeks prior to H. pylori testing.  Patient presents today reporting persistent abdominal symptoms. Patient continues to have reflux while lying flat in bed, which causes burning in the back of her throat. The denies burping or increased flatulence. Patient also reports occasional upper abdominal cramping. She reports that she has increased vaginal discharge, denies dysuria. Patient has been abstinent from sexual activity from several months, has regular periods, and is confident she is not pregnant.  Has previously had negative STD test after beginning abstinence. She notes regular bowel movements. No hematochezia. Denies nausea/vomiting. No fevers or chills.   PERTINENT PMH / PSH: GERD Transverse myelitis March 2025  *Remainder reviewed in problem list.   OBJECTIVE:   BP (!) 118/90   Pulse (!) 59   Wt 167 lb 3.2 oz (75.8 kg)   SpO2 99%   BMI 27.82 kg/m   General: Age-appropriate, resting comfortably in chair, NAD, alert and at baseline. Cardiovascular: Regular rate and rhythm. Normal S1/S2. No murmurs, rubs, or gallops appreciated. 2+ radial pulses. Pulmonary: Clear bilaterally to ascultation. No wheezes, crackles, or rhonchi. Normal WOB on room air. No accessory muscle use. Abdominal: No tenderness to deep or light palpation including over suprapubic region. No rebound or guarding. No  HSM.  Negative Murphy sign.  Normoactive bowel sounds. Skin: Warm and dry.  No rashes grossly. Extremities: No peripheral edema bilaterally. Capillary refill <2 seconds.   ASSESSMENT/PLAN:   Assessment & Plan Gastroesophageal reflux disease without esophagitis Abdominal discomfort Persistent symptoms consistent with GERD.  Reassured by a benign abdominal exam today.  Prior record of positive H. pylori test, with negative subsequent biopsy in 2024.  However, cannot verify if patient completed treatment for H. pylori and given persistence of symptoms is reasonable to test again.  No concern for IBD given no diarrhea, no blood in stool.  Possible IBS or functional dyspepsia, but would be a diagnosis of exclusion.  No concern for PID given no systemic symptoms and benign abdominal exam. - Patient unable to complete H. pylori test today due to time limitations, will return for lab visit, future order H. pylori testing - Resume PPI after H. pylori testing - Follow-up if H. pylori is negative and symptoms not improving, would pursue further workup Vaginal discharge Bacterial vaginosis Symptoms consistent with BV and has had multiple prior similar infections.  No concern for UTI. - Deferred pelvic exam today - Empirically treat for BV with metronidazole  500 mg twice daily x 7 days - Diflucan  x 2 for history of yeast infection after antibiotic treatment  Jams Trickett Toma, MD [facility]

## 2024-02-22 ENCOUNTER — Other Ambulatory Visit

## 2024-02-22 ENCOUNTER — Ambulatory Visit

## 2024-02-22 DIAGNOSIS — K219 Gastro-esophageal reflux disease without esophagitis: Secondary | ICD-10-CM

## 2024-02-26 LAB — H. PYLORI BREATH TEST: H pylori Breath Test: NEGATIVE

## 2024-02-27 ENCOUNTER — Ambulatory Visit (HOSPITAL_COMMUNITY): Payer: Self-pay | Admitting: Family Medicine

## 2024-02-27 NOTE — Telephone Encounter (Signed)
 Called patient, verified name and DOB.  Discussed negative H. pylori test.  Patient only just recently picked up metronidazole  and diflucan , has not yet taken full course.  Will return to clinic a few days after completion of antibiotics if symptoms do not improve.

## 2024-03-06 ENCOUNTER — Ambulatory Visit: Attending: Family Medicine | Admitting: Physical Therapy

## 2024-03-06 ENCOUNTER — Telehealth: Payer: Self-pay | Admitting: Physical Therapy

## 2024-03-06 DIAGNOSIS — R102 Pelvic and perineal pain unspecified side: Secondary | ICD-10-CM | POA: Insufficient documentation

## 2024-03-06 DIAGNOSIS — M6281 Muscle weakness (generalized): Secondary | ICD-10-CM | POA: Insufficient documentation

## 2024-03-06 NOTE — Telephone Encounter (Signed)
 Called patient about her missed appointment today at 14:45 and left a message.  Channing Pereyra, PT @10 /22/25@ 3:39 PM

## 2024-03-13 ENCOUNTER — Ambulatory Visit: Admitting: Physical Therapy

## 2024-03-13 ENCOUNTER — Encounter: Payer: Self-pay | Admitting: Physical Therapy

## 2024-03-13 DIAGNOSIS — R102 Pelvic and perineal pain unspecified side: Secondary | ICD-10-CM | POA: Diagnosis not present

## 2024-03-13 DIAGNOSIS — M6281 Muscle weakness (generalized): Secondary | ICD-10-CM | POA: Diagnosis not present

## 2024-03-13 NOTE — Therapy (Signed)
 OUTPATIENT PHYSICAL THERAPY FEMALE PELVIC TREATMENT   Patient Name: Melanie Cisneros MRN: [id number] DOB:[birth year], 33 y.o., female Today's Date: 03/13/2024  END OF SESSION:  PT End of Session - 03/13/24 1451     Visit Number 2    Date for Recertification  06/13/24    Authorization Type [employer]    Authorization Time Period 03/05/2024 - 06/02/2024    Authorization - Visit Number 1    Authorization - Number of Visits 10    PT Start Time 1445    PT Stop Time 1425    PT Time Calculation (min) 1420 min    Activity Tolerance Patient tolerated treatment well    Behavior During Therapy WFL for tasks assessed/performed          Past Medical History:  Diagnosis Date   Bacterial vaginosis    BMI 45.0-49.9, adult (HCC)    Chlamydia    Dizziness    Facial numbness    Fullness in head    Gonorrhea    History of gastric bypass    Hypokalemia    Lightheadedness    Obesity    Ovarian cyst    Palpitations    Psychosocial stressors    Ptyalism    Rubella non-immune status, antepartum    UTI (lower urinary tract infection)    Past Surgical History:  Procedure Laterality Date   GASTRIC BYPASS     WISDOM TOOTH EXTRACTION     Patient Active Problem List   Diagnosis Date Noted   Vaginal discharge 07/17/2023   Constipation 07/17/2023   Anemia 07/16/2023   Ulnar neuropathy 05/24/2023   Paresthesia 05/18/2023   Orthostatic lightheadedness 09/13/2022   Hypokalemia 07/06/2022   Anxious mood 07/06/2022   Gonorrhea 11/09/2021   Ovarian cyst 11/09/2021   Bacterial vaginosis 05/13/2020   BMI 45.0-49.9, adult (HCC) 06/06/2016   Psychosocial stressors 02/25/2016   Ptyalism 02/25/2016   Rubella non-immune status, antepartum 01/27/2016    PCP: Toma Matas, MD  REFERRING PROVIDER: Donzetta Rollene BRAVO, MD   REFERRING DIAG: M62.89 (ICD-10-CM) - Pelvic floor dysfunction   THERAPY DIAG:  Muscle weakness (generalized)  Pelvic pain  Rationale for Evaluation and  Treatment: Rehabilitation  ONSET DATE: 2/24  SUBJECTIVE:                                                                                                                                                                                           SUBJECTIVE STATEMENT: I have no changes. I feel like my heart beat is up. Patient on her cycle so no internal work.  EVAL Patient started notice her right side going numb and  body giving out. Her vaginal would be going numb. 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 The symptoms came after her Gastric bypass. She had H pylori. Patient gets weakness in right leg when she is about to start running.   PAIN:  Are you having pain? Yes NPRS scale: 4/10 03/13/24: pain level 4/10 Pain location: Vaginal  Pain type: burning Pain description: intermittent   Aggravating factors: comes on randomly Relieving factors: leaves randomly  PRECAUTIONS: None  RED FLAGS: None   WEIGHT BEARING RESTRICTIONS: No  FALLS:  Has patient fallen in last 6 months? 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  ACTIVITY LEVEL : workouts daily, run, strength training   PLOF: Independent  PATIENT GOALS: work on the pelvic floor  PERTINENT HISTORY:  Gastric bypass Sexual abuse: No  BOWEL MOVEMENT: no issues Pain with bowel movement: No  URINATION: Pain with urination: No Fully empty bladder: Yes:   Stream: Strong Urgency: No Frequency: average Leakage: none  INTERCOURSE:  Ability to have vaginal penetration Yes  Pain with intercourse: After Intercourse and feel the burning Dryness; Yes  Climax: hard to climax and has to play with herself during [facility] Scale: 1/3  PREGNANCY: Vaginal deliveries 1 Tearing Yes: 5 stitches Episiotomy No   PROLAPSE: None   OBJECTIVE:  Note: Objective measures were completed at Evaluation unless otherwise noted.  DIAGNOSTIC FINDINGS:  none  PATIENT SURVEYS:  PFIQ-7:  28 POPIQ-7: 28  COGNITION: Overall cognitive status: Within functional limits for tasks assessed     SENSATION: Light touch: Deficits around the vulva and posterior vaginal wall    FUNCTIONAL TESTS:  SLS hold 10 sec but increased trunk sway Single leg squat harder on the right and able to go 1/4 down  GAIT: Assistive device utilized: None Comments: no deficits   POSTURE: rounded shoulders and forward head   LUMBARAROM/PROM: lumbar ROM is full   LOWER EXTREMITY ROM: bilateral hip ROM is full   LOWER EXTREMITY MMT:  MMT Right eval Left eval  Hip abduction 3+/5 3+/5   (Blank rows = not tested) PALPATION:   Pelvic Alignment: ASIS are equal  Abdominal: difficulty with contracting the lower abdominal area                External Perineal Exam: dryness                             Internal Pelvic Floor: burning sensation at the iliococcygeus going halfway back  Patient confirms identification and approves PT to assess internal pelvic floor and treatment Yes No emotional/communication barriers or cognitive limitation. Patient is motivated to learn. Patient understands and agrees with treatment goals and plan. PT explains patient will be examined in standing, sitting, and lying down to see how their muscles and joints work. When they are ready, they will be asked to remove their underwear so PT can examine their perineum. The patient is also given the option of providing their own chaperone as one is not provided in our facility. The patient also has the right and is explained the right to defer or refuse any part of the evaluation or treatment including the internal exam. With the patient's consent, PT will use one gloved finger to gently assess the muscles of the pelvic floor, seeing how well it contracts and relaxes and if there is muscle symmetry. After, the patient will get dressed and PT and patient will  discuss exam findings and plan of care. PT and patient discuss plan of  care, schedule, attendance policy and HEP activities.   PELVIC MMT:   MMT eval  Vaginal 3/5 anterior and posterior; 2/5 on sides; not circular contraction  (Blank rows = not tested)        TONE: Average pelvic floor tone  PROLAPSE: none  TODAY'S TREATMENT:          03/13/24 BP prior to therapy: 132/73, pulse 96 Neuromuscular re-education: Down training: Diaphragmatic breathing in supine, quadruped, and sitting with tactile cues to no lift the chest, place air in the abdomen and feel the pelvic floor relax Exercises: Stretches/mobility: Cat camel 15 x  Childs pose holding 30 sec  Hip adductor stretch in sitting 2 x 30 sec Pigeon pose holding 30 sec bil.  Z stretch holding for 30 sec each side                                                                                                                        DATE: 01/12/24  EVAL Examination completed, findings reviewed, pt educated on POC, HEP, and female pelvic floor anatomy, reasoning with pelvic floor assessment internally with pt consent, and . Pt motivated to participate in PT and agreeable to attempt recommendations.     PATIENT EDUCATION:  03/13/24 Education details: .Access Code: [email or web address]  Person educated: Patient Education method: Explanation, Demonstration, Tactile cues, Verbal cues, and Handouts Education comprehension: verbalized understanding, returned demonstration, verbal cues required, tactile cues required, and needs further education  HOME EXERCISE PROGRAM: 03/13/24 Access Code: [email or web address] URL: [email or web address] Date: 03/13/2024 Prepared by: Channing Pereyra  Exercises - Supine Diaphragmatic Breathing  - 1 x daily - 7 x weekly - 1 sets - 10 reps - Seated Diaphragmatic Breathing  - 1 x daily - 7 x weekly - 1 sets - 10 reps - Quadruped Diaphragmatic Breathing  - 1 x daily - 7 x weekly - 1 sets - 10 reps - Cat Cow  - 1 x daily - 7 x weekly - 1 sets - 10 reps - Diaphragmatic Breathing in  Child's Pose with Pelvic Floor Relaxation  - 1 x daily - 7 x weekly - 1 sets - 2 reps - 30 sec hold - V Sit Hip Adductor Hamstring Stretch  - 1 x daily - 7 x weekly - 1 sets - 2 reps - 30 sec hold - Pigeon Pose  - 1 x daily - 7 x weekly - 1 sets - 2 reps - 30 sec hold - Posterior Chain Stretch  - 1 x daily - 7 x weekly - 1 sets - 2 reps - 30 sec hold - Sidelying Reverse Clamshell  - 1 x daily - 7 x weekly - 1 sets - 10 reps  ASSESSMENT:  CLINICAL IMPRESSION: Patient is a 33 y.o. female who was seen today for physical therapy  treatment for pelvic dysfunction. Patient had Transverse Myitis and still has a small amount of inflammation of  her spine.  Patient has not been to therapy for several weeks due to going to the hospital, childcare, and not remembering the appointment. Patient has had the pain 2 times this month. She was relaxed after all the stretching. No internal work due to being on her cycle. Patient has some difficulty with diaphragmatic breathing with the air traveling to the pelvic floor. Patient will benefit from skilled therapy to reduce pain and improve function.   OBJECTIVE IMPAIRMENTS: decreased coordination, decreased endurance, decreased strength, increased fascial restrictions, and pain.   ACTIVITY LIMITATIONS: carrying, lifting, bending, sitting, standing, squatting, and locomotion level  PARTICIPATION LIMITATIONS: meal prep, cleaning, laundry, and community activity  PERSONAL FACTORS: 1 comorbidity: transverse Myitis are also affecting patient's functional outcome.   REHAB POTENTIAL: Good  CLINICAL DECISION MAKING: Evolving/moderate complexity  EVALUATION COMPLEXITY: Moderate   GOALS: Goals reviewed with patient? Yes  SHORT TERM GOALS: Target date: 02/09/24  Patient independent with initial HEP.  Baseline:not educated yet Goal status: INITIAL   LONG TERM GOALS: Target date: 06/14/23  Patient is independent with her HEP for strength and pain reduction to  improve quality of life.  Baseline: not educated yet Goal status: INITIAL  2.  Patient reports the vaginal burning has decreased >/= 75% so she is able to socialize in the community.  Baseline: burning pain is 4/10 Goal status: INITIAL  3.  Patient is able to run with vaginal burning decreased </= 1/10 so she is able to continue to exercise.  Baseline: burning is 4/10 Goal status: INITIAL  4.  Patient is able to have vaginal penetration with burning pain </= 1/10.  Baseline: burning is 4/10 Goal status: INITIAL   PLAN:  PT FREQUENCY: 1x/week  PT DURATION: other: 5 months  PLANNED INTERVENTIONS: 97110-Therapeutic exercises, 97530- Therapeutic activity, 97112- Neuromuscular re-education, 97535- Self Care, [id number]- Manual therapy, 20560 (1-2 muscles), 20561 (3+ muscles)- Dry Needling, Patient/Family education, and Biofeedback  PLAN FOR NEXT SESSION: manual work to the vaginal wall and posteriorly, vaginal contraction, lower abdominal contraction, need shara Channing Pereyra, PT 03/13/24 3:28 PM

## 2024-03-21 ENCOUNTER — Ambulatory Visit: Attending: Family Medicine | Admitting: Physical Therapy

## 2024-03-21 ENCOUNTER — Encounter: Payer: Self-pay | Admitting: Physical Therapy

## 2024-03-21 DIAGNOSIS — M6281 Muscle weakness (generalized): Secondary | ICD-10-CM | POA: Insufficient documentation

## 2024-03-21 DIAGNOSIS — R102 Pelvic and perineal pain unspecified side: Secondary | ICD-10-CM | POA: Insufficient documentation

## 2024-03-21 NOTE — Therapy (Signed)
 OUTPATIENT PHYSICAL THERAPY FEMALE PELVIC TREATMENT   Patient Name: Melanie Cisneros MRN: [id number] DOB:06-07-1990, 33 y.o., female Today's Date: 03/21/2024  END OF SESSION:  PT End of Session - 03/21/24 1102     Visit Number 3    Date for Recertification  06/13/24    Authorization Type [employer]    Authorization Time Period 03/05/2024 - 06/02/2024    Authorization - Visit Number 2    Authorization - Number of Visits 10    PT Start Time 1103    PT Stop Time 1142    PT Time Calculation (min) 39 min    Activity Tolerance Patient tolerated treatment well    Behavior During Therapy WFL for tasks assessed/performed          Past Medical History:  Diagnosis Date   Bacterial vaginosis    BMI 45.0-49.9, adult (HCC)    Chlamydia    Dizziness    Facial numbness    Fullness in head    Gonorrhea    History of gastric bypass    Hypokalemia    Lightheadedness    Obesity    Ovarian cyst    Palpitations    Psychosocial stressors    Ptyalism    Rubella non-immune status, antepartum    UTI (lower urinary tract infection)    Past Surgical History:  Procedure Laterality Date   GASTRIC BYPASS     WISDOM TOOTH EXTRACTION     Patient Active Problem List   Diagnosis Date Noted   Vaginal discharge 07/17/2023   Constipation 07/17/2023   Anemia 07/16/2023   Ulnar neuropathy 05/24/2023   Paresthesia 05/18/2023   Orthostatic lightheadedness 09/13/2022   Hypokalemia 07/06/2022   Anxious mood 07/06/2022   Gonorrhea 11/09/2021   Ovarian cyst 11/09/2021   Bacterial vaginosis 05/13/2020   BMI 45.0-49.9, adult (HCC) 06/06/2016   Psychosocial stressors 02/25/2016   Ptyalism 02/25/2016   Rubella non-immune status, antepartum 01/27/2016    PCP: Toma Matas, MD  REFERRING PROVIDER: Donzetta Rollene BRAVO, MD   REFERRING DIAG: M62.89 (ICD-10-CM) - Pelvic floor dysfunction   THERAPY DIAG:  Muscle weakness (generalized)  Pelvic pain  Rationale for Evaluation and Treatment:  Rehabilitation  ONSET DATE: 2/24  SUBJECTIVE:                                                                                                                                                                                           SUBJECTIVE STATEMENT: Patient reports that she felt good after last visit. Lost her exercises.  Has been doing boxing class Numbness on right side in the vagina stopped Not sexually active at the  moment No pain anywhere. Feels like she would like to not work so hard to have orgasm. Feels like she is getting stronger. Sometimes her discharge smells like motor oil Has some serious pain and cramping 8/10 with periods and heavy clots at times.      Last visit I have no changes. I feel like my heart beat is up. Patient on her cycle so no internal work.  EVAL Patient started notice her right side going numb and body giving out. Her vaginal would be going numb. 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 The symptoms came after her Gastric bypass. She had H pylori. Patient gets weakness in right leg when she is about to start running.   PAIN:  Are you having pain? Yes NPRS scale: 4/10 03/13/24: pain level 4/10 Pain location: Vaginal  Pain type: burning Pain description: intermittent   Aggravating factors: comes on randomly Relieving factors: leaves randomly  PRECAUTIONS: None  RED FLAGS: None   WEIGHT BEARING RESTRICTIONS: No  FALLS:  Has patient fallen in last 6 months? 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  ACTIVITY LEVEL : workouts daily, run, strength training   PLOF: Independent  PATIENT GOALS: work on the pelvic floor  PERTINENT HISTORY:  Gastric bypass Sexual abuse: No  BOWEL MOVEMENT: no issues Pain with bowel movement: No  URINATION: Pain with urination: No Fully empty bladder: Yes:   Stream: Strong Urgency: No Frequency: average Leakage: none  INTERCOURSE:  Ability to have vaginal  penetration Yes  Pain with intercourse: After Intercourse and feel the burning Dryness; Yes  Climax: hard to climax and has to play with herself during [facility] Scale: 1/3  PREGNANCY: Vaginal deliveries 1 Tearing Yes: 5 stitches Episiotomy No   PROLAPSE: None   OBJECTIVE:  Note: Objective measures were completed at Evaluation unless otherwise noted.  DIAGNOSTIC FINDINGS:  none  PATIENT SURVEYS:  PFIQ-7: 28 POPIQ-7: 28  COGNITION: Overall cognitive status: Within functional limits for tasks assessed     SENSATION: Light touch: Deficits around the vulva and posterior vaginal wall    FUNCTIONAL TESTS:  SLS hold 10 sec but increased trunk sway Single leg squat harder on the right and able to go 1/4 down  GAIT: Assistive device utilized: None Comments: no deficits   POSTURE: rounded shoulders and forward head   LUMBARAROM/PROM: lumbar ROM is full   LOWER EXTREMITY ROM: bilateral hip ROM is full   LOWER EXTREMITY MMT:  MMT Right eval Left eval  Hip abduction 3+/5 3+/5   (Blank rows = not tested) PALPATION:   Pelvic Alignment: ASIS are equal  Abdominal: difficulty with contracting the lower abdominal area                External Perineal Exam: dryness                             Internal Pelvic Floor: burning sensation at the iliococcygeus going halfway back  Patient confirms identification and approves PT to assess internal pelvic floor and treatment Yes No emotional/communication barriers or cognitive limitation. Patient is motivated to learn. Patient understands and agrees with treatment goals and plan. PT explains patient will be examined in standing, sitting, and lying down to see how their muscles and joints work. When they are ready, they will be asked to remove their underwear so PT can examine their perineum. The patient is also given the option  of providing their own chaperone as one is not provided in our facility. The patient also has the right  and is explained the right to defer or refuse any part of the evaluation or treatment including the internal exam. With the patient's consent, PT will use one gloved finger to gently assess the muscles of the pelvic floor, seeing how well it contracts and relaxes and if there is muscle symmetry. After, the patient will get dressed and PT and patient will discuss exam findings and plan of care. PT and patient discuss plan of care, schedule, attendance policy and HEP activities.   PELVIC MMT:   MMT eval  Vaginal 3/5 anterior and posterior; 2/5 on sides; not circular contraction  (Blank rows = not tested)        TONE: Average pelvic floor tone  PROLAPSE: none  TODAY'S TREATMENT:          03/13/24 Review of progress and HEP Neurom reed- diaphragmatic breathing reed with thera band around lower ribs for feedback  Patient confirms identification and approves physical therapist to perform internal soft tissue work       BP prior to therapy: 132/73, pulse 96 Neuromuscular re-education: Down training: Diaphragmatic breathing in supine, quadruped, and sitting with tactile cues to no lift the chest, place air in the abdomen and feel the pelvic floor relax Exercises: Stretches/mobility: Cat camel 15 x  Childs pose holding 30 sec  Hip adductor stretch in sitting 2 x 30 sec Pigeon pose holding 30 sec bil.  Z stretch holding for 30 sec each side                                                                                                                        DATE: 01/12/24  EVAL Examination completed, findings reviewed, pt educated on POC, HEP, and female pelvic floor anatomy, reasoning with pelvic floor assessment internally with pt consent, and . Pt motivated to participate in PT and agreeable to attempt recommendations.     PATIENT EDUCATION:  03/13/24 Education details: .Access Code: [email or web address]  Person educated: Patient Education method: Explanation, Demonstration, Tactile cues,  Verbal cues, and Handouts Education comprehension: verbalized understanding, returned demonstration, verbal cues required, tactile cues required, and needs further education  HOME EXERCISE PROGRAM: 03/13/24 Access Code: [email or web address] URL: [email or web address] Date: 03/13/2024 Prepared by: Channing Pereyra  Exercises - Supine Diaphragmatic Breathing  - 1 x daily - 7 x weekly - 1 sets - 10 reps - Seated Diaphragmatic Breathing  - 1 x daily - 7 x weekly - 1 sets - 10 reps - Quadruped Diaphragmatic Breathing  - 1 x daily - 7 x weekly - 1 sets - 10 reps - Cat Cow  - 1 x daily - 7 x weekly - 1 sets - 10 reps - Diaphragmatic Breathing in Child's Pose with Pelvic Floor Relaxation  - 1 x daily - 7 x weekly - 1 sets - 2 reps - 30 sec hold - V Sit Hip Adductor Hamstring  Stretch  - 1 x daily - 7 x weekly - 1 sets - 2 reps - 30 sec hold - Pigeon Pose  - 1 x daily - 7 x weekly - 1 sets - 2 reps - 30 sec hold - Posterior Chain Stretch  - 1 x daily - 7 x weekly - 1 sets - 2 reps - 30 sec hold - Sidelying Reverse Clamshell  - 1 x daily - 7 x weekly - 1 sets - 10 reps  ASSESSMENT:  CLINICAL IMPRESSION: Patient was seen today for treatment of pelvic floor dysfunction. Patient with good pelvic floor strength and coordination, mild dryness externally. Patient did well with exercises, manual therapy and education today. We discussed progress and recommended cont stretches with diaphragmatic breathing . Treatment session focused on education on relevant anatomy  to improve orgasm and to educate her about pain with periods and getting a referral for OBGYN. Patient had no  difficulty with contraction and relaxation of pelvic floor. Patient is progressing well towards goals and will benefit from continued PT to address deficits, reduce pelvic floor dysfunction and improve quality of life.         Patient is a 33 y.o. female who was seen today for physical therapy  treatment for pelvic dysfunction. Patient  had Transverse Myitis and still has a small amount of inflammation of her spine.  Patient has not been to therapy for several weeks due to going to the hospital, childcare, and not remembering the appointment. Patient has had the pain 2 times this month. She was relaxed after all the stretching. No internal work due to being on her cycle. Patient has some difficulty with diaphragmatic breathing with the air traveling to the pelvic floor. Patient will benefit from skilled therapy to reduce pain and improve function.   OBJECTIVE IMPAIRMENTS: decreased coordination, decreased endurance, decreased strength, increased fascial restrictions, and pain.   ACTIVITY LIMITATIONS: carrying, lifting, bending, sitting, standing, squatting, and locomotion level  PARTICIPATION LIMITATIONS: meal prep, cleaning, laundry, and community activity  PERSONAL FACTORS: 1 comorbidity: transverse Myitis are also affecting patient's functional outcome.   REHAB POTENTIAL: Good  CLINICAL DECISION MAKING: Evolving/moderate complexity  EVALUATION COMPLEXITY: Moderate   GOALS: Goals reviewed with patient? Yes  SHORT TERM GOALS: Target date: 02/09/24  Patient independent with initial HEP.  Baseline:not educated yet Goal status: INITIAL   LONG TERM GOALS: Target date: 06/14/23  Patient is independent with her HEP for strength and pain reduction to improve quality of life.  Baseline: not educated yet Goal status: INITIAL  2.  Patient reports the vaginal burning has decreased >/= 75% so she is able to socialize in the community.  Baseline: burning pain is 4/10 Goal status: INITIAL  3.  Patient is able to run with vaginal burning decreased </= 1/10 so she is able to continue to exercise.  Baseline: burning is 4/10 Goal status: INITIAL  4.  Patient is able to have vaginal penetration with burning pain </= 1/10.  Baseline: burning is 4/10 Goal status: INITIAL   PLAN:  PT FREQUENCY: 1x/week  PT DURATION:  other: 5 months  PLANNED INTERVENTIONS: 97110-Therapeutic exercises, 97530- Therapeutic activity, 97112- Neuromuscular re-education, 97535- Self Care, 02859- Manual therapy, 20560 (1-2 muscles), 20561 (3+ muscles)- Dry Needling, Patient/Family education, and Biofeedback  PLAN FOR NEXT SESSION: manual work to the vaginal wall and posteriorly, vaginal contraction, lower abdominal contraction, need auth   Cyd Hostler, PT 03/21/24 11:03 AM

## 2024-03-27 ENCOUNTER — Ambulatory Visit: Admitting: Family Medicine

## 2024-03-28 NOTE — Progress Notes (Unsigned)
   SUBJECTIVE:   CHIEF COMPLAINT / HPI:  Geneal D Bhullar is a 33 y.o. female with a pertinent past medical history of transverse myelitis, GERD presenting to the clinic for continuing abdominal discomfort and new throat pain.  GERD, Hx H. pylori, abdominal discomfort Patient presents today reporting persistent abdominal symptoms. Patient continues to have reflux while lying flat in bed, which causes burning in the back of her throat. The denies burping or increased flatulence. Patient also reports occasional upper abdominal cramping. She reports that she has increased vaginal discharge, denies dysuria. Patient has been abstinent from sexual activity from several months, has regular periods, and is confident she is not pregnant.  Has previously had negative STD test after beginning abstinence. She notes regular bowel movements. No hematochezia. Denies nausea/vomiting. No fevers or chills.  See further HPI in clinic note 02/21/24.  H. pylori negative on 10/8. Patient was also treated with metronidazole  500 mg BID x 7 days empirically for BV. Provided Diflucan  x 2 for history of yeast infection after antibiotic treatment.  Patient returns now reporting the same symptoms.   PERTINENT PMH / PSH: GERD Transverse myelitis March 2025  *Remainder reviewed in problem list.   OBJECTIVE:   There were no vitals taken for this visit. Physical Exam General: Age-appropriate, resting comfortably in chair, NAD, alert and at baseline. HEENT:  Head: Normocephalic, atraumatic. No tenderness to percussion over sinuses. Eyes: PERRLA. No conjunctival erythema or scleral injections. Ears: TMs non-bulging and non-erythematous bilaterally. No erythema of external ear canal. No cerumen impaction. Nose: Non-erythematous turbinates. No rhinorrhea. Mouth/Oral: Clear, no tonsillar exudate. MMM. Neck: Supple. No LAD. Cardiovascular: Regular rate and rhythm. Normal S1/S2. No murmurs, rubs, or gallops  appreciated. 2+ radial pulses. Pulmonary: Clear bilaterally to ascultation. No wheezes, crackles, or rhonchi. Normal WOB on room air. No accessory muscle use. Abdominal: No tenderness to deep or light palpation. No rebound or guarding. No HSM. Skin: Warm and dry. Extremities: No peripheral edema bilaterally. Capillary refill <2 seconds.  Pelvic exam:  Vulva: Normal appearing vulva with no masses, tenderness or lesions, ***. Vagina: Normal appearing vagina with normal color, no lesions, with {GYN VAGINAL DISCHARGE:21986} discharge present, ***. Cervix: No lesions, {GYN VAGINAL DISCHARGE:21986} discharge present. *Chaperone ***, CMA present for pelvic exam.  No results found for this or any previous visit (from the past 48 hours).     01/01/2024    9:49 AM  Depression screen PHQ 2/9  Decreased Interest 0  Down, Depressed, Hopeless 0  PHQ - 2 Score 0  Altered sleeping 0  Tired, decreased energy 0  Change in appetite 0  Feeling bad or failure about yourself  0  Trouble concentrating 0  Moving slowly or fidgety/restless 0  Suicidal thoughts 0  PHQ-9 Score 0      Data saved with a previous flowsheet row definition     ASSESSMENT/PLAN:   Assessment & Plan   No follow-ups on file.  Bryam Taborda Toma, MD [facility]

## 2024-03-29 ENCOUNTER — Ambulatory Visit (INDEPENDENT_AMBULATORY_CARE_PROVIDER_SITE_OTHER): Admitting: Family Medicine

## 2024-03-29 ENCOUNTER — Encounter: Payer: Self-pay | Admitting: Family Medicine

## 2024-03-29 ENCOUNTER — Other Ambulatory Visit (HOSPITAL_COMMUNITY)
Admission: RE | Admit: 2024-03-29 | Discharge: 2024-03-29 | Disposition: A | Discharge status: Home / Self Care | Source: Ambulatory Visit | Attending: Family Medicine | Admitting: Family Medicine

## 2024-03-29 VITALS — BP 115/80 | HR 67 | Temp 97.7°F | Ht 65.0 in | Wt 166.8 lb

## 2024-03-29 DIAGNOSIS — N898 Other specified noninflammatory disorders of vagina: Secondary | ICD-10-CM

## 2024-03-29 DIAGNOSIS — R109 Unspecified abdominal pain: Secondary | ICD-10-CM | POA: Diagnosis not present

## 2024-03-29 DIAGNOSIS — J069 Acute upper respiratory infection, unspecified: Secondary | ICD-10-CM | POA: Diagnosis not present

## 2024-03-29 LAB — POCT WET PREP (WET MOUNT)
Clue Cells Wet Prep Whiff POC: NEGATIVE
Trichomonas Wet Prep HPF POC: ABSENT

## 2024-03-29 LAB — POC SOFIA 2 FLU + SARS ANTIGEN FIA
Influenza A, POC: NEGATIVE
Influenza B, POC: NEGATIVE
SARS Coronavirus 2 Ag: NEGATIVE

## 2024-03-29 LAB — POCT URINE PREGNANCY: Preg Test, Ur: NEGATIVE

## 2024-03-29 NOTE — Assessment & Plan Note (Signed)
 Unclear etiology at this point.  Has undergone extensive workup.  Negative for H. pylori previously.  Has been empirically treated for BV and yeast infection.  Now does have new vaginal discharge.  Wonder if abdominal/pelvic symptoms may be related to leiomyoma or ovarian cyst.  Reassuringly, no family history of ovarian cancer or recent weight loss. - Wet prep unremarkable today - POCT urine pregnancy negative - Follow-up GC/chlamydia probe - Scheduled transpelvic ultrasound

## 2024-03-29 NOTE — Patient Instructions (Signed)
 It was great to see you today! Thank you for choosing [facility] for your primary care.  Today we addressed: Viral upper respiratory tract infection You are negative for COVID and flu.  Vaginal discharge Abdominal discomfort You do not have BV or a yeast infection.  I will call if your other STI tests are unusual.  Your urine pregnancy test is negative.  I wonder if you may have a uterine fibroid or an ovarian cyst.  I have ordered a transpelvic ultrasound which has been scheduled for 11/19.  We will let you know the results after the scan.  You should return to our clinic if your symptoms change or are not improving.  Thank you for coming to see us  at [facility] and for the opportunity to care for you! Toma, Melanie Rennaker, MD 03/29/2024, 12:19 PM

## 2024-04-01 ENCOUNTER — Ambulatory Visit (HOSPITAL_COMMUNITY): Payer: Self-pay

## 2024-04-01 LAB — GC/CHLAMYDIA PROBE AMP (~~LOC~~) NOT AT ARMC
Chlamydia: NEGATIVE
Comment: NEGATIVE
Comment: NORMAL
Neisseria Gonorrhea: NEGATIVE

## 2024-04-02 ENCOUNTER — Ambulatory Visit
Admission: RE | Admit: 2024-04-02 | Discharge: 2024-04-02 | Disposition: A | Discharge status: Home / Self Care | Payer: Self-pay | Source: Ambulatory Visit | Attending: Urgent Care | Admitting: Urgent Care

## 2024-04-02 ENCOUNTER — Telehealth: Payer: Self-pay

## 2024-04-02 ENCOUNTER — Ambulatory Visit: Payer: Self-pay | Admitting: Family Medicine

## 2024-04-02 VITALS — BP 120/72 | HR 89 | Temp 99.2°F | Resp 18

## 2024-04-02 DIAGNOSIS — J018 Other acute sinusitis: Secondary | ICD-10-CM | POA: Diagnosis not present

## 2024-04-02 MED ORDER — DOXYCYCLINE HYCLATE 100 MG PO CAPS: 100.0000 mg | ORAL_CAPSULE | Freq: Two times a day (BID) | ORAL | 0 refills | Status: DC

## 2024-04-02 MED ORDER — CETIRIZINE HCL 10 MG PO TABS: 10.0000 mg | ORAL_TABLET | Freq: Every day | ORAL | 0 refills | Status: AC

## 2024-04-02 MED ORDER — PROMETHAZINE-DM 6.25-15 MG/5ML PO SYRP: 5.0000 mL | ORAL_SOLUTION | Freq: Three times a day (TID) | ORAL | 0 refills | Status: DC | PRN

## 2024-04-02 MED ORDER — PSEUDOEPHEDRINE HCL 30 MG PO TABS: 30.0000 mg | ORAL_TABLET | Freq: Three times a day (TID) | ORAL | 0 refills | Status: DC | PRN

## 2024-04-02 MED ORDER — CETIRIZINE HCL 10 MG PO TABS: 10.0000 mg | ORAL_TABLET | Freq: Every day | ORAL | 0 refills | Status: DC

## 2024-04-02 NOTE — Discharge Instructions (Signed)
 We will manage this as a sinus infection with doxycycline. For sore throat or cough try using a honey-based tea. Use 3 teaspoons of honey with juice squeezed from half lemon. Place shaved pieces of ginger into 1/2-1 cup of water and warm over stove top. Then mix the ingredients and repeat every 4 hours as needed. Please take ibuprofen 600mg  every 6 hours with food alternating with OR taken together with Tylenol 500mg -650mg  every 6 hours for throat pain, fevers, aches and pains. Hydrate very well with at least 2 liters of water. Eat light meals such as soups (chicken and noodles, vegetable, chicken and wild rice).  Do not eat foods that you are allergic to.  Taking an antihistamine like Zyrtec can help against postnasal drainage, sinus congestion which can cause sinus pain, sinus headaches, throat pain, painful swallowing, coughing.  You can take this together with pseudoephedrine (Sudafed) at a dose of 30 mg 3 times a day or twice daily as needed for the same kind of nasal drip, congestion.  Use cough medication as needed.

## 2024-04-02 NOTE — Telephone Encounter (Signed)
 Medicatiosn sent first time at 11:34 AM and resent. Contacted pt and states the phramacy [facility] at [place] has her prescriptions now.

## 2024-04-02 NOTE — ED Triage Notes (Signed)
 Pt reports cough, chills, body aches, headache sore throat, slight ear aches  x 1 week Negative Covid and flu. Pt concern for ear infection and  pneumonia. Otc meds, Tylenol  and tea gives no relief.

## 2024-04-02 NOTE — ED Provider Notes (Signed)
 [facility]  Note:  This document was prepared using Conservation officer, historic buildings and may include unintentional dictation errors.  MRN: [id number] DOB: 03/11/1991  Subjective:   Melanie Cisneros is a 33 y.o. female presenting for 1 week history of persistent coughing, chills, body pains, sinus pain, sinus headaches, pressure around her eyes, throat pain, bilateral mild ear pains.  Did a COVID test and flu test and both were negative.  No chest pain, shortness of breath or wheezing.  However, wants to make sure that she does not have pneumonia.  No history of asthma.  No smoking of any kind including cigarettes, cigars, vaping, marijuana use.    No current facility-administered medications for this encounter.  Current Outpatient Medications:    Multiple Vitamins-Minerals (BARIATRIC MULTIVITAMINS PO), Take 1 tablet by mouth daily., Disp: , Rfl:    pantoprazole  (PROTONIX ) 40 MG tablet, TAKE 1 TABLET BY MOUTH EVERY DAY, Disp: 90 tablet, Rfl: 1   Allergies  Allergen Reactions   Amoxil [Amoxicillin] Anaphylaxis and Hives   Penicillins Anaphylaxis, Hives and Other (See Comments)   Pork Allergy Other (See Comments)    Pt has tolerated multiple doses of heparin    Past Medical History:  Diagnosis Date   Bacterial vaginosis    BMI 45.0-49.9, adult (HCC)    Chlamydia    Dizziness    Facial numbness    Fullness in head    Gonorrhea    History of gastric bypass    Hypokalemia    Lightheadedness    Obesity    Ovarian cyst    Palpitations    Psychosocial stressors    Ptyalism    Rubella non-immune status, antepartum    UTI (lower urinary tract infection)      Past Surgical History:  Procedure Laterality Date   GASTRIC BYPASS     WISDOM TOOTH EXTRACTION      Family History  Problem Relation Age of Onset   Diabetes Father    Hypertension Mother     Social History   Tobacco Use   Smoking status: Never   Smokeless tobacco: Never  Vaping Use    Vaping status: Never Used  Substance Use Topics   Alcohol use: Not Currently   Drug use: No    ROS   Objective:   Vitals: BP 120/72 (BP Location: Left Arm)   Pulse 89   Temp 99.2 F (37.3 C) (Oral)   Resp 18   LMP  (Within Weeks) Comment: 2-3 weeks  SpO2 96%   Physical Exam Constitutional:      General: She is not in acute distress.    Appearance: Normal appearance. She is well-developed and normal weight. She is not ill-appearing, toxic-appearing or diaphoretic.  HENT:     Head: Normocephalic and atraumatic.     Right Ear: Tympanic membrane, ear canal and external ear normal. No drainage or tenderness. No middle ear effusion. There is no impacted cerumen. Tympanic membrane is not erythematous or bulging.     Left Ear: Tympanic membrane, ear canal and external ear normal. No drainage or tenderness.  No middle ear effusion. There is no impacted cerumen. Tympanic membrane is not erythematous or bulging.     Nose: Nose normal. No congestion or rhinorrhea.     Mouth/Throat:     Mouth: Mucous membranes are moist. No oral lesions.     Pharynx: No pharyngeal swelling, oropharyngeal exudate, posterior oropharyngeal erythema or uvula swelling.     Tonsils: No  tonsillar exudate or tonsillar abscesses.  Eyes:     General: No scleral icterus.       Right eye: No discharge.        Left eye: No discharge.     Extraocular Movements: Extraocular movements intact.     Right eye: Normal extraocular motion.     Left eye: Normal extraocular motion.     Conjunctiva/sclera: Conjunctivae normal.  Cardiovascular:     Rate and Rhythm: Normal rate and regular rhythm.     Heart sounds: Normal heart sounds. No murmur heard.    No friction rub. No gallop.  Pulmonary:     Effort: Pulmonary effort is normal. No respiratory distress.     Breath sounds: No stridor. No wheezing, rhonchi or rales.  Chest:     Chest wall: No tenderness.  Musculoskeletal:     Cervical back: Normal range of motion and  neck supple.  Lymphadenopathy:     Cervical: No cervical adenopathy.  Skin:    General: Skin is warm and dry.  Neurological:     General: No focal deficit present.     Mental Status: She is alert and oriented to person, place, and time.     Cranial Nerves: No cranial nerve deficit.     Motor: No weakness.     Coordination: Coordination normal.     Gait: Gait normal.  Psychiatric:        Mood and Affect: Mood normal.        Behavior: Behavior normal.        Thought Content: Thought content normal.        Judgment: Judgment normal.     Assessment and Plan :   PDMP not reviewed this encounter.  1. Acute non-recurrent sinusitis of other sinus    Deferred imaging given clear cardiopulmonary exam, hemodynamically stable vital signs.  Will start empiric treatment for sinusitis with doxycycline  given her medication allergies.  Recommended supportive care otherwise. Counseled patient on potential for adverse effects with medications prescribed/recommended today, ER and return-to-clinic precautions discussed, patient verbalized understanding.    Christopher Savannah, PA-C 04/02/24 1243

## 2024-04-03 ENCOUNTER — Ambulatory Visit (HOSPITAL_COMMUNITY): Attending: Family Medicine

## 2024-04-26 ENCOUNTER — Inpatient Hospital Stay: Admission: RE | Admit: 2024-04-26 | Source: Ambulatory Visit

## 2024-05-01 ENCOUNTER — Telehealth

## 2024-05-01 DIAGNOSIS — T3695XA Adverse effect of unspecified systemic antibiotic, initial encounter: Secondary | ICD-10-CM | POA: Diagnosis not present

## 2024-05-01 DIAGNOSIS — B379 Candidiasis, unspecified: Secondary | ICD-10-CM

## 2024-05-01 MED ORDER — FLUCONAZOLE 150 MG PO TABS: 150.0000 mg | ORAL_TABLET | ORAL | 0 refills | Status: DC | PRN

## 2024-05-01 NOTE — Patient Instructions (Signed)
°  Melanie Cisneros, thank you for joining Melanie CHRISTELLA Dickinson, PA-C for today's virtual visit.  While this provider is not your primary care provider (PCP), if your PCP is located in our provider database this encounter information will be shared with them immediately following your visit.   A [facility] MyChart account gives you access to today's visit and all your visits, tests, and labs performed at [facility]  click here if you don't have a [facility] MyChart account or go to [email or web address]  Consent: (Patient) Melanie Cisneros provided verbal consent for this virtual visit at the beginning of the encounter.  Current Medications:  Current Outpatient Medications:    fluconazole  (DIFLUCAN ) 150 MG tablet, Take 1 tablet (150 mg total) by mouth every 3 (three) days as needed., Disp: 3 tablet, Rfl: 0   cetirizine  (ZYRTEC  ALLERGY) 10 MG tablet, Take 1 tablet (10 mg total) by mouth daily., Disp: 30 tablet, Rfl: 0   doxycycline  (VIBRAMYCIN ) 100 MG capsule, Take 1 capsule (100 mg total) by mouth 2 (two) times daily., Disp: 20 capsule, Rfl: 0   Multiple Vitamins-Minerals (BARIATRIC MULTIVITAMINS PO), Take 1 tablet by mouth daily., Disp: , Rfl:    pantoprazole  (PROTONIX ) 40 MG tablet, TAKE 1 TABLET BY MOUTH EVERY DAY, Disp: 90 tablet, Rfl: 1   promethazine -dextromethorphan  (PROMETHAZINE -DM) 6.25-15 MG/5ML syrup, Take 5 mLs by mouth 3 (three) times daily as needed for cough., Disp: 200 mL, Rfl: 0   pseudoephedrine  (SUDAFED) 30 MG tablet, Take 1 tablet (30 mg total) by mouth every 8 (eight) hours as needed for congestion., Disp: 30 tablet, Rfl: 0   Medications ordered in this encounter:  Meds ordered this encounter  Medications   fluconazole  (DIFLUCAN ) 150 MG tablet    Sig: Take 1 tablet (150 mg total) by mouth every 3 (three) days as needed.    Dispense:  3 tablet    Refill:  0    Supervising Provider:   LAMPTEY, PHILIP O [[id number]]     *If you need refills on other  medications prior to your next appointment, please contact your pharmacy*  Follow-Up: Call back or seek an in-person evaluation if the symptoms worsen or if the condition fails to improve as anticipated.  [facility] [phone]  Other Instructions  Echinacea essential oil Eucalyptus essential oil   If you have been instructed to have an in-person evaluation today at a local Urgent Care facility, please use the link below. It will take you to a list of all of our available [facility], including address, phone number and hours of operation. Please do not delay care.  [facility]  If you or a family member do not have a primary care provider, use the link below to schedule a visit and establish care. When you choose a [facility] primary care physician or advanced practice provider, you gain a long-term partner in health. Find a Primary Care Provider  Learn more about [facility] in-office and virtual care options:  - Get Care Now

## 2024-05-01 NOTE — Progress Notes (Signed)
 Virtual Visit Consent   Melanie Cisneros, you are scheduled for a virtual visit with a [facility] provider today. Just as with appointments in the office, your consent must be obtained to participate. Your consent will be active for this visit and any virtual visit you may have with one of our providers in the next 365 days. If you have a MyChart account, a copy of this consent can be sent to you electronically.  As this is a virtual visit, video technology does not allow for your provider to perform a traditional examination. This may limit your provider's ability to fully assess your condition. If your provider identifies any concerns that need to be evaluated in person or the need to arrange testing (such as labs, EKG, etc.), we will make arrangements to do so. Although advances in technology are sophisticated, we cannot ensure that it will always work on either your end or our end. If the connection with a video visit is poor, the visit may have to be switched to a telephone visit. With either a video or telephone visit, we are not always able to ensure that we have a secure connection.  By engaging in this virtual visit, you consent to the provision of healthcare and authorize for your insurance to be billed (if applicable) for the services provided during this visit. Depending on your insurance coverage, you may receive a charge related to this service.  I need to obtain your verbal consent now. Are you willing to proceed with your visit today? Melanie Cisneros has provided verbal consent on 05/01/2024 for a virtual visit (video or telephone). Delon CHRISTELLA Dickinson, PA-C  Date: 05/01/2024 7:51 AM   Virtual Visit via Video Note   I, Delon CHRISTELLA Dickinson, connected with  Melanie Cisneros  ([id number], 01-Oct-1990) on 05/01/2024 at  7:45 AM EST by a video-enabled telemedicine application and verified that I am speaking with the correct person using two identifiers.  Location: Patient: Virtual  Visit Location Patient: Home Provider: Virtual Visit Location Provider: Home Office   I discussed the limitations of evaluation and management by telemedicine and the availability of in person appointments. The patient expressed understanding and agreed to proceed.    History of Present Illness: Melanie Cisneros is a 33 y.o. who identifies as a female who was assigned female at birth, and is being seen today for vaginal itching and irritation. Was just on ABx for a sinus infection and symptoms started after.   Problems:  Patient Active Problem List   Diagnosis Date Noted   Abdominal discomfort 03/29/2024   Vaginal discharge 07/17/2023   Constipation 07/17/2023   Anemia 07/16/2023   Ulnar neuropathy 05/24/2023   Paresthesia 05/18/2023   Orthostatic lightheadedness 09/13/2022   Hypokalemia 07/06/2022   Anxious mood 07/06/2022   Gonorrhea 11/09/2021   Ovarian cyst 11/09/2021   Bacterial vaginosis 05/13/2020   BMI 45.0-49.9, adult (HCC) 06/06/2016   Psychosocial stressors 02/25/2016   Ptyalism 02/25/2016   Rubella non-immune status, antepartum 01/27/2016    Allergies: Allergies[1] Medications: Current Medications[2]  Observations/Objective: Patient is well-developed, well-nourished in no acute distress.  Resting comfortably at home.  Head is normocephalic, atraumatic.  No labored breathing.  Speech is clear and coherent with logical content.  Patient is alert and oriented at baseline.    Assessment and Plan: 1. Antibiotic-induced yeast infection (Primary) - fluconazole  (DIFLUCAN ) 150 MG tablet; Take 1 tablet (150 mg total) by mouth every 3 (three) days as needed.  Dispense: 3 tablet;  Refill: 0  - Symptoms consistent with yeast infection following antibiotics - Fluconazole  prescribed - Limit bubble baths, scented lotions/soaps/detergents - Limit tight fitting clothing - Seek on person evaluation if not improving or if symptoms worsen   Follow Up Instructions: I  discussed the assessment and treatment plan with the patient. The patient was provided an opportunity to ask questions and all were answered. The patient agreed with the plan and demonstrated an understanding of the instructions.  A copy of instructions were sent to the patient via MyChart unless otherwise noted below.    The patient was advised to call back or seek an in-person evaluation if the symptoms worsen or if the condition fails to improve as anticipated.    Delon CHRISTELLA Dickinson, PA-C     [1]  Allergies Allergen Reactions   Amoxil [Amoxicillin] Anaphylaxis and Hives   Penicillins Anaphylaxis, Hives and Other (See Comments)   Pork Allergy Other (See Comments)    Pt has tolerated multiple doses of heparin  [2]  Current Outpatient Medications:    fluconazole  (DIFLUCAN ) 150 MG tablet, Take 1 tablet (150 mg total) by mouth every 3 (three) days as needed., Disp: 3 tablet, Rfl: 0   cetirizine  (ZYRTEC  ALLERGY) 10 MG tablet, Take 1 tablet (10 mg total) by mouth daily., Disp: 30 tablet, Rfl: 0   doxycycline  (VIBRAMYCIN ) 100 MG capsule, Take 1 capsule (100 mg total) by mouth 2 (two) times daily., Disp: 20 capsule, Rfl: 0   Multiple Vitamins-Minerals (BARIATRIC MULTIVITAMINS PO), Take 1 tablet by mouth daily., Disp: , Rfl:    pantoprazole  (PROTONIX ) 40 MG tablet, TAKE 1 TABLET BY MOUTH EVERY DAY, Disp: 90 tablet, Rfl: 1   promethazine -dextromethorphan  (PROMETHAZINE -DM) 6.25-15 MG/5ML syrup, Take 5 mLs by mouth 3 (three) times daily as needed for cough., Disp: 200 mL, Rfl: 0   pseudoephedrine  (SUDAFED) 30 MG tablet, Take 1 tablet (30 mg total) by mouth every 8 (eight) hours as needed for congestion., Disp: 30 tablet, Rfl: 0

## 2024-05-06 ENCOUNTER — Inpatient Hospital Stay: Admission: RE | Admit: 2024-05-06 | Discharge: 2024-05-06 | Attending: Family Medicine | Admitting: Family Medicine

## 2024-05-06 DIAGNOSIS — N898 Other specified noninflammatory disorders of vagina: Secondary | ICD-10-CM

## 2024-05-06 DIAGNOSIS — R109 Unspecified abdominal pain: Secondary | ICD-10-CM

## 2024-05-18 ENCOUNTER — Telehealth: Admitting: Nurse Practitioner

## 2024-05-18 ENCOUNTER — Other Ambulatory Visit: Payer: Self-pay | Admitting: Nurse Practitioner

## 2024-05-18 DIAGNOSIS — B379 Candidiasis, unspecified: Secondary | ICD-10-CM

## 2024-05-18 DIAGNOSIS — R309 Painful micturition, unspecified: Secondary | ICD-10-CM | POA: Diagnosis not present

## 2024-05-18 MED ORDER — NITROFURANTOIN MONOHYD MACRO 100 MG PO CAPS: 100.0000 mg | ORAL_CAPSULE | Freq: Two times a day (BID) | ORAL | 0 refills | Status: AC

## 2024-05-18 MED ORDER — FLUCONAZOLE 150 MG PO TABS: 150.0000 mg | ORAL_TABLET | Freq: Every day | ORAL | 0 refills | Status: DC

## 2024-05-18 NOTE — Progress Notes (Signed)
 " Virtual Visit Consent   Melanie Cisneros, you are scheduled for a virtual visit with a [facility] provider today. Just as with appointments in the office, your consent must be obtained to participate. Your consent will be active for this visit and any virtual visit you may have with one of our providers in the next 365 days. If you have a MyChart account, a copy of this consent can be sent to you electronically.  As this is a virtual visit, video technology does not allow for your provider to perform a traditional examination. This may limit your provider's ability to fully assess your condition. If your provider identifies any concerns that need to be evaluated in person or the need to arrange testing (such as labs, EKG, etc.), we will make arrangements to do so. Although advances in technology are sophisticated, we cannot ensure that it will always work on either your end or our end. If the connection with a video visit is poor, the visit may have to be switched to a telephone visit. With either a video or telephone visit, we are not always able to ensure that we have a secure connection.  By engaging in this virtual visit, you consent to the provision of healthcare and authorize for your insurance to be billed (if applicable) for the services provided during this visit. Depending on your insurance coverage, you may receive a charge related to this service.  I need to obtain your verbal consent now. Are you willing to proceed with your visit today? Melanie Cisneros has provided verbal consent on 05/18/2024 for a virtual visit (video or telephone). Melanie [name], NP  Date: 05/18/2024 6:08 PM   Virtual Visit via Video Note   I, Melanie Cisneros, connected with  Melanie Cisneros  ([id number], 07/31/1990) on 05/18/2024 at  6:00 PM EST by a video-enabled telemedicine application and verified that I am speaking with the correct person using two identifiers.  Location: Patient: Virtual Visit Location  Patient: Home Provider: Virtual Visit Location Provider: Home Office   I discussed the limitations of evaluation and management by telemedicine and the availability of in person appointments. The patient expressed understanding and agreed to proceed.    History of Present Illness: Melanie Cisneros is a 34 y.o. who identifies as a female who was assigned female at birth, and is being seen today for UTI symptoms.   Over the past several days she has been experiencing flank pain, dysuria, pelvic pressure and urinary frequency. She denies fever or hematuria.    Problems:  Patient Active Problem List   Diagnosis Date Noted   Abdominal discomfort 03/29/2024   Vaginal discharge 07/17/2023   Constipation 07/17/2023   Anemia 07/16/2023   Ulnar neuropathy 05/24/2023   Paresthesia 05/18/2023   Orthostatic lightheadedness 09/13/2022   Hypokalemia 07/06/2022   Anxious mood 07/06/2022   Gonorrhea 11/09/2021   Ovarian cyst 11/09/2021   Bacterial vaginosis 05/13/2020   BMI 45.0-49.9, adult (HCC) 06/06/2016   Psychosocial stressors 02/25/2016   Ptyalism 02/25/2016   Rubella non-immune status, antepartum 01/27/2016    Allergies: Allergies[1] Medications: Current Medications[2]  Observations/Objective: Patient is well-developed, well-nourished in no acute distress.  Resting comfortably at home.  Head is normocephalic, atraumatic.  No labored breathing.  Speech is clear and coherent with logical content.  Patient is alert and oriented at baseline.    Assessment and Plan: 1. Painful urination (Primary) - nitrofurantoin , macrocrystal-monohydrate, (MACROBID ) 100 MG capsule; Take 1 capsule (100 mg total)  by mouth 2 (two) times daily for 5 days.  Dispense: 10 capsule; Refill: 0    Follow Up Instructions: I discussed the assessment and treatment plan with the patient. The patient was provided an opportunity to ask questions and all were answered. The patient agreed with the plan and  demonstrated an understanding of the instructions.  A copy of instructions were sent to the patient via MyChart unless otherwise noted below.     The patient was advised to call back or seek an in-person evaluation if the symptoms worsen or if the condition fails to improve as anticipated.    Melanie [name], NP     [1]  Allergies Allergen Reactions   Amoxil [Amoxicillin] Anaphylaxis and Hives   Penicillins Anaphylaxis, Hives and Other (See Comments)   Pork Allergy Other (See Comments)    Pt has tolerated multiple doses of heparin  [2]  Current Outpatient Medications:    nitrofurantoin , macrocrystal-monohydrate, (MACROBID ) 100 MG capsule, Take 1 capsule (100 mg total) by mouth 2 (two) times daily for 5 days., Disp: 10 capsule, Rfl: 0   cetirizine  (ZYRTEC  ALLERGY) 10 MG tablet, Take 1 tablet (10 mg total) by mouth daily., Disp: 30 tablet, Rfl: 0   doxycycline  (VIBRAMYCIN ) 100 MG capsule, Take 1 capsule (100 mg total) by mouth 2 (two) times daily., Disp: 20 capsule, Rfl: 0   fluconazole  (DIFLUCAN ) 150 MG tablet, Take 1 tablet (150 mg total) by mouth every 3 (three) days as needed., Disp: 3 tablet, Rfl: 0   Multiple Vitamins-Minerals (BARIATRIC MULTIVITAMINS PO), Take 1 tablet by mouth daily., Disp: , Rfl:    pantoprazole  (PROTONIX ) 40 MG tablet, TAKE 1 TABLET BY MOUTH EVERY DAY, Disp: 90 tablet, Rfl: 1   promethazine -dextromethorphan  (PROMETHAZINE -DM) 6.25-15 MG/5ML syrup, Take 5 mLs by mouth 3 (three) times daily as needed for cough., Disp: 200 mL, Rfl: 0   pseudoephedrine  (SUDAFED) 30 MG tablet, Take 1 tablet (30 mg total) by mouth every 8 (eight) hours as needed for congestion., Disp: 30 tablet, Rfl: 0  "

## 2024-05-18 NOTE — Patient Instructions (Signed)
" °  Rashmi D Eichorn, thank you for joining [name], NP for today's virtual visit.  While this provider is not your primary care provider (PCP), if your PCP is located in our provider database this encounter information will be shared with them immediately following your visit.   A [facility] MyChart account gives you access to today's visit and all your visits, tests, and labs performed at [facility]  click here if you don't have a [facility] MyChart account or go to [email or web address]  Consent: (Patient) Melanie Cisneros provided verbal consent for this virtual visit at the beginning of the encounter.  Current Medications:  Current Outpatient Medications:    nitrofurantoin , macrocrystal-monohydrate, (MACROBID ) 100 MG capsule, Take 1 capsule (100 mg total) by mouth 2 (two) times daily for 5 days., Disp: 10 capsule, Rfl: 0   cetirizine  (ZYRTEC  ALLERGY) 10 MG tablet, Take 1 tablet (10 mg total) by mouth daily., Disp: 30 tablet, Rfl: 0   doxycycline  (VIBRAMYCIN ) 100 MG capsule, Take 1 capsule (100 mg total) by mouth 2 (two) times daily., Disp: 20 capsule, Rfl: 0   fluconazole  (DIFLUCAN ) 150 MG tablet, Take 1 tablet (150 mg total) by mouth every 3 (three) days as needed., Disp: 3 tablet, Rfl: 0   Multiple Vitamins-Minerals (BARIATRIC MULTIVITAMINS PO), Take 1 tablet by mouth daily., Disp: , Rfl:    pantoprazole  (PROTONIX ) 40 MG tablet, TAKE 1 TABLET BY MOUTH EVERY DAY, Disp: 90 tablet, Rfl: 1   promethazine -dextromethorphan  (PROMETHAZINE -DM) 6.25-15 MG/5ML syrup, Take 5 mLs by mouth 3 (three) times daily as needed for cough., Disp: 200 mL, Rfl: 0   pseudoephedrine  (SUDAFED) 30 MG tablet, Take 1 tablet (30 mg total) by mouth every 8 (eight) hours as needed for congestion., Disp: 30 tablet, Rfl: 0   Medications ordered in this encounter:  Meds ordered this encounter  Medications   nitrofurantoin , macrocrystal-monohydrate, (MACROBID ) 100 MG capsule    Sig: Take 1  capsule (100 mg total) by mouth 2 (two) times daily for 5 days.    Dispense:  10 capsule    Refill:  0    Supervising Provider:   BLAISE ALEENE KIDD [[id number]]     *If you need refills on other medications prior to your next appointment, please contact your pharmacy*  Follow-Up: Call back or seek an in-person evaluation if the symptoms worsen or if the condition fails to improve as anticipated.  [facility] [phone]  Other Instructions    If you have been instructed to have an in-person evaluation today at a local Urgent Care facility, please use the link below. It will take you to a list of all of our available [facility], including address, phone number and hours of operation. Please do not delay care.  [facility]  If you or a family member do not have a primary care provider, use the link below to schedule a visit and establish care. When you choose a [facility] primary care physician or advanced practice provider, you gain a long-term partner in health. Find a Primary Care Provider  Learn more about [facility] in-office and virtual care options:  - Get Care Now  "

## 2024-05-28 ENCOUNTER — Ambulatory Visit
Admission: RE | Admit: 2024-05-28 | Discharge: 2024-05-28 | Disposition: A | Discharge status: Home / Self Care | Source: Ambulatory Visit | Attending: Emergency Medicine | Admitting: Emergency Medicine

## 2024-05-28 VITALS — BP 122/83 | HR 72 | Temp 97.9°F | Resp 16

## 2024-05-28 DIAGNOSIS — N898 Other specified noninflammatory disorders of vagina: Secondary | ICD-10-CM | POA: Diagnosis present

## 2024-05-28 DIAGNOSIS — N760 Acute vaginitis: Secondary | ICD-10-CM | POA: Diagnosis not present

## 2024-05-28 MED ORDER — FLUCONAZOLE 150 MG PO TABS: ORAL_TABLET | ORAL | 0 refills | Status: DC

## 2024-05-28 NOTE — Discharge Instructions (Signed)
 Take 1 tablet of Diflucan  today and another tablet in 3 days if symptoms continue. We have sent a swab to check for yeast and BV and these results will return over the next few days.  We will call if we need to adjust any treatment.  Your results will also be available on your MyChart.

## 2024-05-28 NOTE — ED Provider Notes (Signed)
 " Melanie Cisneros    CSN: [id number] Arrival date & time: 05/28/24  1204      History   Chief Complaint Chief Complaint  Patient presents with   Abdominal Pain    Swab for yeast no pain just irritation not sexually active recently took antibiotics for a UTI - Entered by patient   Vaginitis    HPI Melanie Cisneros is a 34 y.o. female.   Patient presents with vaginal itching and irritation that began 3 days ago.  Patient reports that she recently took antibiotics for urinary tract infection and began to have some vaginal itching.  Patient states that she did take 1 tablet of Diflucan  that they had provided her with the prescription of antibiotics, but states that the itching and irritation returned.  Patient states that she normally needs at least 2 if not 3 doses of Diflucan  to help with this.  Patient denies being sexually active and therefore declines any STD testing today.  Patient would still like to have a swab performed to rule out yeast versus BV.  LMP 12/20.  The history is provided by the patient and medical records.  Abdominal Pain   Past Medical History:  Diagnosis Date   Bacterial vaginosis    BMI 45.0-49.9, adult (HCC)    Chlamydia    Dizziness    Facial numbness    Fullness in head    Gonorrhea    History of gastric bypass    Hypokalemia    Lightheadedness    Obesity    Ovarian cyst    Palpitations    Psychosocial stressors    Ptyalism    Rubella non-immune status, antepartum    UTI (lower urinary tract infection)     Patient Active Problem List   Diagnosis Date Noted   Abdominal discomfort 03/29/2024   Vaginal discharge 07/17/2023   Constipation 07/17/2023   Anemia 07/16/2023   Ulnar neuropathy 05/24/2023   Paresthesia 05/18/2023   Orthostatic lightheadedness 09/13/2022   Hypokalemia 07/06/2022   Anxious mood 07/06/2022   Gonorrhea 11/09/2021   Ovarian cyst 11/09/2021   Bacterial vaginosis 05/13/2020   BMI 45.0-49.9, adult (HCC)  06/06/2016   Psychosocial stressors 02/25/2016   Ptyalism 02/25/2016   Rubella non-immune status, antepartum 01/27/2016    Past Surgical History:  Procedure Laterality Date   GASTRIC BYPASS     WISDOM TOOTH EXTRACTION      OB History     Gravida  1   Para  1   Term  1   Preterm  0   AB  0   Living  1      SAB  0   IAB  0   Ectopic  0   Multiple  0   Live Births  1            Home Medications    Prior to Admission medications  Medication Sig Start Date End Date Taking? Authorizing Provider  fluconazole  (DIFLUCAN ) 150 MG tablet Take one tablet today and one tablet in 3 days if symptoms persist. 05/28/24  Yes Johnie Flaming A, NP  cetirizine  (ZYRTEC  ALLERGY) 10 MG tablet Take 1 tablet (10 mg total) by mouth daily. 04/02/24   Christopher Savannah, PA-C  Multiple Vitamins-Minerals (BARIATRIC MULTIVITAMINS PO) Take 1 tablet by mouth daily.    [provider]  pantoprazole  (PROTONIX ) 40 MG tablet TAKE 1 TABLET BY MOUTH EVERY DAY 01/23/24   Shitarev, Dimitry, MD  pseudoephedrine  (SUDAFED) 30 MG tablet Take 1  tablet (30 mg total) by mouth every 8 (eight) hours as needed for congestion. 04/02/24   Christopher Savannah, PA-C    Family History Family History  Problem Relation Age of Onset   Diabetes Father    Hypertension Mother     Social History Social History[1]   Allergies   Amoxil [amoxicillin], Penicillins, and Pork allergy   Review of Systems Review of Systems  Gastrointestinal:  Positive for abdominal pain.   Per HPI  Physical Exam Triage Vital Signs ED Triage Vitals  Encounter Vitals Group     BP 05/28/24 1215 122/83     Girls Systolic BP Percentile --      Girls Diastolic BP Percentile --      Boys Systolic BP Percentile --      Boys Diastolic BP Percentile --      Pulse Rate 05/28/24 1215 72     Resp 05/28/24 1215 16     Temp 05/28/24 1215 97.9 F (36.6 C)     Temp Source 05/28/24 1215 Oral     SpO2 05/28/24 1215 100 %     Weight --       Height --      Head Circumference --      Peak Flow --      Pain Score 05/28/24 1208 0     Pain Loc --      Pain Education --      Exclude from Growth Chart --    No data found.  Updated Vital Signs BP 122/83 (BP Location: Right Arm)   Pulse 72   Temp 97.9 F (36.6 C) (Oral)   Resp 16   LMP 05/04/2024 (Approximate)   SpO2 100%   Visual Acuity Right Eye Distance:   Left Eye Distance:   Bilateral Distance:    Right Eye Near:   Left Eye Near:    Bilateral Near:     Physical Exam Vitals and nursing note reviewed.  Constitutional:      General: She is awake. She is not in acute distress.    Appearance: Normal appearance. She is well-developed and well-groomed. She is not ill-appearing.  Genitourinary:    Comments: Exam deferred Neurological:     General: No focal deficit present.     Mental Status: She is alert and oriented to person, place, and time. Mental status is at baseline.  Psychiatric:        Behavior: Behavior is cooperative.      Cisneros Treatments / Results  Labs (all labs ordered are listed, but only abnormal results are displayed) Labs Reviewed  CERVICOVAGINAL ANCILLARY ONLY    EKG   Radiology No results found.  Procedures Procedures (including critical care time)  Medications Ordered in Cisneros Medications - No data to display  Initial Impression / Assessment and Plan / C[facility]neros Course  I have reviewed the triage vital signs and the nursing notes.  Pertinent labs & imaging results that were available during my care of the patient were reviewed by me and considered in my medical decision making (see chart for details).     Patient is overall well-appearing.  Vitals are stable.  GU exam deferred.  Patient perform self swab for BV/yeast.  Declined STD testing.  Empirically treating for yeast vaginitis with Diflucan .  Discussed follow-up and return precautions. Final Clinical Impressions(s) / Cisneros Diagnoses   Final diagnoses:  Acute vaginitis  Vaginal  itching     Discharge Instructions      Take 1 tablet  of Diflucan  today and another tablet in 3 days if symptoms continue. We have sent a swab to check for yeast and BV and these results will return over the next few days.  We will call if we need to adjust any treatment.  Your results will also be available on your MyChart.   ED Prescriptions     Medication Sig Dispense Auth. Provider   fluconazole  (DIFLUCAN ) 150 MG tablet Take one tablet today and one tablet in 3 days if symptoms persist. 2 tablet Johnie Flaming A, NP      PDMP not reviewed this encounter.    [1]  Social History Tobacco Use   Smoking status: Never   Smokeless tobacco: Never  Vaping Use   Vaping status: Never Used  Substance Use Topics   Alcohol use: Not Currently   Drug use: No     Johnie Flaming LABOR, NP 05/28/24 1223  "

## 2024-05-28 NOTE — ED Triage Notes (Addendum)
 Pt c/o vaginal irritation for 3 days. States she recently took abx for a UTI. Pt took 1 diflucan  on 1/3 but states she still feels like she might have yeast infection.

## 2024-05-29 ENCOUNTER — Telehealth: Payer: Self-pay

## 2024-05-29 LAB — CERVICOVAGINAL ANCILLARY ONLY
Bacterial Vaginitis (gardnerella): NEGATIVE
Candida Glabrata: NEGATIVE
Candida Vaginitis: NEGATIVE
Comment: NEGATIVE
Comment: NEGATIVE
Comment: NEGATIVE

## 2024-05-29 NOTE — Telephone Encounter (Signed)
 This RN returned patient's phone call at this time. Name and dob verified. Pt called voicing questions regarding if her results have came back from cytology swab collected on yesterday's visit. This RN reviewed her chart results. Cytology results are still in process. This was communicated to patient. She verbalized understanding. No further questions/concerns. Plans to keep an eye on her MyChart for results.

## 2024-06-19 ENCOUNTER — Ambulatory Visit: Payer: Self-pay

## 2024-06-20 ENCOUNTER — Telehealth: Admitting: Nurse Practitioner

## 2024-06-20 DIAGNOSIS — N760 Acute vaginitis: Secondary | ICD-10-CM

## 2024-06-20 DIAGNOSIS — B3731 Acute candidiasis of vulva and vagina: Secondary | ICD-10-CM | POA: Diagnosis not present

## 2024-06-20 DIAGNOSIS — B9689 Other specified bacterial agents as the cause of diseases classified elsewhere: Secondary | ICD-10-CM | POA: Diagnosis not present

## 2024-06-20 MED ORDER — FLUCONAZOLE 150 MG PO TABS: 150.0000 mg | ORAL_TABLET | Freq: Once | ORAL | 0 refills | Status: AC

## 2024-06-20 MED ORDER — METRONIDAZOLE 500 MG PO TABS: 500.0000 mg | ORAL_TABLET | Freq: Two times a day (BID) | ORAL | 0 refills | Status: AC

## 2024-06-20 NOTE — Progress Notes (Signed)
 " Virtual Visit Consent   Melanie Cisneros, you are scheduled for a virtual visit with a [facility] provider today. Just as with appointments in the office, your consent must be obtained to participate. Your consent will be active for this visit and any virtual visit you may have with one of our providers in the next 365 days. If you have a MyChart account, a copy of this consent can be sent to you electronically.  As this is a virtual visit, video technology does not allow for your provider to perform a traditional examination. This may limit your provider's ability to fully assess your condition. If your provider identifies any concerns that need to be evaluated in person or the need to arrange testing (such as labs, EKG, etc.), we will make arrangements to do so. Although advances in technology are sophisticated, we cannot ensure that it will always work on either your end or our end. If the connection with a video visit is poor, the visit may have to be switched to a telephone visit. With either a video or telephone visit, we are not always able to ensure that we have a secure connection.  By engaging in this virtual visit, you consent to the provision of healthcare and authorize for your insurance to be billed (if applicable) for the services provided during this visit. Depending on your insurance coverage, you may receive a charge related to this service.  I need to obtain your verbal consent now. Are you willing to proceed with your visit today? Kenishia D Marina has provided verbal consent on 06/20/2024 for a virtual visit (video or telephone). Comer LULLA Rouleau, NP  Date: 06/20/2024 1:48 PM   Virtual Visit via Video Note   I, Comer LULLA Rouleau, connected with  [name]  ([id number], [birth year]) on 06/20/24 at  1:30 PM EST by a video-enabled telemedicine application and verified that I am speaking with the correct person using two identifiers.  Location: Patient: Virtual Visit Location  Patient: Home Provider: Virtual Visit Location Provider: Home Office   I discussed the limitations of evaluation and management by telemedicine and the availability of in person appointments. The patient expressed understanding and agreed to proceed.    History of Present Illness: Melanie Cisneros is a 34 y.o. who identifies as a female who was assigned female at birth, and is being seen today for vaginal irritation.  Odor and feels irritated  No significant discharge  No itching, urinary symptoms, lesions, fevers, abdominal pain, abnormal bleeding She is not sexually active and not pregnant  [place] out of town recently to 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  Resting comfortably at home.  Head is normocephalic, atraumatic.  No labored breathing.  Speech is clear and coherent with logical content.  Patient is alert and oriented at  baseline.    Assessment and Plan: 1. Bacterial vaginosis (Primary) - metroNIDAZOLE  (FLAGYL ) 500 MG tablet; Take 1 tablet (500 mg total) by mouth 2 (two) times daily for 7 days.  Dispense: 14 tablet; Refill: 0  2. Vaginal candida - fluconazole  (DIFLUCAN ) 150 MG tablet; Take 1 tablet (150 mg total) by mouth once for 1  dose. May repeat in 72 hours  Dispense: 2 tablet; Refill: 0  Reported symptoms consistent with bacterial vaginosis. Rx sent for metronidazole  7 day course. Pt requested fluconazole  as she tends to get yeast infections with abx courses. Aware of s/s necessitating in person evaluation. Hygiene measures reviewed. Side effect profiles of medications reviewed.   Follow Up Instructions: I discussed the assessment and treatment plan with the patient. The patient was provided an opportunity to ask questions and all were answered. The patient agreed with the plan and demonstrated an understanding of the instructions.  A copy of instructions were sent to the patient via MyChart unless otherwise noted below.    The patient was advised to call back or seek an in-person evaluation if the symptoms worsen or if the condition fails to improve as anticipated.    Comer LULLA Rouleau, NP     [1]  Allergies Allergen Reactions   Amoxil [Amoxicillin] Anaphylaxis and Hives   Penicillins Anaphylaxis, Hives and Other (See Comments)   Pork Allergy Other (See Comments)    Pt has tolerated multiple doses of heparin  [2]  Current Outpatient Medications:    fluconazole  (DIFLUCAN ) 150 MG tablet, Take 1 tablet (150 mg total) by mouth once for 1 dose. May repeat in 72 hours, Disp: 2 tablet, Rfl: 0   metroNIDAZOLE  (FLAGYL ) 500 MG tablet, Take 1 tablet (500 mg total) by mouth 2 (two) times daily for 7 days., Disp: 14 tablet, Rfl: 0   cetirizine  (ZYRTEC  ALLERGY) 10 MG tablet, Take 1 tablet (10 mg total) by mouth daily., Disp: 30 tablet, Rfl: 0   Multiple Vitamins-Minerals (BARIATRIC MULTIVITAMINS PO), Take 1 tablet by mouth daily., Disp: , Rfl:   "

## 2024-06-20 NOTE — Patient Instructions (Addendum)
 " Melanie Cisneros, thank you for joining Comer Melanie Rouleau, NP for today's virtual visit.  While this provider is not your primary care provider (PCP), if your PCP is located in our provider database this encounter information will be shared with them immediately following your visit.   A [facility] MyChart account gives you access to today's visit and all your visits, tests, and labs performed at [facility]  click here if you don't have a [facility] MyChart account or go to [email or web address]  Consent: (Patient) Melanie Cisneros provided verbal consent for this virtual visit at the beginning of the encounter.  Current Medications:  Current Outpatient Medications:    fluconazole  (DIFLUCAN ) 150 MG tablet, Take 1 tablet (150 mg total) by mouth once for 1 dose. May repeat in 72 hours, Disp: 2 tablet, Rfl: 0   metroNIDAZOLE  (FLAGYL ) 500 MG tablet, Take 1 tablet (500 mg total) by mouth 2 (two) times daily for 7 days., Disp: 14 tablet, Rfl: 0   cetirizine  (ZYRTEC  ALLERGY) 10 MG tablet, Take 1 tablet (10 mg total) by mouth daily., Disp: 30 tablet, Rfl: 0   Multiple Vitamins-Minerals (BARIATRIC MULTIVITAMINS PO), Take 1 tablet by mouth daily., Disp: , Rfl:    Medications ordered in this encounter:  Meds ordered this encounter  Medications   metroNIDAZOLE  (FLAGYL ) 500 MG tablet    Sig: Take 1 tablet (500 mg total) by mouth 2 (two) times daily for 7 days.    Dispense:  14 tablet    Refill:  0   fluconazole  (DIFLUCAN ) 150 MG tablet    Sig: Take 1 tablet (150 mg total) by mouth once for 1 dose. May repeat in 72 hours    Dispense:  2 tablet    Refill:  0     *If you need refills on other medications prior to your next appointment, please contact your pharmacy*  Follow-Up: Call back or seek an in-person evaluation if the symptoms worsen or if the condition fails to improve as anticipated.   Other Instructions Based on what you shared with me it looks like you: May have a  vaginosis due to bacteria  Vaginosis is an inflammation of the vagina that can result in discharge, itching and pain. The cause is usually a change in the normal balance of vaginal bacteria or an infection. Vaginosis can also result from reduced estrogen levels after menopause.  The most common causes of vaginosis are:   Bacterial vaginosis which results from an overgrowth of one on several organisms that are normally present in your vagina.   Yeast infections which are caused by a naturally occurring fungus called candida.   Vaginal atrophy (atrophic vaginosis) which results from the thinning of the vagina from reduced estrogen levels after menopause.   Trichomoniasis which is caused by a parasite and is commonly transmitted by sexual intercourse.  Factors that increase your risk of developing vaginosis include: Medications, such as antibiotics and steroids Uncontrolled diabetes Use of hygiene products such as bubble bath, vaginal spray or vaginal deodorant Douching Wearing damp or tight-fitting clothing Using an intrauterine device (IUD) for birth control Hormonal changes, such as those associated with pregnancy, birth control pills or menopause Sexual activity Having a sexually transmitted infection  Your treatment plan is Metronidazole  or Flagyl  500mg  twice a day for 7 days.  I have electronically sent this prescription into the pharmacy that you have chosen. I also sent in fluconazole  150 mg tablet once for you to take if you develop  symptoms of a yeast infection. You can repeat this 72 hours after the first dose if symptoms do not resolve   Be sure to take all of the medication as directed. Stop taking any medication if you develop a rash, tongue swelling or shortness of breath. Mothers who are breast feeding should consider pumping and discarding their breast milk while on these antibiotics. However, there is no consensus that infant exposure at these doses would be harmful.   Remember that medication creams can weaken latex condoms.  HOME CARE: Good hygiene may prevent some types of vaginosis from recurring and may relieve some symptoms: Avoid baths, hot tubs and whirlpool spas. Rinse soap from your outer genital area after a shower, and dry the area well to prevent irritation. Don't use scented or harsh soaps, such as those with deodorant or antibacterial action. Avoid irritants. These include scented tampons and pads. Wipe from front to back after using the toilet. Doing so avoids spreading fecal bacteria to your vagina. Other things that may help prevent vaginosis include: Don't douche. Your vagina doesn't require cleansing other than normal bathing. Repetitive douching disrupts the normal organisms that reside in the vagina and can actually increase your risk of vaginal infection. Douching won't clear up a vaginal infection. Use a latex condom. Both female and female latex condoms may help you avoid infections spread by sexual contact. Wear cotton underwear. Also wear pantyhose with a cotton crotch. If you feel comfortable without it, skip wearing underwear to bed. Yeast thrives in [facility] Your symptoms should improve in the next day or two. GET HELP RIGHT AWAY IF:  You have pain in your lower abdomen ( pelvic area or over your ovaries) You develop nausea or vomiting You develop a fever Your discharge changes or worsens You have persistent pain with intercourse You develop shortness of breath, a rapid pulse, or you faint.    If you have been instructed to have an in-person evaluation today at a local Urgent Care facility, please use the link below. It will take you to a list of all of our available [facility], including address, phone number and hours of operation. Please do not delay care.  [facility]  If you or a family member do not have a primary care provider, use the link below to schedule a visit and establish  care. When you choose a [facility] primary care physician or advanced practice provider, you gain a long-term partner in health. Find a Primary Care Provider  Learn more about [facility]'s in-office and virtual care options: [facility] - Get Care Now  "

## 2024-06-21 ENCOUNTER — Ambulatory Visit: Payer: Self-pay

## 2024-07-01 ENCOUNTER — Ambulatory Visit: Admitting: Neurology
